# Patient Record
Sex: Male | Born: 1955 | Race: Black or African American | Hispanic: No | Marital: Single | Smoking: Former smoker
Health system: Southern US, Community
[De-identification: ages and names within clinical notes are randomized; demographics above are authoritative.]

## PROBLEM LIST (undated history)

## (undated) DIAGNOSIS — J45909 Unspecified asthma, uncomplicated: Secondary | ICD-10-CM

## (undated) DIAGNOSIS — D61818 Other pancytopenia: Secondary | ICD-10-CM

## (undated) DIAGNOSIS — H9190 Unspecified hearing loss, unspecified ear: Secondary | ICD-10-CM

## (undated) DIAGNOSIS — D869 Sarcoidosis, unspecified: Secondary | ICD-10-CM

## (undated) DIAGNOSIS — B029 Zoster without complications: Secondary | ICD-10-CM

## (undated) DIAGNOSIS — K219 Gastro-esophageal reflux disease without esophagitis: Secondary | ICD-10-CM

## (undated) DIAGNOSIS — F32A Depression, unspecified: Secondary | ICD-10-CM

## (undated) DIAGNOSIS — Z8669 Personal history of other diseases of the nervous system and sense organs: Secondary | ICD-10-CM

## (undated) DIAGNOSIS — D649 Anemia, unspecified: Secondary | ICD-10-CM

## (undated) DIAGNOSIS — E119 Type 2 diabetes mellitus without complications: Secondary | ICD-10-CM

## (undated) DIAGNOSIS — I739 Peripheral vascular disease, unspecified: Secondary | ICD-10-CM

## (undated) DIAGNOSIS — E782 Mixed hyperlipidemia: Secondary | ICD-10-CM

## (undated) DIAGNOSIS — Z8673 Personal history of transient ischemic attack (TIA), and cerebral infarction without residual deficits: Secondary | ICD-10-CM

## (undated) DIAGNOSIS — I10 Essential (primary) hypertension: Secondary | ICD-10-CM

## (undated) DIAGNOSIS — F329 Major depressive disorder, single episode, unspecified: Secondary | ICD-10-CM

## (undated) DIAGNOSIS — R569 Unspecified convulsions: Secondary | ICD-10-CM

## (undated) DIAGNOSIS — F419 Anxiety disorder, unspecified: Secondary | ICD-10-CM

## (undated) DIAGNOSIS — R0600 Dyspnea, unspecified: Secondary | ICD-10-CM

## (undated) DIAGNOSIS — I639 Cerebral infarction, unspecified: Secondary | ICD-10-CM

## (undated) HISTORY — PX: OTHER SURGICAL HISTORY: SHX169

## (undated) HISTORY — DX: Personal history of transient ischemic attack (TIA), and cerebral infarction without residual deficits: Z86.73

## (undated) HISTORY — DX: Essential (primary) hypertension: I10

## (undated) HISTORY — DX: Peripheral vascular disease, unspecified: I73.9

## (undated) HISTORY — DX: Gastro-esophageal reflux disease without esophagitis: K21.9

## (undated) HISTORY — DX: Sarcoidosis, unspecified: D86.9

## (undated) HISTORY — DX: Personal history of other diseases of the nervous system and sense organs: Z86.69

## (undated) HISTORY — DX: Other pancytopenia: D61.818

## (undated) HISTORY — DX: Mixed hyperlipidemia: E78.2

## (undated) HISTORY — DX: Type 2 diabetes mellitus without complications: E11.9

---
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
## 1983-02-22 HISTORY — PX: TIBIA FRACTURE SURGERY: SHX806

## 2000-02-19 ENCOUNTER — Encounter: Payer: Self-pay | Admitting: Emergency Medicine

## 2000-02-19 ENCOUNTER — Emergency Department (HOSPITAL_COMMUNITY): Admission: EM | Admit: 2000-02-19 | Discharge: 2000-02-19 | Payer: Self-pay | Admitting: Emergency Medicine

## 2009-02-21 HISTORY — PX: COLONOSCOPY: SHX174

## 2012-09-25 ENCOUNTER — Encounter: Payer: Self-pay | Admitting: Cardiovascular Disease

## 2012-09-25 DIAGNOSIS — R079 Chest pain, unspecified: Secondary | ICD-10-CM | POA: Diagnosis not present

## 2012-10-02 ENCOUNTER — Inpatient Hospital Stay (HOSPITAL_COMMUNITY): Payer: Medicare Other

## 2012-10-02 ENCOUNTER — Inpatient Hospital Stay (HOSPITAL_COMMUNITY)
Admission: AD | Admit: 2012-10-02 | Discharge: 2012-10-05 | DRG: 069 | Disposition: A | Discharge status: Home Health | Payer: Medicare Other | Source: Other Acute Inpatient Hospital | Attending: Internal Medicine | Admitting: Internal Medicine

## 2012-10-02 ENCOUNTER — Encounter (HOSPITAL_COMMUNITY): Payer: Self-pay | Admitting: Neurology

## 2012-10-02 DIAGNOSIS — Z7982 Long term (current) use of aspirin: Secondary | ICD-10-CM

## 2012-10-02 DIAGNOSIS — D869 Sarcoidosis, unspecified: Secondary | ICD-10-CM

## 2012-10-02 DIAGNOSIS — H913 Deaf nonspeaking, not elsewhere classified: Secondary | ICD-10-CM | POA: Diagnosis present

## 2012-10-02 DIAGNOSIS — E119 Type 2 diabetes mellitus without complications: Secondary | ICD-10-CM | POA: Diagnosis present

## 2012-10-02 DIAGNOSIS — G459 Transient cerebral ischemic attack, unspecified: Principal | ICD-10-CM | POA: Diagnosis present

## 2012-10-02 DIAGNOSIS — R209 Unspecified disturbances of skin sensation: Secondary | ICD-10-CM

## 2012-10-02 DIAGNOSIS — IMO0001 Reserved for inherently not codable concepts without codable children: Secondary | ICD-10-CM | POA: Diagnosis present

## 2012-10-02 DIAGNOSIS — I639 Cerebral infarction, unspecified: Secondary | ICD-10-CM | POA: Diagnosis present

## 2012-10-02 DIAGNOSIS — I6789 Other cerebrovascular disease: Secondary | ICD-10-CM

## 2012-10-02 DIAGNOSIS — E1165 Type 2 diabetes mellitus with hyperglycemia: Secondary | ICD-10-CM

## 2012-10-02 DIAGNOSIS — I6350 Cerebral infarction due to unspecified occlusion or stenosis of unspecified cerebral artery: Secondary | ICD-10-CM

## 2012-10-02 DIAGNOSIS — I10 Essential (primary) hypertension: Secondary | ICD-10-CM | POA: Diagnosis present

## 2012-10-02 DIAGNOSIS — Z79899 Other long term (current) drug therapy: Secondary | ICD-10-CM

## 2012-10-02 DIAGNOSIS — K219 Gastro-esophageal reflux disease without esophagitis: Secondary | ICD-10-CM | POA: Diagnosis present

## 2012-10-02 DIAGNOSIS — Z8673 Personal history of transient ischemic attack (TIA), and cerebral infarction without residual deficits: Secondary | ICD-10-CM

## 2012-10-02 DIAGNOSIS — IMO0002 Reserved for concepts with insufficient information to code with codable children: Secondary | ICD-10-CM

## 2012-10-02 DIAGNOSIS — I63239 Cerebral infarction due to unspecified occlusion or stenosis of unspecified carotid arteries: Secondary | ICD-10-CM

## 2012-10-02 HISTORY — DX: Cerebral infarction, unspecified: I63.9

## 2012-10-02 HISTORY — DX: Unspecified asthma, uncomplicated: J45.909

## 2012-10-02 LAB — CBC
Hemoglobin: 12.8 g/dL — ABNORMAL LOW (ref 13.0–17.0)
RBC: 4.26 MIL/uL (ref 4.22–5.81)
WBC: 7 10*3/uL (ref 4.0–10.5)

## 2012-10-02 LAB — CREATININE, SERUM
Creatinine, Ser: 1.09 mg/dL (ref 0.50–1.35)
GFR calc Af Amer: 85 mL/min — ABNORMAL LOW (ref >90)
GFR calc non Af Amer: 74 mL/min — ABNORMAL LOW (ref >90)

## 2012-10-02 LAB — GLUCOSE, CAPILLARY: Glucose-Capillary: 246 mg/dL — ABNORMAL HIGH (ref 70–99)

## 2012-10-02 LAB — TROPONIN I
Troponin I: <0.3 ng/mL (ref <0.30)
Troponin I: <0.3 ng/mL (ref <0.30)

## 2012-10-02 LAB — COMPREHENSIVE METABOLIC PANEL
ALT: 35 U/L (ref 0–53)
Albumin: 3.4 g/dL — ABNORMAL LOW (ref 3.5–5.2)
Alkaline Phosphatase: 75 U/L (ref 39–117)
Potassium: 3.6 mEq/L (ref 3.5–5.1)
Sodium: 144 mEq/L (ref 135–145)
Total Protein: 6.4 g/dL (ref 6.0–8.3)

## 2012-10-02 MED ORDER — INSULIN ASPART 100 UNIT/ML ~~LOC~~ SOLN
0.0000 [IU] | Freq: Three times a day (TID) | SUBCUTANEOUS | Status: DC
  Administered 2012-10-02: 3 [IU] via SUBCUTANEOUS
  Administered 2012-10-03: 1 [IU] via SUBCUTANEOUS
  Administered 2012-10-03: 2 [IU] via SUBCUTANEOUS
  Administered 2012-10-04 – 2012-10-05 (×2): 1 [IU] via SUBCUTANEOUS

## 2012-10-02 MED ORDER — ASPIRIN 325 MG PO TABS
325.0000 mg | ORAL_TABLET | Freq: Every day | ORAL | Status: DC
  Administered 2012-10-02 – 2012-10-05 (×4): 325 mg via ORAL
  Filled 2012-10-02 (×4): qty 1

## 2012-10-02 MED ORDER — ENOXAPARIN SODIUM 40 MG/0.4ML ~~LOC~~ SOLN
40.0000 mg | SUBCUTANEOUS | Status: DC
  Administered 2012-10-03 – 2012-10-05 (×3): 40 mg via SUBCUTANEOUS
  Filled 2012-10-02 (×5): qty 0.4

## 2012-10-02 MED ORDER — SENNOSIDES-DOCUSATE SODIUM 8.6-50 MG PO TABS: 1.0000 | ORAL_TABLET | Freq: Every evening | ORAL | Status: DC | PRN

## 2012-10-02 MED ORDER — ASPIRIN 300 MG RE SUPP
300.0000 mg | Freq: Every day | RECTAL | Status: DC
  Filled 2012-10-02 (×4): qty 1

## 2012-10-02 NOTE — Progress Notes (Signed)
Received the pt to room 4N24 from  [facility]. 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 Has weakness as well as numbness on left upper (flacid) and lower extremity from previous CVA. Vitals checked, informed about hospital protocol on fall prevention, oriented with call bell remote and room. Family at bed side. Will keep close monitoring. Danne Harbor

## 2012-10-02 NOTE — Consult Note (Signed)
Referring Physician: Susie Cassette    Chief Complaint: stroke  HPI:                                                                                                                                         Charles [name] is an 57 y.o. male treated in [place] in April for right hemispheric stroke. Family members do not have the medical records with them and are not versed in medical treatments thus cannot provide significant information. He was discharged on aspirin and Plavix and subsequently transferred to rehabilitation. He returned home and moved out to live with his mother in [place]. While at home one week ago he was complaining of left facial and arm numbness.  Mother states this is a "on and off thing". Patient was seen at [facility] on 8/5. At that the patient had a CT scan that showed chronic right MCA distribution changes with low attenuation in posterior aspect of the right frontal lobe and anterior right parietal lobe, suggestive of subacute ischemia. Patient also complained of some chest discomfort but denied any dizziness or lightheadedness.   Patient also has been having some left arm spasms which can be decreased by manual pressure or manipulation. In addition sensations of "shaking" in the back of his head".   As noted above, family is getting all the information from previous hospitalization as they are unsure what was done in April and feels there was a surgery presented to them in which a artery from "his brain could have been placed to reroute the blood flow".  Looking into records EC-IC bypass was mentioned but it was decided that this procedure would be reassessed in a out patient setting.  Per mother this was dicussed again with Dr. Jenkins Rouge and decided not to go this route. I have explained to them I am unsure of what they are talking about and look forward to seeing the noted from Dr. Jenkins Rouge of [facility] [place] tel: [phone], Fax: [phone]  Date last  known well: Unable to determine Time last known well: Unable to determine tPA Given: No: recent stroke  Past Medical History  Diagnosis Date  . Diabetes   . Hypertension   . Sarcoid   . Stroke     No past surgical history on file.  Family History  Problem Relation Age of Onset  . Diabetes Mother   . Diabetes Father   . Hypertension Mother   . Hypertension Father    Social History:  has no tobacco, alcohol, and drug history on file.  Allergies:  Allergies  Allergen Reactions  . Contrast Media (Iodinated Diagnostic Agents) Itching  . Shellfish Allergy Hives and Swelling    Medications:  Prior to Admission:  Prescriptions prior to admission  Medication Sig Dispense Refill  . acetaminophen (TYLENOL) 500 MG tablet Take 1,000 mg by mouth every 6 (six) hours as needed for pain.      [name] albuterol (PROVENTIL HFA;VENTOLIN HFA) 108 (90 BASE) MCG/ACT inhaler Inhale 2 puffs into the lungs every 6 (six) hours as needed for wheezing.      [name] aspirin EC 81 MG tablet Take 81 mg by mouth daily.      [name] atorvastatin (LIPITOR) 10 MG tablet Take 10 mg by mouth at bedtime.      . baclofen (LIORESAL) 10 MG tablet Take 10 mg by mouth daily.      . clopidogrel (PLAVIX) 75 MG tablet Take 75 mg by mouth daily.      . famotidine (PEPCID) 20 MG tablet Take 20 mg by mouth 2 (two) times daily.      . Fluticasone-Salmeterol (ADVAIR) 250-50 MCG/DOSE AEPB Inhale 1 puff into the lungs 2 (two) times daily.      [name] glipiZIDE (GLUCOTROL) 5 MG tablet Take 5-10 mg by mouth 2 (two) times daily before a meal. 10mg  in the morning before breakfast and 5mg  in the evening before dinner.      [name] lisinopril (PRINIVIL,ZESTRIL) 2.5 MG tablet Take 2.5 mg by mouth daily.      . polyethylene glycol powder (GLYCOLAX/MIRALAX) powder Take 17 g by mouth 2 (two) times daily.      . predniSONE (DELTASONE) 10 MG  tablet Take 10 mg by mouth daily.      . sertraline (ZOLOFT) 50 MG tablet Take 50 mg by mouth daily.       Scheduled: . aspirin  300 mg Rectal Daily   Or  . aspirin  325 mg Oral Daily  . enoxaparin (LOVENOX) injection  40 mg Subcutaneous Q24H  . insulin aspart  0-9 Units Subcutaneous TID WC    ROS:                                                                                                                                       History obtained from family  General ROS: negative for - chills, fatigue, fever, night sweats, weight gain or weight loss Psychological ROS: negative for - behavioral disorder, hallucinations, memory difficulties, mood swings or suicidal ideation Ophthalmic ROS: negative for - blurry vision, double vision, eye pain or loss of vision ENT ROS: negative for - epistaxis, nasal discharge, oral lesions, sore throat, tinnitus or vertigo Allergy and Immunology ROS: negative for - hives or itchy/watery eyes Hematological and Lymphatic ROS: negative for - bleeding problems, bruising or swollen lymph nodes Endocrine ROS: negative for - galactorrhea, hair pattern changes, polydipsia/polyuria or temperature intolerance Respiratory ROS: negative for - cough, hemoptysis, shortness of breath or wheezing Cardiovascular ROS: negative for - chest pain, dyspnea on exertion, edema or irregular heartbeat Gastrointestinal ROS: negative for - abdominal pain, diarrhea,  hematemesis, nausea/vomiting or stool incontinence Genito-Urinary ROS: negative for - dysuria, hematuria, incontinence or urinary frequency/urgency Musculoskeletal ROS: negative for - joint swelling or muscular weakness Neurological ROS: as noted in HPI Dermatological ROS: negative for rash and skin lesion changes  Neurologic Examination:                                                                                                      Blood pressure 144/72, pulse 62, temperature 98.1 F (36.7 C), temperature source  Oral, resp. rate 18.   Mental Status: Alert,  Speech dysarthric secondary to his underlying deafness.  Able to follow 3 step commands without difficulty. Cranial Nerves: II: Discs flat bilaterally; Visual fields grossly normal, pupils equal, round, reactive to light and accommodation III,IV, VI: ptosis not present, extra-ocular motions intact bilaterally V,VII: smile asymmetric on the left, facial light touch sensation decreased from V1-3 VIII: hearing --deaf IX,X: gag reflex present XI: right shoulder shrug intact XII: midline tongue extension Motor: Right arm and leg 5/5, left leg 4/5 proximal and 5/5 distal , left arm has 3/5 internal rotation and 1/5 distal.  Significant spasticity in arm and leg.  Tone and bulk:normal tone throughout; no atrophy noted Sensory: Pinprick and light touch decreased on the left arm and leg Deep Tendon Reflexes:  Right: Upper Extremity   Left: Upper extremity   biceps (C-5 to C-6) 2/4   biceps (C-5 to C-6) 2/4 tricep (C7) 2/4    triceps (C7) 3/4 Brachioradialis (C6) 2/4  Brachioradialis (C6) 3/4  Lower Extremity Lower Extremity  quadriceps (L-2 to L-4) 2/4   quadriceps (L-2 to L-4) 3/4 Achilles (S1) 2/4   Achilles (S1) 3/4  Plantars: Right: downgoing   Left: downgoing Cerebellar: normal finger-to-nose on the right,  normal heel-to-shin test on the right Gait: not tested CV: pulses palpable throughout    Results for orders placed during the hospital encounter of 10/02/12 (from the past 48 hour(s))  CBC     Status: Abnormal   Collection Time    10/02/12  8:10 AM      Result Value Range   WBC 7.0  4.0 - 10.5 K/uL   RBC 4.26  4.22 - 5.81 MIL/uL   Hemoglobin 12.8 (*) 13.0 - 17.0 g/dL   HCT 96.0 (*) 45.4 - 09.8 %   MCV 85.4  78.0 - 100.0 fL   MCH 30.0  26.0 - 34.0 pg   MCHC 35.2  30.0 - 36.0 g/dL   RDW 11.9  14.7 - 82.9 %   Platelets 107 (*) 150 - 400 K/uL   Comment: PLATELET COUNT CONFIRMED BY SMEAR  CREATININE, SERUM     Status: Abnormal    Collection Time    10/02/12  8:10 AM      Result Value Range   Creatinine, Ser 1.09  0.50 - 1.35 mg/dL   GFR calc non Af Amer 74 (*) >90 mL/min   GFR calc Af Amer 85 (*) >90 mL/min   Comment:            The eGFR has been calculated  using the CKD EPI equation.     This calculation has not been     validated in all clinical     situations.     eGFR's persistently     <90 mL/min signify     possible Chronic Kidney Disease.  COMPREHENSIVE METABOLIC PANEL     Status: Abnormal   Collection Time    10/02/12 10:30 AM      Result Value Range   Sodium 144  135 - 145 mEq/L   Potassium 3.6  3.5 - 5.1 mEq/L   Chloride 106  96 - 112 mEq/L   CO2 30  19 - 32 mEq/L   Glucose, Bld 111 (*) 70 - 99 mg/dL   BUN 14  6 - 23 mg/dL   Creatinine, Ser 4.13  0.50 - 1.35 mg/dL   Calcium 9.1  8.4 - 24.4 mg/dL   Total Protein 6.4  6.0 - 8.3 g/dL   Albumin 3.4 (*) 3.5 - 5.2 g/dL   AST 23  0 - 37 U/L   ALT 35  0 - 53 U/L   Alkaline Phosphatase 75  39 - 117 U/L   Total Bilirubin 0.4  0.3 - 1.2 mg/dL   GFR calc non Af Amer 69 (*) >90 mL/min   GFR calc Af Amer 80 (*) >90 mL/min   Comment:            The eGFR has been calculated     using the CKD EPI equation.     This calculation has not been     validated in all clinical     situations.     eGFR's persistently     <90 mL/min signify     possible Chronic Kidney Disease.  TROPONIN I     Status: None   Collection Time    10/02/12 10:30 AM      Result Value Range   Troponin I <0.30  <0.30 ng/mL   Comment:            Due to the release kinetics of cTnI,     a negative result within the first hours     of the onset of symptoms does not rule out     myocardial infarction with certainty.     If myocardial infarction is still suspected,     repeat the test at appropriate intervals.   Dg Chest Port 1 View  10/02/2012   *RADIOLOGY REPORT*  Clinical Data: Shortness of breath  PORTABLE CHEST - 1 VIEW  Comparison: None.  Findings: The cardiac shadow  is within normal limits.  Multiple calcified lymph nodes are noted within the hilum and mediastinum consistent with the given clinical history of sarcoidosis.  No focal infiltrate or sizable effusion is seen.  No bony abnormality is noted.  IMPRESSION: No acute abnormality is noted.   Original Report Authenticated By: Alcide Clever, M.D.   VASCULAR LAB  PRELIMINARY PRELIMINARY PRELIMINARY PRELIMINARY  Carotid duplex completed.  Preliminary report: Bilateral: 1-39% ICA stenosis. Vertebral artery flow is antegrade  2 D echo--pending  Assessment and plan discussed with with attending physician and they are in agreement.    Felicie Morn PA-C [facility] [phone]  10/02/2012, 2:51 PM   Assessment: 57 y.o. male with increased left-sided numbness in the setting of previous stroke. Possibilities include extension of his previous infarct, worsening of his old deficits and setting of physiological stressors. The episodes of tightening of his left arm are most likely muscle spasms given that  they seem to resolve with massage, however I would favor ruling out a seizure predisposition in this gentleman  Stroke Risk Factors - diabetes mellitus, hyperlipidemia, hypertension and stroke  1) MRI brain 2) EEG 3) urinalysis to look for a reason for worsening of his deficits 4) we'll continue to follow with you   Ritta Slot, MD [facility] [phone]  If 7pm- 7am, please page neurology on call at [phone].

## 2012-10-02 NOTE — Progress Notes (Signed)
VASCULAR LAB PRELIMINARY  PRELIMINARY  PRELIMINARY  PRELIMINARY  Carotid duplex completed.    Preliminary report:  Bilateral:  1-39% ICA stenosis.  Vertebral artery flow is antegrade.      Zahid Carneiro, RVT 10/02/2012, 2:12 PM

## 2012-10-02 NOTE — Progress Notes (Signed)
*  PRELIMINARY RESULTS* Echocardiogram 2D Echocardiogram has been performed.  Jeryl Columbia 10/02/2012, 2:37 PM

## 2012-10-02 NOTE — H&P (Signed)
[facility] History and Physical  Charles [name]:[id number] DOB: 03/10/1956 DOA: 10/02/2012  Referring physician:  PCP: No primary provider on file.   Chief Complaint: *  HPI:  57 year old male with a complicated medical history, sarcoidosis, diabetes mellitus non-insulin-dependent, including history of cerebrovascular accident on 05/2012. Treated in [place] he was living for several years. Discharged on aspirin and Plavix. He was subsequently seen in a hospital in [place] and from there he was transferred to rehabilitation. He returned home and moved out to live with his mother in [place].   Now transferred from [facility] for evaluation of left-sided weakness and numbness of his left arm. Patient presented to [facility] on 8/5. At that the patient had a CT scan that showed chronic right MCA distribution changes with low attenuation in posterior aspect of the right frontal lobe and anterior right parietal lobe, suggestive of subacute ischemia. Patient also complained of some chest discomfort but denied any dizziness or lightheadedness. According to the family the patient has had some dyspnea on exertion which his mother attributes to her sarcoidosis. Patient is chronically on prednisone. Apparently patient also complained of chest pain in [place] and a cardiology consultation was obtained, and cardiac enzymes were cycled and were found to be negative, d-dimer was negative. He also had an echocardiogram done [facility]. Of note is the patient is deaf and needs a sign language interpreter to communicate.      Review of Systems: negative for the following   Unable to obtain a full review of systems as the patient is deaf and dumb       No past medical history on file.   No past surgical history on file.    Social History:  has no tobacco, alcohol, and drug history on file.    Allergies not on file  No family history on file.   Prior to Admission  medications   Not on File     Physical Exam: Filed Vitals:   10/02/12 0822 10/02/12 1015  BP: 150/81 157/86  Pulse: 64 68  Temp: 98.3 F (36.8 C) 98 F (36.7 C)  TempSrc: Oral Oral  Resp: 18 18     Constitutional: Vital signs reviewed. Patient is a well-developed and well-nourished in no acute distress and cooperative with exam. Alert and oriented x3.  Head: Normocephalic and atraumatic  Ear: TM normal bilaterally  Mouth: no erythema or exudates, MMM  Eyes: PERRL, EOMI, conjunctivae normal, No scleral icterus.  Neck: Supple, Trachea midline normal ROM, No JVD, mass, thyromegaly, or carotid bruit present.  Cardiovascular: RRR, S1 normal, S2 normal, no MRG, pulses symmetric and intact bilaterally  Pulmonary/Chest: CTAB, no wheezes, rales, or rhonchi  Abdominal: Soft. Non-tender, non-distended, bowel sounds are normal, no masses, organomegaly, or guarding present.  GU: no CVA tenderness Musculoskeletal: No joint deformities, erythema, or stiffness, ROM full and no nontender Ext: no edema and no cyanosis, pulses palpable bilaterally (DP and PT)  Hematology: no cervical, inginal, or axillary adenopathy.  Neurological: A&O x1, , mute, decreased motor strength in the left side 15  cranial nerve II-XII are grossly intact, Skin: Warm, dry and intact. No rash, cyanosis, or clubbing.  Psychiatric: Normal mood and affect. speech and behavior is normal. Judgment and thought content normal. Cognition and memory are normal.       Labs on Admission:    Basic Metabolic Panel:  Recent Labs Lab 10/02/12 0810  CREATININE 1.09   Liver Function Tests: No results found for this basename: AST,  ALT, ALKPHOS, BILITOT, PROT, ALBUMIN,  in the last 168 hours No results found for this basename: LIPASE, AMYLASE,  in the last 168 hours No results found for this basename: AMMONIA,  in the last 168 hours CBC:  Recent Labs Lab 10/02/12 0810  WBC 7.0  HGB 12.8*  HCT 36.4*  MCV 85.4  PLT  107*   Cardiac Enzymes: No results found for this basename: CKTOTAL, CKMB, CKMBINDEX, TROPONINI,  in the last 168 hours  BNP (last 3 results) No results found for this basename: PROBNP,  in the last 8760 hours    CBG: No results found for this basename: GLUCAP,  in the last 168 hours  Radiological Exams on Admission: No results found.  EKG: Independently reviewed.   Assessment/Plan Active Problems:   * No active hospital problems. *   Recurrent CVA/TIA Continue aspirin and Plavix as well as statin MRI/MRA of the brain Neurology consulted   Sarcoidosis We'll obtain a chest x-ray Continue Advair Diskus and albuterol inhaler  Hypertension continue lisinopril  Diabetes we'll check a hemoglobin A1c and continue with sliding scale insulin   Code Status:   full Family Communication: bedside Disposition Plan: admit   Time spent: 70 mins   [facility] Pager [phone]  If 7PM-7AM, please contact night-coverage [email or web address] Password [facility] 10/02/2012, 10:25 AM

## 2012-10-02 NOTE — Progress Notes (Addendum)
Pt had a stable day, as pt is deaf we arranged sigh language interpreter to translate with doctors and for radiology techs for MRI. Have already set up interpreter for tomorow @ 10:30 am to be with doctors, neurologists are aware about the timing. Danne Harbor

## 2012-10-03 ENCOUNTER — Inpatient Hospital Stay (HOSPITAL_COMMUNITY): Payer: Medicare Other

## 2012-10-03 DIAGNOSIS — R209 Unspecified disturbances of skin sensation: Secondary | ICD-10-CM

## 2012-10-03 DIAGNOSIS — D869 Sarcoidosis, unspecified: Secondary | ICD-10-CM | POA: Diagnosis present

## 2012-10-03 DIAGNOSIS — E119 Type 2 diabetes mellitus without complications: Secondary | ICD-10-CM | POA: Diagnosis present

## 2012-10-03 DIAGNOSIS — I639 Cerebral infarction, unspecified: Secondary | ICD-10-CM | POA: Diagnosis present

## 2012-10-03 LAB — HEMOGLOBIN A1C
Hgb A1c MFr Bld: 7.3 % — ABNORMAL HIGH (ref <5.7)
Hgb A1c MFr Bld: 7.4 % — ABNORMAL HIGH (ref <5.7)

## 2012-10-03 LAB — CBC
HCT: 37.1 % — ABNORMAL LOW (ref 39.0–52.0)
Hemoglobin: 13.4 g/dL (ref 13.0–17.0)
MCH: 31.1 pg (ref 26.0–34.0)
MCHC: 36.1 g/dL — ABNORMAL HIGH (ref 30.0–36.0)
MCV: 86.1 fL (ref 78.0–100.0)
RDW: 13 % (ref 11.5–15.5)

## 2012-10-03 LAB — GLUCOSE, CAPILLARY
Glucose-Capillary: 94 mg/dL (ref 70–99)
Glucose-Capillary: 127 mg/dL — ABNORMAL HIGH (ref 70–99)
Glucose-Capillary: 183 mg/dL — ABNORMAL HIGH (ref 70–99)

## 2012-10-03 LAB — URINALYSIS, ROUTINE W REFLEX MICROSCOPIC
Glucose, UA: 100 mg/dL — AB
Hgb urine dipstick: NEGATIVE
Ketones, ur: NEGATIVE mg/dL
Protein, ur: NEGATIVE mg/dL
pH: 5.5 (ref 5.0–8.0)

## 2012-10-03 LAB — LIPID PANEL
Cholesterol: 174 mg/dL (ref 0–200)
HDL: 75 mg/dL (ref >39)
LDL Cholesterol: 70 mg/dL (ref 0–99)
Triglycerides: 143 mg/dL (ref <150)

## 2012-10-03 MED ORDER — TRAMADOL-ACETAMINOPHEN 37.5-325 MG PO TABS
2.0000 | ORAL_TABLET | Freq: Four times a day (QID) | ORAL | Status: DC | PRN
  Administered 2012-10-03 – 2012-10-04 (×2): 2 via ORAL
  Filled 2012-10-03 (×2): qty 2

## 2012-10-03 MED ORDER — SODIUM CHLORIDE 0.9 % IV SOLN
INTRAVENOUS | Status: DC
  Administered 2012-10-04: 01:00:00 via INTRAVENOUS

## 2012-10-03 NOTE — Evaluation (Signed)
Physical Therapy Evaluation Patient Details Name: Charles [name] MRN: [id number] DOB: [birth year] Today's Date: 10/03/2012 Time: [phone] (only charged for 45 min due to MDs needed to see pt ) PT Time Calculation (min): 78 min  PT Assessment / Plan / Recommendation History of Present Illness  57 y.o. male admitted to [facility] for increased left sided weakness, facial droop and dizziness.  Workup for possible stroke.  MRI (-) and symptoms resolving.  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    Clinical Impression  The pt seems pretty close to his pre admission baseline which is walking with one person assist and hemi walker at home.  He was active with all 3 therapies (Pt/OT/SLP) and I am recommending he resume those therapies at discharge.  His major concern is the back pain he has with mobility.  I believe it may be related to the unequal muscle activation in his low back and the altered gait pattern he has to be able to move his left leg forward, but since he has had a fall at home it may not be a bad idea to get further workup of the back to assess the origin of the pain.      PT Assessment  Patient needs continued PT services    Follow Up Recommendations  Home health PT;Supervision/Assistance - 24 hour    Does the patient have the potential to tolerate intense rehabilitation     Yes  Barriers to Discharge   None None    Equipment Recommendations  None recommended by PT    Recommendations for Other Services     Frequency Min 4X/week    Precautions / Restrictions Precautions Precautions: Fall Precaution Comments: L hemi peresis (old since stroke in April of this year) Required Braces or Orthoses: Other Brace/Splint Other Brace/Splint: L AFO   Pertinent Vitals/Pain Pt reports back pain only after gait.  DOE with gait 3/4 and O2 sats 98% on RA HR 112 bpm after  walking.  Pt got dizzy during treatment and had to take several seated rest breaks during activity BP was 160s/80s.       Mobility  Bed Mobility Bed Mobility: Not assessed (pt already OOB in chair.  ) Transfers Transfers: Sit to Stand;Stand to Sit Sit to Stand: 4: Min assist;With upper extremity assist;With armrests;From chair/3-in-1 Stand to Sit: 4: Min assist;With upper extremity assist;With armrests;To chair/3-in-1 Details for Transfer Assistance: min assist to support trunk for balance during transitional movements.   Ambulation/Gait Ambulation/Gait Assistance: 4: Min assist;3: Mod assist Ambulation Distance (Feet): 55 Feet Assistive device: Hemi-walker Ambulation/Gait Assistance Details: generally min assist for safety and balance during gait, but there was one instance of mod assist when pt caught his left toe on the floor an lurched forward requiring assist to prevent fall.   Gait Pattern: Step-to pattern;Decreased stride length;Decreased hip/knee flexion - left;Decreased dorsiflexion - left;Decreased weight shift to left;Left hip hike;Lateral trunk lean to right Gait velocity: less than 1.8 ft/sec indicating risk for recurrent falls General Gait Details: Pt at times kicking hemi walker with right foot.  Also, of note, during gait pt with increased DOE O2 sats 98%, HR 112 bpm.  Per pt report he has been more SOB during mobility since stroke.  H/o sarcoidosis which can also make him more prone to SOB.   Modified Rankin (Stroke Patients Only) Pre-Morbid  Rankin Score: Moderately severe disability Modified Rankin: Moderately severe disability        PT Diagnosis: Difficulty walking;Abnormality of gait;Generalized weakness;Acute pain;Hemiplegia non-dominant side  PT Problem List: Decreased strength;Decreased activity tolerance;Decreased balance;Decreased mobility;Decreased coordination;Cardiopulmonary status limiting activity;Pain PT Treatment Interventions: DME instruction;Gait  training;Functional mobility training;Therapeutic activities;Therapeutic exercise;Balance training;Neuromuscular re-education;Patient/family education;Modalities     PT Goals(Current goals can be found in the care plan section) Acute Rehab PT Goals Patient Stated Goal: to decrease his back pain PT Goal Formulation: With patient/family Time For Goal Achievement: 10/17/12 Potential to Achieve Goals: Good  Visit Information  Last PT Received On: 10/03/12 Assistance Needed: +1 PT/OT Co-Evaluation/Treatment: Yes History of Present Illness: 57 y.o. male admitted to [facility] for increased left sided weakness, facial droop and dizziness.  Workup for possible stroke.  MRI (-) and symptoms resolving.  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         Prior Functioning  Home Living Family/patient expects to be discharged to:: Private residence Living Arrangements: Other relatives;Other (Comment) (mom and brother) Available Help at Discharge: Family Type of Home: House Home Access: Ramped entrance Home Layout: One level Home Equipment: Other (comment);Wheelchair - manual (hemi walker, L foot AFO) Additional Comments: is active with HHPT/OT/SLP active currently Prior Function Level of Independence: Needs assistance Gait / Transfers Assistance Needed: assist for gait, can transfer mod I with hemi walker Communication Communication: Deaf;Other (comment) (sign language interpreter present)    Cognition  Cognition Arousal/Alertness: Awake/alert Behavior During Therapy: WFL for tasks assessed/performed Overall Cognitive Status: Impaired/Different from baseline Area of Impairment: Memory Memory: Decreased short-term memory General Comments: Brother reports some confusion when this first happend "he went back to the 88s"    Extremity/Trunk Assessment Upper Extremity  Assessment Upper Extremity Assessment: Defer to OT evaluation Lower Extremity Assessment Lower Extremity Assessment: LLE deficits/detail LLE Deficits / Details: strength 2+-3_/5 per gross functional assessment.   LLE Coordination: decreased fine motor;decreased gross motor Cervical / Trunk Assessment Cervical / Trunk Assessment: Other exceptions Cervical / Trunk Exceptions: imbalanced muscle activation during gait resulting in increased activation of his quadratus muscle on his left side to produce hip hiking and vaulting gait pattern.  Per pt since stroke in april he has had significant back pain with gait.  MD adressing.     Balance Balance Balance Assessed: Yes Static Sitting Balance Static Sitting - Balance Support: Right upper extremity supported;Feet supported Static Sitting - Level of Assistance: 6: Modified independent (Device/Increase time) Static Standing Balance Static Standing - Balance Support: Right upper extremity supported Static Standing - Level of Assistance: 5: Stand by assistance Dynamic Standing Balance Dynamic Standing - Balance Support: Right upper extremity supported Dynamic Standing - Level of Assistance: 4: Min assist;3: Mod assist Dynamic Standing - Balance Activities: Other (comment) (gait) Dynamic Standing - Comments: up to mod assist during dynamic tasks  End of Session PT - End of Session Equipment Utilized During Treatment: Gait belt Activity Tolerance: Patient limited by fatigue;Patient limited by pain;Other (comment) (pt also at times reporting dizziness with standing/leaning ) Patient left: in chair;with call bell/phone within reach;with family/visitor present (mom and brother)       Lurena Joiner B. Bartlomiej Jenkinson, PT, DPT [phone]   10/03/2012, 1:41 PM

## 2012-10-03 NOTE — Progress Notes (Signed)
EEG Completed; Results Pending  

## 2012-10-03 NOTE — Evaluation (Signed)
Speech Language Pathology Evaluation Patient Details Name: Charles [name] MRN: [id number] DOB: [birth year] Today's Date: 10/03/2012 Time: [id number] SLP Time Calculation (min): 34 min  Problem List: There are no active problems to display for this patient.  Past Medical History:  Past Medical History  Diagnosis Date  . Diabetes   . Hypertension   . Sarcoid   . Stroke    Past Surgical History: No past surgical history on file. HPI:  57 yo male adm to [facility] from [place] for cva symptoms.  PMH + for CVA in April 2014 resulting in LUE weakness, sarcoidosis, deafness since birth (mother states he has been in school since age 31).  Pt denies change in communication with last neuro event and family confirms (although family does not sign). Pt also states current communication is baseline.  Imaging showed right MCA ischemia and Remote moderate to large-size right hemispheric infarct with encephalomalacia involving right lobe, right temporal and right lenticular nucleus.  Pt currently resides with his mother in [place].  Speech/language evaluation as ordered.     Assessment / Plan / Recommendation Clinical Impression  Pt participated in limited evaluation as he had spent several hours this am working with MDs, PT,OT with interpreter and his meal arrived for lunch.  Via interpreter pt was able to communicate his needs, recall medical events and discuss his hearing aid usage etc.  He was able to follow conversation well approx 90% of the evaluation.  Approx 10% of the time, pt responses were not appropriate but he was able to be redirected by interpreter.  Pt also able to articulate/verbalize a few single words with good intelligiblity.   Skilled intervention included SLP providing communication board  (pt demonstrated ability to use it) to meet his needs - pt also stated he can tell the nurse when he needs to use the bathroom.  Discussion re: use of gestures and nodding for yes/no would allow easier  comunication with those who don't know sign.  Pt states communication level currently is the same as after his last cva- and his family concurs - (they do not sign).    He does not require speech therapy acutely, however resumption of services at home in his environment recommended.      SLP Assessment  All further Speech Lanaguage Pathology  needs can be addressed in the next venue of care    Follow Up Recommendations  Home health SLP    Frequency and Duration   n/a     Pertinent Vitals/Pain Afebrile, decreased   SLP Goals     SLP Evaluation Prior Functioning  Cognitive/Linguistic Baseline:  (family states pt is at baseline function) Type of Home: House  Lives With: Other (Comment) (mother) Available Help at Discharge: Family   Cognition  Overall Cognitive Status: Within Functional Limits for tasks assessed Attention: Selective Selective Attention: Appears intact Memory: Appears intact (pt recalled medical events) Awareness: Impaired Awareness Impairment: Intellectual impairment (pt aware of physical deficits) Problem Solving: Appears intact (pt asked SLP to open containers, cut food) Behaviors: Impulsive    Comprehension  Auditory Comprehension Overall Auditory Comprehension: Appears within functional limits for tasks assessed Commands: Within Functional Limits (for up to 2 step commands) Conversation: Complex Other Conversation Comments: pt able to have conversation re: his hearing aid not working for "a long time" and his lack of desire to use it Interfering Components: Media planner Discrimination: Within Function Limits (pt able to use comm board to point to items) Reading Comprehension Reading Status:  Not tested    Expression Expression Primary Mode of Expression: Verbal (nonverbal sign language) Verbal Expression Initiation: No impairment Naming: Impairment Responsive: Not tested Confrontation: Within functional limits (6/8  answered correctly) Pragmatics: No impairment Interfering Components:  (pt's deafness) Non-Verbal Means of Communication: Gestures;Communication board;Sign lanquage (ALS or other) Written Expression Dominant Hand: Right   Oral / Motor Oral Motor/Sensory Function Labial ROM: Within Functional Limits Labial Symmetry: Abnormal symmetry left Labial Strength: Within Functional Limits Lingual ROM: Within Functional Limits Lingual Symmetry: Within Functional Limits Lingual Strength: Within Functional Limits Facial ROM: Reduced right Facial Symmetry: Left droop Facial Sensation: Reduced (left) Velum: Within Functional Limits Mandible: Within Functional Limits Motor Speech Overall Motor Speech: Other (comment) (pt is deaf, but can articulate some words, WFL) Intelligibility: Intelligibility reduced (due to hearing loss only) Motor Planning: Witnin functional limits Motor Speech Errors: Not applicable Interfering Components: Premorbid status;Hearing loss   GO     Charles Burnet, MS [facility] SLP [phone]

## 2012-10-03 NOTE — Progress Notes (Signed)
Stroke Team Progress Note  HISTORY Charles [name] is an 57 y.o. male treated in [place] in April for right hemispheric stroke. Family members do not have the medical records with them and are not versed in medical treatments thus cannot provide significant information. He was discharged on aspirin and Plavix and subsequently transferred to rehabilitation. He returned home and moved out to live with his mother in [place]. While at home one week ago he was complaining of left facial and arm numbness. Mother states this is a "on and off thing". Patient was seen at [facility] on 8/5. At that the patient had a CT scan that showed chronic right MCA distribution changes with low attenuation in posterior aspect of the right frontal lobe and anterior right parietal lobe, suggestive of subacute ischemia. Patient also complained of some chest discomfort but denied any dizziness or lightheadedness.  Patient also has been having some left arm spasms which can be decreased by manual pressure or manipulation. In addition sensations of "shaking" in the back of his head".  As noted above, family is getting all the information from previous hospitalization as they are unsure what was done in April and feels there was a surgery presented to them in which a artery from "his brain could have been placed to reroute the blood flow". Looking into records EC-IC bypass was mentioned but it was decided that this procedure would be reassessed in a out patient setting. Per mother this was dicussed again with Dr. Jenkins Rouge and decided not to go this route. I have explained to them I am unsure of what they are talking about and look forward to seeing the noted from Dr. Jenkins Rouge of [facility] [place] tel: [phone], Fax: [phone]   Date last known well: Unable to determine  Time last known well: Unable to determine  tPA Given: No: recent stroke  He was admitted to the stroke unit for further  monitoring.   SUBJECTIVE  Patient in bedside chair with family. Signing interpreter. No new symptoms.   OBJECTIVE Most recent Vital Signs: Filed Vitals:   10/02/12 1905 10/02/12 2131 10/03/12 0200 10/03/12 0600  BP: 131/79 112/84 128/77 115/79  Pulse: 76 80 82 62  Temp: 98.3 F (36.8 C) 97.8 F (36.6 C) 97.6 F (36.4 C) 98.3 F (36.8 C)  TempSrc: Oral Oral Oral Oral  Resp: 18 18 18 18   SpO2:  99% 98% 97%   CBG (last 3)   Recent Labs  10/02/12 1650 10/02/12 2059 10/03/12 0626  GLUCAP 246* 118* 180*    IV Fluid Intake:     MEDICATIONS  . aspirin  300 mg Rectal Daily   Or  . aspirin  325 mg Oral Daily  . enoxaparin (LOVENOX) injection  40 mg Subcutaneous Q24H  . insulin aspart  0-9 Units Subcutaneous TID WC   PRN:  senna-docusate  Diet:  Carb Control Carb Control thin liquids Activity: --- DVT Prophylaxis: lovenox  CLINICALLY SIGNIFICANT STUDIES Basic Metabolic Panel:  Recent Labs Lab 10/02/12 0810 10/02/12 1030  NA  --  144  K  --  3.6  CL  --  106  CO2  --  30  GLUCOSE  --  111*  BUN  --  14  CREATININE 1.09 1.14  CALCIUM  --  9.1   Liver Function Tests:  Recent Labs Lab 10/02/12 1030  AST 23  ALT 35  ALKPHOS 75  BILITOT 0.4  PROT 6.4  ALBUMIN 3.4*   CBC:  Recent Labs Lab 10/02/12 0810 10/03/12 0610  WBC 7.0 5.8  HGB 12.8* 13.4  HCT 36.4* 37.1*  MCV 85.4 86.1  PLT 107* 107*   Coagulation: No results found for this basename: LABPROT, INR,  in the last 168 hours Cardiac Enzymes:  Recent Labs Lab 10/02/12 1030 10/02/12 1610 10/02/12 2227  TROPONINI <0.30 <0.30 <0.30   Urinalysis:  Recent Labs Lab 10/02/12 2337  COLORURINE YELLOW  LABSPEC 1.015  PHURINE 5.5  GLUCOSEU 100*  HGBUR NEGATIVE  BILIRUBINUR NEGATIVE  KETONESUR NEGATIVE  PROTEINUR NEGATIVE  UROBILINOGEN 0.2  NITRITE NEGATIVE  LEUKOCYTESUR NEGATIVE   Lipid Panel    Component Value Date/Time   CHOL 174 10/03/2012 0610   TRIG 143 10/03/2012 0610   HDL 75  10/03/2012 0610   CHOLHDL 2.3 10/03/2012 0610   VLDL 29 10/03/2012 0610   LDLCALC 70 10/03/2012 0610   HgbA1C  Lab Results  Component Value Date   HGBA1C 7.3* 10/02/2012    Urine Drug Screen:   No results found for this basename: labopia, cocainscrnur, labbenz, amphetmu, thcu, labbarb    Alcohol Level: No results found for this basename: ETH,  in the last 168 hours  Mr Brain Wo Contrast 10/02/2012   : No acute infarct.  Remote right hemispheric infarct and small vessel disease type changes as noted above.   Dg Chest Port 1 View 10/02/2012   : No acute abnormality is noted.    Mr Maxine Glenn Head/brain Wo Cm 10/02/2012   Abrupt cutoff of flow at the right carotid terminus with nonvisualization of right middle cerebral artery branch vessels consistent with the patient's remote right hemispheric infarct.  Fetal type origin of the posterior cerebral arteries with posterior cerebral artery supplied predominately from the anterior circulation.  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   2D Echocardiogram 60%, wall motion normal, LA normal size  Carotid Doppler  Preliminary report: Bilateral: 1-39% ICA stenosis. Vertebral artery flow is antegrade  CXR    EKG  .   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 Afebrile. Head is nontraumatic. Neck is supple without bruit.   Cardiac exam no murmur or gallop. Lungs are clear to auscultation. Distal pulses are well felt. Neurological Exam ; awake alert mental status exam limited due to patient understanding all the sign language. Extraocular movements are full range without nystagmus. Blinks to threat bilaterally. Fundi were not visualized. Vision acuity cannot be tested accurately. Mild left lower facial weakness. Tongue is midline. Motor system exam reveals spastic left hemiplegia with 0/5 left upper extremity and 3/5 left lower extremity  strength with weakness of the left ankle dorsiflexors. Left foot drop. Spasticity on the left. Tone is increased on the left. Deep tendon reflexes are exaggerated on the left. Left plantar is upgoing right is downgoing. Mild decreased sensation on the left hemibody. Gait was not tested. ASSESSMENT Mr. PEARL BERLINGER is a 57 y.o. male presenting with recurrent left hand/face numbness. Imaging confirms abrupt cutoff of flow at distal right MCA with nonvisualization of right middle cerebral artery branch vessels consistent with the patient's remote right hemispheric infarct.. Infarct felt to be embolic secondary to unknown source.  On aspirin 81 mg orally every day and clopidogrel 75 mg orally every day prior to admission. Now on aspirin 325 mg orally every day for secondary stroke prevention. Patient with resultant  left hemiparesis, numbness which is better per patient. Work up underway.   Diabetes melliltius , type 2, hgb a1c 7.3, goal < 6.5  Hypertension  Sarcoidosis  Previous RMCA stroke  Hospital day # 1  TREATMENT/PLAN  Continue aspirin 325 mg orally every day for secondary stroke prevention.  Risk factor modification TEE to look for embolic source. I have set up with [facility]. If positive for PFO (patent foramen ovale), check bilateral lower extremity venous dopplers to rule out DVT as possible source of stroke.   Dr. Pearlean Brownie spoke with patient (through sign language interpreter) and with family re: past hospitalizations. The family had a folder which included the past hospitalization's studies, notes which were reviewed with family. At this point, surgical intervention is not felt to be indicated due to completed stroke 05/2012; however with further symptoms, medical management   Gwendolyn Lima. Manson Passey, [facility], MBA, MHA [facility] Pager: [phone] 10/03/2012 3:46 PM  I have personally obtained a history, examined the patient, evaluated imaging results, and formulated the  assessment and plan of care. I agree with the above. Delia Heady, MD

## 2012-10-03 NOTE — Progress Notes (Signed)
[facility] PROGRESS NOTE  MARKUS CASTEN VWU:[id number] DOB: [birth year] DOA: 10/02/2012 PCP: No primary provider on file.   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 CT head an MRI brain showing remote rt hemispheric infarct with abrupt cutoff of flow at the right carotid terminus with nonvisualization of right middle cerebral artery branch vessels  .  Assessment/Plan: CVA No new acute infarct seen on imaging. Has residual left-sided weakness 2D echo unremarkable. Carotid doppler with <40 % bilateral ICA stenosis.   patient on baby ASA and plavix prior to admission. placed on full dose ASA here.  EEG pending  appreciate neurology recommendation. Recommend optimal medical management. Given likely embolic stroke recommend for TEE to rule out PFO and source of emboli. PT/OT eval Lipid panel within normal limit Exline hemoglobin A1c of 7.4.  Diabetes mellitus Continue on sliding scale insulin. Patient on metformin at home  Sarcoidosis Not on any medications. Chest x-ray shows multiple calcified lymph nodes within the hilum and mediastinum.  Code Status: Full code Family Communication: Mother and brother at bedside  Disposition Plan: Pending workup   Consultants:  Neurology  Cardiology for TEE  Procedures:   TEE tomorrow  Antibiotics:  None  HPI/Subjective: Patient seen and examined with the help of sign language interpreter  Objective: Filed Vitals:   10/03/12 1435  BP: 135/82  Pulse: 80  Temp: 98.6 F (37 C)  Resp: 18    Intake/Output Summary (Last 24 hours) at 10/03/12 1630 Last data filed at 10/03/12 1318  Gross per 24 hour  Intake    480 ml  Output    200 ml  Net    280 ml   There were no vitals filed  for this visit.  Exam:   General:  Middle aged male in no acute distress. Deaf and mute  HEENT: no pallor, rt facial droop. , Moist oral mucosa  Chest: Clear to auscultation bilaterally, no added sounds  CVS: Normal S1 and S2, no murmurs rub or gallop  Abdomen: Soft, nontender, nondistended, bowel sounds present  Extremities: Warm, no edema  CNS: AAO x3, right hemiparesis     Data Reviewed: Basic Metabolic Panel:  Recent Labs Lab 10/02/12 0810 10/02/12 1030  NA  --  144  K  --  3.6  CL  --  106  CO2  --  30  GLUCOSE  --  111*  BUN  --  14  CREATININE 1.09 1.14  CALCIUM  --  9.1   Liver Function Tests:  Recent Labs Lab 10/02/12 1030  AST 23  ALT 35  ALKPHOS 75  BILITOT 0.4  PROT 6.4  ALBUMIN 3.4*   No results found for this basename: LIPASE, AMYLASE,  in the last 168 hours No results found for this basename: AMMONIA,  in the last 168 hours CBC:  Recent Labs Lab 10/02/12 0810 10/03/12 0610  WBC 7.0 5.8  HGB 12.8* 13.4  HCT 36.4* 37.1*  MCV 85.4 86.1  PLT 107* 107*   Cardiac Enzymes:  Recent Labs Lab 10/02/12 1030 10/02/12 1610 10/02/12 2227  TROPONINI <0.30 <0.30 <0.30   BNP (last 3 results) No results found for this basename: PROBNP,  in the last 8760 hours CBG:  Recent Labs Lab 10/02/12 1650 10/02/12 2059 10/03/12  1191 10/03/12 1133  GLUCAP 246* 118* 180* 94    No results found for this or any previous visit (from the past 240 hour(s)).   Studies: Mr Sherrin Daisy Contrast  10/02/2012   *RADIOLOGY REPORT*  Clinical Data:  Prior stroke.  Presents with left-sided weakness and numbness.  Hypertensive diabetic patient with sarcoidosis.  MRI BRAIN WITHOUT CONTRAST MRA HEAD WITHOUT CONTRAST  Technique: Multiplanar, multiecho pulse sequences of the brain and surrounding structures were obtained according to standard protocol without intravenous contrast.  Angiographic images of the head were obtained using MRA technique without contrast.   Comparison: None.  MRI HEAD  Findings:  No acute infarct.  Remote moderate to large-size right hemispheric infarct with encephalomalacia involving portions of the right frontal lobe, right temporal lobe and right lenticular nucleus. Subsequent dilation right lateral ventricle. Prominent right hemispheric extra- axial space may be related to prior infarct and / or cystic hygroma.  Moderate white matter type changes may represent result of small vessel disease in this diabetic hypertensive patient.  White matter type changes secondary to sarcoidosis not excluded as contributing to this finding.  Some of the white matter type changes within the right cerebral peduncle and pons felt to be related to the remote right hemispheric infarct (Wallerian degeneration but without significant atrophy).  No intracranial hemorrhage.  Atrophy without hydrocephalus.  No intracranial mass lesion detected on this unenhanced exam.  C3-4 bulge/protrusion with mild spinal stenosis and slight cord flattening.  Abnormal appearance of the vertebral arteries and basilar artery. Please see below.  IMPRESSION: No acute infarct.  Remote right hemispheric infarct and small vessel disease type changes as noted above.  MRA HEAD  Findings: Abrupt cutoff of flow at the right carotid terminus with nonvisualization of right middle cerebral artery branch vessels consistent with the patient's remote right hemispheric infarct.  Mild irregularity and slight narrowing of the supraclinoid segment of the right internal carotid artery.  Ectatic left internal carotid artery most notable cavernous segment supraclinoid segment.  Left middle cerebral artery mild branch vessel irregularity.  Fetal type origin of the posterior cerebral arteries with posterior cerebral artery supplied predominately from the anterior circulation.  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  Significantly attenuated and narrowed  right vertebral artery and right  PICA.  IMPRESSION: Abrupt cutoff of flow at the right carotid terminus with nonvisualization of right middle cerebral artery branch vessels consistent with the patient's remote right hemispheric infarct.  Fetal type origin of the posterior cerebral arteries with posterior cerebral artery supplied predominately from the anterior circulation.  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  Significantly attenuated and narrowed right vertebral artery and right PICA.   Original Report Authenticated By: Lacy Duverney, M.D.   Dg Chest Port 1 View  10/02/2012   *RADIOLOGY REPORT*  Clinical Data: Shortness of breath  PORTABLE CHEST - 1 VIEW  Comparison: None.  Findings: The cardiac shadow is within normal limits.  Multiple calcified lymph nodes are noted within the hilum and mediastinum consistent with the given clinical history of sarcoidosis.  No focal infiltrate or sizable effusion is seen.  No bony abnormality is noted.  IMPRESSION: No acute abnormality is noted.   Original Report Authenticated By: Alcide Clever, M.D.   Mr Mra Head/brain Wo Cm  10/02/2012   *RADIOLOGY REPORT*  Clinical Data:  Prior stroke.  Presents with left-sided weakness and numbness.  Hypertensive diabetic patient with sarcoidosis.  MRI BRAIN WITHOUT CONTRAST MRA  HEAD WITHOUT CONTRAST  Technique: Multiplanar, multiecho pulse sequences of the brain and surrounding structures were obtained according to standard protocol without intravenous contrast.  Angiographic images of the head were obtained using MRA technique without contrast.  Comparison: None.  MRI HEAD  Findings:  No acute infarct.  Remote moderate to large-size right hemispheric infarct with encephalomalacia involving portions of the right frontal lobe, right temporal lobe and right lenticular nucleus. Subsequent dilation right lateral ventricle. Prominent right hemispheric extra- axial space may be related to prior infarct and / or cystic hygroma.  Moderate white matter  type changes may represent result of small vessel disease in this diabetic hypertensive patient.  White matter type changes secondary to sarcoidosis not excluded as contributing to this finding.  Some of the white matter type changes within the right cerebral peduncle and pons felt to be related to the remote right hemispheric infarct (Wallerian degeneration but without significant atrophy).  No intracranial hemorrhage.  Atrophy without hydrocephalus.  No intracranial mass lesion detected on this unenhanced exam.  C3-4 bulge/protrusion with mild spinal stenosis and slight cord flattening.  Abnormal appearance of the vertebral arteries and basilar artery. Please see below.  IMPRESSION: No acute infarct.  Remote right hemispheric infarct and small vessel disease type changes as noted above.  MRA HEAD  Findings: Abrupt cutoff of flow at the right carotid terminus with nonvisualization of right middle cerebral artery branch vessels consistent with the patient's remote right hemispheric infarct.  Mild irregularity and slight narrowing of the supraclinoid segment of the right internal carotid artery.  Ectatic left internal carotid artery most notable cavernous segment supraclinoid segment.  Left middle cerebral artery mild branch vessel irregularity.  Fetal type origin of the posterior cerebral arteries with posterior cerebral artery supplied predominately from the anterior circulation.  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  Significantly attenuated and narrowed  right vertebral artery and right PICA.  IMPRESSION: Abrupt cutoff of flow at the right carotid terminus with nonvisualization of right middle cerebral artery branch vessels consistent with the patient's remote right hemispheric infarct.  Fetal type origin of the posterior cerebral arteries with posterior cerebral artery supplied predominately from the anterior circulation.  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   Significantly attenuated and narrowed right vertebral artery and right PICA.   Original Report Authenticated By: Lacy Duverney, M.D.    Scheduled Meds: . aspirin  300 mg Rectal Daily   Or  . aspirin  325 mg Oral Daily  . enoxaparin (LOVENOX) injection  40 mg Subcutaneous Q24H  . insulin aspart  0-9 Units Subcutaneous TID WC   Continuous Infusions:     Time spent: 25 minutes    Lyan Moyano  [facility] Pager [phone] If 7PM-7AM, please contact night-coverage at [email or web address], password [email or web address] 10/03/2012, 4:30 PM  LOS: 1 day

## 2012-10-03 NOTE — Progress Notes (Signed)
Patient requested pain medication. Dr. Gonzella Lex notified. Order received. Will continue to monitor.  Sim Boast, RN

## 2012-10-03 NOTE — Progress Notes (Signed)
   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  CM FOLLOWING FOR DCP   Anticipated DC Date:  10/10/2012   Anticipated DC Plan: POSSIBLY HOME W HOME HEALTH, AWAITING FOR PT/OT EVALS      DC Planning Services  CM consult            Status of service:  In process, will continue to follow Medicare Important Message given?  NA - LOS <3 / Initial given by admissions (If response is "NO", the following Medicare IM given date fields will be blank)  Per UR Regulation:  Reviewed for med. necessity/level of care/duration of stay  Comments:  10/03/2012- B Skyrah Krupp RN,BSN,MHA

## 2012-10-03 NOTE — Progress Notes (Signed)
Bedside intepreter called for testing. Number called [phone] and left message. Family aware for the delay of testing until intepreter arrive. Will continue to monitor.  Sim Boast, RN

## 2012-10-03 NOTE — Evaluation (Signed)
Occupational Therapy Evaluation Patient Details Name: NIJEE HEATWOLE MRN: [id number] DOB: 23-Mar-1955 Today's Date: 10/03/2012 Time: 4098-1191 OT Time Calculation (min): 72 min  OT Assessment / Plan / Recommendation History of present illness 57 y.o. male admitted to [facility] for increased left sided weakness, facial droop and dizziness.  Workup for possible stroke.  MRI (-) and symptoms resolving.  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     Clinical Impression   PT admitted with new onset of facial numbness. Pt currently with functional limitiations due to the deficits listed below (see OT problem list). Pt demonstrates ability to hand write needs. Pt's mother insist that patient have interrupter present for all therapy just like at home. Mother very concerned with verbalizations however patient demonstrates baseline verbalizations during session. Pt will benefit from skilled OT to increase their independence and safety with adls and balance to allow discharge HHOT.     OT Assessment  Patient needs continued OT Services    Follow Up Recommendations  Home health OT;Supervision/Assistance - 24 hour    Barriers to Discharge      Equipment Recommendations  None recommended by OT    Recommendations for Other Services    Frequency  Min 2X/week    Precautions / Restrictions Precautions Precautions: Fall Precaution Comments: L hemi peresis (old since stroke in April of this year) Required Braces or Orthoses: Other Brace/Splint Other Brace/Splint: L AFO   Pertinent Vitals/Pain Back pain     ADL  Grooming: Wash/dry face;Supervision/safety Where Assessed - Grooming: Unsupported standing Lower Body Dressing: Minimal assistance Where Assessed - Lower Body Dressing: Unsupported sitting (don shoes with brace) Toilet Transfer: Minimal assistance Toilet Transfer  Method: Sit to stand Toilet Transfer Equipment: Regular height toilet (standing) Equipment Used: Gait belt;Cane Transfers/Ambulation Related to ADLs: Pt ambulating with min to ming uard (A). Family reports he is at baseline. Pt reports incr pain with ambulation ADL Comments: Pt is at baseline for adls. Ot to continue to visit due to back pain during session and dizziness. Pt with LOB x2 due to dizziness. BP stable during session    OT Diagnosis: Generalized weakness;Hemiplegia non-dominant side  OT Problem List: Decreased strength;Decreased activity tolerance;Impaired balance (sitting and/or standing);Decreased safety awareness;Decreased knowledge of use of DME or AE;Decreased knowledge of precautions;Impaired UE functional use OT Treatment Interventions: Self-care/ADL training;Therapeutic exercise;Neuromuscular education;DME and/or AE instruction;Therapeutic activities;Patient/family education;Balance training   OT Goals(Current goals can be found in the care plan section) Acute Rehab OT Goals Patient Stated Goal: to decrease his back pain OT Goal Formulation: With patient/family Time For Goal Achievement: 10/17/12 Potential to Achieve Goals: Good  Visit Information  Last OT Received On: 10/03/12 Assistance Needed: +1 History of Present Illness: 57 y.o. male admitted to [facility] for increased left sided weakness, facial droop and dizziness.  Workup for possible stroke.  MRI (-) and symptoms resolving.  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         Prior Functioning     Home Living Family/patient expects to be discharged to:: Private residence Living Arrangements: Other relatives;Other (Comment) (mom and brother) Available Help at Discharge: Family Type of Home: House Home Access: Ramped entrance Home Layout: One level Home Equipment: Other  (  comment);Wheelchair - manual (hemi walker, L foot AFO) Additional Comments: is active with HHPT/OT/SLP active currently  Lives With: Other (Comment) (mother) Prior Function Level of Independence: Needs assistance Gait / Transfers Assistance Needed: assist for gait, can transfer mod I with hemi walker Communication Communication: Deaf;Other (comment) (sign language interpreter present) Dominant Hand: Right         Vision/Perception Vision - History Baseline Vision: Wears glasses only for reading   Cognition  Cognition Arousal/Alertness: Awake/alert Behavior During Therapy: WFL for tasks assessed/performed Overall Cognitive Status: Impaired/Different from baseline Area of Impairment: Memory Memory: Decreased short-term memory General Comments: Brother reports some confusion when this first happend "he went back to the 83s"    Extremity/Trunk Assessment Upper Extremity Assessment Upper Extremity Assessment: LUE deficits/detail LUE Deficits / Details: Lt UE flaccid, no arom of digits, incr tone noted. MD sethi to address possible need for botox LUE Coordination: decreased fine motor;decreased gross motor Lower Extremity Assessment LLE Deficits / Details: strength 2+-3_/5 per gross functional assessment.   LLE Coordination: decreased fine motor;decreased gross motor Cervical / Trunk Assessment Cervical / Trunk Assessment: Other exceptions Cervical / Trunk Exceptions: imbalanced muscle activation during gait resulting in increased activation of his quadratus muscle on his left side to produce hip hiking and vaulting gait pattern.  Per pt since stroke in april he has had significant back pain with gait.  MD adressing.       Mobility Bed Mobility Bed Mobility: Not assessed (pt already OOB in chair.  ) Transfers Sit to Stand: 4: Min assist;With upper extremity assist;With armrests;From chair/3-in-1 Stand to Sit: 4: Min assist;With upper extremity assist;With armrests;To  chair/3-in-1 Details for Transfer Assistance: min assist to support trunk for balance during transitional movements.       Exercise     Balance Balance Balance Assessed: Yes Static Sitting Balance Static Sitting - Balance Support: Right upper extremity supported;Feet supported Static Sitting - Level of Assistance: 6: Modified independent (Device/Increase time) Static Standing Balance Static Standing - Balance Support: Right upper extremity supported Static Standing - Level of Assistance: 5: Stand by assistance Dynamic Standing Balance Dynamic Standing - Balance Support: Right upper extremity supported Dynamic Standing - Level of Assistance: 4: Min assist;3: Mod assist Dynamic Standing - Balance Activities: Other (comment) (gait)   End of Session OT - End of Session Activity Tolerance: Patient tolerated treatment well Patient left: in chair;with call bell/phone within reach;with family/visitor present Nurse Communication: Mobility status;Precautions  GO     Lucile Shutters 10/03/2012, 4:44 PM  Pager: [phone]

## 2012-10-04 ENCOUNTER — Ambulatory Visit: Payer: Medicare Other | Admitting: Neurology

## 2012-10-04 ENCOUNTER — Encounter (HOSPITAL_COMMUNITY): Payer: Self-pay | Admitting: *Deleted

## 2012-10-04 ENCOUNTER — Encounter (HOSPITAL_COMMUNITY)
Admission: AD | Disposition: A | Discharge status: Home Health | Payer: Self-pay | Source: Other Acute Inpatient Hospital | Attending: Internal Medicine

## 2012-10-04 DIAGNOSIS — D869 Sarcoidosis, unspecified: Secondary | ICD-10-CM

## 2012-10-04 DIAGNOSIS — I6789 Other cerebrovascular disease: Secondary | ICD-10-CM | POA: Diagnosis not present

## 2012-10-04 DIAGNOSIS — I6350 Cerebral infarction due to unspecified occlusion or stenosis of unspecified cerebral artery: Secondary | ICD-10-CM

## 2012-10-04 HISTORY — PX: TEE WITHOUT CARDIOVERSION: SHX5443

## 2012-10-04 LAB — RPR: RPR Ser Ql: NONREACTIVE

## 2012-10-04 LAB — ANTITHROMBIN III: AntiThromb III Func: 115 % (ref 75–120)

## 2012-10-04 LAB — CBC
Hemoglobin: 13.4 g/dL (ref 13.0–17.0)
Platelets: 100 10*3/uL — ABNORMAL LOW (ref 150–400)
RBC: 4.4 MIL/uL (ref 4.22–5.81)
WBC: 5.1 10*3/uL (ref 4.0–10.5)

## 2012-10-04 LAB — GLUCOSE, CAPILLARY
Glucose-Capillary: 144 mg/dL — ABNORMAL HIGH (ref 70–99)
Glucose-Capillary: 111 mg/dL — ABNORMAL HIGH (ref 70–99)
Glucose-Capillary: 111 mg/dL — ABNORMAL HIGH (ref 70–99)

## 2012-10-04 LAB — HOMOCYSTEINE: Homocysteine: 12.3 umol/L (ref 4.0–15.4)

## 2012-10-04 SURGERY — ECHOCARDIOGRAM, TRANSESOPHAGEAL
Anesthesia: Moderate Sedation

## 2012-10-04 MED ORDER — BUTAMBEN-TETRACAINE-BENZOCAINE 2-2-14 % EX AERO
INHALATION_SPRAY | CUTANEOUS | Status: DC | PRN
  Administered 2012-10-04: 2 via TOPICAL

## 2012-10-04 MED ORDER — ALBUTEROL SULFATE HFA 108 (90 BASE) MCG/ACT IN AERS
2.0000 | INHALATION_SPRAY | Freq: Four times a day (QID) | RESPIRATORY_TRACT | Status: DC | PRN
  Filled 2012-10-04: qty 6.7

## 2012-10-04 MED ORDER — PROMETHAZINE HCL 25 MG/ML IJ SOLN
12.5000 mg | Freq: Four times a day (QID) | INTRAMUSCULAR | Status: DC | PRN
  Administered 2012-10-04: 12.5 mg via INTRAVENOUS
  Filled 2012-10-04 (×2): qty 1

## 2012-10-04 MED ORDER — MIDAZOLAM HCL 10 MG/2ML IJ SOLN
INTRAMUSCULAR | Status: DC | PRN
  Administered 2012-10-04: 2 mg via INTRAVENOUS
  Administered 2012-10-04: 1 mg via INTRAVENOUS

## 2012-10-04 MED ORDER — FENTANYL CITRATE 0.05 MG/ML IJ SOLN
INTRAMUSCULAR | Status: DC | PRN
  Administered 2012-10-04: 50 ug via INTRAVENOUS

## 2012-10-04 NOTE — Progress Notes (Signed)
[facility] PROGRESS NOTE  Charles [name] ZOX:[id number] DOB: 1955/08/21 DOA: 10/02/2012 PCP: No primary provider on file.  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 CT head an MRI brain showing remote rt hemispheric infarct with abrupt cutoff of flow at the right carotid terminus with nonvisualization of right middle cerebral artery branch vessels.  .  Assessment/Plan:  Hx of CVA  No new acute infarct seen on imaging.  Has residual left-sided weakness ( rt UE hemiplegia and LLE hemiparesis) 2D echo unremarkable. Carotid doppler with <40 % bilateral ICA stenosis.  patient on baby ASA and plavix prior to admission. placed on full dose ASA here.  EEG shows no epileptic activity. appreciate neurology recommendation. Recommend optimal medical management. Given likely embolic stroke recommend for TEE to rule out PFO and source of emboli. TEE doen today unremarkable for PFO or thrombi. PT/OT eval  Lipid panel within normal limit. hemoglobin A1c of 7.4.   Diabetes mellitus  Continue on sliding scale insulin. Patient on metformin at home   Sarcoidosis  Not on any medications. Chest x-ray shows multiple calcified lymph nodes within the hilum and mediastinum. o2 sat stable on RA. Needs to establish care with pulmonary as outpt. Will order albuterol inhalers.  Code Status: Full code  Family Communication: none at bedside  Disposition Plan: possibly home tomorrow with services  Consultants:  Neurology  Cardiology for TEE  Procedures:  TEE on 8/14  Antibiotics:  None   HPI/Subjective: Patient seen after TEE. No family in the room. Discussed in writing. Denies further weakness or numbness  Objective: Filed Vitals:   10/04/12 1515  BP:  161/114  Pulse: 83  Temp:   Resp: 7    Intake/Output Summary (Last 24 hours) at 10/04/12 1657 Last data filed at 10/04/12 1020  Gross per 24 hour  Intake 335.83 ml  Output   1050 ml  Net -714.17 ml   Filed Weights   10/04/12 1350  Weight: 75.297 kg (166 lb)    Exam:  General: Middle aged male in no acute distress. Deaf and mute  HEENT: no pallor, rt facial droop. , Moist oral mucosa  Chest: Clear to auscultation bilaterally, no added sounds  CVS: Normal S1 and S2, no murmurs rub or gallop  Abdomen: Soft, nontender, nondistended, bowel sounds present  Extremities: Warm, no edema  CNS: AAO x3, left UE  Hemiplegia, 4/5 power over LLE.  Data Reviewed: Basic Metabolic Panel:  Recent Labs Lab 10/02/12 0810 10/02/12 1030  NA  --  144  K  --  3.6  CL  --  106  CO2  --  30  GLUCOSE  --  111*  BUN  --  14  CREATININE 1.09 1.14  CALCIUM  --  9.1   Liver Function Tests:  Recent Labs Lab 10/02/12 1030  AST 23  ALT 35  ALKPHOS 75  BILITOT 0.4  PROT 6.4  ALBUMIN 3.4*   No results found for this basename: LIPASE, AMYLASE,  in the last 168 hours No results found for this basename: AMMONIA,  in the last 168 hours CBC:  Recent Labs Lab 10/02/12 0810 10/03/12 0610 10/04/12 0535  WBC 7.0 5.8 5.1  HGB 12.8* 13.4 13.4  HCT 36.4* 37.1* 38.0*  MCV 85.4 86.1 86.4  PLT 107* 107* 100*   Cardiac Enzymes:  Recent Labs Lab 10/02/12 1030 10/02/12 1610 10/02/12 2227  TROPONINI <0.30 <0.30 <0.30   BNP (last 3 results) No results found for this basename: PROBNP,  in the last 8760 hours CBG:  Recent Labs Lab 10/03/12 2100 10/04/12 0642 10/04/12 1202 10/04/12 1455 10/04/12 1627  GLUCAP 183* 144* 111* 96 111*    No results found for this or any previous visit (from the past 240 hour(s)).   Studies: Mr Sherrin Daisy Contrast  10/02/2012   *RADIOLOGY REPORT*  Clinical Data:  Prior stroke.  Presents with left-sided weakness and numbness.  Hypertensive diabetic  patient with sarcoidosis.  MRI BRAIN WITHOUT CONTRAST MRA HEAD WITHOUT CONTRAST  Technique: Multiplanar, multiecho pulse sequences of the brain and surrounding structures were obtained according to standard protocol without intravenous contrast.  Angiographic images of the head were obtained using MRA technique without contrast.  Comparison: None.  MRI HEAD  Findings:  No acute infarct.  Remote moderate to large-size right hemispheric infarct with encephalomalacia involving portions of the right frontal lobe, right temporal lobe and right lenticular nucleus. Subsequent dilation right lateral ventricle. Prominent right hemispheric extra- axial space may be related to prior infarct and / or cystic hygroma.  Moderate white matter type changes may represent result of small vessel disease in this diabetic hypertensive patient.  White matter type changes secondary to sarcoidosis not excluded as contributing to this finding.  Some of the white matter type changes within the right cerebral peduncle and pons felt to be related to the remote right hemispheric infarct (Wallerian degeneration but without significant atrophy).  No intracranial hemorrhage.  Atrophy without hydrocephalus.  No intracranial mass lesion detected on this unenhanced exam.  C3-4 bulge/protrusion with mild spinal stenosis and slight cord flattening.  Abnormal appearance of the vertebral arteries and basilar artery. Please see below.  IMPRESSION: No acute infarct.  Remote right hemispheric infarct and small vessel disease type changes as noted above.  MRA HEAD  Findings: Abrupt cutoff of flow at the right carotid terminus with nonvisualization of right middle cerebral artery branch vessels consistent with the patient's remote right hemispheric infarct.  Mild irregularity and slight narrowing of the supraclinoid segment of the right internal carotid artery.  Ectatic left internal carotid artery most notable cavernous segment supraclinoid segment.  Left  middle cerebral artery mild branch vessel irregularity.  Fetal type origin of the posterior cerebral arteries with posterior cerebral artery supplied predominately from the anterior circulation.  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  Significantly attenuated and narrowed  right vertebral artery and right PICA.  IMPRESSION: Abrupt cutoff of flow at the right carotid terminus with nonvisualization of right middle cerebral artery branch vessels consistent with the patient's remote right hemispheric infarct.  Fetal type origin of the posterior cerebral arteries with posterior cerebral artery supplied predominately from the anterior circulation.  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  Significantly attenuated and narrowed right vertebral artery and right PICA.   Original Report Authenticated By: Lacy Duverney, M.D.   Mr Mra Head/brain Wo Cm  10/02/2012   *RADIOLOGY REPORT*  Clinical Data:  Prior stroke.  Presents with left-sided weakness and numbness.  Hypertensive diabetic patient with sarcoidosis.  MRI BRAIN WITHOUT CONTRAST MRA HEAD WITHOUT CONTRAST  Technique: Multiplanar, multiecho pulse sequences of the brain and surrounding structures were obtained according to standard protocol without intravenous contrast.  Angiographic images of the head were obtained using  MRA technique without contrast.  Comparison: None.  MRI HEAD  Findings:  No acute infarct.  Remote moderate to large-size right hemispheric infarct with encephalomalacia involving portions of the right frontal lobe, right temporal lobe and right lenticular nucleus. Subsequent dilation right lateral ventricle. Prominent right hemispheric extra- axial space may be related to prior infarct and / or cystic hygroma.  Moderate white matter type changes may represent result of small vessel disease in this diabetic hypertensive patient.  White matter type changes secondary to sarcoidosis not excluded as  contributing to this finding.  Some of the white matter type changes within the right cerebral peduncle and pons felt to be related to the remote right hemispheric infarct (Wallerian degeneration but without significant atrophy).  No intracranial hemorrhage.  Atrophy without hydrocephalus.  No intracranial mass lesion detected on this unenhanced exam.  C3-4 bulge/protrusion with mild spinal stenosis and slight cord flattening.  Abnormal appearance of the vertebral arteries and basilar artery. Please see below.  IMPRESSION: No acute infarct.  Remote right hemispheric infarct and small vessel disease type changes as noted above.  MRA HEAD  Findings: Abrupt cutoff of flow at the right carotid terminus with nonvisualization of right middle cerebral artery branch vessels consistent with the patient's remote right hemispheric infarct.  Mild irregularity and slight narrowing of the supraclinoid segment of the right internal carotid artery.  Ectatic left internal carotid artery most notable cavernous segment supraclinoid segment.  Left middle cerebral artery mild branch vessel irregularity.  Fetal type origin of the posterior cerebral arteries with posterior cerebral artery supplied predominately from the anterior circulation.  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  Significantly attenuated and narrowed  right vertebral artery and right PICA.  IMPRESSION: Abrupt cutoff of flow at the right carotid terminus with nonvisualization of right middle cerebral artery branch vessels consistent with the patient's remote right hemispheric infarct.  Fetal type origin of the posterior cerebral arteries with posterior cerebral artery supplied predominately from the anterior circulation.  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  Significantly attenuated and narrowed right vertebral artery and right PICA.   Original Report Authenticated By: Lacy Duverney, M.D.    Scheduled Meds: .  aspirin  300 mg Rectal Daily   Or  . aspirin  325 mg Oral Daily  . enoxaparin (LOVENOX) injection  40 mg Subcutaneous Q24H  . insulin aspart  0-9 Units Subcutaneous TID WC   Continuous Infusions: . sodium chloride 10 mL/hr at 10/04/12 0125       Time spent: 25 minutes    Otto Caraway  [facility] Pager [phone] If 7PM-7AM, please contact night-coverage at [email or web address], password [facility] 10/04/2012, 4:57 PM  LOS: 2 days

## 2012-10-04 NOTE — Procedures (Signed)
EEG report.  Brief clinical history: [name] is a 57 y.o. male presenting with recurrent left hand/face numbness. No prior history of frank epileptic seizures.  Technique: this is a 17 channel routine scalp EEG performed at the bedside with bipolar and monopolar montages arranged in accordance to the international 10/20 system of electrode placement. One channel was dedicated to EKG recording.  The study was performed during wakefulness, drowsiness, and stage 2 sleep. Intermittent photic stimulation was utilized as activating procedure.  Description:In the wakeful state, the best background consisted of a medium amplitude, posterior dominant, well sustained, symmetric and reactive 10 Hz rhythm. Drowsiness demonstrated dropout of the alpha rhythm. Stage 2 sleep showed symmetric and synchronous sleep spindles without intermixed epileptiform discharges. Hyperventilation was not carried out. Intermittent photic stimulation did induce a normal driving response.  No focal or generalized epileptiform discharges noted.  No slowing seen.  EKG showed sinus rhythm.  Impression: this is a normal awake and asleep EEG. Please, be aware that a normal EEG does not exclude the possibility of epilepsy.  Clinical correlation is advised.  Wyatt Portela, MD [facility]

## 2012-10-04 NOTE — Progress Notes (Signed)
Notified NP of patient not "feeling well" at 0023.  Pt complained of "feeling sick" in stomach and lungs.  Denied nausea, chest pain, heart burn.  Vitals stable 164/101 HR 70, 96% RA.  Phenergan ordered, went in at 0100 to give medicine and patient resting comfortably.  Asked patient if he wants medicine to help him feel well, and he stated he didn't need any medicine.  Will continue to monitor.  Patient resting comfortably now.  Barrie Lyme RN 1:40 AM 10/04/2012

## 2012-10-04 NOTE — Interval H&P Note (Signed)
History and Physical Interval Note:  10/04/2012 2:25 PM  Charles [name]  has presented today for surgery, with the diagnosis of stroke  The various methods of treatment have been discussed with the patient and family. After consideration of risks, benefits and other options for treatment, the patient has consented to  Procedure(s): TRANSESOPHAGEAL ECHOCARDIOGRAM (TEE) (N/A) as a surgical intervention .  The patient's history has been reviewed, patient examined, no change in status, stable for surgery.  I have reviewed the patient's chart and labs.  Questions were answered to the patient's satisfaction.     [name].

## 2012-10-04 NOTE — H&P (View-Only) (Signed)
Stroke Team Progress Note  HISTORY Charles [name] is an 57 y.o. male treated in [place] in April for right hemispheric stroke. Family members do not have the medical records with them and are not versed in medical treatments thus cannot provide significant information. He was discharged on aspirin and Plavix and subsequently transferred to rehabilitation. He returned home and moved out to live with his mother in [place]. While at home one week ago he was complaining of left facial and arm numbness. Mother states this is a "on and off thing". Patient was seen at [facility] on 8/5. At that the patient had a CT scan that showed chronic right MCA distribution changes with low attenuation in posterior aspect of the right frontal lobe and anterior right parietal lobe, suggestive of subacute ischemia. Patient also complained of some chest discomfort but denied any dizziness or lightheadedness.  Patient also has been having some left arm spasms which can be decreased by manual pressure or manipulation. In addition sensations of "shaking" in the back of his head".  As noted above, family is getting all the information from previous hospitalization as they are unsure what was done in April and feels there was a surgery presented to them in which a artery from "his brain could have been placed to reroute the blood flow". Looking into records [facility]-IC bypass was mentioned but it was decided that this procedure would be reassessed in a out patient setting. Per mother this was dicussed again with Dr. Hakma and decided not to go this route. I have explained to them I am unsure of what they are talking about and look forward to seeing the noted from Dr. Hakma of the [facility] [place] tel: [phone], Fax: [phone]   Date last known well: Unable to determine  Time last known well: Unable to determine  tPA Given: No: recent stroke  He was admitted to the stroke unit for further  monitoring.   SUBJECTIVE  No new exacerbation of symptoms. Patient is preparing for TEE. Family in room. No new concerns.   OBJECTIVE Most recent Vital Signs: Filed Vitals:   10/03/12 2200 10/04/12 0000 10/04/12 0200 10/04/12 0600  BP: 138/89 164/101 164/114 134/91  Pulse: 80 70 71 79  Temp: 97.3 F (36.3 C)  97.3 F (36.3 C) 97.5 F (36.4 C)  TempSrc: Oral  Oral Oral  Resp: 20 16 18 18  SpO2: 98%  96% 97%   CBG (last 3)   Recent Labs  10/03/12 1651 10/03/12 2100 10/04/12 0642  GLUCAP 127* 183* 144*    IV Fluid Intake:   . sodium chloride 10 mL/hr at 10/04/12 0125    MEDICATIONS  . aspirin  300 mg Rectal Daily   Or  . aspirin  325 mg Oral Daily  . enoxaparin (LOVENOX) injection  40 mg Subcutaneous Q24H  . insulin aspart  0-9 Units Subcutaneous TID WC   PRN:  promethazine, senna-docusate, traMADol-acetaminophen  Diet:  NPO thin liquids Activity: --- DVT Prophylaxis: lovenox  CLINICALLY SIGNIFICANT STUDIES Basic Metabolic Panel:   Recent Labs Lab 10/02/12 0810 10/02/12 1030  NA  --  144  K  --  3.6  CL  --  106  CO2  --  30  GLUCOSE  --  111*  BUN  --  14  CREATININE 1.09 1.14  CALCIUM  --  9.1   Liver Function Tests:   Recent Labs Lab 10/02/12 1030  AST 23    ALT 35  ALKPHOS 75  BILITOT 0.4  PROT 6.4  ALBUMIN 3.4*   CBC:   Recent Labs Lab 10/03/12 0610 10/04/12 0535  WBC 5.8 5.1  HGB 13.4 13.4  HCT 37.1* 38.0*  MCV 86.1 86.4  PLT 107* 100*   Coagulation: No results found for this basename: LABPROT, INR,  in the last 168 hours Cardiac Enzymes:   Recent Labs Lab 10/02/12 1030 10/02/12 1610 10/02/12 2227  TROPONINI <0.30 <0.30 <0.30   Urinalysis:   Recent Labs Lab 10/02/12 2337  COLORURINE YELLOW  LABSPEC 1.015  PHURINE 5.5  GLUCOSEU 100*  HGBUR NEGATIVE  BILIRUBINUR NEGATIVE  KETONESUR NEGATIVE  PROTEINUR NEGATIVE  UROBILINOGEN 0.2  NITRITE NEGATIVE  LEUKOCYTESUR NEGATIVE   Lipid Panel    Component Value  Date/Time   CHOL 174 10/03/2012 0610   TRIG 143 10/03/2012 0610   HDL 75 10/03/2012 0610   CHOLHDL 2.3 10/03/2012 0610   VLDL 29 10/03/2012 0610   LDLCALC 70 10/03/2012 0610   HgbA1C  Lab Results  Component Value Date   HGBA1C 7.4* 10/03/2012    Urine Drug Screen:   No results found for this basename: labopia,  cocainscrnur,  labbenz,  amphetmu,  thcu,  labbarb    Alcohol Level: No results found for this basename: ETH,  in the last 168 hours  Mr Brain Wo Contrast 10/02/2012   : No acute infarct.  Remote right hemispheric infarct and small vessel disease type changes as noted above.   Dg Chest Port 1 View 10/02/2012   : No acute abnormality is noted.    Mr Mra Head/brain Wo Cm 10/02/2012   Abrupt cutoff of flow at the right carotid terminus with nonvisualization of right middle cerebral artery branch vessels consistent with the patient's remote right hemispheric infarct.  Fetal type origin of the posterior cerebral arteries with posterior cerebral artery supplied predominately from the anterior circulation.  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   2D Echocardiogram 60%, wall motion normal, LA normal size  Carotid Doppler  Preliminary report: Bilateral: 1-39% ICA stenosis. Vertebral artery flow is antegrade  CXR    EKG  .   EEG  Therapy Recommendations   Physical Exam   Middle aged african american male who is mute and deaf and understands sign language.Awake alert. Afebrile. Head is nontraumatic. Neck is supple without bruit.   Cardiac exam no murmur or gallop. Lungs are clear to auscultation. Distal pulses are well felt. Neurological Exam ; awake alert mental status exam limited due to patient understanding all the sign language. Extraocular movements are full range without nystagmus. Blinks to threat bilaterally. Fundi were not visualized. Vision acuity cannot be tested accurately. Mild left lower facial weakness. Tongue is midline. Motor system exam reveals  spastic left hemiplegia with 0/5 left upper extremity and 3/5 left lower extremity strength with weakness of the left ankle dorsiflexors. Left foot drop. Spasticity on the left. Tone is increased on the left. Deep tendon reflexes are exaggerated on the left. Left plantar is upgoing right is downgoing. Mild decreased sensation on the left hemibody. Gait was not tested.   ASSESSMENT [name] is a 57 y.o. male presenting with recurrent left hand/face numbness. Imaging confirms abrupt cutoff of flow at distal right MCA with nonvisualization of right middle cerebral artery branch vessels consistent with the patient's remote right hemispheric infarct.. Infarct felt to be embolic secondary to unknown source.  On aspirin 81 mg orally every day and   clopidogrel 75 mg orally every day prior to admission. Now on aspirin 325 mg orally every day for secondary stroke prevention. Patient with resultant left hemiparesis, numbness which is better per patient. Work up underway.   Diabetes melliltius , type 2, hgb a1c 7.3, goal < 6.5  Hypertension  Sarcoidosis  Previous RMCA stroke  Hospital day # 2  TREATMENT/PLAN  Continue aspirin 325 mg orally every day for secondary stroke prevention.  Risk factor modification  hypercoag labs pending TEE to look for embolic source. I have set up with [facility] for today. If positive for PFO (patent foramen ovale), check bilateral lower extremity venous dopplers to rule out DVT as possible source of stroke.  EEG just resulted: this is a normal awake and asleep EEG. No focal or generalized epileptiform discharges noted.    Dr. Marthella Osorno spoke with patient 10/03/2012 (through sign language interpreter) and with family re: past hospitalizations. The family had a folder which included the past hospitalization's studies, notes which were reviewed with family. At this point, surgical intervention is not felt to be indicated due to completed stroke 05/2012; however with  further symptoms, medical management. Patient and family were told about the TEE and all questions were answered of why the procedure was needed.   Lynn D. Brown, PAC, MBA, MHA [facility] Pager: [phone] 10/04/2012 9:50 AM  I have personally obtained a history, examined the patient, evaluated imaging results, and formulated the assessment and plan of care. I agree with the above.  Suann Klier, MD 

## 2012-10-04 NOTE — Progress Notes (Signed)
Stroke Team Progress Note  HISTORY Charles [name] is an 57 y.o. male treated in [place] in April for right hemispheric stroke. Family members do not have the medical records with them and are not versed in medical treatments thus cannot provide significant information. He was discharged on aspirin and Plavix and subsequently transferred to rehabilitation. He returned home and moved out to live with his mother in [place]. While at home one week ago he was complaining of left facial and arm numbness. Mother states this is a "on and off thing". Patient was seen at [facility] on 8/5. At that the patient had a CT scan that showed chronic right MCA distribution changes with low attenuation in posterior aspect of the right frontal lobe and anterior right parietal lobe, suggestive of subacute ischemia. Patient also complained of some chest discomfort but denied any dizziness or lightheadedness.  Patient also has been having some left arm spasms which can be decreased by manual pressure or manipulation. In addition sensations of "shaking" in the back of his head".  As noted above, family is getting all the information from previous hospitalization as they are unsure what was done in April and feels there was a surgery presented to them in which a artery from "his brain could have been placed to reroute the blood flow". Looking into records EC-IC bypass was mentioned but it was decided that this procedure would be reassessed in a out patient setting. Per mother this was dicussed again with Dr. Jenkins Rouge and decided not to go this route. I have explained to them I am unsure of what they are talking about and look forward to seeing the noted from Dr. Jenkins Rouge of the [facility] [place] tel: [phone], Fax: [phone]   Date last known well: Unable to determine  Time last known well: Unable to determine  tPA Given: No: recent stroke  He was admitted to the stroke unit for further  monitoring.   SUBJECTIVE  No new exacerbation of symptoms. Patient is preparing for TEE. Family in room. No new concerns.   OBJECTIVE Most recent Vital Signs: Filed Vitals:   10/03/12 2200 10/04/12 0000 10/04/12 0200 10/04/12 0600  BP: 138/89 164/101 164/114 134/91  Pulse: 80 70 71 79  Temp: 97.3 F (36.3 C)  97.3 F (36.3 C) 97.5 F (36.4 C)  TempSrc: Oral  Oral Oral  Resp: 20 16 18 18   SpO2: 98%  96% 97%   CBG (last 3)   Recent Labs  10/03/12 1651 10/03/12 2100 10/04/12 0642  GLUCAP 127* 183* 144*    IV Fluid Intake:   . sodium chloride 10 mL/hr at 10/04/12 0125    MEDICATIONS  . aspirin  300 mg Rectal Daily   Or  . aspirin  325 mg Oral Daily  . enoxaparin (LOVENOX) injection  40 mg Subcutaneous Q24H  . insulin aspart  0-9 Units Subcutaneous TID WC   PRN:  promethazine, senna-docusate, traMADol-acetaminophen  Diet:  NPO thin liquids Activity: --- DVT Prophylaxis: lovenox  CLINICALLY SIGNIFICANT STUDIES Basic Metabolic Panel:   Recent Labs Lab 10/02/12 0810 10/02/12 1030  NA  --  144  K  --  3.6  CL  --  106  CO2  --  30  GLUCOSE  --  111*  BUN  --  14  CREATININE 1.09 1.14  CALCIUM  --  9.1   Liver Function Tests:   Recent Labs Lab 10/02/12 1030  AST 23  ALT 35  ALKPHOS 75  BILITOT 0.4  PROT 6.4  ALBUMIN 3.4*   CBC:   Recent Labs Lab 10/03/12 0610 10/04/12 0535  WBC 5.8 5.1  HGB 13.4 13.4  HCT 37.1* 38.0*  MCV 86.1 86.4  PLT 107* 100*   Coagulation: No results found for this basename: LABPROT, INR,  in the last 168 hours Cardiac Enzymes:   Recent Labs Lab 10/02/12 1030 10/02/12 1610 10/02/12 2227  TROPONINI <0.30 <0.30 <0.30   Urinalysis:   Recent Labs Lab 10/02/12 2337  COLORURINE YELLOW  LABSPEC 1.015  PHURINE 5.5  GLUCOSEU 100*  HGBUR NEGATIVE  BILIRUBINUR NEGATIVE  KETONESUR NEGATIVE  PROTEINUR NEGATIVE  UROBILINOGEN 0.2  NITRITE NEGATIVE  LEUKOCYTESUR NEGATIVE   Lipid Panel    Component Value  Date/Time   CHOL 174 10/03/2012 0610   TRIG 143 10/03/2012 0610   HDL 75 10/03/2012 0610   CHOLHDL 2.3 10/03/2012 0610   VLDL 29 10/03/2012 0610   LDLCALC 70 10/03/2012 0610   HgbA1C  Lab Results  Component Value Date   HGBA1C 7.4* 10/03/2012    Urine Drug Screen:   No results found for this basename: labopia,  cocainscrnur,  labbenz,  amphetmu,  thcu,  labbarb    Alcohol Level: No results found for this basename: ETH,  in the last 168 hours  Mr Brain Wo Contrast 10/02/2012   : No acute infarct.  Remote right hemispheric infarct and small vessel disease type changes as noted above.   Dg Chest Port 1 View 10/02/2012   : No acute abnormality is noted.    Mr Maxine Glenn Head/brain Wo Cm 10/02/2012   Abrupt cutoff of flow at the right carotid terminus with nonvisualization of right middle cerebral artery branch vessels consistent with the patient's remote right hemispheric infarct.  Fetal type origin of the posterior cerebral arteries with posterior cerebral artery supplied predominately from the anterior circulation.  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   2D Echocardiogram 60%, wall motion normal, LA normal size  Carotid Doppler  Preliminary report: Bilateral: 1-39% ICA stenosis. Vertebral artery flow is antegrade  CXR    EKG  .   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 Afebrile. Head is nontraumatic. Neck is supple without bruit.   Cardiac exam no murmur or gallop. Lungs are clear to auscultation. Distal pulses are well felt. Neurological Exam ; awake alert mental status exam limited due to patient understanding all the sign language. Extraocular movements are full range without nystagmus. Blinks to threat bilaterally. Fundi were not visualized. Vision acuity cannot be tested accurately. Mild left lower facial weakness. Tongue is midline. Motor system exam reveals  spastic left hemiplegia with 0/5 left upper extremity and 3/5 left lower extremity strength with weakness of the left ankle dorsiflexors. Left foot drop. Spasticity on the left. Tone is increased on the left. Deep tendon reflexes are exaggerated on the left. Left plantar is upgoing right is downgoing. Mild decreased sensation on the left hemibody. Gait was not tested.   ASSESSMENT Mr. FABRIZIO FILIP is a 57 y.o. male presenting with recurrent left hand/face numbness. Imaging confirms abrupt cutoff of flow at distal right MCA with nonvisualization of right middle cerebral artery branch vessels consistent with the patient's remote right hemispheric infarct.. Infarct felt to be embolic secondary to unknown source.  On aspirin 81 mg orally every day and  clopidogrel 75 mg orally every day prior to admission. Now on aspirin 325 mg orally every day for secondary stroke prevention. Patient with resultant left hemiparesis, numbness which is better per patient. Work up underway.   Diabetes melliltius , type 2, hgb a1c 7.3, goal < 6.5  Hypertension  Sarcoidosis  Previous RMCA stroke  Hospital day # 2  TREATMENT/PLAN  Continue aspirin 325 mg orally every day for secondary stroke prevention.  Risk factor modification  hypercoag labs pending TEE to look for embolic source. I have set up with [facility] for today. If positive for PFO (patent foramen ovale), check bilateral lower extremity venous dopplers to rule out DVT as possible source of stroke.  EEG just resulted: this is a normal awake and asleep EEG. No focal or generalized epileptiform discharges noted.    Dr. Pearlean Brownie spoke with patient 10/03/2012 (through sign language interpreter) and with family re: past hospitalizations. The family had a folder which included the past hospitalization's studies, notes which were reviewed with family. At this point, surgical intervention is not felt to be indicated due to completed stroke 05/2012; however with  further symptoms, medical management. Patient and family were told about the TEE and all questions were answered of why the procedure was needed.   Gwendolyn Lima. Manson Passey, [facility], MBA, MHA [facility] Pager: [phone] 10/04/2012 9:50 AM  I have personally obtained a history, examined the patient, evaluated imaging results, and formulated the assessment and plan of care. I agree with the above.  Delia Heady, MD

## 2012-10-04 NOTE — Progress Notes (Signed)
  Echocardiogram Echocardiogram Transesophageal has been performed.  Juliette Standre FRANCES 10/04/2012, 3:09 PM

## 2012-10-04 NOTE — Care Management Note (Signed)
    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  CM FOLLOWING FOR DCP   Anticipated DC Date:  10/10/2012   Anticipated DC Plan:  HOME W HOME HEALTH SERVICES      DC Planning Services  CM consult      [facility] Choice  Resumption Of Svcs/PTA [place]   Choice offered to / List presented to:  C-4 Adult Children        HH arranged  HH-2 PT      [facility] agency  [facility]   Status of service:  Completed, signed off Medicare Important Message given?  NA - LOS <3 / Initial given by admissions (If response is "NO", the following Medicare IM given date fields will be blank) Date Medicare IM given:   Date Additional Medicare IM given:    Discharge Disposition:  HOME W HOME HEALTH SERVICES  Per UR Regulation:  Reviewed for med. necessity/level of care/duration of stay  If discussed at Long Length of Stay Meetings, dates discussed:    Comments:  10/05/12 Elmer Bales RN, MSN, CM- Marie with [facility] was notified of patient's discharge.   10/04/12 1125 Elmer Bales RN, MSN, CM- Met with patient's mother to verify that home health services are in place in the home.  Patient's mother states that patient is currently active with [facility].  CM will continue to follow and notifiy [facility] when patient is discharged.   10/03/2012- B [name] RN,BSN,MHA

## 2012-10-04 NOTE — Progress Notes (Signed)
PT Cancellation Note  Patient Details Name: Charles [name] MRN: [id number] DOB: 06-Feb-1956   Cancelled Treatment:    Reason Eval/Treat Not Completed: Other (comment) (pt did not feel well (nauseated) and did not want to get up )  PT to check back later as time allows.     Rollene Rotunda Shamyah Stantz, PT, DPT [phone]   10/04/2012, 1:52 PM

## 2012-10-04 NOTE — Progress Notes (Signed)
Patient's TEE is scheduled for 1pm today (10/05/11). Consent has not sign d/t deficits. Interpreter called and confirmed to be on site at 1250 PM. Will continue to monitor.  Sim Boast, RN [phone]

## 2012-10-04 NOTE — CV Procedure (Signed)
    Transesophageal Echocardiogram Note  Charles [name] [id number] [birth year]  Procedure: Transesophageal Echocardiogram Indications: CVA  Procedure Details Consent: Obtained Time Out: Verified patient identification, verified procedure, site/side was marked, verified correct patient position, special equipment/implants available, Radiology Safety Procedures followed,  medications/allergies/relevent history reviewed, required imaging and test results available.  Performed, Pt is deaf.  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   Vesta Mixer, Montez Hageman., MD, [facility] 10/04/2012, 2:41 PM

## 2012-10-05 ENCOUNTER — Encounter (HOSPITAL_COMMUNITY): Payer: Self-pay | Admitting: Cardiovascular Disease

## 2012-10-05 DIAGNOSIS — H913 Deaf nonspeaking, not elsewhere classified: Secondary | ICD-10-CM | POA: Diagnosis present

## 2012-10-05 LAB — LUPUS ANTICOAGULANT PANEL
DRVVT: 35.5 secs (ref <42.9)
Lupus Anticoagulant: NOT DETECTED
PTT Lupus Anticoagulant: 37.5 secs (ref 28.0–43.0)

## 2012-10-05 LAB — CBC
HCT: 38.6 % — ABNORMAL LOW (ref 39.0–52.0)
Hemoglobin: 13.2 g/dL (ref 13.0–17.0)
MCHC: 34.2 g/dL (ref 30.0–36.0)

## 2012-10-05 LAB — GLUCOSE, CAPILLARY
Glucose-Capillary: 132 mg/dL — ABNORMAL HIGH (ref 70–99)
Glucose-Capillary: 124 mg/dL — ABNORMAL HIGH (ref 70–99)

## 2012-10-05 LAB — PROTEIN C ACTIVITY: Protein C Activity: 200 % — ABNORMAL HIGH (ref 75–133)

## 2012-10-05 LAB — ANA: Anti Nuclear Antibody(ANA): NEGATIVE

## 2012-10-05 LAB — CARDIOLIPIN ANTIBODIES, IGG, IGM, IGA: Anticardiolipin IgA: 0 APL U/mL — ABNORMAL LOW (ref <22)

## 2012-10-05 MED ORDER — ASPIRIN EC 325 MG PO TBEC: 325.0000 mg | DELAYED_RELEASE_TABLET | Freq: Every day | ORAL | Status: DC

## 2012-10-05 MED ORDER — PANTOPRAZOLE SODIUM 40 MG PO TBEC: 40.0000 mg | DELAYED_RELEASE_TABLET | Freq: Every day | ORAL | Status: DC

## 2012-10-05 MED ORDER — ALBUTEROL SULFATE HFA 108 (90 BASE) MCG/ACT IN AERS: 2.0000 | INHALATION_SPRAY | Freq: Four times a day (QID) | RESPIRATORY_TRACT | Status: DC | PRN

## 2012-10-05 MED ORDER — METFORMIN HCL 500 MG PO TABS: 500.0000 mg | ORAL_TABLET | Freq: Two times a day (BID) | ORAL | Status: DC

## 2012-10-05 NOTE — Progress Notes (Signed)
Stroke Team Progress Note  HISTORY Charles [name] is an 57 y.o. male treated in [place] in April for right hemispheric stroke. Family members do not have the medical records with them and are not versed in medical treatments thus cannot provide significant information. He was discharged on aspirin and Plavix and subsequently transferred to rehabilitation. He returned home and moved out to live with his mother in [place]. While at home one week ago he was complaining of left facial and arm numbness. Mother states this is a "on and off thing". Patient was seen at [facility] on 8/5. At that the patient had a CT scan that showed chronic right MCA distribution changes with low attenuation in posterior aspect of the right frontal lobe and anterior right parietal lobe, suggestive of subacute ischemia. Patient also complained of some chest discomfort but denied any dizziness or lightheadedness.  Patient also has been having some left arm spasms which can be decreased by manual pressure or manipulation. In addition sensations of "shaking" in the back of his head".  As noted above, family is getting all the information from previous hospitalization as they are unsure what was done in April and feels there was a surgery presented to them in which a artery from "his brain could have been placed to reroute the blood flow". Looking into records EC-IC bypass was mentioned but it was decided that this procedure would be reassessed in a out patient setting. Per mother this was dicussed again with Dr. Jenkins Rouge and decided not to go this route. I have explained to them I am unsure of what they are talking about and look forward to seeing the noted from Dr. Jenkins Rouge of the [facility] [place] tel: [phone], Fax: [phone]   Date last known well: Unable to determine  Time last known well: Unable to determine  tPA Given: No: recent stroke  He was admitted to the stroke unit for further  monitoring.   SUBJECTIVE  No new exacerbation of symptoms. Patient is preparing for TEE. Family in room. No new concerns.   OBJECTIVE Most recent Vital Signs: Filed Vitals:   10/04/12 2252 10/05/12 0146 10/05/12 0532 10/05/12 1102  BP: 151/100 151/99 130/92 135/82  Pulse: 94 82 78 89  Temp: 97.9 F (36.6 C) 97.5 F (36.4 C) 97.8 F (36.6 C) 98.4 F (36.9 C)  TempSrc: Oral Oral Oral Oral  Resp: 16 18 18 18   Height:      Weight:      SpO2: 97% 98%  99%   CBG (last 3)   Recent Labs  10/04/12 1627 10/04/12 2158 10/05/12 0706  GLUCAP 111* 192* 117*    IV Fluid Intake:   . sodium chloride 10 mL/hr at 10/04/12 0125    MEDICATIONS  . aspirin  300 mg Rectal Daily   Or  . aspirin  325 mg Oral Daily  . enoxaparin (LOVENOX) injection  40 mg Subcutaneous Q24H  . insulin aspart  0-9 Units Subcutaneous TID WC   PRN:  albuterol, promethazine, senna-docusate, traMADol-acetaminophen  Diet:  Carb Control thin liquids Activity: --- DVT Prophylaxis: lovenox  CLINICALLY SIGNIFICANT STUDIES Basic Metabolic Panel:   Recent Labs Lab 10/02/12 0810 10/02/12 1030  NA  --  144  K  --  3.6  CL  --  106  CO2  --  30  GLUCOSE  --  111*  BUN  --  14  CREATININE 1.09 1.14  CALCIUM  --  9.1   Liver Function Tests:   Recent Labs Lab 10/02/12 1030  AST 23  ALT 35  ALKPHOS 75  BILITOT 0.4  PROT 6.4  ALBUMIN 3.4*   CBC:   Recent Labs Lab 10/04/12 0535 10/05/12 0550  WBC 5.1 5.1  HGB 13.4 13.2  HCT 38.0* 38.6*  MCV 86.4 87.3  PLT 100* 107*   Coagulation: No results found for this basename: LABPROT, INR,  in the last 168 hours Cardiac Enzymes:   Recent Labs Lab 10/02/12 1030 10/02/12 1610 10/02/12 2227  TROPONINI <0.30 <0.30 <0.30   Urinalysis:   Recent Labs Lab 10/02/12 2337  COLORURINE YELLOW  LABSPEC 1.015  PHURINE 5.5  GLUCOSEU 100*  HGBUR NEGATIVE  BILIRUBINUR NEGATIVE  KETONESUR NEGATIVE  PROTEINUR NEGATIVE  UROBILINOGEN 0.2  NITRITE  NEGATIVE  LEUKOCYTESUR NEGATIVE   Lipid Panel    Component Value Date/Time   CHOL 174 10/03/2012 0610   TRIG 143 10/03/2012 0610   HDL 75 10/03/2012 0610   CHOLHDL 2.3 10/03/2012 0610   VLDL 29 10/03/2012 0610   LDLCALC 70 10/03/2012 0610   HgbA1C  Lab Results  Component Value Date   HGBA1C 7.4* 10/03/2012    Urine Drug Screen:   No results found for this basename: labopia,  cocainscrnur,  labbenz,  amphetmu,  thcu,  labbarb    Alcohol Level: No results found for this basename: ETH,  in the last 168 hours  Mr Brain Wo Contrast 10/02/2012   : No acute infarct.  Remote right hemispheric infarct and small vessel disease type changes as noted above.   Dg Chest Port 1 View 10/02/2012   : No acute abnormality is noted.    Mr Maxine Glenn Head/brain Wo Cm 10/02/2012   Abrupt cutoff of flow at the right carotid terminus with nonvisualization of right middle cerebral artery branch vessels consistent with the patient's remote right hemispheric infarct.  Fetal type origin of the posterior cerebral arteries with posterior cerebral artery supplied predominately from the anterior circulation.  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   2D Echocardiogram 60%, wall motion normal, LA normal size  Carotid Doppler  Preliminary report: Bilateral: 1-39% ICA stenosis. Vertebral artery flow is antegrade  CXR    EKG  .   TEE no PFO, Pulm AVM  EEG this is a normal awake and asleep EEG. 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 Afebrile. Head is nontraumatic. Neck is supple without bruit.   Cardiac exam no murmur or gallop. Lungs are clear to auscultation. Distal pulses are well felt. Neurological Exam ; awake alert mental status exam limited due to patient understanding all the sign language. Extraocular movements are full range without  nystagmus. Blinks to threat bilaterally. Fundi were not visualized. Vision acuity cannot be tested accurately. Mild left lower facial weakness. Tongue is midline. Motor system exam reveals spastic left hemiplegia with 0/5 left upper extremity and 3/5 left lower extremity strength with weakness of the left ankle dorsiflexors. Left foot drop. Spasticity on the left. Tone is increased on the left. Deep tendon reflexes are exaggerated on the left. Left plantar is upgoing right is downgoing. Mild decreased sensation on the left hemibody. Gait was not tested.   ASSESSMENT Charles [name] is a 57 y.o. male presenting with recurrent left hand/face numbness. Imaging confirms abrupt cutoff of flow at  distal right MCA with nonvisualization of right middle cerebral artery branch vessels consistent with the patient's remote right hemispheric infarct.. Infarct felt to be embolic secondary to unknown source.  On aspirin 81 mg orally every day and clopidogrel 75 mg orally every day prior to admission. Now on aspirin 325 mg orally every day for secondary stroke prevention. Patient with resultant left hemiparesis, numbness which is better per patient. Work up underway.   Diabetes melliltius , type 2, hgb a1c 7.3, goal < 6.5  Hypertension  Sarcoidosis  Previous RMCA stroke  Hospital day # 3  TREATMENT/PLAN  Continue aspirin 325 mg orally every day for secondary stroke prevention.  Risk factor modification: strict diabetic diet  hypercoag labs pending, so far negative. Have patient follow up with Dr. Pearlean Brownie in 2 months (or other neurologist if patient does not remain in this area). I would advise him to obtain medical records if he does not remain in this area.  Gwendolyn Lima. Manson Passey, [facility], MBA, MHA [facility] Pager: [phone] 10/05/2012 11:52 AM  I have personally obtained a history, examined the patient, evaluated imaging results, and formulated the assessment and plan of care. I agree with  the above.  Delia Heady, MD

## 2012-10-05 NOTE — Progress Notes (Signed)
PT Cancellation Note  Patient Details Name: Charles [name] MRN: [id number] DOB: 28-May-1955   Cancelled Treatment:    Reason Eval/Treat Not Completed: Other (comment) (pt ready for d/c.  No questions for PT.  )   Lurena Joiner B. Reyes [place], PT, DPT [phone]   10/05/2012, 3:38 PM

## 2012-10-05 NOTE — Discharge Summary (Addendum)
Physician Discharge Summary  Charles [name] ZOX:[id number] DOB: 03-17-1955 DOA: 10/02/2012  PCP: Kirstie Peri, MD  Admit date: 10/02/2012 Discharge date: 10/05/2012  Time spent: 40 minutes  Recommendations for Outpatient Follow-up:  1. Home with HHPT, RN 2. Followup with PCP and urology as outpatient. 3. Appointment scheduled with outpatient pulmonary on 8/21 at 3:30 PM with Dr. Sherene Sires. 4. Please follow results of complete hypercoagulable w/up done this admission.   Discharge Diagnoses:  TIA like symptoms  Active Problems:   CVA (cerebral vascular accident)   Type II or unspecified type diabetes mellitus without mention of complication, uncontrolled   Sarcoidosis   Deaf mutism, congenital   Discharge Condition: fair  Diet recommendation: daibetic  Filed Weights   10/04/12 1350  Weight: 75.297 kg (166 lb)    History of present illness:  Please refer to admission history and physical for details but in brief, 57 year old deaf and mute male male with history of sarcoidosis, diabetes mellitus, history of CVA in April 2014 with residual left-sided weakness cause crossword from [facility] ED for left-sided weakness and numbness of his face and his left arm for past 1 week. CT head an MRI brain showing remote rt hemispheric infarct with abrupt cutoff of flow at the right carotid terminus with nonvisualization of right middle cerebral artery branch vessels.    Hospital Course:  TIA like symptoms with Hx of CVA  No new acute infarct seen on imaging.  Has residual left-sided weakness ( rt UE hemiplegia and LLE hemiparesis)  2D echo unremarkable. Carotid doppler with <40 % bilateral ICA stenosis.  patient on baby ASA and plavix prior to admission. placed on full dose ASA here.  EEG shows no epileptic activity.  appreciate neurology recommendation. Recommend optimal medical management. Given likely embolic stroke recommend for TEE to rule out PFO and source of emboli. TEE doen on  8/14 unremarkable for PFO or thrombi.  PT/OT eval  Lipid panel within normal limit. hemoglobin A1c of 7.4. He will be discharged on full dose aspirin , continue BP core. Patient on glipizide at home. I will add a low dose metformin.  Diabetes mellitus  Hemoglobin A1c of 7.4. Patient on glipizide at home. Added  Metformin 500 mg bid  Sarcoidosis  On low dose prednisone which eh was [place] iin [place]. He reports following with a pulmonologist there. Chest x-ray shows multiple calcified lymph nodes within the hilum and mediastinum. o2 sat stable on RA. Needs to establish care with pulmonary as outpt. i have set up an appt for 8/21 with Dr Sherene Sires at 3:30 pm.  GERD  patient reports some heartburn symptoms. He was on Pepcid at home. platelets are 100 and i will d/c them and switch to protonix.  Patient clinically stable to be discharged home. He would resume his home PT and OT. He'll followup with Dr. Anne Hahn at [facility]  neurology in 2 months.  Code Status: Full code  Family Communication: Mother and brother at bedside who were explained about the discharge plan and outpatient followup. Patient explained about the hospital course and discharge plan with the help of sign language interpreter.  Disposition Plan: Home with outpatient followup  Consultants:  Neurology  Cardiology for TEE   Procedures:  TEE on 8/14  EEG  Antibiotics:  None       Discharge Exam: Filed Vitals:   10/05/12 1102  BP: 135/82  Pulse: 89  Temp: 98.4 F (36.9 C)  Resp: 18   General: Middle aged male in no acute  distress. Deaf and mute  HEENT: no pallor, rt facial droop. , Moist oral mucosa  Chest: Clear to auscultation bilaterally, no added sounds  CVS: Normal S1 and S2, no murmurs rub or gallop  Abdomen: Soft, nontender, nondistended, bowel sounds present  Extremities: Warm, no edema  CNS: AAO x3, left UE Hemiplegia, 4/5 power over LLE.    Discharge Instructions     Medication List     STOP taking these medications       clopidogrel 75 MG tablet  Commonly known as:  PLAVIX     famotidine 20 MG tablet  Commonly known as:  PEPCID      TAKE these medications       acetaminophen 500 MG tablet  Commonly known as:  TYLENOL  Take 1,000 mg by mouth every 6 (six) hours as needed for pain.     albuterol 108 (90 BASE) MCG/ACT inhaler  Commonly known as:  PROVENTIL HFA;VENTOLIN HFA  Inhale 2 puffs into the lungs every 6 (six) hours as needed for wheezing.     aspirin EC 325 MG tablet  Take 1 tablet (325 mg total) by mouth daily.     atorvastatin 10 MG tablet  Commonly known as:  LIPITOR  Take 10 mg by mouth at bedtime.     baclofen 10 MG tablet  Commonly known as:  LIORESAL  Take 10 mg by mouth daily.     Fluticasone-Salmeterol 250-50 MCG/DOSE Aepb  Commonly known as:  ADVAIR  Inhale 1 puff into the lungs 2 (two) times daily.     glipiZIDE 5 MG tablet  Commonly known as:  GLUCOTROL  Take 5-10 mg by mouth 2 (two) times daily before a meal. 10mg  in the morning before breakfast and 5mg  in the evening before dinner.     lisinopril 2.5 MG tablet  Commonly known as:  PRINIVIL,ZESTRIL  Take 2.5 mg by mouth daily.     metFORMIN 500 MG tablet  Commonly known as:  GLUCOPHAGE  Take 1 tablet (500 mg total) by mouth 2 (two) times daily with a meal.     pantoprazole 40 MG tablet  Commonly known as:  PROTONIX  Take 1 tablet (40 mg total) by mouth daily.     polyethylene glycol powder powder  Commonly known as:  GLYCOLAX/MIRALAX  Take 17 g by mouth 2 (two) times daily.     predniSONE 10 MG tablet  Commonly known as:  DELTASONE  Take 10 mg by mouth daily.     sertraline 50 MG tablet  Commonly known as:  ZOLOFT  Take 50 mg by mouth daily.       Allergies  Allergen Reactions  . Contrast Media [Iodinated Diagnostic Agents] Itching  . Shellfish Allergy Hives and Swelling       Follow-up Information   Follow up with Lesly Dukes, MD. Schedule an  appointment as soon as possible for a visit in 4 weeks.   Specialty:  Neurology   Contact information:   [address] [place] [address] [phone]       Follow up with [facility], MD In 1 week.   Specialty:  Internal Medicine   Contact information:   [address]  [place] [address] [phone]        The results of significant diagnostics from this hospitalization (including imaging, microbiology, ancillary and laboratory) are listed below for reference.    Significant Diagnostic Studies: Mr Sherrin Daisy Contrast  10/02/2012   *RADIOLOGY REPORT*  Clinical  Data:  Prior stroke.  Presents with left-sided weakness and numbness.  Hypertensive diabetic patient with sarcoidosis.  MRI BRAIN WITHOUT CONTRAST MRA HEAD WITHOUT CONTRAST  Technique: Multiplanar, multiecho pulse sequences of the brain and surrounding structures were obtained according to standard protocol without intravenous contrast.  Angiographic images of the head were obtained using MRA technique without contrast.  Comparison: None.  MRI HEAD  Findings:  No acute infarct.  Remote moderate to large-size right hemispheric infarct with encephalomalacia involving portions of the right frontal lobe, right temporal lobe and right lenticular nucleus. Subsequent dilation right lateral ventricle. Prominent right hemispheric extra- axial space may be related to prior infarct and / or cystic hygroma.  Moderate white matter type changes may represent result of small vessel disease in this diabetic hypertensive patient.  White matter type changes secondary to sarcoidosis not excluded as contributing to this finding.  Some of the white matter type changes within the right cerebral peduncle and pons felt to be related to the remote right hemispheric infarct (Wallerian degeneration but without significant atrophy).  No intracranial hemorrhage.  Atrophy without hydrocephalus.  No intracranial mass lesion detected on this unenhanced exam.   C3-4 bulge/protrusion with mild spinal stenosis and slight cord flattening.  Abnormal appearance of the vertebral arteries and basilar artery. Please see below.  IMPRESSION: No acute infarct.  Remote right hemispheric infarct and small vessel disease type changes as noted above.  MRA HEAD  Findings: Abrupt cutoff of flow at the right carotid terminus with nonvisualization of right middle cerebral artery branch vessels consistent with the patient's remote right hemispheric infarct.  Mild irregularity and slight narrowing of the supraclinoid segment of the right internal carotid artery.  Ectatic left internal carotid artery most notable cavernous segment supraclinoid segment.  Left middle cerebral artery mild branch vessel irregularity.  Fetal type origin of the posterior cerebral arteries with posterior cerebral artery supplied predominately from the anterior circulation.  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  Significantly attenuated and narrowed  right vertebral artery and right PICA.  IMPRESSION: Abrupt cutoff of flow at the right carotid terminus with nonvisualization of right middle cerebral artery branch vessels consistent with the patient's remote right hemispheric infarct.  Fetal type origin of the posterior cerebral arteries with posterior cerebral artery supplied predominately from the anterior circulation.  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  Significantly attenuated and narrowed right vertebral artery and right PICA.   Original Report Authenticated By: Lacy Duverney, M.D.   Dg Chest Port 1 View  10/02/2012   *RADIOLOGY REPORT*  Clinical Data: Shortness of breath  PORTABLE CHEST - 1 VIEW  Comparison: None.  Findings: The cardiac shadow is within normal limits.  Multiple calcified lymph nodes are noted within the hilum and mediastinum consistent with the given clinical history of sarcoidosis.  No focal infiltrate or sizable effusion is seen.  No  bony abnormality is noted.  IMPRESSION: No acute abnormality is noted.   Original Report Authenticated By: Alcide Clever, M.D.   Mr Mra Head/brain Wo Cm  10/02/2012   *RADIOLOGY REPORT*  Clinical Data:  Prior stroke.  Presents with left-sided weakness and numbness.  Hypertensive diabetic patient with sarcoidosis.  MRI BRAIN WITHOUT CONTRAST MRA HEAD WITHOUT CONTRAST  Technique: Multiplanar, multiecho pulse sequences of the brain and surrounding structures were obtained according to standard protocol without intravenous contrast.  Angiographic images of the head were obtained using MRA technique without contrast.  Comparison: None.  MRI HEAD  Findings:  No acute infarct.  Remote moderate to large-size right hemispheric infarct with encephalomalacia involving portions of the right frontal lobe, right temporal lobe and right lenticular nucleus. Subsequent dilation right lateral ventricle. Prominent right hemispheric extra- axial space may be related to prior infarct and / or cystic hygroma.  Moderate white matter type changes may represent result of small vessel disease in this diabetic hypertensive patient.  White matter type changes secondary to sarcoidosis not excluded as contributing to this finding.  Some of the white matter type changes within the right cerebral peduncle and pons felt to be related to the remote right hemispheric infarct (Wallerian degeneration but without significant atrophy).  No intracranial hemorrhage.  Atrophy without hydrocephalus.  No intracranial mass lesion detected on this unenhanced exam.  C3-4 bulge/protrusion with mild spinal stenosis and slight cord flattening.  Abnormal appearance of the vertebral arteries and basilar artery. Please see below.  IMPRESSION: No acute infarct.  Remote right hemispheric infarct and small vessel disease type changes as noted above.  MRA HEAD  Findings: Abrupt cutoff of flow at the right carotid terminus with nonvisualization of right middle cerebral  artery branch vessels consistent with the patient's remote right hemispheric infarct.  Mild irregularity and slight narrowing of the supraclinoid segment of the right internal carotid artery.  Ectatic left internal carotid artery most notable cavernous segment supraclinoid segment.  Left middle cerebral artery mild branch vessel irregularity.  Fetal type origin of the posterior cerebral arteries with posterior cerebral artery supplied predominately from the anterior circulation.  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  Significantly attenuated and narrowed  right vertebral artery and right PICA.  IMPRESSION: Abrupt cutoff of flow at the right carotid terminus with nonvisualization of right middle cerebral artery branch vessels consistent with the patient's remote right hemispheric infarct.  Fetal type origin of the posterior cerebral arteries with posterior cerebral artery supplied predominately from the anterior circulation.  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  Significantly attenuated and narrowed right vertebral artery and right PICA.   Original Report Authenticated By: Lacy Duverney, M.D.    Microbiology: No results found for this or any previous visit (from the past 240 hour(s)).   Labs: Basic Metabolic Panel:  Recent Labs Lab 10/02/12 0810 10/02/12 1030  NA  --  144  K  --  3.6  CL  --  106  CO2  --  30  GLUCOSE  --  111*  BUN  --  14  CREATININE 1.09 1.14  CALCIUM  --  9.1   Liver Function Tests:  Recent Labs Lab 10/02/12 1030  AST 23  ALT 35  ALKPHOS 75  BILITOT 0.4  PROT 6.4  ALBUMIN 3.4*   No results found for this basename: LIPASE, AMYLASE,  in the last 168 hours No results found for this basename: AMMONIA,  in the last 168 hours CBC:  Recent Labs Lab 10/02/12 0810 10/03/12 0610 10/04/12 0535 10/05/12 0550  WBC 7.0 5.8 5.1 5.1  HGB 12.8* 13.4 13.4 13.2  HCT 36.4* 37.1* 38.0* 38.6*  MCV 85.4 86.1 86.4 87.3   PLT 107* 107* 100* 107*   Cardiac Enzymes:  Recent Labs Lab 10/02/12 1030 10/02/12 1610 10/02/12 2227  TROPONINI <0.30 <0.30 <0.30   BNP: BNP (last 3 results) No results found for this basename: PROBNP,  in the last 8760 hours CBG:  Recent Labs Lab 10/04/12 1455 10/04/12 1627 10/04/12 2158 10/05/12 7846  10/05/12 1143  GLUCAP 96 111* 192* 117* 132*       Signed:  Kadejah Sandiford  [facility] 10/05/2012, 2:36 PM

## 2012-10-11 ENCOUNTER — Ambulatory Visit (INDEPENDENT_AMBULATORY_CARE_PROVIDER_SITE_OTHER): Payer: Medicare Other | Admitting: Internal Medicine

## 2012-10-11 ENCOUNTER — Encounter: Payer: Self-pay | Admitting: Internal Medicine

## 2012-10-11 VITALS — BP 122/74 | HR 76 | Temp 98.1°F | Ht 64.0 in | Wt 166.0 lb

## 2012-10-11 DIAGNOSIS — D869 Sarcoidosis, unspecified: Secondary | ICD-10-CM

## 2012-10-11 DIAGNOSIS — R0989 Other specified symptoms and signs involving the circulatory and respiratory systems: Secondary | ICD-10-CM

## 2012-10-11 DIAGNOSIS — I10 Essential (primary) hypertension: Secondary | ICD-10-CM

## 2012-10-11 DIAGNOSIS — R0609 Other forms of dyspnea: Secondary | ICD-10-CM

## 2012-10-11 DIAGNOSIS — R0600 Dyspnea, unspecified: Secondary | ICD-10-CM

## 2012-10-11 NOTE — Patient Instructions (Addendum)
Try off lisinopril and advair  If short of breath try albuterol  Up to 2 puffs every 4 hours if can't catch your breath  No change in prednisone dose for now  GERD (REFLUX)  is an extremely common cause of respiratory symptoms, many times with no significant heartburn at all.    It can be treated with medication, but also with lifestyle changes including avoidance of late meals, excessive alcohol, smoking cessation, and avoid fatty foods, chocolate, peppermint, colas, red wine, and acidic juices such as orange juice.  NO MINT OR MENTHOL PRODUCTS SO NO COUGH DROPS  USE SUGARLESS CANDY INSTEAD (jolley ranchers or Stover's)  NO OIL BASED VITAMINS - use powdered substitutes.  Please schedule a follow up office visit in 4  weeks, sooner if needed with pft and cxr and old records from [place]

## 2012-10-11 NOTE — Progress Notes (Signed)
  Subjective:    Patient ID: Charles [name], male    DOB: 1955-10-03  MRN: [id number]  HPI  57 yowm dx quit smoking in 1994   first aware of sob age 57 referred to pulmonary clinic f8/21/14 with dx of sarcoid   10/11/2012  1st [facility] pulmonary eval cc short of breath ? When did you first notice it ?  Mother "well the doctor's discovered" it dx with sarcoid in [place] and placed on prednisone since decades and maintained on prednisone and prinizil.  He was able to play sports in high school but mother says ?always needed advair (note advair wasn't made until he was 75) "well he was always on some pump" (interesting comment as he showed no capacity at all to use hfa).  Thru the interpreter after mutliple back and forths and asking the mother to let him answer the question, he has been variably sob since age 57, sometimes can't get across the room s giving out but never at rest, never sleeping, assoc with dry cough ? Since when  > on ACEi  Dx and rx as sarcoid by [name] pulmonary doc on pred 10 mg per day but not clear whether ever changed his breathing for the better and also on pred 10 mg per day  No obvious [pattern to daytime variabilty or  cp or chest tightness, subjective wheeze overt sinus or hb symptoms. No unusual exp hx or h/o childhood pna/ asthma or knowledge of premature birth.   Sleeping ok without nocturnal  or early am exacerbation  of respiratory  c/o's or need for noct saba. Also denies any obvious fluctuation of symptoms with weather or environmental changes or other aggravating or alleviating factors except as outlined above   Review of Systems  Constitutional: Negative for fever and unexpected weight change.  HENT: Positive for dental problem. Negative for ear pain, nosebleeds, congestion, sore throat, rhinorrhea, sneezing, trouble swallowing, postnasal drip and sinus pressure.   Eyes: Negative for redness and itching.  Respiratory: Positive for cough and shortness of breath.  Negative for chest tightness and wheezing.   Cardiovascular: Negative for palpitations and leg swelling.  Gastrointestinal: Negative for nausea and vomiting.  Genitourinary: Negative for dysuria.  Musculoskeletal: Negative for joint swelling.  Skin: Negative for rash.  Neurological: Negative for headaches.  Hematological: Does not bruise/bleed easily.  Psychiatric/Behavioral: Negative for dysphoric mood. The patient is not nervous/anxious.        Objective:   Physical Exam  W/c bound bm unalbe to express any consistent symptoms s mother interupting about what doctors say about his breathing.   HEENT: nl dentition, turbinates, and orophanx. Nl external ear canals without cough reflex   NECK :  without JVD/Nodes/TM/ nl carotid upstrokes bilaterally   LUNGS: no acc muscle use, clear to A and P bilaterally without cough on insp or exp maneuvers   CV:  RRR  no s3 or murmur or increase in P2, no edema   ABD:  soft and nontender with nl excursion in the supine position. No bruits or organomegaly, bowel sounds nl  MS:  warm without deformities, calf tenderness, cyanosis or clubbing  SKIN: warm and dry without lesions       cxr 10/02/12  No acute abnormality is noted.       Assessment & Plan:

## 2012-10-12 ENCOUNTER — Encounter: Payer: Self-pay | Admitting: Internal Medicine

## 2012-10-12 ENCOUNTER — Telehealth: Payer: Self-pay | Admitting: Neurology

## 2012-10-12 ENCOUNTER — Telehealth: Payer: Self-pay | Admitting: *Deleted

## 2012-10-12 DIAGNOSIS — R5381 Other malaise: Secondary | ICD-10-CM

## 2012-10-12 DIAGNOSIS — I10 Essential (primary) hypertension: Secondary | ICD-10-CM | POA: Insufficient documentation

## 2012-10-12 DIAGNOSIS — R5383 Other fatigue: Secondary | ICD-10-CM

## 2012-10-12 DIAGNOSIS — J449 Chronic obstructive pulmonary disease, unspecified: Secondary | ICD-10-CM | POA: Insufficient documentation

## 2012-10-12 MED ORDER — LOSARTAN POTASSIUM 50 MG PO TABS: 50.0000 mg | ORAL_TABLET | Freq: Every day | ORAL | Status: DC

## 2012-10-12 NOTE — Assessment & Plan Note (Signed)
The goal with a chronic steroid dependent illness is always arriving at the lowest effective dose that controls the disease/symptoms and not accepting a set "formula" which is based on statistics or guidelines that don't always take into account patient  variability or the natural hx of the dz in every individual patient, which may well vary over time.  For now therefore I recommend the patient maintain  A floor of 10 mg while trying to sort out why he's sob then taper off p I have a chance to review records at next ov p also making sure we have full and accurate med reconciliation.

## 2012-10-12 NOTE — Telephone Encounter (Signed)
Message copied by Maisie Fus on Fri Oct 12, 2012 10:03 AM ------      Message from: Sandrea Hughs B      Created: Fri Oct 12, 2012  8:14 AM       After reviewing records he does need a drug to replace the lisinopril I stopped and ov with Tammy in 2 weeks for med calendar and receck his bp            I sent him in cozaar 50 mg to take daily  ------

## 2012-10-12 NOTE — Assessment & Plan Note (Addendum)
Symptoms are markedly disproportionate to objective findings and not clear this is a lung problem but pt does appear to have difficult airway management issues. DDX of  difficult airways managment all start with A and  include Adherence, Ace Inhibitors, Acid Reflux, Active Sinus Disease, Alpha 1 Antitripsin deficiency, Anxiety masquerading as Airways dz,  ABPA,  allergy(esp in young), Aspiration (esp in elderly), Adverse effects of DPI,  Active smokers, plus two Bs  = Bronchiectasis and Beta blocker use..and one C= CHF  Adherence is always the initial "prime suspect" and is a multilayered concern that requires a "trust but verify" approach in every patient - starting with knowing how to use medications, especially inhalers, correctly, keeping up with refills and understanding the fundamental difference between maintenance and prns vs those medications only taken for a very short course and then stopped and not refilled. He is reliant on his brother for meds and not clear this is going to be effective way to maintain med rec which will be critical to his outpt management.   ACEi always at the top of the list of atypical symptoms > try off  ? Allergies/ asthma very unlikely if truly on pred 10 mg per day for sarcoid and adverse effect of dpi/ advair also on A list  The proper method of use, as well as anticipated side effects, of a metered-dose inhaler are discussed and demonstrated to the patient. Improved effectiveness after extensive coaching during this visit to a level of approximately  75% so just use saba prn and stop advair  ? Acid reflux / asp always a concern in cva pt > try diet only for now

## 2012-10-12 NOTE — Telephone Encounter (Signed)
Spoke with Mother Lambert Keto) --aware that medication has been called into pharmacy in [place].  Pt also scheduled for appt with TP in 2 weeks to follow up on BP and complete a med calendar. Aware to bring all medications with them to this visit.  10/25/12 at 10:15 w/TP

## 2012-10-12 NOTE — Assessment & Plan Note (Signed)

## 2012-10-15 ENCOUNTER — Telehealth: Payer: Self-pay | Admitting: Neurology

## 2012-10-15 NOTE — Telephone Encounter (Signed)
Appointment made with lynn lam

## 2012-10-24 ENCOUNTER — Encounter: Payer: Self-pay | Admitting: *Deleted

## 2012-10-24 ENCOUNTER — Telehealth: Payer: Self-pay | Admitting: Internal Medicine

## 2012-10-24 NOTE — Telephone Encounter (Signed)
I spoke with [employer] to clarify what was needed and they need on our letterhead the pt info and what records we are needing with the doc signature faxed to the number above and they will fax records. This has been done. Carron Curie, CMA

## 2012-10-25 ENCOUNTER — Ambulatory Visit (INDEPENDENT_AMBULATORY_CARE_PROVIDER_SITE_OTHER): Payer: Medicare Other | Admitting: Adult Health

## 2012-10-25 ENCOUNTER — Encounter: Payer: Self-pay | Admitting: Adult Health

## 2012-10-25 VITALS — BP 138/90 | HR 66 | Temp 97.5°F | Ht 64.0 in | Wt 166.0 lb

## 2012-10-25 DIAGNOSIS — D869 Sarcoidosis, unspecified: Secondary | ICD-10-CM

## 2012-10-25 DIAGNOSIS — I10 Essential (primary) hypertension: Secondary | ICD-10-CM

## 2012-10-25 NOTE — Addendum Note (Signed)
Addended by: Boone Master E on: 10/25/2012 11:53 AM   Modules accepted: Orders

## 2012-10-25 NOTE — Telephone Encounter (Signed)
duplicate

## 2012-10-25 NOTE — Assessment & Plan Note (Signed)
No flare in cough or dyspnea off Advair.  Patient's medications were reviewed today and patient education was given. Computerized medication calendar was adjusted/completed  Plan  follow up with Dr. Sherene Sires  For PFT in 3 weeks  Follow med calendar

## 2012-10-25 NOTE — Telephone Encounter (Signed)
Records request faxed to [facility] on letterhead with pt info Charles [name] to verify that she received this [employer] stated that the fax machine is not near her and if she has not received it, she will call the office Request placed in MW's scan folder Nothing further needed at this point, will sign off.

## 2012-10-25 NOTE — Telephone Encounter (Signed)
Had called pt's spouse yesterday evening at 5:30pm (epic had frozen and my computer needed to be shut down) Per spouse, she had just spoken to [facility] prior and was told that she had yet to speak with our office at all Informed spouse that Victorino Dike had spoken with [facility] directly earlier in the afternoon and the requested info was faxed Spouse provided [facility] phone and fax again for verification >> P: [phone]   F: [phone]  Called spoke with [employer] [facility] this morning Shayna verified that she indeed spoke with [place] yesterday afternoon and was expecting the records request to be faxed, but never received this Verified the fax number again with [facility]

## 2012-10-25 NOTE — Patient Instructions (Addendum)
Follow med calendar closely and bring to each visit.  Use ProAir Inhaler only as needed for wheezing /shortness of breath.  follow up Dr. Sherene Sires  In 3 weeks as planned and As needed

## 2012-10-25 NOTE — Assessment & Plan Note (Signed)
B/p doing well off ACE .  Cont on cozaar.

## 2012-10-25 NOTE — Progress Notes (Signed)
  Subjective:    Patient ID: Charles [name], male    DOB: 11-09-1955  MRN: [id number]  HPI 26 yowm dx quit smoking in 1994   first aware of sob age 57 referred to pulmonary clinic f8/21/14 with dx of sarcoid [name] -Interpretor  Hx of CVA w/ hemiplegia   10/11/2012  1st [facility] pulmonary eval cc short of breath ? When did you first notice it ?  Mother "well the doctor's discovered" it dx with sarcoid in [place] and placed on prednisone since decades and maintained on prednisone and prinizil.  He was able to play sports in high school but mother says ?always needed advair (note advair wasn't made until he was 25) "well he was always on some pump" (interesting comment as he showed no capacity at all to use hfa).  Thru the interpreter after mutliple back and forths and asking the mother to let him answer the question, he has been variably sob since age 44, sometimes can't get across the room s giving out but never at rest, never sleeping, assoc with dry cough ? Since when  > on ACEi  Dx and rx as sarcoid by Charles [name] pulmonary doc on pred 10 mg per day but not clear whether ever changed his breathing for the better and also on pred 10 mg per day  >>D/C ace and advair    10/25/2012 Follow up and Med review (Interpretor and family present for visit)  Patient returns for a two-week followup and medication review. We reviewed all his medications and organized them into medication calendar with patient education. ACE and Advair stopped last ov . No flare in cough or dyspnea. Uses ProAir most days in morning.  Remains on prednisone 10mg  daily  Appears to be taking meds correctly . Brother gives him his meds.  He denies chest pain, orthopnea, or edema.     Review of Systems  Constitutional: Negative for fever and unexpected weight change.  HENT: Positive for dental problem. Negative for ear pain, nosebleeds, congestion, sore throat, rhinorrhea, sneezing, trouble swallowing, postnasal drip and sinus  pressure.   Eyes: Negative for redness and itching.  Respiratory: Positive for cough  Negative for chest tightness and wheezing.   Cardiovascular: Negative for palpitations and leg swelling.  Gastrointestinal: Negative for nausea and vomiting.  Genitourinary: Negative for dysuria.  Musculoskeletal: Negative for joint swelling.  Skin: Negative for rash.  Neurological: Negative for headaches.  Hematological: Does not bruise/bleed easily.  Psychiatric/Behavioral: Negative for dysphoric mood. The patient is not nervous/anxious.        Objective:   Physical Exam  W/c bound bm  Deaf  Interpretor in room   HEENT: nl dentition, turbinates, and orophanx. Nl external ear canals without cough reflex   NECK :  without JVD/Nodes/TM/ nl carotid upstrokes bilaterally   LUNGS: no acc muscle use, clear to A and P bilaterally without cough on insp or exp maneuvers   CV:  RRR  no s3 or murmur or increase in P2, no edema   ABD:  soft and nontender with nl excursion in the supine position. No bruits or organomegaly, bowel sounds nl  MS:  warm without deformities, calf tenderness, cyanosis or clubbing  SKIN: warm and dry without lesions       cxr 10/02/12  No acute abnormality is noted.       Assessment & Plan:

## 2012-11-08 ENCOUNTER — Encounter: Payer: Self-pay | Admitting: Neurology

## 2012-11-08 ENCOUNTER — Ambulatory Visit (INDEPENDENT_AMBULATORY_CARE_PROVIDER_SITE_OTHER): Payer: Medicare Other | Admitting: Neurology

## 2012-11-08 VITALS — BP 161/87 | HR 71 | Ht 65.5 in | Wt 169.0 lb

## 2012-11-08 DIAGNOSIS — G8114 Spastic hemiplegia affecting left nondominant side: Secondary | ICD-10-CM | POA: Insufficient documentation

## 2012-11-08 DIAGNOSIS — G8110 Spastic hemiplegia affecting unspecified side: Secondary | ICD-10-CM
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
## 2012-11-08 NOTE — Patient Instructions (Addendum)
Overall you are doing fairly well but I do want to suggest a few things today:   As far as your medications are concerned, I would like to suggest using botulinum toxin therapy to help treat your symptoms. We will order this and contact you once insurance grants [place] approval.  Continue to use the baclofen for muscle spasticity. Take an extra half tablet once a day as needed for severe spasms.  My clinical assistant and will answer any of your questions and relay your messages to me and also relay most of my messages to you.   Our phone number is [phone]. We also have an after hours call service for urgent matters and there is a physician on-call for urgent questions. For any emergencies you know to call 911 or go to the nearest emergency room

## 2012-11-19 ENCOUNTER — Ambulatory Visit (INDEPENDENT_AMBULATORY_CARE_PROVIDER_SITE_OTHER)
Admission: RE | Admit: 2012-11-19 | Discharge: 2012-11-19 | Disposition: A | Discharge status: Home / Self Care | Payer: Medicare Other | Source: Ambulatory Visit | Attending: Internal Medicine | Admitting: Internal Medicine

## 2012-11-19 ENCOUNTER — Ambulatory Visit (INDEPENDENT_AMBULATORY_CARE_PROVIDER_SITE_OTHER): Payer: Medicare Other | Admitting: Internal Medicine

## 2012-11-19 ENCOUNTER — Other Ambulatory Visit: Payer: Self-pay | Admitting: Internal Medicine

## 2012-11-19 ENCOUNTER — Encounter: Payer: Self-pay | Admitting: Internal Medicine

## 2012-11-19 VITALS — BP 132/80 | HR 77 | Temp 97.2°F | Ht 64.0 in | Wt 173.0 lb

## 2012-11-19 DIAGNOSIS — R911 Solitary pulmonary nodule: Secondary | ICD-10-CM

## 2012-11-19 DIAGNOSIS — D869 Sarcoidosis, unspecified: Secondary | ICD-10-CM

## 2012-11-19 DIAGNOSIS — R0600 Dyspnea, unspecified: Secondary | ICD-10-CM

## 2012-11-19 DIAGNOSIS — R0609 Other forms of dyspnea: Secondary | ICD-10-CM

## 2012-11-19 MED ORDER — FLUTICASONE FUROATE-VILANTEROL 100-25 MCG/INH IN AEPB: 1.0000 | INHALATION_SPRAY | Freq: Every morning | RESPIRATORY_TRACT | Status: DC

## 2012-11-19 MED ORDER — FAMOTIDINE 20 MG PO TABS: ORAL_TABLET | ORAL | Status: DC

## 2012-11-19 NOTE — Progress Notes (Signed)
Subjective:    Patient ID: Charles [name], male    DOB: 12-06-1955    MRN: [id number]  Brief patient profile:  39 yowm dx quit smoking in 1994 first aware of sob age 57 referred to pulmonary clinic 10/11/12 with dx of sarcoid   History of Present Illness   Charles [name] -Interpretor  Hx of CVA w/ hemiplegia   10/11/2012  1st Charles [name] pulmonary eval cc short of breath ? When did you first notice it ?  Mother "well the doctor's discovered" it dx with sarcoid in [place] and placed on prednisone since decades and maintained on prednisone and prinizil.  He was able to play sports in high school but mother says ?always needed advair (note advair wasn't made until he was 74) "well he was always on some pump" (interesting comment as he showed no capacity at all to use hfa).  Thru the interpreter after mutliple back and forths and asking the mother to let him answer the question, he has been variably sob since age 46, sometimes can't get across the room s giving out but never at rest, never sleeping, assoc with dry cough ? Since when  > on ACEi  Dx and rx as sarcoid by Charles [name] pulmonary doc on pred 10 mg per day but not clear whether ever changed his breathing for the better and so maint on pred 10 mg per day  >>D/C ace and advair    10/25/2012 Follow up and Med review (Interpretor and family present for visit)  Patient returns for a two-week followup and medication review. We reviewed all his medications and organized them into medication calendar with patient education. ACE and Advair stopped last ov . No flare in cough or dyspnea. Uses ProAir most days in morning.  Remains on prednisone 10mg  daily  Appears to be taking meds correctly . Brother gives him his meds.  He denies chest pain, orthopnea, or edema.  rec Follow med calendar closely and bring to each visit.  Use ProAir Inhaler only as needed for wheezing /shortness of breath    11/19/2012 f/u ov/Charles [name] re: sarcoid/asthma/copd  On pred 10 mg  daily did not bring calendar not apparently using it Chief Complaint  Patient presents with  . Follow-up    pt reports breathing is about the same since last OV, breathing is "different and harder " per mother patients has increased wheezing --  will be having surgery soon for tooth extraction  sob x 75 ft per brother Brother confused with med instructions re saba  despite calendar, says doe x across the room every time he tries. Noct spells of choking ever since stroke but swallowing ok  No obvious day to day or daytime variabilty or assoc chronic cough or cp or chest tightness, subjective wheeze overt sinus or hb symptoms. No unusual exp hx or h/o childhood pna/ asthma or knowledge of premature birth.    Also denies any obvious fluctuation of symptoms with weather or environmental changes or other aggravating or alleviating factors except as outlined above   Current Medications, Allergies, Complete Past Medical History, Past Surgical History, Family History, and Social History were reviewed in [employer] record.  ROS  The following are not active complaints unless bolded sore throat, dysphagia, dental problems, itching, sneezing,  nasal congestion or excess/ purulent secretions, ear ache,   fever, chills, sweats, unintended wt loss, pleuritic or exertional cp, hemoptysis,  orthopnea pnd or leg swelling, presyncope, palpitations, heartburn, abdominal pain, anorexia, nausea, vomiting,  diarrhea  or change in bowel or urinary habits, change in stools or urine, dysuria,hematuria,  rash, arthralgias, visual complaints, headache, numbness weakness or ataxia or problems with walking or coordination,  change in mood/affect or memory.              Objective:   Physical Exam  W/c bound bm  Charles   Interpretor in room   HEENT: nl dentition, turbinates, and orophanx. Nl external ear canals without cough reflex   NECK :  without JVD/Nodes/TM/ nl carotid upstrokes  bilaterally   LUNGS: no acc muscle use, clear to A and P bilaterally without cough on insp or exp maneuvers   CV:  RRR  no s3 or murmur or increase in P2, no edema   ABD:  soft and nontender with nl excursion in the supine position. No bruits or organomegaly, bowel sounds nl  MS:  warm without deformities, calf tenderness, cyanosis or clubbing  SKIN: warm and dry without lesions      CXR  11/19/2012 :    Parenchymal scarring and calcified thoracic adenopathy consistent with history of sarcoidosis.  Question new 14 x 11 mm right upper lobe nodular density, not visualized on the previous exam.      Assessment & Plan:

## 2012-11-19 NOTE — Assessment & Plan Note (Addendum)
-   ex intol since age 57 -11/19/2012   Walked RA x one lap @ 185 stopped due to  Legs gave out no desat - trial off acei 10/12/2012  - Spirometry 11/19/2012  FEV1  1.29 (49) ratio 55   So he has mod airiflow obst but not clear whether this is chronic asthma related or copd or sarcoid causing the pattern but since ? Worse off advair will try breo one puff each am and use  proair prn.  The proper method of use, as well as anticipated side effects, of a metered-dose inhaler are discussed and demonstrated to the patient. Improved effectiveness after extensive coaching during this visit to a level of approximately  90% with breo, < 10% with proair    Each maintenance medication was reviewed in detail including most importantly the difference between maintenance and as needed and under what circumstances the prns are to be used. This was done in the context of a medication calendar review (which was already provided on last ov but which they denied receiving, though that was the entire focus of the visit)  which provided the patient with a user-friendly unambiguous mechanism for medication administration and reconciliation and provides an action plan for all active problems. It is critical that this be shown to every doctor  for modification during the office visit if necessary so the patient can use it as a working document.

## 2012-11-19 NOTE — Patient Instructions (Addendum)
Try reducing prednisone to one half daily   Add pepcid 20 mg one at bedtime  Add Breo one puff each am  See calendar for specific medication instructions and bring it back for each and every office visit for every healthcare provider you see.  Without it,  you may not receive the best quality medical care that we feel you deserve.  You will note that the calendar groups together  your maintenance  medications that are timed at particular times of the day.  Think of this as your checklist for what your doctor has instructed you to do until your next evaluation to see what benefit  there is  to staying on a consistent group of medications intended to keep you well.  The other group at the bottom is entirely up to you to use as you see fit  for specific symptoms that may arise between visits that require you to treat them on an as needed basis.  Think of this as your action plan or "what if" list.   Separating the top medications from the bottom group is fundamental to providing you adequate care going forward.    Please schedule a follow up office visit in 6 weeks, call sooner if needed with pfts

## 2012-11-20 DIAGNOSIS — R911 Solitary pulmonary nodule: Secondary | ICD-10-CM | POA: Insufficient documentation

## 2012-11-20 NOTE — Assessment & Plan Note (Signed)
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

## 2012-11-20 NOTE — Assessment & Plan Note (Signed)
no records in emr 11/20/2012  - reduced prednisone to 5 mg daily 11/19/2012 >>>  The goal with a chronic steroid dependent illness is always arriving at the lowest effective dose that controls the disease/symptoms and not accepting a set "formula" which is based on statistics or guidelines that don't always take into account patient  variability or the natural hx of the dz in every individual patient, which may well vary over time.  For now therefore I recommend the patient maintain  A floor of 5 mg daily

## 2012-11-29 ENCOUNTER — Encounter (HOSPITAL_COMMUNITY): Payer: Self-pay | Admitting: Emergency Medicine

## 2012-11-29 ENCOUNTER — Observation Stay (HOSPITAL_COMMUNITY)
Admission: EM | Admit: 2012-11-29 | Discharge: 2012-12-01 | Disposition: A | Discharge status: Home / Self Care | Payer: Medicare Other | Attending: Internal Medicine | Admitting: Internal Medicine

## 2012-11-29 ENCOUNTER — Emergency Department (HOSPITAL_COMMUNITY): Payer: Medicare Other

## 2012-11-29 DIAGNOSIS — Z7902 Long term (current) use of antithrombotics/antiplatelets: Secondary | ICD-10-CM | POA: Insufficient documentation

## 2012-11-29 DIAGNOSIS — H538 Other visual disturbances: Secondary | ICD-10-CM | POA: Insufficient documentation

## 2012-11-29 DIAGNOSIS — I69959 Hemiplegia and hemiparesis following unspecified cerebrovascular disease affecting unspecified side: Secondary | ICD-10-CM | POA: Insufficient documentation

## 2012-11-29 DIAGNOSIS — Z23 Encounter for immunization: Secondary | ICD-10-CM | POA: Insufficient documentation

## 2012-11-29 DIAGNOSIS — IMO0002 Reserved for concepts with insufficient information to code with codable children: Secondary | ICD-10-CM | POA: Insufficient documentation

## 2012-11-29 DIAGNOSIS — IMO0001 Reserved for inherently not codable concepts without codable children: Secondary | ICD-10-CM | POA: Insufficient documentation

## 2012-11-29 DIAGNOSIS — E119 Type 2 diabetes mellitus without complications: Secondary | ICD-10-CM | POA: Diagnosis present

## 2012-11-29 DIAGNOSIS — Z79899 Other long term (current) drug therapy: Secondary | ICD-10-CM | POA: Insufficient documentation

## 2012-11-29 DIAGNOSIS — H913 Deaf nonspeaking, not elsewhere classified: Secondary | ICD-10-CM | POA: Insufficient documentation

## 2012-11-29 DIAGNOSIS — I10 Essential (primary) hypertension: Secondary | ICD-10-CM | POA: Diagnosis present

## 2012-11-29 DIAGNOSIS — M25519 Pain in unspecified shoulder: Secondary | ICD-10-CM | POA: Insufficient documentation

## 2012-11-29 DIAGNOSIS — G8114 Spastic hemiplegia affecting left nondominant side: Secondary | ICD-10-CM | POA: Diagnosis present

## 2012-11-29 DIAGNOSIS — D869 Sarcoidosis, unspecified: Secondary | ICD-10-CM | POA: Insufficient documentation

## 2012-11-29 DIAGNOSIS — G8110 Spastic hemiplegia affecting unspecified side: Secondary | ICD-10-CM

## 2012-11-29 DIAGNOSIS — I639 Cerebral infarction, unspecified: Secondary | ICD-10-CM

## 2012-11-29 DIAGNOSIS — G459 Transient cerebral ischemic attack, unspecified: Secondary | ICD-10-CM

## 2012-11-29 DIAGNOSIS — R51 Headache: Principal | ICD-10-CM | POA: Insufficient documentation

## 2012-11-29 HISTORY — DX: Unspecified hearing loss, unspecified ear: H91.90

## 2012-11-29 MED ORDER — DEXAMETHASONE SODIUM PHOSPHATE 10 MG/ML IJ SOLN
10.0000 mg | Freq: Once | INTRAMUSCULAR | Status: AC
  Administered 2012-11-29: 10 mg via INTRAVENOUS
  Filled 2012-11-29: qty 1

## 2012-11-29 MED ORDER — DIPHENHYDRAMINE HCL 50 MG/ML IJ SOLN
25.0000 mg | Freq: Once | INTRAMUSCULAR | Status: AC
  Administered 2012-11-29: 25 mg via INTRAVENOUS
  Filled 2012-11-29: qty 1

## 2012-11-29 MED ORDER — KETOROLAC TROMETHAMINE 30 MG/ML IJ SOLN: 30.0000 mg | Freq: Once | INTRAMUSCULAR | Status: DC

## 2012-11-29 MED ORDER — SODIUM CHLORIDE 0.9 % IV BOLUS (SEPSIS)
1000.0000 mL | Freq: Once | INTRAVENOUS | Status: AC
  Administered 2012-11-29: 1000 mL via INTRAVENOUS

## 2012-11-29 MED ORDER — METOCLOPRAMIDE HCL 5 MG/ML IJ SOLN
10.0000 mg | Freq: Once | INTRAMUSCULAR | Status: AC
  Administered 2012-11-29: 10 mg via INTRAVENOUS
  Filled 2012-11-29: qty 2

## 2012-11-29 NOTE — ED Notes (Signed)
Called for interpreter 

## 2012-11-29 NOTE — ED Notes (Signed)
Pt to ED via [facility] for evaluation of a right sided headache onset 2 hours ago.  Pt has hx of a stroke from 3 years ago- left sided deficits noted.  Alert and oriented X 4 upon arrival to ED, neuro exam negative.  Pt is deaf.

## 2012-11-29 NOTE — ED Provider Notes (Signed)
Charles [name] S 8:00 PM patient discussed in sign out. Patient is hearing impaired presenting with complaints of severe headache onset 2 hours ago. CT of head negative for acute process. Plans for lumbar puncture performed by radiology to rule out subarachnoid hemorrhage.   Patient was unable to tolerate lying flat for fluoroscopy and LP. I reevaluated patient with sign interpreter. Currently does not have a headache. Family still very concerned about the cause for his symptoms. They're also concerned that he had similar symptoms recently with his stroke. At this time will plan to admit for observation and possible TIA workup  Spoke with Dr. Betti Cruz with [facility]. He will see patient and admit.  Angus Seller, PA-C 11/30/12 [phone]
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
## 2012-11-29 NOTE — Progress Notes (Signed)
Unsuccessful attempted lumbar puncture under fluoro.  The patient can not assume the prone position due to left shoulder injury and recreased ROM.  Attempted to position patient for 30 minutes without success.    Will return to ER for attempted bedisde LP if still clinically desired.   Explained to patient and family.  Signed,  Sterling Big, MD [facility]

## 2012-11-30 ENCOUNTER — Encounter (HOSPITAL_COMMUNITY): Payer: Self-pay | Admitting: Internal Medicine

## 2012-11-30 ENCOUNTER — Observation Stay (HOSPITAL_COMMUNITY): Payer: Medicare Other

## 2012-11-30 DIAGNOSIS — H913 Deaf nonspeaking, not elsewhere classified: Secondary | ICD-10-CM

## 2012-11-30 DIAGNOSIS — R51 Headache: Secondary | ICD-10-CM

## 2012-11-30 DIAGNOSIS — I10 Essential (primary) hypertension: Secondary | ICD-10-CM

## 2012-11-30 LAB — CBC
HCT: 36.2 % — ABNORMAL LOW (ref 39.0–52.0)
Hemoglobin: 13.1 g/dL (ref 13.0–17.0)
MCV: 85.4 fL (ref 78.0–100.0)
RBC: 4.24 MIL/uL (ref 4.22–5.81)
RDW: 12.5 % (ref 11.5–15.5)
WBC: 8.3 10*3/uL (ref 4.0–10.5)

## 2012-11-30 LAB — BASIC METABOLIC PANEL
BUN: 9 mg/dL (ref 6–23)
CO2: 27 mEq/L (ref 19–32)
Chloride: 102 mEq/L (ref 96–112)
GFR calc Af Amer: >90 mL/min (ref >90)
Potassium: 3.9 mEq/L (ref 3.5–5.1)

## 2012-11-30 LAB — GLUCOSE, CAPILLARY
Glucose-Capillary: 169 mg/dL — ABNORMAL HIGH (ref 70–99)
Glucose-Capillary: 182 mg/dL — ABNORMAL HIGH (ref 70–99)
Glucose-Capillary: 220 mg/dL — ABNORMAL HIGH (ref 70–99)

## 2012-11-30 LAB — HEMOGLOBIN A1C
Hgb A1c MFr Bld: 6.2 % — ABNORMAL HIGH (ref <5.7)
Mean Plasma Glucose: 131 mg/dL — ABNORMAL HIGH (ref <117)

## 2012-11-30 LAB — POCT I-STAT, CHEM 8
Calcium, Ion: 1.19 mmol/L (ref 1.12–1.23)
Chloride: 103 mEq/L (ref 96–112)
HCT: 40 % (ref 39.0–52.0)
Hemoglobin: 13.6 g/dL (ref 13.0–17.0)
Potassium: 3.8 mEq/L (ref 3.5–5.1)
Sodium: 141 mEq/L (ref 135–145)

## 2012-11-30 LAB — CBC WITH DIFFERENTIAL/PLATELET
Basophils Relative: 0 % (ref 0–1)
Eosinophils Absolute: 0 10*3/uL (ref 0.0–0.7)
HCT: 36.8 % — ABNORMAL LOW (ref 39.0–52.0)
Hemoglobin: 13.2 g/dL (ref 13.0–17.0)
Lymphocytes Relative: 11 % — ABNORMAL LOW (ref 12–46)
MCH: 30.8 pg (ref 26.0–34.0)
MCHC: 35.9 g/dL (ref 30.0–36.0)
MCV: 86 fL (ref 78.0–100.0)
Monocytes Relative: 1 % — ABNORMAL LOW (ref 3–12)
Neutro Abs: 6.3 10*3/uL (ref 1.7–7.7)
Neutrophils Relative %: 88 % — ABNORMAL HIGH (ref 43–77)
Platelets: 120 10*3/uL — ABNORMAL LOW (ref 150–400)
RBC: 4.28 MIL/uL (ref 4.22–5.81)

## 2012-11-30 LAB — TROPONIN I: Troponin I: <0.3 ng/mL (ref <0.30)

## 2012-11-30 LAB — LIPID PANEL
HDL: 79 mg/dL (ref >39)
LDL Cholesterol: 77 mg/dL (ref 0–99)
VLDL: 8 mg/dL (ref 0–40)

## 2012-11-30 LAB — SEDIMENTATION RATE: Sed Rate: 2 mm/hr (ref 0–16)

## 2012-11-30 LAB — PROTIME-INR
INR: 0.98 (ref 0.00–1.49)
Prothrombin Time: 12.8 seconds (ref 11.6–15.2)

## 2012-11-30 LAB — C-REACTIVE PROTEIN: CRP: <0.5 mg/dL — ABNORMAL LOW (ref <0.60)

## 2012-11-30 MED ORDER — ASPIRIN 300 MG RE SUPP
300.0000 mg | Freq: Every day | RECTAL | Status: DC
  Filled 2012-11-30 (×2): qty 1

## 2012-11-30 MED ORDER — INFLUENZA VAC SPLIT QUAD 0.5 ML IM SUSP
0.5000 mL | INTRAMUSCULAR | Status: AC
  Administered 2012-12-01: 0.5 mL via INTRAMUSCULAR
  Filled 2012-11-30 (×2): qty 0.5

## 2012-11-30 MED ORDER — MOMETASONE FURO-FORMOTEROL FUM 100-5 MCG/ACT IN AERO
2.0000 | INHALATION_SPRAY | Freq: Every morning | RESPIRATORY_TRACT | Status: DC
  Administered 2012-11-30 – 2012-12-01 (×2): 2 via RESPIRATORY_TRACT
  Filled 2012-11-30 (×2): qty 8.8

## 2012-11-30 MED ORDER — CLOPIDOGREL BISULFATE 75 MG PO TABS
75.0000 mg | ORAL_TABLET | Freq: Every day | ORAL | Status: DC
  Administered 2012-11-30 – 2012-12-01 (×2): 75 mg via ORAL
  Filled 2012-11-30 (×3): qty 1

## 2012-11-30 MED ORDER — LOSARTAN POTASSIUM 50 MG PO TABS
50.0000 mg | ORAL_TABLET | Freq: Every day | ORAL | Status: DC
  Administered 2012-11-30 – 2012-12-01 (×2): 50 mg via ORAL
  Filled 2012-11-30 (×2): qty 1

## 2012-11-30 MED ORDER — PREDNISONE 10 MG PO TABS
10.0000 mg | ORAL_TABLET | Freq: Every morning | ORAL | Status: DC
  Administered 2012-11-30 – 2012-12-01 (×2): 10 mg via ORAL
  Filled 2012-11-30 (×2): qty 1

## 2012-11-30 MED ORDER — ACETAMINOPHEN 325 MG PO TABS: 650.0000 mg | ORAL_TABLET | ORAL | Status: DC | PRN

## 2012-11-30 MED ORDER — POLYETHYLENE GLYCOL 3350 17 G PO PACK
17.0000 g | PACK | Freq: Every day | ORAL | Status: DC
  Administered 2012-11-30: 17 g via ORAL
  Filled 2012-11-30 (×2): qty 1

## 2012-11-30 MED ORDER — ASPIRIN 325 MG PO TABS
325.0000 mg | ORAL_TABLET | Freq: Every day | ORAL | Status: DC
  Administered 2012-11-30 – 2012-12-01 (×2): 325 mg via ORAL
  Filled 2012-11-30 (×2): qty 1

## 2012-11-30 MED ORDER — BACLOFEN 10 MG PO TABS
10.0000 mg | ORAL_TABLET | Freq: Two times a day (BID) | ORAL | Status: DC
  Administered 2012-11-30 – 2012-12-01 (×3): 10 mg via ORAL
  Filled 2012-11-30 (×5): qty 1

## 2012-11-30 MED ORDER — SENNOSIDES-DOCUSATE SODIUM 8.6-50 MG PO TABS: 1.0000 | ORAL_TABLET | Freq: Every evening | ORAL | Status: DC | PRN

## 2012-11-30 MED ORDER — FAMOTIDINE 20 MG PO TABS
20.0000 mg | ORAL_TABLET | Freq: Every day | ORAL | Status: DC
  Administered 2012-11-30: 20 mg via ORAL
  Filled 2012-11-30 (×2): qty 1

## 2012-11-30 MED ORDER — PANTOPRAZOLE SODIUM 40 MG PO TBEC
40.0000 mg | DELAYED_RELEASE_TABLET | Freq: Two times a day (BID) | ORAL | Status: DC
  Administered 2012-11-30 – 2012-12-01 (×3): 40 mg via ORAL
  Filled 2012-11-30 (×3): qty 1

## 2012-11-30 MED ORDER — INSULIN ASPART 100 UNIT/ML ~~LOC~~ SOLN
0.0000 [IU] | Freq: Every day | SUBCUTANEOUS | Status: DC
  Administered 2012-11-30: 2 [IU] via SUBCUTANEOUS

## 2012-11-30 MED ORDER — ATORVASTATIN CALCIUM 10 MG PO TABS
10.0000 mg | ORAL_TABLET | Freq: Every day | ORAL | Status: DC
  Administered 2012-11-30 – 2012-12-01 (×2): 10 mg via ORAL
  Filled 2012-11-30 (×2): qty 1

## 2012-11-30 MED ORDER — SODIUM CHLORIDE 0.9 % IV SOLN
INTRAVENOUS | Status: DC
  Administered 2012-11-30: 05:00:00 via INTRAVENOUS

## 2012-11-30 MED ORDER — INSULIN ASPART 100 UNIT/ML ~~LOC~~ SOLN
0.0000 [IU] | Freq: Three times a day (TID) | SUBCUTANEOUS | Status: DC
  Administered 2012-11-30 (×2): 2 [IU] via SUBCUTANEOUS
  Administered 2012-12-01: 1 [IU] via SUBCUTANEOUS

## 2012-11-30 MED ORDER — ACETAMINOPHEN 650 MG RE SUPP: 650.0000 mg | RECTAL | Status: DC | PRN

## 2012-11-30 NOTE — ED Notes (Signed)
Spoke to [employer] from [facility] at [phone]. Will send interpreter to 4N.

## 2012-11-30 NOTE — Progress Notes (Signed)
   CARE MANAGEMENT NOTE 11/30/2012  Patient:  Charles [name], Charles [name]   Account Number:  [id number]  Date Initiated:  11/30/2012  Documentation initiated by:  Jiles Crocker  Subjective/Objective Assessment:   ADMITTED WITH BLURRED VISION, H/A; HISTORY OF OLD CVA     Action/Plan:   PCP IS DR ASHISH SHAH  LIVES WITH FAMILY MEMBERS; IS ACTIVE WITH [facility] AS PRIOR TO ADMISSION   Anticipated DC Date:  12/01/2012   Anticipated DC Plan:  HOME W HOME HEALTH SERVICES         Choice offered to / List presented to:             Status of service:  In process, will continue to follow Medicare Important Message given?  NA - LOS <3 / Initial given by admissions (If response is "NO", the following Medicare IM given date fields will be blank) Date Medicare IM given:   Date Additional Medicare IM given:    Discharge Disposition:    Per UR Regulation:  Reviewed for med. necessity/level of care/duration of stay  If discussed at Long Length of Stay Meetings, dates discussed:    Comments:  11/30/2012- B Chalsey Leeth RN,BSN,MHA
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
## 2012-11-30 NOTE — ED Provider Notes (Signed)
Medical screening examination/treatment/procedure(s) were performed by non-physician practitioner and as supervising physician I was immediately available for consultation/collaboration.  Patient discussed with me original plan was for LP to rule out SAH due to acute onset of severe headache at 1500.   Shelda Jakes, MD 11/30/12 1120

## 2012-11-30 NOTE — Progress Notes (Signed)
Unable to perform NIH stroke scale at present time.Patient is deaf and a interpreter is needed.Interpreter to come this morning around 0800 .Will report off to day shift nurse.

## 2012-11-30 NOTE — Progress Notes (Signed)
[facility] PROGRESS NOTE  Charles [name]:[id number] DOB: May 04, 1955 DOA: 11/29/2012 PCP: Kirstie Peri, MD  Assessment/Plan: 1. Headache. Patient with history of CVA, presented overnight with complaints of right-sided headache. The patient reports improvement this morning. He was seen and evaluated by neurology overnight, suspect may reflect migraine headaches. ESR was obtained and within normal limits at 2, as temporal arteritis is an unlikely cause of headaches. Pending MRI of brain without contrast as well as EEG. Will follow.  2. Left shoulder pain patient having a fall approximately 2 weeks ago, hasn't seen orthopedic surgery. A left shoulder x-ray performed on admission did not reveal acute fracture, deformity or dislocation.  3. Hypertension. Blood pressure this morning 138/91, will restart his losartan 50 mg by mouth daily 4. Type 2 diabetes mellitus. Continue Accu-Cheks q. a.c. each bedtime with status he'll coverage. 5. History of sarcoidosis. Continue steroids 6. Congenital deafness. Stable, chronic  Code Status: Full code Family Communication: Plan was discussed with family members were present at bedside Disposition Plan: Followup on MRI of brain and EEG.   Consultants:  Neurology   HPI/Subjective: Patient is a pleasant 57 year old admitted overnight, presented with complaints of a right-sided headache. He has had recurrent headaches since his CVA. Patient was seen and evaluated by neurology overnight. Sedimentation rate, back within normal limits, MRI and EEG are pending at the time of this dictation.  Objective: Filed Vitals:   11/30/12 0600  BP: 138/91  Pulse: 70  Temp: 97.7 F (36.5 C)  Resp: 18    Intake/Output Summary (Last 24 hours) at 11/30/12 1010 Last data filed at 11/30/12 0600  Gross per 24 hour  Intake  91.25 ml  Output    250 ml  Net -158.75 ml   Filed Weights   11/30/12 0358  Weight: 74.9 kg (165 lb 2 oz)    Exam:   General:  No  acute distress, reports feeling better.   Cardiovascular: Regular rate and rhythm normal S1-S2  Respiratory: Clear to auscultation bilaterally  Abdomen: Soft nontender nondistended  Musculoskeletal: Preserved range of motion to all extremities  Neurologic: Patient having nonfocal neurologic examination, cranial nerves 2 through 12 are grossly intact 5 of 5 muscle strength to all extremities no alteration to sensation   Data Reviewed: Basic Metabolic Panel:  Recent Labs Lab 11/30/12 0029 11/30/12 0545  NA 141 139  K 3.8 3.9  CL 103 102  CO2  --  27  GLUCOSE 166* 163*  BUN 8 9  CREATININE 1.30 0.91  CALCIUM  --  8.9   Liver Function Tests: No results found for this basename: AST, ALT, ALKPHOS, BILITOT, PROT, ALBUMIN,  in the last 168 hours No results found for this basename: LIPASE, AMYLASE,  in the last 168 hours No results found for this basename: AMMONIA,  in the last 168 hours CBC:  Recent Labs Lab 11/30/12 0020 11/30/12 0029 11/30/12 0545  WBC 7.2  --  8.3  NEUTROABS 6.3  --   --   HGB 13.2 13.6 13.1  HCT 36.8* 40.0 36.2*  MCV 86.0  --  85.4  PLT 120*  --  126*   Cardiac Enzymes:  Recent Labs Lab 11/30/12 0044  TROPONINI <0.30   BNP (last 3 results) No results found for this basename: PROBNP,  in the last 8760 hours CBG:  Recent Labs Lab 11/30/12 0357  GLUCAP 169*    No results found for this or any previous visit (from the past 240 hour(s)).   Studies: Ct  Head Wo Contrast  11/29/2012   CLINICAL DATA:  Right-sided headache onset 2 hr ago. Prior stroke. Left side deficits.  EXAM: CT HEAD WITHOUT CONTRAST  TECHNIQUE: Contiguous axial images were obtained from the base of the skull through the vertex without intravenous contrast.  COMPARISON:  MRI 10/02/2012.  FINDINGS: Left MCA distribution encephalomalacia is present. Atrophy and chronic ischemic white matter disease is also present. Compared to the recent MRI, there is no interval change. No acute  intracranial abnormality. No mass lesion, midline shift or hydrocephalus. The calvarium appears intact. Mastoid air cells appear clear. There is an old right medial orbital wall blowout fracture.  IMPRESSION: No acute intracranial abnormality. Atrophy, chronic ischemic white matter disease and old right MCA distribution infarct appears similar to the prior study.   Electronically Signed   By: Andreas Newport M.D.   On: 11/29/2012 19:43   Dg Shoulder Left  11/30/2012   *RADIOLOGY REPORT*  Clinical Data: Pain after fall.  LEFT SHOULDER - 2+ VIEW  Comparison: None available at time of study interpretation.  Findings: The humeral head is well-formed and located.  The subacromial, glenohumeral and acromioclavicular joint spaces are intact.  No destructive bony lesions.  Soft tissue planes are non- suspicious.  Included view of the chest demonstrates calcified left hilar calcified lymph nodes.  IMPRESSION: No acute fracture deformity nor dislocation.   Original Report Authenticated By: Awilda Metro    Scheduled Meds: . aspirin  300 mg Rectal Daily   Or  . aspirin  325 mg Oral Daily  . atorvastatin  10 mg Oral Daily  . baclofen  10 mg Oral BID  . clopidogrel  75 mg Oral Q breakfast  . famotidine  20 mg Oral QHS  . [START ON 12/01/2012] influenza vac split quadrivalent PF  0.5 mL Intramuscular Tomorrow-1000  . insulin aspart  0-5 Units Subcutaneous QHS  . insulin aspart  0-9 Units Subcutaneous TID WC  . losartan  50 mg Oral Daily  . mometasone-formoterol  2 puff Inhalation q morning - 10a  . pantoprazole  40 mg Oral BID AC  . polyethylene glycol  17 g Oral QHS  . predniSONE  10 mg Oral q morning - 10a   Continuous Infusions: . sodium chloride 75 mL/hr at 11/30/12 0447    Principal Problem:   Headache Active Problems:   CVA (cerebral vascular accident)   Type II or unspecified type diabetes mellitus without mention of complication, uncontrolled   Sarcoidosis   Deaf mutism, congenital    Essential hypertension, benign   Spastic hemiplegia affecting nondominant side    Time spent: 35 minutes    Jeralyn Bennett  [facility] Pager [phone]. If 7PM-7AM, please contact night-coverage at [email or web address], password [facility] 11/30/2012, 10:10 AM  LOS: 1 day
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
## 2012-11-30 NOTE — Procedures (Signed)
EEG report.  Brief clinical history: 58 year old male presenting with headache and blurred vision. All symptoms have resolved with treatment of headache. No prior history of frank epileptic seizures.  Technique: this is a 17 channel routine scalp EEG performed at the bedside with bipolar and monopolar montages arranged in accordance to the international 10/20 system of electrode placement. One channel was dedicated to EKG recording.  The study was performed during wakefulness, drowsiness, and stage 2 sleep. No activating procedures performed.  Description:In the wakeful state, the best background consisted of a medium amplitude, posterior dominant, well sustained, symmetric and reactive 10 Hz rhythm. Drowsiness demonstrated dropout of the alpha rhythm. Stage 2 sleep showed symmetric and synchronous sleep spindles without intermixed epileptiform discharges.  No focal or generalized epileptiform discharges noted.  No slowing seen.  EKG showed sinus rhythm.  Impression: this is a normal awake and asleep EEG. Please, be aware that a normal EEG does not exclude the possibility of epilepsy.  Clinical correlation is advised.  Wyatt Portela, MD
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
## 2012-11-30 NOTE — Progress Notes (Signed)
EEG Completed; Results Pending  
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
## 2012-11-30 NOTE — Progress Notes (Signed)
CSW received call from [employer] that patient will require sign language interpreters again in the a.m.  CSW spoke to [employer]- Surveyor, minerals- she will arrange for interpreter to be in patient's room by 7:45 am tomorrow and each morning until d/c.  Interpreter will be available/present for MD rounds etc.  Lupita Leash T. West Pugh  [phone]

## 2012-11-30 NOTE — Evaluation (Signed)
Physical Therapy Evaluation Patient Details Name: Charles [name] MRN: [id number] DOB: 1955/12/04 Today's Date: 11/30/2012 Time: 1215-1240 PT Time Calculation (min): 25 min  PT Assessment / Plan / Recommendation History of Present Illness  57 y/o male admitted with severe headache.  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  He is deaf and mute.  Clinical Impression  Patient presents close to recent baseline.  Feels he has decreased left ankle mobility/control and looks very spastic in his gait.  Wonder if fluids given have increased left ankle edema and decreased coordinated movement even further.  Noted looking into Botox and agree may improve function.  Will follow acutely and recommend resume outpatient PT upon d/c.      PT Assessment  Patient needs continued PT services    Follow Up Recommendations  Outpatient PT    Does the patient have the potential to tolerate intense rehabilitation    N/A  Barriers to Discharge   None    Equipment Recommendations  None recommended by PT    Recommendations for Other Services   None  Frequency Min 3X/week    Precautions / Restrictions Precautions Precautions: Fall Precaution Comments: L hemi peresis (old since stroke in April of this year) Required Braces or Orthoses: Other Brace/Splint;Sling Other Brace/Splint: L AFO, left UE sling due to fall on shoulder at home   Pertinent Vitals/Pain C/o tightness in back on left side     Mobility  Bed Mobility Bed Mobility: Not assessed Details for Bed Mobility Assistance: pt sitting edge of bed Transfers Transfers: Sit to Stand;Stand to Sit Sit to Stand: 5: Supervision;From bed Stand to Sit: 5: Supervision;To bed Details for Transfer Assistance: assist to don shoes and brace  Ambulation/Gait Ambulation/Gait Assistance: 4: Min guard;5: Supervision Ambulation Distance (Feet): 130 Feet Assistive device: Hemi-walker Ambulation/Gait Assistance Details: min steadying  assist at times due to spasticity in left and demonstrates decreased velocity with fall risk Gait Pattern: Left hip hike;Left circumduction;Lateral trunk lean to right Gait velocity: approximately 0.3 ft/sec Modified Rankin (Stroke Patients Only) Pre-Morbid Rankin Score: Moderately severe disability Modified Rankin: Moderately severe disability        PT Diagnosis: Abnormality of gait;Hemiplegia non-dominant side  PT Problem List: Decreased coordination;Decreased mobility;Decreased balance;Impaired tone;Decreased safety awareness;Decreased knowledge of use of DME PT Treatment Interventions: DME instruction;Balance training;Gait training;Neuromuscular re-education;Stair training;Functional mobility training;Patient/family education;Therapeutic activities;Therapeutic exercise     PT Goals(Current goals can be found in the care plan section) Acute Rehab PT Goals Patient Stated Goal: To improve independence PT Goal Formulation: With patient/family Time For Goal Achievement: 12/14/12 Potential to Achieve Goals: Good  Visit Information  Last PT Received On: 11/30/12 Assistance Needed: +1 History of Present Illness: 57 y/o male admitted with severe headache.  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  He is deaf and mute.       Prior Functioning  Home Living Family/patient expects to be discharged to:: Private residence Living Arrangements: Parent;Other relatives Available Help at Discharge: Family;Available 24 hours/day Type of Home: House Home Access: Ramped entrance;Stairs to enter Entrance Stairs-Number of Steps: 2 Entrance Stairs-Rails: Right Home Layout: One level Home Equipment: Wheelchair - manual;Other (comment) Psychologist, educational) Additional Comments: pt has been receiving OPPT/OT/SLP Prior Function Level of Independence: Needs assistance Gait / Transfers Assistance Needed: Has been perform SPT to w/c over past two weeks since fall ADL's / Homemaking  Assistance Needed: Min-mod assist with ADLs since fall resulting in painful LUE Communication Communication: Deaf;Interpreter utilized  Dominant Hand: Right    Cognition  Cognition Arousal/Alertness: Awake/alert Behavior During Therapy: WFL for tasks assessed/performed Overall Cognitive Status: Difficult to assess Difficult to assess due to: Hard of hearing/deaf    Extremity/Trunk Assessment Upper Extremity Assessment Upper Extremity Assessment: LUE deficits/detail LUE Deficits / Details: Did not test LUE.  Pt states that since his fall, the doctor gave him the sling to wear at all times with instructions to not move Left shoulder.  Pt says he is to go back to doctor in 1 week for f/u. LUE: Unable to fully assess due to pain;Unable to fully assess due to immobilization Cervical / Trunk Assessment Cervical / Trunk Assessment: Other exceptions Cervical / Trunk Exceptions: right lateral pelvic tilt with hypertonicity on left and steppage pattern with pelvic elevation on that side   Balance Balance Balance Assessed: Yes Static Sitting Balance Static Sitting - Balance Support: No upper extremity supported;Feet supported Static Sitting - Level of Assistance: 5: Stand by assistance Static Standing Balance Static Standing - Balance Support: Right upper extremity supported Static Standing - Level of Assistance: 4: Min assist Static Standing - Comment/# of Minutes: Assist for steadying. Pt's right knee buckling x1 while standing ~1 minute. Pt's left AFO not present in room during session.   End of Session PT - End of Session Equipment Utilized During Treatment: Gait belt Activity Tolerance: Patient tolerated treatment well Patient left: with call bell/phone within reach;in bed  GP Functional Assessment Tool Used: Clinical Judgement Functional Limitation: Mobility: Walking and moving around Mobility: Walking and Moving Around Current Status ([id number]): At least 20 percent but less than 40 percent  impaired, limited or restricted Mobility: Walking and Moving Around Goal Status [phone]): At least 20 percent but less than 40 percent impaired, limited or restricted   [facility] 11/30/2012, 1:57 PM [place], [place] [phone] 11/30/2012

## 2012-11-30 NOTE — ED Provider Notes (Signed)
Medical screening examination/treatment/procedure(s) were performed by non-physician practitioner and as supervising physician I was immediately available for consultation/collaboration.  Final plan noted.  Shelda Jakes, MD 11/30/12 [phone]

## 2012-11-30 NOTE — Progress Notes (Signed)
Speech Language Pathology Treatment: Cognitive-Linquistic  Patient Details Name: Charles [name] MRN: [id number] DOB: 01-15-1956 Today's Date: 11/30/2012 Time: 1191-4782 SLP Time Calculation (min): 14 min  Assessment / Plan / Recommendation Clinical Impression  Pt seen for further diagnostic testing with brothers present to comment of change room baseline. Pt showing improved working memory with cues but still misunderstands some complex language even with written cues. There is concern for decreased inhibition as well. Will recommend f/u with outpatient SLP if necessary to address cognition as there is some concern of new cognitive impairment and MRI is not yet available to r/o new infarct. No further acute SLP needs.    HPI     Pertinent Vitals NA  SLP Plan  Discharge SLP treatment due to (comment) (all acute needs met)    Recommendations                Follow up Recommendations: Outpatient SLP Plan: Discharge SLP treatment due to (comment) (all acute needs met)    GO    Charles Ditty, MA CCC-SLP [phone]  Charles [name] 11/30/2012, 2:02 PM

## 2012-11-30 NOTE — Evaluation (Signed)
Occupational Therapy Evaluation Patient Details Name: Charles [name] MRN: [id number] DOB: 1955-07-20 Today's Date: 11/30/2012 Time: 6295-2841 OT Time Calculation (min): 20 min  OT Assessment / Plan / Recommendation History of present illness   Pt admitted with: Headache. Patient with history of CVA, presented overnight with complaints of right-sided headache. The patient reports improvement this morning. He was seen and evaluated by neurology overnight, suspect may reflect migraine headaches. ESR was obtained and within normal limits at 2, as temporal arteritis is an unlikely cause of headaches. Pending MRI of brain without contrast as well as EEG. Will follow. Left shoulder pain patient having a fall approximately 2 weeks ago, hasn't seen orthopedic surgery. A left shoulder x-ray performed on admission did not reveal acute fracture, deformity or dislocation.  History of sarcoidosis.  Congenital deafness.     Clinical Impression   Pt admitted with above.  PLOF and home living info gathered through use of interpreter and pt's mother also present to provide information.  Pt has been requiring min-mod assist with ADLs.  Pt experienced fall ~2 weeks ago resulting in painful left shoulder. Pt states his MD instructed him to not move shoulder until next f/u appt in a week.  OT eval limited by arrival of transport to take pt to procedure.  Will continue to follow acutely in order to address below problem list. Will recommend pt continue with OPOT at d/c.    OT Assessment  Patient needs continued OT Services    Follow Up Recommendations  Supervision/Assistance - 24 hour; Outpatient OT   Barriers to Discharge      Equipment Recommendations       Recommendations for Other Services    Frequency  Min 2X/week    Precautions / Restrictions     Pertinent Vitals/Pain See vitals    ADL  Eating/Feeding: Performed;Supervision/safety;Set up Where Assessed - Eating/Feeding: Edge of bed Lower Body  Dressing: Performed;Min guard (pulled socks up while sitting EOB using RUE) Where Assessed - Lower Body Dressing: Unsupported sitting Toilet Transfer: Simulated;Minimal assistance Toilet Transfer Method: Sit to stand Toilet Transfer Equipment:  (simulated at bedside) Equipment Used: Gait belt (LUE sling) Transfers/Ambulation Related to ADLs: Min assist for sit<>stand at bedside. ADL Comments: Session limited due to arrival of transport to take pt to procedure.  Pt reports headache has resolved but he feels weak.    OT Diagnosis: Generalized weakness;Acute pain;Paresis  OT Problem List: Decreased strength;Decreased activity tolerance;Impaired balance (sitting and/or standing);Decreased knowledge of use of DME or AE;Pain;Impaired UE functional use OT Treatment Interventions: Self-care/ADL training;DME and/or AE instruction;Therapeutic activities;Balance training;Patient/family education   OT Goals(Current goals can be found in the care plan section) Acute Rehab OT Goals OT Goal Formulation: With patient/family Time For Goal Achievement: 12/14/12 Potential to Achieve Goals: Good ADL Goals Pt Will Perform Upper Body Dressing: with min assist;with caregiver independent in assisting;sitting Pt Will Transfer to Toilet: with supervision;stand pivot transfer;bedside commode Pt Will Perform Toileting - Clothing Manipulation and hygiene: with supervision;sit to/from stand  Visit Information  Last OT Received On: 11/30/12 Assistance Needed: +1       Prior Functioning     Home Living Family/patient expects to be discharged to:: Private residence Living Arrangements: Parent;Other relatives (brother) Available Help at Discharge: Family;Available 24 hours/day Type of Home: House Home Access: Ramped entrance Home Layout: One level Home Equipment: Wheelchair - manual (hemi walker, L foot AFO) Additional Comments: pt has been receiving OPPT/OT/SLP Prior Function Level of Independence: Needs  assistance Gait / Transfers  Assistance Needed: Has been perform SPT to w/c over past two weeks since fall ADL's / Homemaking Assistance Needed: Min-mod assist with ADLs since fall resulting in painful LUE Communication Communication: Deaf;Interpreter utilized Dominant Hand: Right         Vision/Perception Vision - History Baseline Vision: Wears glasses only for reading Patient Visual Report: No change from baseline   Cognition  Cognition Arousal/Alertness: Awake/alert Behavior During Therapy: WFL for tasks assessed/performed Overall Cognitive Status: Difficult to assess Difficult to assess due to: Hard of hearing/deaf    Extremity/Trunk Assessment Upper Extremity Assessment Upper Extremity Assessment: LUE deficits/detail LUE Deficits / Details: Did not test LUE.  Pt states that since his fall, the doctor gave him the sling to wear at all times with instructions to not move Left shoulder.  Pt says he is to go back to doctor in 1 week for f/u. LUE: Unable to fully assess due to pain;Unable to fully assess due to immobilization     Mobility Bed Mobility Bed Mobility: Not assessed (pt sitting EOB) Transfers Transfers: Sit to Stand;Stand to Sit Sit to Stand: 4: Min assist;From bed Stand to Sit: 4: Min assist;To bed Details for Transfer Assistance: Assist to RUE for HHA and steadying.      Exercise     Balance Balance Balance Assessed: Yes Static Sitting Balance Static Sitting - Balance Support: No upper extremity supported;Feet supported Static Sitting - Level of Assistance: 6: Modified independent (Device/Increase time) Static Standing Balance Static Standing - Balance Support: Right upper extremity supported Static Standing - Level of Assistance: 4: Min assist Static Standing - Comment/# of Minutes: Assist for steadying. Pt's right knee buckling x1 while standing ~1 minute. Pt's left AFO not present in room during session.    End of Session OT - End of  Session Equipment Utilized During Treatment: Gait belt Activity Tolerance: Patient tolerated treatment well Patient left: in bed;with call bell/phone within reach;with family/visitor present Nurse Communication: Mobility status  GO Functional Assessment Tool Used: clinical judgement Functional Limitation: Self care Self Care Current Status (W0981): At least 20 percent but less than 40 percent impaired, limited or restricted Self Care Goal Status (X9147): At least 1 percent but less than 20 percent impaired, limited or restricted  11/30/2012 Cipriano Mile OTR/L Pager [phone] Office [phone]  Cipriano Mile 11/30/2012, 1:04 PM

## 2012-12-01 ENCOUNTER — Observation Stay (HOSPITAL_COMMUNITY): Payer: Medicare Other

## 2012-12-01 DIAGNOSIS — D869 Sarcoidosis, unspecified: Secondary | ICD-10-CM

## 2012-12-01 DIAGNOSIS — G8110 Spastic hemiplegia affecting unspecified side: Secondary | ICD-10-CM

## 2012-12-01 DIAGNOSIS — I6350 Cerebral infarction due to unspecified occlusion or stenosis of unspecified cerebral artery: Secondary | ICD-10-CM

## 2012-12-01 DIAGNOSIS — G459 Transient cerebral ischemic attack, unspecified: Secondary | ICD-10-CM

## 2012-12-01 LAB — GLUCOSE, CAPILLARY: Glucose-Capillary: 139 mg/dL — ABNORMAL HIGH (ref 70–99)

## 2012-12-01 NOTE — Discharge Summary (Signed)
Physician Discharge Summary  [name]:[id number] DOB: [birth year] DOA: 11/29/2012  PCP: Kirstie Peri, MD  Admit date: 11/29/2012 Discharge date: 12/01/2012  Time spent: 35 minutes  Recommendations for Outpatient Follow-up:  1. Please followup on headache diary  Discharge Diagnoses:  Principal Problem:   Headache Active Problems:   CVA (cerebral vascular accident)   Type II or unspecified type diabetes mellitus without mention of complication, uncontrolled   Sarcoidosis   Deaf mutism, congenital   Essential hypertension, benign   Spastic hemiplegia affecting nondominant side   Discharge Condition: Stable/improved  Diet recommendation: Heart healthy  Filed Weights   11/30/12 0358  Weight: 74.9 kg (165 lb 2 oz)    History of present illness:   Charles [name] is a 57 y.o. 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 Patient was hospitalized in August of 2014 after he was transferred from [facility] for evaluation of left-sided weakness and numbness in left arm. Patient has had extensive workup during the course of the hospital stay including an MRI of the brain, EEG, 2-D echocardiogram and TEE, and carotid Dopplers. After extensive workup neurology recommended optimal medical management. Patient has been seen by Dr. Hosie Poisson with [facility] neurology for consideration Botox therapy for spastic hemiplegia secondary to CVA on 11/08/2012.  Patient developed right-sided severe headache at 3 p.m. on 11/29/2012. He was clutching the right side of his head. With this he had blurred vision in his right eye. He presented to the [facility] department for further evaluation. Head CT obtained in the emergency department showed no acute intracranial abnormality. A lumbar puncture was attempted under fluoroscopy, due to his left  shoulder pain he cannot be positioned properly as a result a lumbar puncture was not done. He also received a cocktail for his headache, since then his headache and right eye blurry vision has resolved. Most of the history was obtained by patient's mother and brother a bedside. Unfortunately interpreter was not available. Hospitalist service was asked to admit the patient for further care and management. He has not had any recent fevers, chills, nausea, vomiting, chest pain, shortness of breath, abdominal pain, or diarrhea.  Hospital Course:  Patient with a past medical history of right MCA infarct in April of 2014, with left residual hemiparesis, presented to the emergent apartment on 11/30/2012 with complaints of severe right-sided headache. Patient had been having sporadic headaches since his CVA in April. It appears that they have presented in a similar fashion, located in the right, lasting no more than several hours. Patient had a workup back in August which is unremarkable. He has been on Plavix and aspirin therapy. Initial lab work was unremarkable, neurology consultation was requested. Patient was seen and evaluated by Dr. Thad Ranger and by Dr. Leroy Kennedy. ESR was within normal limits, as it was felt this was unlikely to represent temporal arteritis. An EEG performed on 11/30/2012 showed normal weight and asleep EEG, as it would appear that seizures are unlikely. This was followed up with an MRI of brain which was performed on 12/01/2012. Study showed no acute infarct, remote large right hemispheric infarct with encephalomalacia and Wallerian degeneration, findings noted previously. I went over these results with Dr. Leroy Kennedy of neurology. Headaches have been sporadic in nature as prophylactic headache treatment was not recommended, in consideration of migraine headaches. Dr. Leroy Kennedy advised patient to keep a headache calendar for the  next 4 weeks and discuss this further with his outpatient neurologist. Patient  otherwise has remained stable during this hospitalization without further headaches or worsening focal neurological deficits. He has been nontoxic, hemodynamically stable, and tolerating by mouth intake. He was discharged in stable condition on 12/01/2012.  Procedures:  EEG performed on 11/30/2012, impression: Normal EEG  Consultations:  Neurology  Discharge Exam: Filed Vitals:   12/01/12 0950  BP: 141/88  Pulse: 73  Temp: 98.7 F (37.1 C)  Resp: 18    General: Patient is in no acute distress, he is sitting up, having his breakfast, denies headaches Cardiovascular: Regular rate and rhythm normal S1-S2 Respiratory: Lungs are clear to auscultation bilaterally no wheezing rhonchi Abdomen: Soft nontender nondistended  Discharge Instructions  Discharge Orders   Future Appointments Provider Department Dept Phone   12/10/2012 11:00 AM Omelia Blackwater, [place] [facility] [phone]   12/31/2012 3:00 PM [facility] [phone]   12/31/2012 4:00 PM Nyoka Cowden, MD [facility] [phone]   Future Orders Complete By Expires   Call MD for:  difficulty breathing, headache or visual disturbances  As directed    Call MD for:  extreme fatigue  As directed    Call MD for:  persistant dizziness or light-headedness  As directed    Call MD for:  persistant nausea and vomiting  As directed    Call MD for:  severe uncontrolled pain  As directed    Call MD for:  temperature >100.4  As directed    Diet - low sodium heart healthy  As directed    Discharge instructions  As directed    Comments:     Please keep a headache diary for the next 4 weeks, and present this to your neurologist on your next follow up appointment.  Keep follow up appointment with Neurology in the next 1-2 weeks   Increase activity slowly  As directed        Medication List         acetaminophen 500 MG tablet  Commonly known as:  TYLENOL  Take 1,000  mg by mouth every 8 (eight) hours as needed for pain. Per bottle as needed for pain     aspirin 325 MG EC tablet  Take 325 mg by mouth daily.     atorvastatin 10 MG tablet  Commonly known as:  LIPITOR  Take 10 mg by mouth daily. (cholesterol)     baclofen 10 MG tablet  Commonly known as:  LIORESAL  Take 10 mg by mouth 2 (two) times daily. (muscle spasm)     clopidogrel 75 MG tablet  Commonly known as:  PLAVIX  Take 75 mg by mouth every morning. (stroke prevention)     famotidine 20 MG tablet  Commonly known as:  PEPCID  Take 20 mg by mouth at bedtime.     Fluticasone Furoate-Vilanterol 100-25 MCG/INH Aepb  Commonly known as:  BREO ELLIPTA  Inhale 1 puff into the lungs every morning.     losartan 50 MG tablet  Commonly known as:  COZAAR  Take 1 tablet (50 mg total) by mouth daily.     metFORMIN 500 MG tablet  Commonly known as:  GLUCOPHAGE  Take 1 tablet (500 mg total) by mouth 2 (two) times daily with a meal.     pantoprazole 40 MG tablet  Commonly known as:  PROTONIX  Take 40 mg by mouth 2 (two) times daily before a meal. (reflux)  polyethylene glycol powder powder  Commonly known as:  GLYCOLAX/MIRALAX  Take 17 g by mouth at bedtime.     predniSONE 10 MG tablet  Commonly known as:  DELTASONE  Take 10 mg by mouth every morning. (sarcoid)       Allergies  Allergen Reactions  . Contrast Media [Iodinated Diagnostic Agents] Itching  . Shellfish Allergy Hives and Swelling       Follow-up Information   Follow up with [facility], MD In 1 week.   Specialty:  Internal Medicine   Contact information:   [address]  [place] [address] [phone]        The results of significant diagnostics from this hospitalization (including imaging, microbiology, ancillary and laboratory) are listed below for reference.    Significant Diagnostic Studies: Dg Chest 1 View  11/30/2012   *RADIOLOGY REPORT*  Clinical Data: Stroke, pain involving the right shoulder,  history of sarcoidosis  CHEST - 1 VIEW  Comparison: 11/19/2012; 10/02/2012  Findings:  Grossly unchanged cardiac silhouette and mediastinal contours. Grossly unchanged bilateral partially calcified hilar and mediastinal lymph nodes with associated apical predominant nodular interstitial thickening and volume loss, findings compatible with provided history of sarcoidosis.  Given extensive background parenchymal abnormalities, there are no discrete new focal airspace opacities.  The approximately 1.0 x 0.8 cm nodule overlying the peripheral aspect the right upper lung appears grossly unchanged. No pleural effusion or pneumothorax.  No evidence of edema. Grossly unchanged bones with special attention paid to the right shoulder.  IMPRESSION: 1.  Grossly stable sequela of provided history of pulmonary sarcoidosis without definite superimposed acute cardiopulmonary disease. 2.  Grossly unchanged approximately 1 cm nodule overlying the peripheral aspect of the right upper lung.  Again, further evaluation may be performed with chest CT as clinically indicated.   Original Report Authenticated By: Tacey Ruiz, MD   Dg Chest 1 View  11/19/2012   CLINICAL DATA:  Sarcoidosis, history asthma, hypertension, diabetes  EXAM: CHEST - 1 VIEW  COMPARISON:  A 01/2013  FINDINGS: Upper normal heart size.  Again identified hilar and mediastinal calcified adenopathy.  Parenchymal lung scarring at the upper lobes and bases.  Findings remain consistent with sarcoidosis.  No acute infiltrate, pleural effusion or pneumothorax.  New area of questionable nodularity identified at the lateral right upper lobe, 14 x 11 mm.  No acute osseous findings.  IMPRESSION: Parenchymal scarring and calcified thoracic adenopathy consistent with history of sarcoidosis.  Question new 14 x 11 mm right upper lobe nodular density, not visualized on the previous exam.  CT chest with contrast recommended for further evaluation.   Electronically Signed   By: Ulyses Southward M.D.   On: 11/19/2012 14:30   Ct Head Wo Contrast  11/29/2012   CLINICAL DATA:  Right-sided headache onset 2 hr ago. Prior stroke. Left side deficits.  EXAM: CT HEAD WITHOUT CONTRAST  TECHNIQUE: Contiguous axial images were obtained from the base of the skull through the vertex without intravenous contrast.  COMPARISON:  MRI 10/02/2012.  FINDINGS: Left MCA distribution encephalomalacia is present. Atrophy and chronic ischemic white matter disease is also present. Compared to the recent MRI, there is no interval change. No acute intracranial abnormality. No mass lesion, midline shift or hydrocephalus. The calvarium appears intact. Mastoid air cells appear clear. There is an old right medial orbital wall blowout fracture.  IMPRESSION: No acute intracranial abnormality. Atrophy, chronic ischemic white matter disease and old right MCA distribution infarct appears similar to the prior study.  Electronically Signed   By: Andreas Newport M.D.   On: 11/29/2012 19:43   Mr Brain Wo Contrast  12/01/2012   CLINICAL DATA:  History of prior infarct presenting with head pain and blurred vision right eye. Diabetic hypertensive patient with history of sarcoid.  EXAM: MRI HEAD WITHOUT CONTRAST  TECHNIQUE: Multiplanar, multisequence MR imaging was performed. No intravenous contrast was administered.  COMPARISON:  11/29/2012 CT. 10/02/2012 MR.  FINDINGS: Exam is motion degraded.  No acute infarct.  Remote large right hemispheric infarct with encephalomalacia and Wallerian degeneration. Findings noted previously.  Atrophy. Ventricular prominence felt to be related to atrophy and prior infarct.  Prominent white matter type changes once again noted which may reflect result of small vessel disease in this hypertensive diabetic patient. White matter type changes secondary to sarcoidosis cannot be excluded as contributing to these findings.  No intracranial hemorrhage.  No intracranial mass lesion noted on this unenhanced exam.   Abnormal appearance of the vertebral arteries, basilar artery and right middle cerebral artery. Please see recent MR angiogram report (10/02/2012).  Moderate mucosal thickening right maxillary sinus.  Spinal stenosis most prominent C3-4 and C4-5.  IMPRESSION: Exam is motion degraded.  No acute infarct.  Remote large right hemispheric infarct with encephalomalacia and Wallerian degeneration. Findings noted previously.  Atrophy. Ventricular prominence felt to be related to atrophy and prior infarct.  Prominent white matter type changes once again noted which may reflect result of small vessel disease in this hypertensive diabetic patient. White matter type changes secondary to sarcoidosis cannot be excluded as contributing to these findings.  Abnormal appearance of the vertebral arteries, basilar artery and right middle cerebral artery. Please see recent MR angiogram report (10/02/2012).  Moderate mucosal thickening right maxillary sinus.  Spinal stenosis most prominent C3-4 and C4-5.   Electronically Signed   By: Bridgett Larsson M.D.   On: 12/01/2012 09:23   Dg Shoulder Left  11/30/2012   *RADIOLOGY REPORT*  Clinical Data: Pain after fall.  LEFT SHOULDER - 2+ VIEW  Comparison: None available at time of study interpretation.  Findings: The humeral head is well-formed and located.  The subacromial, glenohumeral and acromioclavicular joint spaces are intact.  No destructive bony lesions.  Soft tissue planes are non- suspicious.  Included view of the chest demonstrates calcified left hilar calcified lymph nodes.  IMPRESSION: No acute fracture deformity nor dislocation.   Original Report Authenticated By: Awilda Metro    Microbiology: No results found for this or any previous visit (from the past 240 hour(s)).   Labs: Basic Metabolic Panel:  Recent Labs Lab 11/30/12 0029 11/30/12 0545  NA 141 139  K 3.8 3.9  CL 103 102  CO2  --  27  GLUCOSE 166* 163*  BUN 8 9  CREATININE 1.30 0.91  CALCIUM  --   8.9   Liver Function Tests: No results found for this basename: AST, ALT, ALKPHOS, BILITOT, PROT, ALBUMIN,  in the last 168 hours No results found for this basename: LIPASE, AMYLASE,  in the last 168 hours No results found for this basename: AMMONIA,  in the last 168 hours CBC:  Recent Labs Lab 11/30/12 0020 11/30/12 0029 11/30/12 0545  WBC 7.2  --  8.3  NEUTROABS 6.3  --   --   HGB 13.2 13.6 13.1  HCT 36.8* 40.0 36.2*  MCV 86.0  --  85.4  PLT 120*  --  126*   Cardiac Enzymes:  Recent Labs Lab 11/30/12 0044  TROPONINI <0.30  BNP: BNP (last 3 results) No results found for this basename: PROBNP,  in the last 8760 hours CBG:  Recent Labs Lab 11/30/12 1216 11/30/12 1617 11/30/12 2216 12/01/12 0635 12/01/12 1113  GLUCAP 181* 182* 220* 139* 119*       Signed:  Eveleigh Crumpler  [facility] 12/01/2012, 12:08 PM

## 2012-12-01 NOTE — Progress Notes (Signed)
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 Mother and bother are at the bedside. Stated that the HA is gone. No new neurological complains. No neck pain, head trauma, or recent fever. They said that this this the second or third time he had a HA since his stroke.  EEG normal and brain MRI showed no acute or chronic lesions to explain his HA. On further questioning, he said that his HA had not been associated with nausea, vomiting, visual disturbances, confusion, right sided weakness or worsening left side weakness. It won't last more than 4 hours, no worsened by movements.  OBJECTIVE:                                                                                                                           Vital signs in last 24 hours: Temp:  [97.9 F (36.6 C)-98.7 F (37.1 C)] 98.7 F (37.1 C) (10/11 0950) Pulse Rate:  [68-84] 73 (10/11 0950) Resp:  [16-18] 18 (10/11 0950) BP: (125-168)/(70-112) 141/88 mmHg (10/11 0950) SpO2:  [97 %-100 %] 97 % (10/11 0950)  Intake/Output from previous day: 10/10 0701 - 10/11 0700 In: -  Out: 550 [Urine:550] Intake/Output this shift:   Nutritional status: Cardiac  Past Medical History  Diagnosis Date  . Diabetes   . Hypertension   . Sarcoid   . Stroke   . Arthritis   . Shortness of breath   . Asthma   . Deaf     Neurologic Exam:  Mental Status:  Alert. Speaks minimally but is able to read sign language. Able to follow simple commands even without the interpreter.  Cranial Nerves:  II: Discs flat bilaterally; Visual fields grossly normal, pupils equal, round, reactive to light and accommodation  III,IV, VI: ptosis not present, extra-ocular motions intact bilaterally  V,VII: left facial droop,  facial light touch sensation decreased on the left  VIII: deaf  IX,X: gag reflex present  XI: shoulder shrug decreased on the left  XII: midline tongue extension  Motor:  Right : Upper extremity 5/5 Left: Upper extremity 5/5  Lower extremity 5/5 Lower extremity /5  Tone and bulk:normal tone throughout; no atrophy noted  Sensory: Pinprick and light touch decreased on the left  Deep Tendon Reflexes: 2+ in the upper extremities, 1+ at the knees  Plantars:  Right: upgoing Left: upgoing  Cerebellar:  normal finger-to-nose, normal rapid alternating movements and normal heel-to-shin test  Gait: no tested. CV: pulses palpable throughout    Lab Results: Lab Results  Component Value Date/Time   CHOL 164 11/30/2012  5:45 AM   Lipid Panel  Recent Labs  11/30/12 0545  CHOL 164  TRIG 42  HDL 79  CHOLHDL 2.1  VLDL 8  LDLCALC 77    Studies/Results: Dg Chest 1 View  11/30/2012   *RADIOLOGY REPORT*  Clinical Data: Stroke, pain involving the right shoulder, history of sarcoidosis  CHEST - 1 VIEW  Comparison: 11/19/2012; 10/02/2012  Findings:  Grossly unchanged cardiac silhouette and mediastinal contours. Grossly unchanged bilateral partially calcified hilar and mediastinal lymph nodes with associated apical predominant nodular interstitial thickening and volume loss, findings compatible with provided history of sarcoidosis.  Given extensive background parenchymal abnormalities, there are no discrete new focal airspace opacities.  The approximately 1.0 x 0.8 cm nodule overlying the peripheral aspect the right upper lung appears grossly unchanged. No pleural effusion or pneumothorax.  No evidence of edema. Grossly unchanged bones with special attention paid to the right shoulder.  IMPRESSION: 1.  Grossly stable sequela of provided history of pulmonary sarcoidosis without definite superimposed acute cardiopulmonary disease. 2.  Grossly unchanged approximately 1 cm nodule overlying the peripheral  aspect of the right upper lung.  Again, further evaluation may be performed with chest CT as clinically indicated.   Original Report Authenticated By: Tacey Ruiz, MD   Ct Head Wo Contrast  11/29/2012   CLINICAL DATA:  Right-sided headache onset 2 hr ago. Prior stroke. Left side deficits.  EXAM: CT HEAD WITHOUT CONTRAST  TECHNIQUE: Contiguous axial images were obtained from the base of the skull through the vertex without intravenous contrast.  COMPARISON:  MRI 10/02/2012.  FINDINGS: Left MCA distribution encephalomalacia is present. Atrophy and chronic ischemic white matter disease is also present. Compared to the recent MRI, there is no interval change. No acute intracranial abnormality. No mass lesion, midline shift or hydrocephalus. The calvarium appears intact. Mastoid air cells appear clear. There is an old right medial orbital wall blowout fracture.  IMPRESSION: No acute intracranial abnormality. Atrophy, chronic ischemic white matter disease and old right MCA distribution infarct appears similar to the prior study.   Electronically Signed   By: Andreas Newport M.D.   On: 11/29/2012 19:43   Mr Brain Wo Contrast  12/01/2012   CLINICAL DATA:  History of prior infarct presenting with head pain and blurred vision right eye. Diabetic hypertensive patient with history of sarcoid.  EXAM: MRI HEAD WITHOUT CONTRAST  TECHNIQUE: Multiplanar, multisequence MR imaging was performed. No intravenous contrast was administered.  COMPARISON:  11/29/2012 CT. 10/02/2012 MR.  FINDINGS: Exam is motion degraded.  No acute infarct.  Remote large right hemispheric infarct with encephalomalacia and Wallerian degeneration. Findings noted previously.  Atrophy. Ventricular prominence felt to be related to atrophy and prior infarct.  Prominent white matter type changes once again noted which may reflect result of small vessel disease in this hypertensive diabetic patient. White matter type changes secondary to sarcoidosis cannot  be excluded as contributing to  these findings.  No intracranial hemorrhage.  No intracranial mass lesion noted on this unenhanced exam.  Abnormal appearance of the vertebral arteries, basilar artery and right middle cerebral artery. Please see recent MR angiogram report (10/02/2012).  Moderate mucosal thickening right maxillary sinus.  Spinal stenosis most prominent C3-4 and C4-5.  IMPRESSION: Exam is motion degraded.  No acute infarct.  Remote large right hemispheric infarct with encephalomalacia and Wallerian degeneration. Findings noted previously.  Atrophy. Ventricular prominence felt to be related to atrophy and prior infarct.  Prominent white matter type changes once again noted which may reflect result of small vessel disease in this hypertensive diabetic patient. White matter type changes secondary to sarcoidosis cannot be excluded as contributing to these findings.  Abnormal appearance of the vertebral arteries, basilar artery and right middle cerebral artery. Please see recent MR angiogram report (10/02/2012).  Moderate mucosal thickening right maxillary sinus.  Spinal stenosis most prominent C3-4 and C4-5.   Electronically Signed   By: Bridgett Larsson M.D.   On: 12/01/2012 09:23   Dg Shoulder Left  11/30/2012   *RADIOLOGY REPORT*  Clinical Data: Pain after fall.  LEFT SHOULDER - 2+ VIEW  Comparison: None available at time of study interpretation.  Findings: The humeral head is well-formed and located.  The subacromial, glenohumeral and acromioclavicular joint spaces are intact.  No destructive bony lesions.  Soft tissue planes are non- suspicious.  Included view of the chest demonstrates calcified left hilar calcified lymph nodes.  IMPRESSION: No acute fracture deformity nor dislocation.   Original Report Authenticated By: Awilda Metro    MEDICATIONS                                                                                                                       I have reviewed the patient's  current medications.  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 He is currently asymptomatic from a neurological standpoint and has no new lateralizing findings on neuro-exam. In addition, MRI-DWI showed no lesions to explain his HA and EEG is normal. I had a long conversation with patient and family regarding potential etiologies for the HA and conveyed to them my impression that this is not a HA caused by structural brain disease and  further work up should be done as outpatient. Advised to keep a HA calendar for the next 4 weeks and discussed it with his outpatient neurologist ( they already have an appointment arranged in 1.5 weeks) to determine whether ort not preventive HA treatment is warranted, but at this time his HA are very sporadic and I am not in favor of prophylactic HA treatment. They agreed to pursue that pathway. Of course, they were asked to come to the ED in case of a significant change in the HA pattern.    Wyatt Portela, MD [facility] [phone]  12/01/2012, 11:02 AM

## 2012-12-01 NOTE — Progress Notes (Signed)
Occupational Therapy Treatment Patient Details Name: Charles [name] MRN: [id number] DOB: [birth year] Today's Date: 12/01/2012 Time: 4098-1191 OT Time Calculation (min): 28 min  OT Assessment / Plan / Recommendation  History of present illness 57 y/o male admitted with severe headache.  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  He is deaf and mute.   OT comments  Completed all education with family. Pt to continue with outpt OT  Follow Up Recommendations  Supervision/Assistance - 24 hour;Outpatient OT    Barriers to Discharge       Equipment Recommendations  Other (comment) (L resting hand splint)    Recommendations for Other Services    Frequency Min 2X/week   Progress towards OT Goals Progress towards OT goals: Progressing toward goals  Plan Discharge plan remains appropriate    Precautions / Restrictions Precautions Precautions: Fall Precaution Comments: L hemi peresis (old since stroke in April of this year) Required Braces or Orthoses: Other Brace/Splint;Sling Other Brace/Splint: L AFO, left UE sling due to fall on shoulder at home   Pertinent Vitals/Pain no apparent distress     ADL  ADL Comments: Reviewed use of sling with family and how to donn/doff and proper positioning of sling. Discussed recommendations for resting hand splint for R hand and to follow up with botox for RUE. Information given for family to relay to outpt OT.    OT Diagnosis:    OT Problem List:   OT Treatment Interventions:     OT Goals(current goals can now be found in the care plan section) Acute Rehab OT Goals Patient Stated Goal: To improve independence OT Goal Formulation: With patient/family Time For Goal Achievement: 12/14/12 Potential to Achieve Goals: Good ADL Goals Pt Will Perform Upper Body Dressing: with min assist;with caregiver independent in assisting;sitting Pt Will Transfer to Toilet: with supervision;stand pivot transfer;bedside commode Pt Will  Perform Toileting - Clothing Manipulation and hygiene: with supervision;sit to/from stand Additional ADL Goal #1: Pt will demonstrate a core strengthening exercise HEP MOD I Additional ADL Goal #2: Pt will demonstrate sink level adl min guard without dizziness  Visit Information  Last OT Received On: 12/01/12 Assistance Needed: +1 History of Present Illness: 57 y/o male admitted with severe headache.  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  He is deaf and mute.    Subjective Data      Prior Functioning       Cognition  Cognition Arousal/Alertness: Awake/alert Behavior During Therapy: WFL for tasks assessed/performed Overall Cognitive Status: Within Functional Limits for tasks assessed Difficult to assess due to: Hard of hearing/deaf    Mobility  Bed Mobility Supine to Sit: 6: Modified independent (Device/Increase time) Transfers Sit to Stand: 5: Supervision;From bed Stand to Sit: 5: Supervision;To bed    Exercises  Other Exercises Other Exercises: Explained use of sling for support of LUE. Encouraged pt to perform gentle assisted pendulums with LUE in seated position with elbow bent within pain tolerance.    Balance     End of Session OT - End of Session Activity Tolerance: Patient tolerated treatment well Patient left: in bed;with call bell/phone within reach;with family/visitor present Nurse Communication: Mobility status;Other (comment) (ready for D/C)  GO Functional Assessment Tool Used: clinical judgement Functional Limitation: Self care Self Care Goal Status (Y7829): At least 1 percent but less than 20 percent impaired, limited or restricted Self Care Discharge Status [phone]): At least 1 percent but less than 20 percent impaired, limited or  restricted   Charles [name],HILLARY 12/01/2012, 3:13 PM [facility], OTR/L  [phone] 12/01/2012

## 2012-12-01 NOTE — Progress Notes (Signed)
Physical Therapy Treatment Patient Details Name: LA DIBELLA MRN: [id number] DOB: 07/06/1955 Today's Date: 12/01/2012 Time: 3086-5784 PT Time Calculation (min): 25 min  PT Assessment / Plan / Recommendation  History of Present Illness 57 y/o male admitted with severe headache.  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  He is deaf and mute.   PT Comments   Patient progressing well this session. ABle to increase ambulation with no LOB and tolerate stair training as well. Family present in room during session. Patient very motivated. Interpreter used throughout and extremely helpful  Follow Up Recommendations  Outpatient PT     Does the patient have the potential to tolerate intense rehabilitation     Barriers to Discharge        Equipment Recommendations  None recommended by PT    Recommendations for Other Services    Frequency Min 3X/week   Progress towards PT Goals Progress towards PT goals: Progressing toward goals  Plan Current plan remains appropriate    Precautions / Restrictions Precautions Precautions: Fall Precaution Comments: L hemi peresis (old since stroke in April of this year) Required Braces or Orthoses: Other Brace/Splint;Sling Other Brace/Splint: L AFO, left UE sling due to fall on shoulder at home   Pertinent Vitals/Pain Denied pain    Mobility  Bed Mobility Supine to Sit: 6: Modified independent (Device/Increase time) Transfers Sit to Stand: 5: Supervision;From bed Stand to Sit: 5: Supervision;To bed Ambulation/Gait Ambulation/Gait Assistance: 5: Supervision Ambulation Distance (Feet): 200 Feet Assistive device: Hemi-walker Ambulation/Gait Assistance Details: Patient appears more stable today. No LOB noted this session Gait Pattern: Left hip hike;Left circumduction;Lateral trunk lean to right Stairs: Yes Stairs Assistance: 4: Min guard Stair Management Technique: One rail Right;Step to pattern;Forwards Number of Stairs: 3     Exercises     PT Diagnosis:    PT Problem List:   PT Treatment Interventions:     PT Goals (current goals can now be found in the care plan section)    Visit Information  Last PT Received On: 12/01/12 Assistance Needed: +1 History of Present Illness: 57 y/o male admitted with severe headache.  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  He is deaf and mute.    Subjective Data      Cognition  Cognition Arousal/Alertness: Awake/alert Behavior During Therapy: WFL for tasks assessed/performed Overall Cognitive Status: Within Functional Limits for tasks assessed Difficult to assess due to: Hard of hearing/deaf    Balance     End of Session PT - End of Session Equipment Utilized During Treatment: Gait belt Activity Tolerance: Patient tolerated treatment well Patient left: with call bell/phone within reach;in bed   GP     Fredrich Birks 12/01/2012, 11:42 AM  12/01/2012 Fredrich Birks PTA [phone] pager [phone] office

## 2012-12-01 NOTE — Progress Notes (Signed)
Pt given discharge instructions.  PIV removed.  Pt denied any complaints or questions. Pt taken down to drop off location via wheelchair.

## 2012-12-04 ENCOUNTER — Ambulatory Visit: Payer: Self-pay | Admitting: Nurse Practitioner

## 2012-12-10 ENCOUNTER — Ambulatory Visit (INDEPENDENT_AMBULATORY_CARE_PROVIDER_SITE_OTHER): Payer: Medicare Other | Admitting: Neurology

## 2012-12-10 ENCOUNTER — Encounter: Payer: Self-pay | Admitting: Neurology

## 2012-12-10 VITALS — BP 144/84 | HR 88 | Ht 65.0 in | Wt 167.0 lb

## 2012-12-10 DIAGNOSIS — R51 Headache: Secondary | ICD-10-CM

## 2012-12-10 DIAGNOSIS — G8110 Spastic hemiplegia affecting unspecified side: Secondary | ICD-10-CM

## 2012-12-10 DIAGNOSIS — M5416 Radiculopathy, lumbar region: Secondary | ICD-10-CM

## 2012-12-10 MED ORDER — ONABOTULINUMTOXINA 100 UNITS IJ SOLR: 300.0000 [IU] | Freq: Once | INTRAMUSCULAR | Status: DC
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
## 2012-12-24 ENCOUNTER — Ambulatory Visit
Admission: RE | Admit: 2012-12-24 | Discharge: 2012-12-24 | Disposition: A | Discharge status: Home / Self Care | Payer: Medicare Other | Source: Ambulatory Visit | Attending: Neurology | Admitting: Neurology

## 2012-12-24 DIAGNOSIS — M5416 Radiculopathy, lumbar region: Secondary | ICD-10-CM

## 2012-12-24 DIAGNOSIS — G8110 Spastic hemiplegia affecting unspecified side: Secondary | ICD-10-CM

## 2012-12-27 ENCOUNTER — Telehealth: Payer: Self-pay | Admitting: Neurology

## 2012-12-27 DIAGNOSIS — M545 Low back pain: Secondary | ICD-10-CM

## 2012-12-27 NOTE — Telephone Encounter (Signed)
Called patients mother to discuss MRI L spine findings. He continues to suffer from severe lower back pain. Will place referral for evaluation with ortho surgery.

## 2012-12-31 ENCOUNTER — Ambulatory Visit: Payer: Medicare Other | Admitting: Internal Medicine

## 2013-02-18 ENCOUNTER — Telehealth: Payer: Self-pay | Admitting: *Deleted

## 2013-02-18 NOTE — Telephone Encounter (Signed)
LMTCB

## 2013-02-18 NOTE — Telephone Encounter (Signed)
Message copied by Christen Butter on Mon Feb 18, 2013  3:40 PM ------      Message from: Sandrea Hughs B      Created: Mon Nov 19, 2012  5:18 PM       Needs fu cxr by now ------

## 2013-02-19 ENCOUNTER — Telehealth: Payer: Self-pay | Admitting: Neurology

## 2013-02-19 ENCOUNTER — Other Ambulatory Visit: Payer: Self-pay | Admitting: Neurology

## 2013-02-19 DIAGNOSIS — G8110 Spastic hemiplegia affecting unspecified side: Secondary | ICD-10-CM

## 2013-02-19 NOTE — Telephone Encounter (Signed)
Patient called. OT referral placed.

## 2013-02-19 NOTE — Telephone Encounter (Signed)
Patient's mother calling to discuss physical therapy options for patient. Please call the patient's mother back.

## 2013-02-19 NOTE — Telephone Encounter (Signed)
Appt was scheduled for 02/28/13 Nothing further needed

## 2013-02-22 ENCOUNTER — Emergency Department (HOSPITAL_COMMUNITY): Payer: Medicare Other

## 2013-02-22 ENCOUNTER — Emergency Department (HOSPITAL_COMMUNITY)
Admission: EM | Admit: 2013-02-22 | Discharge: 2013-02-22 | Disposition: A | Discharge status: Home / Self Care | Payer: Medicare Other | Attending: Emergency Medicine | Admitting: Emergency Medicine

## 2013-02-22 ENCOUNTER — Encounter (HOSPITAL_COMMUNITY): Payer: Self-pay | Admitting: Emergency Medicine

## 2013-02-22 DIAGNOSIS — E119 Type 2 diabetes mellitus without complications: Secondary | ICD-10-CM | POA: Insufficient documentation

## 2013-02-22 DIAGNOSIS — Z7982 Long term (current) use of aspirin: Secondary | ICD-10-CM | POA: Insufficient documentation

## 2013-02-22 DIAGNOSIS — Z87891 Personal history of nicotine dependence: Secondary | ICD-10-CM | POA: Insufficient documentation

## 2013-02-22 DIAGNOSIS — Z8673 Personal history of transient ischemic attack (TIA), and cerebral infarction without residual deficits: Secondary | ICD-10-CM | POA: Insufficient documentation

## 2013-02-22 DIAGNOSIS — IMO0002 Reserved for concepts with insufficient information to code with codable children: Secondary | ICD-10-CM | POA: Insufficient documentation

## 2013-02-22 DIAGNOSIS — Z79899 Other long term (current) drug therapy: Secondary | ICD-10-CM | POA: Insufficient documentation

## 2013-02-22 DIAGNOSIS — Z7902 Long term (current) use of antithrombotics/antiplatelets: Secondary | ICD-10-CM | POA: Insufficient documentation

## 2013-02-22 DIAGNOSIS — R0602 Shortness of breath: Secondary | ICD-10-CM

## 2013-02-22 DIAGNOSIS — I10 Essential (primary) hypertension: Secondary | ICD-10-CM | POA: Insufficient documentation

## 2013-02-22 DIAGNOSIS — Z8739 Personal history of other diseases of the musculoskeletal system and connective tissue: Secondary | ICD-10-CM | POA: Insufficient documentation

## 2013-02-22 DIAGNOSIS — J3489 Other specified disorders of nose and nasal sinuses: Secondary | ICD-10-CM | POA: Insufficient documentation

## 2013-02-22 DIAGNOSIS — J45901 Unspecified asthma with (acute) exacerbation: Secondary | ICD-10-CM

## 2013-02-22 MED ORDER — BENZONATATE 100 MG PO CAPS: 100.0000 mg | ORAL_CAPSULE | Freq: Three times a day (TID) | ORAL | Status: DC

## 2013-02-22 MED ORDER — ALBUTEROL SULFATE HFA 108 (90 BASE) MCG/ACT IN AERS: 2.0000 | INHALATION_SPRAY | RESPIRATORY_TRACT | Status: DC | PRN

## 2013-02-22 MED ORDER — ALBUTEROL SULFATE (2.5 MG/3ML) 0.083% IN NEBU
5.0000 mg | INHALATION_SOLUTION | Freq: Once | RESPIRATORY_TRACT | Status: AC
  Administered 2013-02-22: 5 mg via RESPIRATORY_TRACT
  Filled 2013-02-22: qty 6

## 2013-02-22 MED ORDER — NAPROXEN 250 MG PO TABS
500.0000 mg | ORAL_TABLET | Freq: Once | ORAL | Status: AC
  Administered 2013-02-22: 500 mg via ORAL
  Filled 2013-02-22: qty 2

## 2013-02-22 MED ORDER — PREDNISONE 20 MG PO TABS
40.0000 mg | ORAL_TABLET | Freq: Once | ORAL | Status: AC
  Administered 2013-02-22: 40 mg via ORAL
  Filled 2013-02-22: qty 2

## 2013-02-22 MED ORDER — PREDNISONE 20 MG PO TABS: 40.0000 mg | ORAL_TABLET | Freq: Every day | ORAL | Status: DC

## 2013-02-22 NOTE — ED Notes (Signed)
Pt reports that he feels a little better.  Pt tolerating p.o fluids.
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
## 2013-02-22 NOTE — ED Notes (Signed)
Per family, pt has had some SOB for about 1 week, worsening tonight.   Family reports pt did have a nebulizer treatment and used his inhaler with minimal relief.

## 2013-02-22 NOTE — Discharge Instructions (Signed)
Your xray shows no changes - your ECG shows no changes and no signs of heart attack.  Prednisone once daily for 5 days at 40mg  a day, then back to 10mg  a day  Albuterol inhaler in addition to your other medicines - use every 4 hours (2 puffs) for 24 hours and then every 4 hours as needed.  Please call your doctor for a followup appointment within 24-48 hours. When you talk to your doctor please let them know that you were seen in the emergency department and have them acquire all of your records so that they can discuss the findings with you and formulate a treatment plan to fully care for your new and ongoing problems.

## 2013-02-28 ENCOUNTER — Ambulatory Visit: Payer: Medicare Other | Admitting: Internal Medicine

## 2013-03-04 ENCOUNTER — Encounter: Payer: Self-pay | Admitting: Internal Medicine

## 2013-03-04 ENCOUNTER — Ambulatory Visit (INDEPENDENT_AMBULATORY_CARE_PROVIDER_SITE_OTHER): Payer: Medicare Other | Admitting: Internal Medicine

## 2013-03-04 VITALS — BP 120/80 | HR 70 | Temp 98.1°F | Wt 176.8 lb

## 2013-03-04 DIAGNOSIS — D869 Sarcoidosis, unspecified: Secondary | ICD-10-CM

## 2013-03-04 DIAGNOSIS — R0609 Other forms of dyspnea: Secondary | ICD-10-CM

## 2013-03-04 DIAGNOSIS — I10 Essential (primary) hypertension: Secondary | ICD-10-CM

## 2013-03-04 DIAGNOSIS — R0600 Dyspnea, unspecified: Secondary | ICD-10-CM

## 2013-03-04 DIAGNOSIS — R0989 Other specified symptoms and signs involving the circulatory and respiratory systems: Secondary | ICD-10-CM

## 2013-03-04 NOTE — Assessment & Plan Note (Signed)
The goal with a chronic steroid dependent illness is always arriving at the lowest effective dose that controls the disease/symptoms and not accepting a set "formula" which is based on statistics or guidelines that don't always take into account patient  variability or the natural hx of the dz in every individual patient, which may well vary over time.  For now therefore I recommend the patient maintain  10 mg daily for now though not clear there is any active sarcoid at  present

## 2013-03-04 NOTE — Assessment & Plan Note (Signed)
-   ex intol since age 58 - trial off acei 10/12/2012  -11/19/2012   Walked RA x one lap @ 185 stopped due to  Legs gave out no desat  - Spirometry 11/19/2012  FEV1  1.29 (49) ratio 55  - trial of breo samples 11/19/2012 >>>  Symptoms are markedly disproportionate to objective findings and not clear this is a lung problem but pt does appear to have difficult airway management issues. DDX of  difficult airways managment all start with A and  include Adherence, Ace Inhibitors, Acid Reflux, Active Sinus Disease, Alpha 1 Antitripsin deficiency, Anxiety masquerading as Airways dz,  ABPA,  allergy(esp in young), Aspiration (esp in elderly), Adverse effects of DPI,  Active smokers, plus two Bs  = Bronchiectasis and Beta blocker use..and one C= CHF  Adherence is always the initial "prime suspect" and is a multilayered concern that requires a "trust but verify" approach in every patient - starting with knowing how to use medications, especially inhalers, correctly, keeping up with refills and understanding the fundamental difference between maintenance and prns vs those medications only taken for a very short course and then stopped and not refilled.  - not able to achieve accurate med reconciliation despite multiple attemps and informed pt and fm they would need to meet [place] half way by using the med calendar we provided (and scanned into the record) and on next ov bring all meds too - The proper method of use, as well as anticipated side effects, of a metered-dose inhaler are discussed and demonstrated to the patient. Improved effectiveness after extensive coaching during this visit to a level of approximately  25% with hfa and 75% with dpi so rehchallenge with breo samples x one month then return    Each maintenance medication was reviewed in detail including most importantly the difference between maintenance and as needed and under what circumstances the prns are to be used. This was done in the context of a  medication calendar review which provided the patient with a user-friendly unambiguous mechanism for medication administration and reconciliation and provides an action plan for all active problems. It is critical that this be shown to every doctor  for modification during the office visit if necessary so the patient can use it as a working document.

## 2013-03-04 NOTE — Progress Notes (Signed)
Subjective:    Patient ID: Charles [name], male    DOB: 1955/10/26    MRN: [id number]  Brief patient profile:  18 yowm dx quit smoking in 1994 first aware of sob age 58 referred to pulmonary clinic 10/11/12 with dx of sarcoid   History of Present Illness   Deaf [name] -Interpretor  Hx of CVA w/ hemiplegia   10/11/2012  1st [place] pulmonary eval cc short of breath ? When did you first notice it ?  Mother "well the doctor's discovered" it dx with sarcoid in [place] and placed on prednisone since decades and maintained on prednisone and prinizil.  He was able to play sports in high school but mother says ?always needed advair (note advair wasn't made until he was 15) "well he was always on some pump" (interesting comment as he showed no capacity at all to use hfa).  Thru the interpreter after mutliple back and forths and asking the mother to let him answer the question, he has been variably sob since age 67, sometimes can't get across the room s giving out but never at rest, never sleeping, assoc with dry cough ? Since when  > on ACEi  Dx and rx as sarcoid by [name] doc on pred 10 mg per day but not clear whether ever changed his breathing for the better and so maint on pred 10 mg per day  >>D/C ace and advair    10/25/2012 Follow up and Med review (Interpretor and family present for visit)  Patient returns for a two-week followup and medication review. We reviewed all his medications and organized them into medication calendar with patient education. ACE and Advair stopped last ov . No flare in cough or dyspnea. Uses ProAir most days in morning.  Remains on prednisone 10mg  daily  Appears to be taking meds correctly . Brother gives him his meds.   rec Follow med calendar closely and bring to each visit.  Use ProAir Inhaler only as needed for wheezing /shortness of breath    11/19/2012 f/u ov/Lavaughn Bisig re: sarcoid/asthma/copd  On pred 10 mg daily did not bring calendar not apparently  using it Chief Complaint  Patient presents with  . Follow-up    pt reports breathing is about the same since last OV, breathing is "different and harder " per mother patients has increased wheezing --  will be having surgery soon for tooth extraction  sob x 75 ft per brother Brother confused with med instructions re saba  despite calendar, says doe x across the room every time he tries. Noct spells of choking ever since stroke but swallowing ok. rec Try reducing prednisone to one half daily  Add pepcid 20 mg one at bedtime Add Breo one puff each am See calendar for specific medication   03/04/2013 f/u ov/Juan Kissoon re: sob / did not bring med calenar or all meds  Chief Complaint  Patient presents with  . Follow-up    went to ED 02/22/13 for SOB. C/o nasal congestion, breathing feels limited, clears throat frequently, PND left side.  Fm very confused with meds brother "you gave him spiriva" (no indication we did)  Pt thru interpreter says doing much better p proair but shows very little capacity to use the saba delivery methold and does not have neb.    No obvious day to day or daytime variabilty or assoc chronic cough or cp or chest tightness, subjective wheeze overt sinus or hb symptoms. No unusual exp hx or h/o childhood pna/  asthma or knowledge of premature birth.    Also denies any obvious fluctuation of symptoms with weather or environmental changes or other aggravating or alleviating factors except as outlined above   Current Medications, Allergies, Complete Past Medical History, Past Surgical History, Family History, and Social History were reviewed in [employer] record.  ROS  The following are not active complaints unless bolded sore throat, dysphagia, dental problems, itching, sneezing,  nasal congestion or excess/ purulent secretions, ear ache,   fever, chills, sweats, unintended wt loss, pleuritic or exertional cp, hemoptysis,  orthopnea pnd or leg swelling,  presyncope, palpitations, heartburn, abdominal pain, anorexia, nausea, vomiting, diarrhea  or change in bowel or urinary habits, change in stools or urine, dysuria,hematuria,  rash, arthralgias, visual complaints, headache, numbness weakness or ataxia or problems with walking or coordination,  change in mood/affect or memory.              Objective:   Physical Exam  W/c bound bm  Deaf   Interpretor in room   HEENT: nl dentition, turbinates, and orophanx. Nl external ear canals without cough reflex   NECK :  without JVD/Nodes/TM/ nl carotid upstrokes bilaterally   LUNGS: no acc muscle use, clear to A and P bilaterally without cough on insp or exp maneuvers   CV:  RRR  no s3 or murmur or increase in P2, no edema   ABD:  soft and nontender with nl excursion in the supine position. No bruits or organomegaly, bowel sounds nl  MS:  warm without deformities, calf tenderness, cyanosis or clubbing  SKIN: warm and dry without lesions      CXR  02/22/13 Chronic changes in the chest consistent with history of sarcoidosis.  No acute process demonstrated.      Assessment & Plan:

## 2013-03-04 NOTE — Patient Instructions (Addendum)
Add Breo one puff each am.(you have 28 days of samples at this dose)  And continue spiriva one capsule each am for now  Continue prednsine 10 mg one daily for now  See calendar for specific medication instructions and bring it back for each and every office visit for every healthcare provider you see.  Without it,  you may not receive the best quality medical care that we feel you deserve.  You will note that the calendar groups together  your maintenance  medications that are timed at particular times of the day.  Think of this as your checklist for what your doctor has instructed you to do until your next evaluation to see what benefit  there is  to staying on a consistent group of medications intended to keep you well.  The other group at the bottom is entirely up to you to use as you see fit  for specific symptoms that may arise between visits that require you to treat them on an as needed basis.  Think of this as your action plan or "what if" list.   Separating the top medications from the bottom group is fundamental to providing you adequate care going forward.    Please schedule a follow up office visit in 4 weeks, sooner if needed with all active  medications, and med calendar  Late add consider trial off losartan and on valsatran next ov

## 2013-03-04 NOTE — Assessment & Plan Note (Signed)
For reasons not clear to me, losartan in the generic form has been reported now from mulitple sources, the most recent of which = Journal of Immunology, to cause the same pattern of upper airway symptoms as seen with acei.   This has not been reported with exposure to the other ARB's to date, so it seems reasonable for now to try either generic diovan or avapro if ARB needed.

## 2013-03-12 ENCOUNTER — Ambulatory Visit: Payer: Medicare Other | Attending: Neurology | Admitting: Occupational Therapy

## 2013-03-12 DIAGNOSIS — M6281 Muscle weakness (generalized): Secondary | ICD-10-CM | POA: Insufficient documentation

## 2013-03-12 DIAGNOSIS — M2550 Pain in unspecified joint: Secondary | ICD-10-CM | POA: Diagnosis not present

## 2013-03-12 DIAGNOSIS — IMO0001 Reserved for inherently not codable concepts without codable children: Secondary | ICD-10-CM | POA: Diagnosis present

## 2013-03-12 DIAGNOSIS — M629 Disorder of muscle, unspecified: Secondary | ICD-10-CM | POA: Insufficient documentation

## 2013-03-12 DIAGNOSIS — M2420 Disorder of ligament, unspecified site: Secondary | ICD-10-CM | POA: Insufficient documentation

## 2013-03-12 DIAGNOSIS — I69959 Hemiplegia and hemiparesis following unspecified cerebrovascular disease affecting unspecified side: Secondary | ICD-10-CM | POA: Diagnosis not present

## 2013-03-18 ENCOUNTER — Ambulatory Visit (INDEPENDENT_AMBULATORY_CARE_PROVIDER_SITE_OTHER): Payer: Medicare Other | Admitting: Neurology

## 2013-03-18 ENCOUNTER — Encounter: Payer: Self-pay | Admitting: Neurology

## 2013-03-18 ENCOUNTER — Ambulatory Visit: Payer: Medicare Other | Admitting: Occupational Therapy

## 2013-03-18 VITALS — BP 143/80 | HR 86

## 2013-03-18 DIAGNOSIS — IMO0001 Reserved for inherently not codable concepts without codable children: Secondary | ICD-10-CM | POA: Diagnosis not present

## 2013-03-18 DIAGNOSIS — G8110 Spastic hemiplegia affecting unspecified side: Secondary | ICD-10-CM

## 2013-03-18 MED ORDER — ONABOTULINUMTOXINA 100 UNITS IJ SOLR: 300.0000 [IU] | Freq: Once | INTRAMUSCULAR | Status: DC
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
## 2013-03-21 ENCOUNTER — Ambulatory Visit: Payer: Medicare Other | Admitting: Occupational Therapy

## 2013-03-21 DIAGNOSIS — IMO0001 Reserved for inherently not codable concepts without codable children: Secondary | ICD-10-CM | POA: Diagnosis not present

## 2013-03-26 ENCOUNTER — Ambulatory Visit (INDEPENDENT_AMBULATORY_CARE_PROVIDER_SITE_OTHER): Payer: Medicare Other | Admitting: Neurology

## 2013-03-26 ENCOUNTER — Encounter (INDEPENDENT_AMBULATORY_CARE_PROVIDER_SITE_OTHER): Payer: Self-pay

## 2013-03-26 ENCOUNTER — Ambulatory Visit: Payer: Medicare Other | Attending: Neurology | Admitting: Occupational Therapy

## 2013-03-26 DIAGNOSIS — G544 Lumbosacral root disorders, not elsewhere classified: Secondary | ICD-10-CM

## 2013-03-26 DIAGNOSIS — R269 Unspecified abnormalities of gait and mobility: Secondary | ICD-10-CM | POA: Insufficient documentation

## 2013-03-26 DIAGNOSIS — IMO0001 Reserved for inherently not codable concepts without codable children: Secondary | ICD-10-CM | POA: Insufficient documentation

## 2013-03-26 DIAGNOSIS — M6281 Muscle weakness (generalized): Secondary | ICD-10-CM | POA: Insufficient documentation

## 2013-03-26 DIAGNOSIS — Z0289 Encounter for other administrative examinations: Secondary | ICD-10-CM

## 2013-03-26 NOTE — Procedures (Signed)
     HISTORY:  Charles [name] is a 58 year old gentleman with a history of a stroke with a left hemiparesis. The patient has had issues with left leg pain and low back pain. The patient is being evaluated for a possible lumbosacral radiculopathy. The patient does have a history of diabetes, hypertension, and sarcoidosis.  NERVE CONDUCTION STUDIES:  Nerve conduction studies were performed on bilateral lower extremities. The distal motor latencies and motor amplitudes for the peroneal and posterior tibial nerves were within normal limits. The nerve conduction velocities for these nerves were also normal. The H reflex latencies were normal. The sensory latencies for the peroneal nerves were within normal limits.    EMG STUDIES:  EMG study was performed on the left lower extremity:  The tibialis anterior muscle reveals no voluntary motor units. No fibrillations or positive waves were seen. The peroneus tertius muscle reveals no voluntary motor units. One plus fibrillations and positive waves were seen. The medial gastrocnemius muscle reveals no voluntary motor units. 2+ fibrillations and positive waves were seen. The vastus lateralis muscle reveals 2 to 3K motor units with decreased recruitment. No fibrillations or positive waves were seen. The iliopsoas muscle reveals 2 to 3K motor units with decreased recruitment. No fibrillations or positive waves were seen. The biceps femoris muscle (long head) reveals no voluntary motor units. No fibrillations or positive waves were seen. The lumbosacral paraspinal muscles were tested at 3 levels, and revealed no abnormalities of insertional activity at the upper level tested. One plus fibrillations and positive waves were seen at the middle and lower levels. There was good relaxation.   IMPRESSION:  Nerve conduction studies done on both lower extremities were unremarkable. No evidence of a peripheral neuropathy is seen. EMG evaluation of the left lower  extremity is somewhat technically difficult as the patient has a left hemiparesis, and he is unable to voluntarily activate several muscles of the left lower extremity. The EMG however, is most consistent with a mild acute left S1 radiculopathy.  Jill Alexanders MD 03/26/2013 1:38 PM  [facility] [address] [place], [place] [address]  Phone [phone] Fax [phone]

## 2013-03-28 ENCOUNTER — Ambulatory Visit: Payer: Medicare Other | Admitting: Occupational Therapy

## 2013-04-01 ENCOUNTER — Ambulatory Visit (INDEPENDENT_AMBULATORY_CARE_PROVIDER_SITE_OTHER): Payer: Medicare Other | Admitting: Internal Medicine

## 2013-04-01 ENCOUNTER — Encounter: Payer: Self-pay | Admitting: Internal Medicine

## 2013-04-01 VITALS — BP 146/88 | HR 74 | Temp 97.5°F | Ht 64.0 in | Wt 176.0 lb

## 2013-04-01 DIAGNOSIS — R0989 Other specified symptoms and signs involving the circulatory and respiratory systems: Secondary | ICD-10-CM

## 2013-04-01 DIAGNOSIS — R0609 Other forms of dyspnea: Secondary | ICD-10-CM

## 2013-04-01 DIAGNOSIS — R0600 Dyspnea, unspecified: Secondary | ICD-10-CM

## 2013-04-01 DIAGNOSIS — D869 Sarcoidosis, unspecified: Secondary | ICD-10-CM

## 2013-04-01 NOTE — Assessment & Plan Note (Signed)
-   reduced prednisone to 5 mg daily 11/19/2012 >increased pred to 10 mg per day 02/22/2013 > rechallenged with 5 mg daily  The goal with a chronic steroid dependent illness is always arriving at the lowest effective dose that controls the disease/symptoms and not accepting a set "formula" which is based on statistics or guidelines that don't always take into account patient  variability or the natural hx of the dz in every individual patient, which may well vary over time.  For now therefore I recommend the patient maintain  A floor of 5 mg daily if tolerates as no evidence of active sarcoid and getting ICS from breo.

## 2013-04-01 NOTE — Patient Instructions (Signed)
Keep working on Gaffer - think about it like taking a drag off a cigarette.  Try lowering prednisone dose to 10 mg one half daily   Please schedule a follow up office visit in 6 weeks, call sooner if needed

## 2013-04-01 NOTE — Progress Notes (Signed)
Subjective:    Patient ID: Charles [name], male    DOB: 04-08-1955    MRN: [id number]  Brief patient profile:  85 yowm dx quit smoking in 1994 first aware of sob age 58 referred to pulmonary clinic 10/11/12 with dx of sarcoid   History of Present Illness   [name] -Interpretor  Hx of CVA w/ hemiplegia   10/11/2012  1st [place] pulmonary eval cc short of breath ? When did you first notice it ?  Mother "well the doctor's discovered" it dx with sarcoid in [place] and placed on prednisone since decades and maintained on prednisone and prinizil.  He was able to play sports in high school but mother says ?always needed advair (note advair wasn't made until he was 12) "well he was always on some pump" (interesting comment as he showed no capacity at all to use hfa).  Thru the interpreter after mutliple back and forths and asking the mother to let him answer the question, he has been variably sob since age 58, sometimes can't get across the room s giving out but never at rest, never sleeping, assoc with dry cough ? Since when  > on ACEi  Dx and rx as sarcoid by [name] doc on pred 10 mg per day but not clear whether ever changed his breathing for the better and so maint on pred 10 mg per day  >>D/C ace and advair    10/25/2012 Follow up and Med review (Interpretor and family present for visit)  Patient returns for a two-week followup and medication review. We reviewed all his medications and organized them into medication calendar with patient education. ACE and Advair stopped last ov . No flare in cough or dyspnea. Uses ProAir most days in morning.  Remains on prednisone 10mg  daily  Appears to be taking meds correctly . Brother gives him his meds.   rec Follow med calendar closely and bring to each visit.  Use ProAir Inhaler only as needed for wheezing /shortness of breath    11/19/2012 f/u ov/Charles [name] re: sarcoid/asthma/copd  On pred 10 mg daily did not bring calendar not apparently  using it Chief Complaint  Patient presents with  . Follow-up    pt reports breathing is about the same since last OV, breathing is "different and harder " per mother patients has increased wheezing --  will be having surgery soon for tooth extraction  sob x 75 ft per brother Brother confused with med instructions re saba  despite calendar, says doe x across the room every time he tries. Noct spells of choking ever since stroke but swallowing ok. rec Try reducing prednisone to one half daily  Add pepcid 20 mg one at bedtime Add Breo one puff each am See calendar for specific medication   03/04/2013 f/u ov/Charles [name] re: sob / did not bring med calenar or all meds  Chief Complaint  Patient presents with  . Follow-up    went to ED 02/22/13 for SOB. C/o nasal congestion, breathing feels limited, clears throat frequently, PND left side.  Fm very confused with meds brother "you gave him spiriva" (no indication we did)  Pt thru interpreter says doing much better p proair but shows very little capacity to use the saba delivery methold and does not have neb.  rec Add Breo one puff each am.(you have 28 days of samples at this dose)  And continue spiriva one capsule each am for now Continue prednsine 10 mg one daily for now   .  04/01/2013 f/u ov/Charles [name] re: asthma/ sarcoid  Chief Complaint  Patient presents with  . Follow-up    Pt c/o food getting stuck in throat.  Denies cough, SOB.    Once a week feels food gets stuck in neck, better if swallows water. Not needing saba any more, still very poor mdi/dpi (see a/p_   No obvious day to day or daytime variabilty or assoc chronic cough or cp or chest tightness, subjective wheeze overt sinus or hb symptoms. No unusual exp hx or h/o childhood pna/ asthma or knowledge of premature birth.    Also denies any obvious fluctuation of symptoms with weather or environmental changes or other aggravating or alleviating factors except as outlined above   Current  Medications, Allergies, Complete Past Medical History, Past Surgical History, Family History, and Social History were reviewed in [employer] record.  ROS  The following are not active complaints unless bolded sore throat, dysphagia, dental problems, itching, sneezing,  nasal congestion or excess/ purulent secretions, ear ache,   fever, chills, sweats, unintended wt loss, pleuritic or exertional cp, hemoptysis,  orthopnea pnd or leg swelling, presyncope, palpitations, heartburn, abdominal pain, anorexia, nausea, vomiting, diarrhea  or change in bowel or urinary habits, change in stools or urine, dysuria,hematuria,  rash, arthralgias, visual complaints, headache, numbness weakness or ataxia or problems with walking or coordination,  change in mood/affect or memory.              Objective:   Physical Exam  W/c bound bm  Deaf   Interpretor in room   HEENT: nl dentition, turbinates, and orophanx. Nl external ear canals without cough reflex   NECK :  without JVD/Nodes/TM/ nl carotid upstrokes bilaterally   LUNGS: no acc muscle use, clear to A and P bilaterally without cough on insp or exp maneuvers   CV:  RRR  no s3 or murmur or increase in P2, no edema   ABD:  soft and nontender with nl excursion in the supine position. No bruits or organomegaly, bowel sounds nl  MS:  warm without deformities, calf tenderness, cyanosis or clubbing  SKIN: warm and dry without lesions      CXR  02/22/13 Chronic changes in the chest consistent with history of sarcoidosis.  No acute process demonstrated.      Assessment & Plan:

## 2013-04-01 NOTE — Assessment & Plan Note (Addendum)
-   ex intol since age 58 - trial off acei 10/12/2012  -11/19/2012   Walked RA x one lap @ 185 stopped due to  Legs gave out no desat  - Spirometry 11/19/2012  FEV1  1.29 (49) ratio 55  - trial of breo samples 11/19/2012 >> improved on breo/spiriva 04/01/2013   Not clear how much is asthma/copd vs uacs but doing better except for swallowing on present rx - still possible this is related to effects of dpi but only has it once a week so ok to continue dpi only for now, use saba a prn   The proper method of use, as well as anticipated side effects, of a metered-dose inhaler are discussed and demonstrated to the patient. Improved effectiveness after extensive coaching during this visit to a level of approximately  75%    Each maintenance medication was reviewed in detail including most importantly the difference between maintenance and as needed and under what circumstances the prns are to be used. This was done in the context of a medication calendar review which provided the patient with a user-friendly unambiguous mechanism for medication administration and reconciliation and provides an action plan for all active problems. It is critical that this be shown to every doctor  for modification during the office visit if necessary so the patient can use it as a working document.

## 2013-04-02 ENCOUNTER — Ambulatory Visit: Payer: Medicare Other | Admitting: Occupational Therapy

## 2013-04-04 ENCOUNTER — Ambulatory Visit: Payer: Medicare Other | Admitting: Physical Therapy

## 2013-04-04 ENCOUNTER — Ambulatory Visit: Payer: Medicare Other | Admitting: Occupational Therapy

## 2013-04-05 ENCOUNTER — Encounter: Payer: Medicare Other | Admitting: Occupational Therapy

## 2013-04-09 ENCOUNTER — Ambulatory Visit: Payer: Medicare Other | Admitting: Rehabilitative and Restorative Service Providers"

## 2013-04-09 ENCOUNTER — Encounter: Payer: Medicare Other | Admitting: Occupational Therapy

## 2013-04-09 ENCOUNTER — Ambulatory Visit: Payer: Medicare Other | Admitting: Physical Therapy

## 2013-04-12 ENCOUNTER — Ambulatory Visit: Payer: Medicare Other | Admitting: Occupational Therapy

## 2013-04-12 ENCOUNTER — Ambulatory Visit: Payer: Medicare Other | Attending: Orthopedic Surgery | Admitting: Physical Therapy

## 2013-04-12 ENCOUNTER — Ambulatory Visit: Payer: Medicare Other | Admitting: Physical Therapy

## 2013-04-12 DIAGNOSIS — R269 Unspecified abnormalities of gait and mobility: Secondary | ICD-10-CM | POA: Insufficient documentation

## 2013-04-12 DIAGNOSIS — R5381 Other malaise: Secondary | ICD-10-CM | POA: Insufficient documentation

## 2013-04-16 ENCOUNTER — Ambulatory Visit: Payer: Medicare Other | Admitting: Physical Therapy

## 2013-04-18 ENCOUNTER — Ambulatory Visit: Payer: Medicare Other | Admitting: Physical Therapy

## 2013-04-24 ENCOUNTER — Ambulatory Visit: Payer: Medicare Other | Attending: Neurology | Admitting: Physical Therapy

## 2013-04-24 DIAGNOSIS — M2420 Disorder of ligament, unspecified site: Secondary | ICD-10-CM | POA: Insufficient documentation

## 2013-04-24 DIAGNOSIS — M6281 Muscle weakness (generalized): Secondary | ICD-10-CM | POA: Insufficient documentation

## 2013-04-24 DIAGNOSIS — M629 Disorder of muscle, unspecified: Secondary | ICD-10-CM | POA: Insufficient documentation

## 2013-04-24 DIAGNOSIS — I69959 Hemiplegia and hemiparesis following unspecified cerebrovascular disease affecting unspecified side: Secondary | ICD-10-CM | POA: Insufficient documentation

## 2013-04-24 DIAGNOSIS — M2550 Pain in unspecified joint: Secondary | ICD-10-CM | POA: Insufficient documentation

## 2013-04-24 DIAGNOSIS — IMO0001 Reserved for inherently not codable concepts without codable children: Secondary | ICD-10-CM | POA: Insufficient documentation

## 2013-04-26 ENCOUNTER — Ambulatory Visit: Payer: Medicare Other | Admitting: Physical Therapy

## 2013-04-30 ENCOUNTER — Ambulatory Visit: Payer: Medicare Other | Admitting: Physical Therapy

## 2013-05-03 ENCOUNTER — Ambulatory Visit: Payer: Medicare Other | Admitting: Physical Therapy

## 2013-05-06 ENCOUNTER — Ambulatory Visit: Payer: Medicare Other | Admitting: Physical Therapy

## 2013-05-08 ENCOUNTER — Ambulatory Visit: Payer: Medicare Other | Admitting: Physical Therapy

## 2013-05-13 ENCOUNTER — Ambulatory Visit (INDEPENDENT_AMBULATORY_CARE_PROVIDER_SITE_OTHER): Payer: Medicare Other | Admitting: Internal Medicine

## 2013-05-13 ENCOUNTER — Encounter: Payer: Self-pay | Admitting: Internal Medicine

## 2013-05-13 VITALS — BP 160/92 | HR 99 | Temp 98.1°F | Ht 64.0 in | Wt 175.0 lb

## 2013-05-13 DIAGNOSIS — R0989 Other specified symptoms and signs involving the circulatory and respiratory systems: Secondary | ICD-10-CM

## 2013-05-13 DIAGNOSIS — D869 Sarcoidosis, unspecified: Secondary | ICD-10-CM

## 2013-05-13 DIAGNOSIS — R0600 Dyspnea, unspecified: Secondary | ICD-10-CM

## 2013-05-13 DIAGNOSIS — R0609 Other forms of dyspnea: Secondary | ICD-10-CM

## 2013-05-13 NOTE — Progress Notes (Signed)
Subjective:    Patient ID: Charles [name], male    DOB: [birth year]    MRN: [id number]  Brief patient profile:  58 yowm dx quit smoking in 1994 first aware of sob age 58 referred to pulmonary clinic 10/11/12 with dx of sarcoid and atypical sob ? acei effect    History of Present Illness   [name] -Interpretor  Hx of CVA w/ hemiplegia   10/11/2012  1st [place] pulmonary eval cc short of breath ? When did you first notice it ?  Mother "well the doctor's discovered" it dx with sarcoid in [place] and placed on prednisone since decades and maintained on prednisone and prinizil.  He was able to play sports in high school but mother says ?always needed advair (note advair wasn't made until he was 9) "well he was always on some pump" (interesting comment as he showed no capacity at all to use hfa).  Thru the interpreter after mutliple back and forths and asking the mother to let him answer the question, he has been variably sob since age 58, sometimes can't get across the room s giving out but never at rest, never sleeping, assoc with dry cough ? Since when  > on ACEi  Dx and rx as sarcoid by [name] pulmonary doc on pred 10 mg per day but not clear whether ever changed his breathing for the better and so maint on pred 10 mg per day  >>D/C acei and advair    10/25/2012 Follow up and Med review (Interpretor and family present for visit)  Patient returns for a two-week followup and medication review. We reviewed all his medications and organized them into medication calendar with patient education. ACE and Advair stopped last ov . No flare in cough or dyspnea. Uses ProAir most days in morning.  Remains on prednisone 10mg  daily  Appears to be taking meds correctly . Brother gives him his meds.   rec Follow med calendar closely and bring to each visit.  Use ProAir Inhaler only as needed for wheezing /shortness of breath    11/19/2012 f/u ov/Jadelynn Boylan re: sarcoid/asthma/copd  On pred 10 mg daily did not  bring calendar not apparently using it Chief Complaint  Patient presents with  . Follow-up    pt reports breathing is about the same since last OV, breathing is "different and harder " per mother patients has increased wheezing --  will be having surgery soon for tooth extraction  sob x 75 ft per brother Brother confused with med instructions re saba  despite calendar, says doe x across the room every time he tries. Noct spells of choking ever since stroke but swallowing ok. rec Try reducing prednisone to one half daily  Add pepcid 20 mg one at bedtime Add Breo one puff each am See calendar for specific medication   03/04/2013 f/u ov/Stark Aguinaga re: sob / did not bring med calenar or all meds  Chief Complaint  Patient presents with  . Follow-up    went to ED 02/22/13 for SOB. C/o nasal congestion, breathing feels limited, clears throat frequently, PND left side.  Fm very confused with meds brother "you gave him spiriva" (no indication we did)  Pt thru interpreter says doing much better p proair but shows very little capacity to use the saba delivery methold and does not have neb.  rec Add Breo one puff each am.(you have 28 days of samples at this dose)  And continue spiriva one capsule each am for now Continue prednsine  10 mg one daily for now   . 04/01/2013 f/u ov/Konstantina Nachreiner re: asthma/ sarcoid  Chief Complaint  Patient presents with  . Follow-up    Pt c/o food getting stuck in throat.  Denies cough, SOB.    Once a week feels food gets stuck in neck, better if swallows water. Not needing saba any more, still very poor mdi/dpi (see a/p) rec Keep working on perfecting inhaler technique - think about it like taking a drag off a cigarette. Try lowering prednisone dose to 10 mg one half daily  .05/13/2013 f/u ov/Janifer Gieselman re: sarcoid/ pred down to 5 mg per day / ? ab Chief Complaint  Patient presents with  . Follow-up    Pt still c/o diff with swallowing.   hip stops before the breathing  - [name]  fu  Best breathing he's had in years Some dry mouth and dysphagia  No obvious day to day or daytime variabilty or assoc chronic cough or cp or chest tightness, subjective wheeze overt sinus or hb symptoms. No unusual exp hx or h/o childhood pna/ asthma or knowledge of premature birth.    Also denies any obvious fluctuation of symptoms with weather or environmental changes or other aggravating or alleviating factors except as outlined above   Current Medications, Allergies, Complete Past Medical History, Past Surgical History, Family History, and Social History were reviewed in [employer] record.  ROS  The following are not active complaints unless bolded sore throat, dysphagia, dental problems, itching, sneezing,  nasal congestion or excess/ purulent secretions, ear ache,   fever, chills, sweats, unintended wt loss, pleuritic or exertional cp, hemoptysis,  orthopnea pnd or leg swelling, presyncope, palpitations, heartburn, abdominal pain, anorexia, nausea, vomiting, diarrhea  or change in bowel or urinary habits, change in stools or urine, dysuria,hematuria,  rash, arthralgias, visual complaints, headache, numbness weakness or ataxia or problems with walking or coordination,  change in mood/affect or memory.              Objective:   Physical Exam  W/c bound bm  Deaf   Wt Readings from Last 3 Encounters:  05/13/13 175 lb (79.379 kg)  04/01/13 176 lb (79.833 kg)  03/04/13 176 lb 12.8 oz (80.196 kg)      Interpretor in room   HEENT: nl dentition, turbinates, and orophanx. Nl external ear canals without cough reflex   NECK :  without JVD/Nodes/TM/ nl carotid upstrokes bilaterally   LUNGS: no acc muscle use, clear to A and P bilaterally without cough on insp or exp maneuvers   CV:  RRR  no s3 or murmur or increase in P2, no edema   ABD:  soft and nontender with nl excursion in the supine position. No bruits or organomegaly, bowel sounds nl  MS:  warm  without deformities, calf tenderness, cyanosis or clubbing  SKIN: warm and dry without lesions      CXR  02/22/13 Chronic changes in the chest consistent with history of sarcoidosis.  No acute process demonstrated.      Assessment & Plan:

## 2013-05-13 NOTE — Patient Instructions (Addendum)
Try off spiriva to see if your swallowing and stomach feel better and your breathing stays the same without the spiriva, and if so you don't need the spiriva   Please schedule a follow up visit in 3 months but call sooner if needed with cxr on return

## 2013-05-14 ENCOUNTER — Encounter: Payer: Self-pay | Admitting: Cardiology

## 2013-05-14 ENCOUNTER — Ambulatory Visit: Payer: Medicare Other | Admitting: Physical Therapy

## 2013-05-14 NOTE — Assessment & Plan Note (Signed)
-   ex intol since age 58 - trial off acei 10/12/2012  -11/19/2012   Walked RA x one lap @ 185 stopped due to  Legs gave out no desat  - Spirometry 11/19/2012  FEV1  1.29 (49) ratio 55  - trial of breo samples 11/19/2012 >> improved on breo/spiriva 04/01/2013 >   No longer limited by breathing and the breo seems to have made the most difference > try off spiriva esp since it may be responsible for some of his upper airway/ GI issues    Each maintenance medication was reviewed in detail including most importantly the difference between maintenance and as needed and under what circumstances the prns are to be used. This was done in the context of a medication calendar review which provided the patient with a user-friendly unambiguous mechanism for medication administration and reconciliation and provides an action plan for all active problems. It is critical that this be shown to every doctor  for modification during the office visit if necessary so the patient can use it as a working document.

## 2013-05-14 NOTE — Assessment & Plan Note (Signed)
-   no records in emr 11/20/2012  - reduced prednisone to 5 mg daily 11/19/2012 >increased pred to 10 mg per day 02/22/2013 > rechallenged with 5 mg daily 2/9/201  The goal with a chronic steroid dependent illness is always arriving at the lowest effective dose that controls the disease/symptoms and not accepting a set "formula" which is based on statistics or guidelines that don't always take into account patient  variability or the natural hx of the dz in every individual patient, which may well vary over time.  For now therefore I recommend the patient maintain  5 mg per day but if doing well on next ov try reduce to more physiologic level = 2.5 mg daily

## 2013-05-16 ENCOUNTER — Ambulatory Visit: Payer: Medicare Other | Admitting: Physical Therapy

## 2013-06-10 ENCOUNTER — Telehealth: Payer: Self-pay | Admitting: Neurology

## 2013-06-10 NOTE — Telephone Encounter (Signed)
Called patient back left message to return the call.

## 2013-06-10 NOTE — Telephone Encounter (Signed)
Spoke with patient's mother Ms. Titus, informed her that Dr. Janann Colonel is out of the office this week and doesn't have any available appointments and that it was best to keep the appointment that's already scheduled. She also asked why some people did not know that a translator is needed at his appointment, I explained to her that we do not schedule for the translator, that the system generates it every time an appointment is made for him, she expressed understanding, stated that she was sorry and didn't mean to be rude.

## 2013-06-10 NOTE — Telephone Encounter (Signed)
Pt's mom called to see if she could make an apt, mom states she feels like pt is losing his memory and needs to get in to see the Dr. Abbott Pao has an apt on the 29th of April for Botox and wanted to know if he can speak to Dr. Janann Colonel concerning this matter or will she have to schedule another visit. Please call pt's mom concerning this matter. Pt's mom states he has to have an interpreter, she was very upset with me because I didn't know this and I apologized but I was trying to get everything in that she was requesting and I ask her not to get upset with me because I didn't know everything she thought I was suppose to know about the pt. Thanks

## 2013-06-11 ENCOUNTER — Ambulatory Visit: Payer: Medicare Other | Attending: Neurology | Admitting: Physical Therapy

## 2013-06-11 DIAGNOSIS — M2420 Disorder of ligament, unspecified site: Secondary | ICD-10-CM | POA: Insufficient documentation

## 2013-06-11 DIAGNOSIS — M6281 Muscle weakness (generalized): Secondary | ICD-10-CM | POA: Insufficient documentation

## 2013-06-11 DIAGNOSIS — M629 Disorder of muscle, unspecified: Secondary | ICD-10-CM | POA: Insufficient documentation

## 2013-06-11 DIAGNOSIS — IMO0001 Reserved for inherently not codable concepts without codable children: Secondary | ICD-10-CM | POA: Insufficient documentation

## 2013-06-11 DIAGNOSIS — I69959 Hemiplegia and hemiparesis following unspecified cerebrovascular disease affecting unspecified side: Secondary | ICD-10-CM | POA: Insufficient documentation

## 2013-06-11 DIAGNOSIS — M2550 Pain in unspecified joint: Secondary | ICD-10-CM | POA: Insufficient documentation

## 2013-06-12 ENCOUNTER — Ambulatory Visit: Payer: Medicare Other | Admitting: Physical Therapy

## 2013-06-12 ENCOUNTER — Telehealth: Payer: Self-pay

## 2013-06-12 NOTE — Telephone Encounter (Signed)
Pt's mother called to make that there will be a sign language interrupter here Thursday for her sons appointment. I told her that is  Was marked in the computer for one to come.

## 2013-06-13 ENCOUNTER — Encounter: Payer: Self-pay | Admitting: Gastroenterology

## 2013-06-13 ENCOUNTER — Encounter (HOSPITAL_COMMUNITY): Payer: Self-pay | Admitting: Pharmacy Technician

## 2013-06-13 ENCOUNTER — Encounter (INDEPENDENT_AMBULATORY_CARE_PROVIDER_SITE_OTHER): Payer: Self-pay

## 2013-06-13 ENCOUNTER — Other Ambulatory Visit: Payer: Self-pay | Admitting: Gastroenterology

## 2013-06-13 ENCOUNTER — Ambulatory Visit (INDEPENDENT_AMBULATORY_CARE_PROVIDER_SITE_OTHER): Payer: Medicare Other | Admitting: Gastroenterology

## 2013-06-13 VITALS — BP 158/91 | HR 78 | Temp 98.2°F | Ht 64.0 in | Wt 173.8 lb

## 2013-06-13 DIAGNOSIS — K3189 Other diseases of stomach and duodenum: Secondary | ICD-10-CM

## 2013-06-13 DIAGNOSIS — R1013 Epigastric pain: Secondary | ICD-10-CM | POA: Insufficient documentation

## 2013-06-13 DIAGNOSIS — R1310 Dysphagia, unspecified: Secondary | ICD-10-CM

## 2013-06-13 NOTE — Progress Notes (Signed)
cc'd to pcp 

## 2013-06-13 NOTE — Patient Instructions (Signed)
UPPER ENDOSCOPY TO POSSIBLY STRETCH YOUR ESOPHAGUS TO ADDRESS YOUR P[ROBLEMS WITH SWALLOWING  AND I MAY PLACE A CAPSULE IN YOUR ESOPHAGUS TO EVALUATE ACID REFLUX  FOLLOW A LOW FAT DIET. SEE INFO BELOW.  CONTINUE NEXIUM. TAKE 30 MINUTES BEFORE MEALS TWICE A DAY.  FOLLOW UP IN 3 MOS.    Low-Fat Diet BREADS, CEREALS, PASTA, RICE, DRIED PEAS, AND BEANS These products are high in carbohydrates and most are low in fat. Therefore, they can be increased in the diet as substitutes for fatty foods. They too, however, contain calories and should not be eaten in excess. Cereals can be eaten for snacks as well as for breakfast.   FRUITS AND VEGETABLES It is good to eat fruits and vegetables. Besides being sources of fiber, both are rich in vitamins and some minerals. They help you get the daily allowances of these nutrients. Fruits and vegetables can be used for snacks and desserts.  MEATS Limit lean meat, chicken, [place], and fish to no more than 6 ounces per day. Beef, Pork, and Lamb Use lean cuts of beef, pork, and lamb. Lean cuts include:  Extra-lean ground beef.  Arm roast.  Sirloin tip.  Center-cut ham.  Round steak.  Loin chops.  Rump roast.  Tenderloin.  Trim all fat off the outside of meats before cooking. It is not necessary to severely decrease the intake of red meat, but lean choices should be made. Lean meat is rich in protein and contains a highly absorbable form of iron. Premenopausal women, in particular, should avoid reducing lean red meat because this could increase the risk for low red blood cells (iron-deficiency anemia).  Chicken and [place] These are good sources of protein. The fat of poultry can be reduced by removing the skin and underlying fat layers before cooking. Chicken and [place] can be substituted for lean red meat in the diet. Poultry should not be fried or covered with high-fat sauces. Fish and Shellfish Fish is a good source of protein. Shellfish contain  cholesterol, but they usually are low in saturated fatty acids. The preparation of fish is important. Like chicken and [place], they should not be fried or covered with high-fat sauces. EGGS Egg whites contain no fat or cholesterol. They can be eaten often. Try 1 to 2 egg whites instead of whole eggs in recipes or use egg substitutes that do not contain yolk. MILK AND DAIRY PRODUCTS Use skim or 1% milk instead of 2% or whole milk. Decrease whole milk, natural, and processed cheeses. Use nonfat or low-fat (2%) cottage cheese or low-fat cheeses made from vegetable oils. Choose nonfat or low-fat (1 to 2%) yogurt. Experiment with evaporated skim milk in recipes that call for heavy cream. Substitute low-fat yogurt or low-fat cottage cheese for sour cream in dips and salad dressings. Have at least 2 servings of low-fat dairy products, such as 2 glasses of skim (or 1%) milk each day to help get your daily calcium intake. FATS AND OILS Reduce the total intake of fats, especially saturated fat. Butterfat, lard, and beef fats are high in saturated fat and cholesterol. These should be avoided as much as possible. Vegetable fats do not contain cholesterol, but certain vegetable fats, such as coconut oil, palm oil, and palm kernel oil are very high in saturated fats. These should be limited. These fats are often used in bakery goods, processed foods, popcorn, oils, and nondairy creamers. Vegetable shortenings and some peanut butters contain hydrogenated oils, which are also saturated fats. Read the  labels on these foods and check for saturated vegetable oils. Unsaturated vegetable oils and fats do not raise blood cholesterol. However, they should be limited because they are fats and are high in calories. Total fat should still be limited to 30% of your daily caloric intake. Desirable liquid vegetable oils are corn oil, cottonseed oil, olive oil, canola oil, safflower oil, soybean oil, and sunflower oil. Peanut oil is not  as good, but small amounts are acceptable. Buy a heart-healthy tub margarine that has no partially hydrogenated oils in the ingredients. Mayonnaise and salad dressings often are made from unsaturated fats, but they should also be limited because of their high calorie and fat content. Seeds, nuts, peanut butter, olives, and avocados are high in fat, but the fat is mainly the unsaturated type. These foods should be limited mainly to avoid excess calories and fat. OTHER EATING TIPS Snacks  Most sweets should be limited as snacks. They tend to be rich in calories and fats, and their caloric content outweighs their nutritional value. Some good choices in snacks are graham crackers, melba toast, soda crackers, bagels (no egg), English muffins, fruits, and vegetables. These snacks are preferable to snack crackers, [place] fries, TORTILLA CHIPS, and POTATO chips. Popcorn should be air-popped or cooked in small amounts of liquid vegetable oil. Desserts Eat fruit, low-fat yogurt, and fruit ices instead of pastries, cake, and cookies. Sherbet, angel food cake, gelatin dessert, frozen low-fat yogurt, or other frozen products that do not contain saturated fat (pure fruit juice bars, frozen ice pops) are also acceptable.  COOKING METHODS Choose those methods that use little or no fat. They include: Poaching.  Braising.  Steaming.  Grilling.  Baking.  Stir-frying.  Broiling.  Microwaving.  Foods can be cooked in a nonstick pan without added fat, or use a nonfat cooking spray in regular cookware. Limit fried foods and avoid frying in saturated fat. Add moisture to lean meats by using water, broth, cooking wines, and other nonfat or low-fat sauces along with the cooking methods mentioned above. Soups and stews should be chilled after cooking. The fat that forms on top after a few hours in the refrigerator should be skimmed off. When preparing meals, avoid using excess salt. Salt can contribute to raising blood  pressure in some people.  EATING AWAY FROM HOME Order entres, potatoes, and vegetables without sauces or butter. When meat exceeds the size of a deck of cards (3 to 4 ounces), the rest can be taken home for another meal. Choose vegetable or fruit salads and ask for low-calorie salad dressings to be served on the side. Use dressings sparingly. Limit high-fat toppings, such as bacon, crumbled eggs, cheese, sunflower seeds, and olives. Ask for heart-healthy tub margarine instead of butter.
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
## 2013-06-13 NOTE — Assessment & Plan Note (Signed)
TO SOLIDS. DIFFERENTIAL DIAGNOSIS INCLUDES STRICTURE OR 2O ESO MOTILITY DISORDER DUE TO UNCONTROLLED REFLUX  EGD/? DILATION Tuesday OPV IN 3 MOS

## 2013-06-13 NOTE — Telephone Encounter (Signed)
REVIEWED.  

## 2013-06-13 NOTE — Assessment & Plan Note (Addendum)
Sx not controlled on Protonix bid or Nexium qd. DIFFERENTIAL DIAGNOSIS INCLUDES H PYLORI GASTRITIS, REFRACTORY GERD, PUD, LESS LIKELY MALT LYMPHOMA, OR GASTRIC CA.  EGD/POSSIBLE BRAVO CAPSULE PLACEMENT ON BID PPI ON TUES. INFO AND IMAGE ON BRAVO CAPSULE GIVEN. LOW FAT DIET NEXIUM BID OPV IN 3 MOS

## 2013-06-14 NOTE — Progress Notes (Signed)
Reminder in epic °

## 2013-06-18 ENCOUNTER — Encounter (HOSPITAL_COMMUNITY): Payer: Self-pay | Admitting: *Deleted

## 2013-06-18 ENCOUNTER — Encounter (HOSPITAL_COMMUNITY)
Admission: RE | Disposition: A | Discharge status: Home / Self Care | Payer: Self-pay | Source: Ambulatory Visit | Attending: Gastroenterology

## 2013-06-18 ENCOUNTER — Ambulatory Visit (HOSPITAL_COMMUNITY)
Admission: RE | Admit: 2013-06-18 | Discharge: 2013-06-18 | Disposition: A | Discharge status: Home / Self Care | Payer: Medicare Other | Source: Ambulatory Visit | Attending: Gastroenterology | Admitting: Gastroenterology

## 2013-06-18 DIAGNOSIS — K219 Gastro-esophageal reflux disease without esophagitis: Secondary | ICD-10-CM | POA: Insufficient documentation

## 2013-06-18 DIAGNOSIS — I10 Essential (primary) hypertension: Secondary | ICD-10-CM | POA: Insufficient documentation

## 2013-06-18 DIAGNOSIS — Z87891 Personal history of nicotine dependence: Secondary | ICD-10-CM | POA: Insufficient documentation

## 2013-06-18 DIAGNOSIS — R1310 Dysphagia, unspecified: Secondary | ICD-10-CM

## 2013-06-18 DIAGNOSIS — K2960 Other gastritis without bleeding: Secondary | ICD-10-CM | POA: Insufficient documentation

## 2013-06-18 DIAGNOSIS — Z7982 Long term (current) use of aspirin: Secondary | ICD-10-CM | POA: Insufficient documentation

## 2013-06-18 DIAGNOSIS — Z79899 Other long term (current) drug therapy: Secondary | ICD-10-CM | POA: Insufficient documentation

## 2013-06-18 DIAGNOSIS — E119 Type 2 diabetes mellitus without complications: Secondary | ICD-10-CM | POA: Insufficient documentation

## 2013-06-18 DIAGNOSIS — R1013 Epigastric pain: Secondary | ICD-10-CM

## 2013-06-18 DIAGNOSIS — K3189 Other diseases of stomach and duodenum: Secondary | ICD-10-CM

## 2013-06-18 HISTORY — PX: ESOPHAGOGASTRODUODENOSCOPY: SHX5428

## 2013-06-18 HISTORY — PX: SAVORY DILATION: SHX5439

## 2013-06-18 HISTORY — PX: MALONEY DILATION: SHX5535

## 2013-06-18 HISTORY — PX: BRAVO PH STUDY: SHX5421

## 2013-06-18 LAB — GLUCOSE, CAPILLARY: Glucose-Capillary: 109 mg/dL — ABNORMAL HIGH (ref 70–99)

## 2013-06-18 SURGERY — EGD (ESOPHAGOGASTRODUODENOSCOPY)
Anesthesia: Moderate Sedation

## 2013-06-18 MED ORDER — MEPERIDINE HCL 100 MG/ML IJ SOLN
INTRAMUSCULAR | Status: AC
  Filled 2013-06-18: qty 2

## 2013-06-18 MED ORDER — STERILE WATER FOR IRRIGATION IR SOLN
Status: DC | PRN
  Administered 2013-06-18: 12:00:00

## 2013-06-18 MED ORDER — MIDAZOLAM HCL 5 MG/5ML IJ SOLN
INTRAMUSCULAR | Status: AC
  Filled 2013-06-18: qty 10

## 2013-06-18 MED ORDER — ESOMEPRAZOLE MAGNESIUM 40 MG PO CPDR: DELAYED_RELEASE_CAPSULE | ORAL | Status: DC

## 2013-06-18 MED ORDER — HYDRALAZINE HCL 20 MG/ML IJ SOLN
10.0000 mg | Freq: Once | INTRAMUSCULAR | Status: AC
  Administered 2013-06-18: 10 mg via INTRAVENOUS

## 2013-06-18 MED ORDER — HYDRALAZINE HCL 20 MG/ML IJ SOLN
INTRAMUSCULAR | Status: AC
  Filled 2013-06-18: qty 1

## 2013-06-18 MED ORDER — MIDAZOLAM HCL 5 MG/5ML IJ SOLN
INTRAMUSCULAR | Status: DC | PRN
  Administered 2013-06-18: 1 mg via INTRAVENOUS
  Administered 2013-06-18: 2 mg via INTRAVENOUS

## 2013-06-18 MED ORDER — MEPERIDINE HCL 100 MG/ML IJ SOLN
INTRAMUSCULAR | Status: DC | PRN
  Administered 2013-06-18: 25 mg

## 2013-06-18 MED ORDER — SODIUM CHLORIDE 0.9 % IV SOLN
INTRAVENOUS | Status: DC
  Administered 2013-06-18: 11:00:00 via INTRAVENOUS

## 2013-06-18 MED ORDER — LIDOCAINE VISCOUS 2 % MT SOLN
OROMUCOSAL | Status: DC | PRN
  Administered 2013-06-18: 1 via OROMUCOSAL

## 2013-06-18 MED ORDER — LIDOCAINE VISCOUS 2 % MT SOLN
OROMUCOSAL | Status: AC
  Filled 2013-06-18: qty 15

## 2013-06-18 NOTE — Op Note (Addendum)
[facility] [address] [place], [address]   ENDOSCOPY PROCEDURE REPORT  PATIENT: Charles, [name]  MR#: [id number] BIRTHDATE: 1955/11/12 , 63  yrs. old GENDER: Male  ENDOSCOPIST: Barney Drain, MD REFFERED [id number] Manuella Ghazi, M.D. PROCEDURE DATE:  06/18/2013 PROCEDURE:   EGD with biopsy and EGD with dilatation over guidewire   INDICATIONS:1.  dysphagia.   2.  dyspepsia.   3.  reflux symptoms despite therapy(NEXIUM BID). PT TAKES ASA AND PLAVIX. MEDICATIONS: Demerol 25 mg IV and Versed 2 mg IV TOPICAL ANESTHETIC: Viscous Xylocaine  DESCRIPTION OF PROCEDURE:   After the risks benefits and alternatives of the procedure were thoroughly explained, informed consent was obtained.  The [id number] ([id number])  endoscope was introduced through the mouth and advanced to the second portion of the duodenum. The instrument was slowly withdrawn as the mucosa was carefully examined.  Prior to withdrawal of the scope, the guidwire was placed.  The esophagus was dilated successfully.  The patient was recovered in endoscopy and discharged home in satisfactory condition.   ESOPHAGUS: PROBABLE PROXIMAL ESOPHAGEAL WEB.   BRAVO CAPSULE PLACED 33 CM FROM THE TEETH.  GE JXN 38 CM FROM THE TEETH.   STOMACH: Mild non-erosive gastritis (inflammation) was found in the gastric antrum and on the greater curvature of the gastric body.  Multiple biopsies were performed using cold forceps.   DUODENUM: The duodenal mucosa showed no abnormalities in the bulb and second portion of the duodenum.   Dilation was then performed at the proximal esophagus Dilator: Savary over guidewire Size(s): 14-16 MM Resistance: moderate Heme: yes  COMPLICATIONS: The PATIENT  HAD DIASTOLIC BP 151. HYDRALAZINE 10 MG IV GIVEN. D/C HOME WITH DBP < 90.  ENDOSCOPIC IMPRESSION: 1.   DYSPHAGIA LIKELY DUE TO PROBABLE PROXIMAL ESOPHAGEAL WEB. 2.   BRAVO CAPSULE PLACED TO EVALUATE REFRACTORY GERD. 3.   MILD Non-erosive  gastritis  RECOMMENDATIONS: CONTINUE NEXIUM.  TAKE 30 MINUTES BEFORE MEALS TWICE DAILY. RECORD EVERYTHING THAT GOES INTO YOUR MOUTH FOR THE NEXT 48 HOURS. RETURN THE RECORDER ON THUR AFTERNOON OR FRI MORNING. FOLLOW A LOW FAT DIET. BIOPSY RESULTS SHOULD BE BACK IN 7 DAYS.  FOLLOW UP IN JUL 2015.    _______________________________ eSignedBarney Drain, MD 06/18/2013 3:48 PMRevised: 06/18/2013 3:48 PM

## 2013-06-18 NOTE — Discharge Instructions (Signed)
You have mild gastritis most likely due to aspirin use. I biopsied your stomach. I STRETCHED YOUR ESOPHAGUS DUE TO YOUR PROBLEMS SWALLOWING. I PLACED A CAPSULE IN YOUR ESOPHAGUS.  IT WILL FALL OFF & PASS IN YOUR STOOL.     CONTINUE NEXIUM. TAKE 30 MINUTES BEFORE MEALS TWICE DAILY.  RECORD EVERYTHING THAT GOES INTO YOUR MOUTH FOR THE NEXT 48 HOURS.  RETURN THE RECORDER ON THUR AFTERNOON OR FRI MORNING..  FOLLOW A LOW FAT DIET. SEE INFO BELOW.  YOUR BIOPSY RESULTS SHOULD BE BACK IN 7 DAYS.  FOLLOW UP IN JUL 2015.    ENDOSCOPY Care After Read the instructions outlined below and refer to this sheet in the next week. These discharge instructions provide you with general information on caring for yourself after you leave the hospital. While your treatment has been planned according to the most current medical practices available, unavoidable complications occasionally occur. If you have any problems or questions after discharge, call DR. Marquee Fuchs, [phone].  ACTIVITY  You may resume your regular activity, but move at a slower pace for the next 24 hours.   Take frequent rest periods for the next 24 hours.   Walking will help get rid of the air and reduce the bloated feeling in your belly (abdomen).   No driving for 24 hours (because of the medicine (anesthesia) used during the test).   You may shower.   Do not sign any important legal documents or operate any machinery for 24 hours (because of the anesthesia used during the test).    NUTRITION  Drink plenty of fluids.   You may resume your normal diet as instructed by your doctor.   Begin with a light meal and progress to your normal diet. Heavy or fried foods are harder to digest and may make you feel sick to your stomach (nauseated).   Avoid alcoholic beverages for 24 hours or as instructed.    MEDICATIONS  You may resume your normal medications.   WHAT YOU CAN EXPECT TODAY  Some feelings of bloating in the abdomen.    Passage of more gas than usual.   Spotting of blood in your stool or on the toilet paper  .  IF YOU HAD POLYPS REMOVED DURING THE ENDOSCOPY:  Eat a soft diet IF YOU HAVE NAUSEA, BLOATING, ABDOMINAL PAIN, OR VOMITING.    FINDING OUT THE RESULTS OF YOUR TEST Not all test results are available during your visit. DR. Oneida Alar WILL CALL YOU WITHIN 7 DAYS OF YOUR PROCEDUE WITH YOUR RESULTS. Do not assume everything is normal if you have not heard from DR. Ivey Nembhard IN ONE WEEK, CALL HER OFFICE AT [phone].  SEEK IMMEDIATE MEDICAL ATTENTION AND CALL THE OFFICE: [phone] IF:  You have more than a spotting of blood in your stool.   Your belly is swollen (abdominal distention).   You are nauseated or vomiting.   You have a temperature over 101F.   You have abdominal pain or discomfort that is severe or gets worse throughout the day.   Lifestyle and home remedies  You may eliminate or reduce the frequency of heartburn by making the following lifestyle changes:   Control your weight. Being overweight is a major risk factor for heartburn and GERD. Excess pounds put pressure on your abdomen, pushing up your stomach and causing acid to back up into your esophagus.    Eat smaller meals. 4 TO 6 MEALS A DAY. This reduces pressure on the lower esophageal sphincter, helping to prevent  the valve from opening and acid from washing back into your esophagus.    Loosen your belt. Clothes that fit tightly around your waist put pressure on your abdomen and the lower esophageal sphincter.    Eliminate heartburn triggers. Everyone has specific triggers. Common triggers such as fatty or fried foods, spicy food, tomato sauce, carbonated beverages, alcohol, chocolate, mint, garlic, onion, caffeine and nicotine may make heartburn worse.    Avoid stooping or bending. Tying your shoes is OK. Bending over for longer periods to weed your garden isn't, especially soon after eating.    Don't lie down after a meal.  Wait at least three to four hours after eating before going to bed, and don't lie down right after eating.   Alternative medicine   Several home remedies exist for treating GERD, but they provide only temporary relief. They include drinking baking soda (sodium bicarbonate) added to water or drinking other fluids such as baking soda mixed with cream of tartar and water.   Although these liquids create temporary relief by neutralizing, washing away or buffering acids, eventually they aggravate the situation by adding gas and fluid to your stomach, increasing pressure and causing more acid reflux. Further, adding more sodium to your diet may increase your blood pressure and add stress to your heart, and excessive bicarbonate ingestion can alter the acid-base balance in your body.  Low-Fat Diet BREADS, CEREALS, PASTA, RICE, DRIED PEAS, AND BEANS These products are high in carbohydrates and most are low in fat. Therefore, they can be increased in the diet as substitutes for fatty foods. They too, however, contain calories and should not be eaten in excess. Cereals can be eaten for snacks as well as for breakfast.  Include foods that contain fiber (fruits, vegetables, whole grains, and legumes). Research shows that fiber may lower blood cholesterol levels, especially the water-soluble fiber found in fruits, vegetables, oat products, and legumes.  FRUITS AND VEGETABLES It is good to eat fruits and vegetables. Besides being sources of fiber, both are rich in vitamins and some minerals. They help you get the daily allowances of these nutrients. Fruits and vegetables can be used for snacks and desserts.  MEATS Limit lean meat, chicken, [place], and fish to no more than 6 ounces per day.  Beef, Pork, and Lamb Use lean cuts of beef, pork, and lamb. Lean cuts include:  Extra-lean ground beef.  Arm roast.  Sirloin tip.  Center-cut ham.  Round steak.  Loin chops.  Rump roast.  Tenderloin.  Trim all fat  off the outside of meats before cooking. It is not necessary to severely decrease the intake of red meat, but lean choices should be made. Lean meat is rich in protein and contains a highly absorbable form of iron. Premenopausal women, in particular, should avoid reducing lean red meat because this could increase the risk for low red blood cells (iron-deficiency anemia).  Chicken and [place] These are good sources of protein. The fat of poultry can be reduced by removing the skin and underlying fat layers before cooking. Chicken and [place] can be substituted for lean red meat in the diet. Poultry should not be fried or covered with high-fat sauces.  Fish and Shellfish Fish is a good source of protein. Shellfish contain cholesterol, but they usually are low in saturated fatty acids. The preparation of fish is important. Like chicken and [place], they should not be fried or covered with high-fat sauces.  EGGS Egg whites contain no fat or cholesterol. They  can be eaten often. Try 1 to 2 egg whites instead of whole eggs in recipes or use egg substitutes that do not contain yolk.  MILK AND DAIRY PRODUCTS Use skim or 1% milk instead of 2% or whole milk. Decrease whole milk, natural, and processed cheeses. Use nonfat or low-fat (2%) cottage cheese or low-fat cheeses made from vegetable oils. Choose nonfat or low-fat (1 to 2%) yogurt. Experiment with evaporated skim milk in recipes that call for heavy cream. Substitute low-fat yogurt or low-fat cottage cheese for sour cream in dips and salad dressings. Have at least 2 servings of low-fat dairy products, such as 2 glasses of skim (or 1%) milk each day to help get your daily calcium intake.  FATS AND OILS Reduce the total intake of fats, especially saturated fat. Butterfat, lard, and beef fats are high in saturated fat and cholesterol. These should be avoided as much as possible. Vegetable fats do not contain cholesterol, but certain vegetable fats, such as  coconut oil, palm oil, and palm kernel oil are very high in saturated fats. These should be limited. These fats are often used in bakery goods, processed foods, popcorn, oils, and nondairy creamers. Vegetable shortenings and some peanut butters contain hydrogenated oils, which are also saturated fats. Read the labels on these foods and check for saturated vegetable oils.  Unsaturated vegetable oils and fats do not raise blood cholesterol. However, they should be limited because they are fats and are high in calories. Total fat should still be limited to 30% of your daily caloric intake. Desirable liquid vegetable oils are corn oil, cottonseed oil, olive oil, canola oil, safflower oil, soybean oil, and sunflower oil. Peanut oil is not as good, but small amounts are acceptable. Buy a heart-healthy tub margarine that has no partially hydrogenated oils in the ingredients. Mayonnaise and salad dressings often are made from unsaturated fats, but they should also be limited because of their high calorie and fat content.  OTHER EATING TIPS Snacks  Most sweets should be limited as snacks. They tend to be rich in calories and fats, and their caloric content outweighs their nutritional value. Some good choices in snacks are graham crackers, melba toast, soda crackers, bagels (no egg), English muffins, fruits, and vegetables. These snacks are preferable to snack crackers, [place] fries, and chips. Popcorn should be air-popped or cooked in small amounts of liquid vegetable oil.  Desserts Eat fruit, low-fat yogurt, and fruit ices instead of pastries, cake, and cookies. Sherbet, angel food cake, gelatin dessert, frozen low-fat yogurt, or other frozen products that do not contain saturated fat (pure fruit juice bars, frozen ice pops) are also acceptable.   COOKING METHODS Choose those methods that use little or no fat. They include: Poaching.  Braising.  Steaming.  Grilling.  Baking.  Stir-frying.  Broiling.    Microwaving.  Foods can be cooked in a nonstick pan without added fat, or use a nonfat cooking spray in regular cookware. Limit fried foods and avoid frying in saturated fat. Add moisture to lean meats by using water, broth, cooking wines, and other nonfat or low-fat sauces along with the cooking methods mentioned above. Soups and stews should be chilled after cooking. The fat that forms on top after a few hours in the refrigerator should be skimmed off. When preparing meals, avoid using excess salt. Salt can contribute to raising blood pressure in some people . EATING AWAY FROM HOME Order entres, potatoes, and vegetables without sauces or butter. When meat exceeds the  size of a deck of cards (3 to 4 ounces), the rest can be taken home for another meal.  Choose vegetable or fruit salads and ask for low-calorie salad dressings to be served on the side. Use dressings sparingly. Limit high-fat toppings, such as bacon, crumbled eggs, cheese, sunflower seeds, and olives. Ask for heart-healthy tub margarine instead of butter.
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
## 2013-06-18 NOTE — Interval H&P Note (Signed)
History and Physical Interval Note:  06/18/2013 11:45 AM  Charles [name]  has presented today for surgery, with the diagnosis of DYSPEPSIA  AND DYSPHAGIA  The various methods of treatment have been discussed with the patient and family. After consideration of risks, benefits and other options for treatment, the patient has consented to  Procedure(s) with comments: ESOPHAGOGASTRODUODENOSCOPY (EGD) (N/A) - 12:30-moved to [address] to notify pt SAVORY DILATION (N/A) MALONEY DILATION (N/A) BRAVO [place] (N/A) as a surgical intervention .  The patient's history has been reviewed, patient examined, no change in status, stable for surgery.  I have reviewed the patient's chart and labs.  Questions were answered to the patient's satisfaction.     Frannie Shedrick L Jerri Glauser

## 2013-06-18 NOTE — Op Note (Signed)
Pt blood pressure elevated during procedure and after the procedure it was still elevated. Dr. Oneida Alar ordered 10 of hydralazine given 12:52.

## 2013-06-19 ENCOUNTER — Encounter: Payer: Self-pay | Admitting: Neurology

## 2013-06-19 ENCOUNTER — Ambulatory Visit (INDEPENDENT_AMBULATORY_CARE_PROVIDER_SITE_OTHER): Payer: Medicare Other | Admitting: Neurology

## 2013-06-19 ENCOUNTER — Telehealth: Payer: Self-pay | Admitting: Internal Medicine

## 2013-06-19 ENCOUNTER — Encounter (INDEPENDENT_AMBULATORY_CARE_PROVIDER_SITE_OTHER): Payer: Self-pay

## 2013-06-19 VITALS — BP 151/90 | HR 94

## 2013-06-19 DIAGNOSIS — G8110 Spastic hemiplegia affecting unspecified side: Secondary | ICD-10-CM

## 2013-06-19 DIAGNOSIS — R4189 Other symptoms and signs involving cognitive functions and awareness: Secondary | ICD-10-CM

## 2013-06-19 MED ORDER — ONABOTULINUMTOXINA 100 UNITS IJ SOLR: 300.0000 [IU] | Freq: Once | INTRAMUSCULAR | Status: DC

## 2013-06-19 MED ORDER — FLUTICASONE FUROATE-VILANTEROL 100-25 MCG/INH IN AEPB
1.0000 | INHALATION_SPRAY | Freq: Every morning | RESPIRATORY_TRACT | Status: DC
Start: 2013-06-19 — End: 2014-05-13

## 2013-06-19 NOTE — Telephone Encounter (Signed)
Rx has been sent in. Pt's brother is aware.
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
## 2013-06-20 ENCOUNTER — Encounter (HOSPITAL_COMMUNITY): Payer: Self-pay | Admitting: Gastroenterology

## 2013-06-21 NOTE — Progress Notes (Signed)
Quick Note:  I called and LMVM for pt on cell that lab results so far normal. He can call back if questions. ______

## 2013-06-24 LAB — METHYLMALONIC ACID, SERUM: METHYLMALONIC ACID: 107 nmol/L (ref 0–378)

## 2013-06-24 LAB — VITAMIN B1, WHOLE BLOOD: Thiamine: 100.5 nmol/L (ref 66.5–200.0)

## 2013-06-24 LAB — TSH: TSH: 3.56 u[IU]/mL (ref 0.450–4.500)

## 2013-06-24 LAB — VITAMIN B12: Vitamin B-12: 454 pg/mL (ref 211–946)

## 2013-06-27 NOTE — Telephone Encounter (Signed)
Closing encounter

## 2013-06-29 ENCOUNTER — Encounter: Payer: Self-pay | Admitting: Internal Medicine

## 2013-06-29 ENCOUNTER — Telehealth: Payer: Self-pay | Admitting: Gastroenterology

## 2013-06-29 DIAGNOSIS — K2960 Other gastritis without bleeding: Secondary | ICD-10-CM

## 2013-06-29 NOTE — Telephone Encounter (Signed)
Please call pt VIA INTERPRETER. His stomach Bx shows GRANULOMATOUS gastritis. 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 HE NEEDS TO SEE A RHEUMATOLOGY DOCTOR TO DISCUSS ANY CHANGES IN HIS THERAPY FOR HIS SARCOID. THE pH STUDY SHOWS NEXIUM TWICE DAILY CONTROLS THE ACID IN HIS STOMACH. HE HAD VERY FEW EPISODE OF REGURGITATION RECORDED IN THE 2 DAYS THE STUDY WAS PERFORMED.  CALL IN ONE MONTH IF HE STILL HAS PROBLEMS SWALLOWING. OPV IN JUL 2015 E30 DYSPHAGIA, GRANULOMATOUS GASTRITIS

## 2013-06-29 NOTE — Procedures (Signed)
  POST-OPERATIVE DIAGNOSIS:  GERD CONTROLLED  PROCEDURE:  Procedure(s): BRAVO PH STUDY ON NEXIUM BID  SURGEON:  Surgeon(s): Dorothyann Peng, MD  FINDINGS:  PT HAD RECORDER FOR 1 DAY AND 23 HOURS 23 MINS   FRACTION Ph TOTAL:  DAY 1 0.5  DAY 2 0   # OF REFLUXES:    DAY 1 13  DAY 2 1   LONG REFLUX > 5:   DAY 1 0 DAY 2 0    LONGEST REFLUX:   DAY 1 1 DAY 2 0   REFLUX TABLE: UPRIGHT 45 EPISODES, SUPINE 21, POSTPRANDIAL 12  DEMEESTER SCORE DAY 1:  2.1 (NL < 14.72)  DEMEESTER SCORE DAY 2:  0.4 SAP TABLE: ACID REFLUX ANALYSIS: 97.7 % REGURGITATION, 99.9% DRINK  DIAGNOSIS: GERD CONTROLLED  PLAN: 1. LOW FAT DIET 2. CONTINUE NEXIUM BID. 3. OPV IN AUG 2015 E:30 VISIT.

## 2013-07-01 NOTE — Telephone Encounter (Signed)
Per [facility] pt has OV with [facility] on 7/8 at 130 for follow up

## 2013-07-01 NOTE — Telephone Encounter (Signed)
I called and informed pt's mom, Elior Robinette. She said pt has appt with Dr. Soyla Murphy in [place], and she is not sure what kind of doctor that is. She said that Dr. Manuella Ghazi made a referral to rheumatology and a diabetic doctor.  She would like a print out of this information and I am putting it in the mail to her.

## 2013-07-23 ENCOUNTER — Encounter: Payer: Self-pay | Admitting: Cardiology

## 2013-07-23 ENCOUNTER — Ambulatory Visit (INDEPENDENT_AMBULATORY_CARE_PROVIDER_SITE_OTHER): Payer: Medicare Other | Admitting: Cardiology

## 2013-07-23 VITALS — BP 151/91 | HR 74 | Ht 64.0 in | Wt 143.1 lb

## 2013-07-23 DIAGNOSIS — R079 Chest pain, unspecified: Secondary | ICD-10-CM

## 2013-07-23 DIAGNOSIS — R0609 Other forms of dyspnea: Secondary | ICD-10-CM

## 2013-07-23 DIAGNOSIS — D869 Sarcoidosis, unspecified: Secondary | ICD-10-CM

## 2013-07-23 DIAGNOSIS — R0789 Other chest pain: Secondary | ICD-10-CM

## 2013-07-23 DIAGNOSIS — I639 Cerebral infarction, unspecified: Secondary | ICD-10-CM

## 2013-07-23 DIAGNOSIS — I10 Essential (primary) hypertension: Secondary | ICD-10-CM

## 2013-07-23 DIAGNOSIS — R0989 Other specified symptoms and signs involving the circulatory and respiratory systems: Secondary | ICD-10-CM

## 2013-07-23 DIAGNOSIS — I6350 Cerebral infarction due to unspecified occlusion or stenosis of unspecified cerebral artery: Secondary | ICD-10-CM

## 2013-07-23 DIAGNOSIS — R0600 Dyspnea, unspecified: Secondary | ICD-10-CM

## 2013-07-23 NOTE — Assessment & Plan Note (Signed)
Description of symptoms most consistent with GI etiology. He reports improvement in feeling rawness in his chest when he drinks water, also recent dysphasia, some shortness of breath which could be reflux with aspiration. 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 He had a reassuring cardiac ischemic workup in August 2014, normal LVEF overall as well. ECG shows LVH with no other acute findings. At this point no further cardiac testing recommended, although would agree that he needs to have formal evaluation by a rheumatologist and determine best treatment course for his sarcoidosis. I discussed this with the patient and his family.

## 2013-07-23 NOTE — Assessment & Plan Note (Signed)
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 His had Botox injections for spastic hemiplegia.

## 2013-07-23 NOTE — Patient Instructions (Signed)
Your physician recommends that you schedule a follow-up appointment in: as needed Your physician recommends that you continue on your current medications as directed. Please refer to the Current Medication list given to you today.   

## 2013-07-23 NOTE — Assessment & Plan Note (Signed)
Blood pressure elevated today. LVH by ECG. Medications include ARB. Keep followup with Dr. Manuella Ghazi.
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
## 2013-07-23 NOTE — Assessment & Plan Note (Signed)
Agree that he needs referral to rheumatologist.

## 2013-07-23 NOTE — Assessment & Plan Note (Signed)
Followed by Dr [name] 

## 2013-08-07 ENCOUNTER — Ambulatory Visit: Payer: Medicare Other | Admitting: Internal Medicine

## 2013-08-08 ENCOUNTER — Ambulatory Visit (INDEPENDENT_AMBULATORY_CARE_PROVIDER_SITE_OTHER): Payer: Medicare Other | Admitting: Internal Medicine

## 2013-08-08 ENCOUNTER — Encounter: Payer: Self-pay | Admitting: Internal Medicine

## 2013-08-08 VITALS — BP 170/90 | HR 68

## 2013-08-08 DIAGNOSIS — R0989 Other specified symptoms and signs involving the circulatory and respiratory systems: Secondary | ICD-10-CM | POA: Diagnosis not present

## 2013-08-08 DIAGNOSIS — I10 Essential (primary) hypertension: Secondary | ICD-10-CM

## 2013-08-08 DIAGNOSIS — I6350 Cerebral infarction due to unspecified occlusion or stenosis of unspecified cerebral artery: Secondary | ICD-10-CM

## 2013-08-08 DIAGNOSIS — R0600 Dyspnea, unspecified: Secondary | ICD-10-CM

## 2013-08-08 DIAGNOSIS — D869 Sarcoidosis, unspecified: Secondary | ICD-10-CM | POA: Diagnosis not present

## 2013-08-08 DIAGNOSIS — R0609 Other forms of dyspnea: Secondary | ICD-10-CM

## 2013-08-08 MED ORDER — OLMESARTAN MEDOXOMIL 20 MG PO TABS: 20.0000 mg | ORAL_TABLET | Freq: Every day | ORAL | Status: DC

## 2013-08-08 NOTE — Progress Notes (Signed)
Subjective:    Patient ID: Charles [name], male    DOB: 1956/01/18    MRN: [id number]  Brief patient profile:  58 yowm dx quit smoking in 1994 first aware of sob age 58 referred to pulmonary clinic 10/11/12 with dx of sarcoid and atypical sob ? acei effect    History of Present Illness   Deaf [name] -Interpretor  Hx of CVA w/ hemiplegia   10/11/2012  1st [facility] pulmonary eval cc short of breath ? When did you first notice it ?  Mother "well the doctor's discovered" it dx with sarcoid in [place] and placed on prednisone since decades and maintained on prednisone and prinizil.  He was able to play sports in high school but mother says ?always needed advair (note advair wasn't made until he was 65) "well he was always on some pump" (interesting comment as he showed no capacity at all to use hfa).  Thru the interpreter after mutliple back and forths and asking the mother to let him answer the question, he has been variably sob since age 26, sometimes can't get across the room s giving out but never at rest, never sleeping, assoc with dry cough ? Since when  > on ACEi  Dx and rx as sarcoid by [name] doc on pred 10 mg per day but not clear whether ever changed his breathing for the better and so maint on pred 10 mg per day  >>D/C acei and advair    10/25/2012 Follow up and Med review (Interpretor and family present for visit)  Patient returns for a two-week followup and medication review. We reviewed all his medications and organized them into medication calendar with patient education. ACE and Advair stopped last ov . No flare in cough or dyspnea. Uses ProAir most days in morning.  Remains on prednisone 10mg  daily  Appears to be taking meds correctly . Brother gives him his meds.   rec Follow med calendar closely and bring to each visit.  Use ProAir Inhaler only as needed for wheezing /shortness of breath    11/19/2012 f/u ov/[name] re: sarcoid/asthma/copd  On pred 10 mg daily did not  bring calendar not apparently using it Chief Complaint  Patient presents with  . Follow-up    pt reports breathing is about the same since last OV, breathing is "different and harder " per mother patients has increased wheezing --  will be having surgery soon for tooth extraction  sob x 75 ft per brother Brother confused with med instructions re saba  despite calendar, says doe x across the room every time he tries. Noct spells of choking ever since stroke but swallowing ok. rec Try reducing prednisone to one half daily  Add pepcid 20 mg one at bedtime Add Breo one puff each am See calendar for specific medication   03/04/2013 f/u ov/[name] re: sob / did not bring med calenar or all meds  Chief Complaint  Patient presents with  . Follow-up    went to ED 02/22/13 for SOB. C/o nasal congestion, breathing feels limited, clears throat frequently, PND left side.  Fm very confused with meds brother "you gave him spiriva" (no indication we did)  Pt thru interpreter says doing much better p proair but shows very little capacity to use the saba delivery methold and does not have neb.  rec Add Breo one puff each am.(you have 28 days of samples at this dose)  And continue spiriva one capsule each am for now Continue prednsine  10 mg one daily for now   . 04/01/2013 f/u ov/[name] re: asthma/ sarcoid  Chief Complaint  Patient presents with  . Follow-up    Pt c/o food getting stuck in throat.  Denies cough, SOB.    Once a week feels food gets stuck in neck, better if swallows water. Not needing saba any more, still very poor mdi/dpi (see a/p) rec Keep working on perfecting inhaler technique - think about it like taking a drag off a cigarette. Try lowering prednisone dose to 10 mg one half daily  .05/13/2013 f/u ov/[name] re: sarcoid/ pred down to 5 mg per day / ? ab Chief Complaint  Patient presents with  . Follow-up    Pt still c/o diff with swallowing.   hip stops before the breathing  - [name]  fu  Best breathing he's had in years Some dry mouth and dysphagia rec Try off spiriva to see if your swallowing and stomach feel better and your breathing stays the same without the spiriva, and if so you don't need the spiriva  Please schedule a follow up visit in 3 months but call sooner if needed with cxr on return   08/08/2013 f/u ov/[name] re: dysphagia no better p stretching, better if uses water  Chief Complaint  Patient presents with  . Follow-up    Pt states that breahting has improved since last OV. Pt c/o increased SOB after eating--difficulty catching breath "gasping" per family members.  sob uses saba every day or two otherwise good control with Breo one daily   No obvious day to day or daytime variabilty or assoc chronic cough or cp or chest tightness, subjective wheeze overt sinus or hb symptoms. No unusual exp hx or h/o childhood pna/ asthma or knowledge of premature birth.    Also denies any obvious fluctuation of symptoms with weather or environmental changes or other aggravating or alleviating factors except as outlined above   Current Medications, Allergies, Complete Past Medical History, Past Surgical History, Family History, and Social History were reviewed in [employer] record.  ROS  The following are not active complaints unless bolded sore throat, dysphagia, dental problems, itching, sneezing,  nasal congestion or excess/ purulent secretions, ear ache,   fever, chills, sweats, unintended wt loss, pleuritic or exertional cp, hemoptysis,  orthopnea pnd or leg swelling, presyncope, palpitations, heartburn, abdominal pain, anorexia, nausea, vomiting, diarrhea  or change in bowel or urinary habits, change in stools or urine, dysuria,hematuria,  rash, arthralgias, visual complaints, headache, numbness weakness or ataxia or problems with walking or coordination,  change in mood/affect or memory.              Objective:   Physical Exam  W/c bound  bm  Deaf   08/08/2013        170 Wt Readings from Last 3 Encounters:  05/13/13 175 lb (79.379 kg)  04/01/13 176 lb (79.833 kg)  03/04/13 176 lb 12.8 oz (80.196 kg)      Interpretor in room   HEENT: nl dentition, turbinates, and orophanx. Nl external ear canals without cough reflex   NECK :  without JVD/Nodes/TM/ nl carotid upstrokes bilaterally   LUNGS: no acc muscle use, clear to A and P bilaterally without cough on insp or exp maneuvers   CV:  RRR  no s3 or murmur or increase in P2, no edema   ABD:  soft and nontender with nl excursion in the supine position. No bruits or organomegaly, bowel sounds nl  MS:  warm without deformities, calf tenderness, cyanosis or clubbing  SKIN: warm and dry without lesions      CXR  02/22/13 Chronic changes in the chest consistent with history of sarcoidosis.  No acute process demonstrated.      Assessment & Plan:

## 2013-08-08 NOTE — Patient Instructions (Addendum)
Benicar 40  mg one-half  daily in place of losartan to see if the throat problem improves  Please schedule a follow up office visit in 4 weeks, sooner if needed to see Tammy NP to recheck your swallowing and you blood pressure on benicar    .

## 2013-08-09 NOTE — Assessment & Plan Note (Addendum)
Try off acei 10/12/2012 > cozaar  For reasons that may related to vascular permability and nitric oxide pathways but not elevated  bradykinin levels (as seen with  ACEi use) losartan in the generic form has been reported now from mulitple sources  to cause a similar pattern of non-specific  upper airway symptoms as seen with acei.   This has not been reported with exposure to the other ARB's to date, so it seems reasonable for now to try either generic diovan or avapro if ARB needed or use an alternative class altogether.  See:  Lelon Frohlich Allergy Asthma Immunol  2008: 101: p [phone]    Try change to benicar to see effect on upper airway. Benicar 40  mg one-half  daily

## 2013-08-09 NOTE — Assessment & Plan Note (Signed)
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  For now therefore I recommend the patient maintain  5 mg daily unless needed for some other organ involvement

## 2013-08-09 NOTE — Assessment & Plan Note (Signed)
-   ex intol since age 58 - trial off acei 10/12/2012  -11/19/2012   Walked RA x one lap @ 185 stopped due to  Legs gave out no desat  - Spirometry 11/19/2012  FEV1  1.29 (49) ratio 55  - trial of breo samples 11/19/2012 >> improved on breo/spiriva 04/01/2013 > try off spiriva 05/14/2013 due to dry mouth> no worse  But still having non-specific upper airway symptoms > try off cozar trial basis    Each maintenance medication was reviewed in detail including most importantly the difference between maintenance and as needed and under what circumstances the prns are to be used. This was done in the context of a medication calendar review which provided the patient with a user-friendly unambiguous mechanism for medication administration and reconciliation and provides an action plan for all active problems. It is critical that this be shown to every doctor  for modification during the office visit if necessary so the patient can use it as a working document.

## 2013-08-28 ENCOUNTER — Ambulatory Visit (INDEPENDENT_AMBULATORY_CARE_PROVIDER_SITE_OTHER): Payer: Medicare Other | Admitting: Gastroenterology

## 2013-08-28 ENCOUNTER — Encounter (INDEPENDENT_AMBULATORY_CARE_PROVIDER_SITE_OTHER): Payer: Self-pay

## 2013-08-28 ENCOUNTER — Encounter: Payer: Self-pay | Admitting: Gastroenterology

## 2013-08-28 VITALS — BP 142/90 | HR 89 | Temp 97.2°F | Resp 20 | Ht 64.0 in | Wt 170.0 lb

## 2013-08-28 DIAGNOSIS — R1013 Epigastric pain: Secondary | ICD-10-CM

## 2013-08-28 DIAGNOSIS — K2940 Chronic atrophic gastritis without bleeding: Secondary | ICD-10-CM

## 2013-08-28 DIAGNOSIS — K3189 Other diseases of stomach and duodenum: Secondary | ICD-10-CM

## 2013-08-28 DIAGNOSIS — R1310 Dysphagia, unspecified: Secondary | ICD-10-CM | POA: Diagnosis not present

## 2013-08-28 DIAGNOSIS — K2960 Other gastritis without bleeding: Secondary | ICD-10-CM

## 2013-08-28 DIAGNOSIS — I6350 Cerebral infarction due to unspecified occlusion or stenosis of unspecified cerebral artery: Secondary | ICD-10-CM

## 2013-08-28 NOTE — Progress Notes (Signed)
Primary Care Physician: [name], MD  Primary Gastroenterologist:  Barney Drain, MD   Chief Complaint  Patient presents with  . Follow-up    HPI: Charles [name] is a 58 y.o. male here for f/u. EGD in 05/2013 for dyspepsia, dysphagia. Bravo placed at that time as well. He presents with his sign language interpreter today. Dr. Oneida Alar felt that his abdominal pain was related to sarcoidosis and that acid reflux is adequately controlled. She recommended followup with rheumatologist regarding sarcoidosis management. Patient has an upcoming appointment in September with Dr. Estanislado Pandy. He reports his swallowing is actually a lot better. Denies any problem swallowing pills or solid foods. Denies any heartburn. Abdominal pain much better as well. Aggravated if he eats greasy foods. Bowel movements regular. No blood in the stool or melena. Weight down 3 pounds since last visit. However up 4 pounds from one year ago.   . 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   Azzie Almas dilation N/A 06/18/2013    Procedure: SAVORY DILATION;  Surgeon: Danie Binder, MD;  [facility];  Service: Endoscopy;  Laterality: N/A;  Venia Minks dilation N/A 06/18/2013    Procedure: Venia Minks DILATION;  Surgeon: Danie Binder, MD;  [facility];  Service: Endoscopy;  Laterality: N/A;  . Bravo ph study N/A 06/18/2013    pH STUDY SHOWS NEXIUM TWICE DAILY CONTROLS THE ACID IN HIS STOMACH. HE HAD VERY FEWEPISODE OF REGURGITATION RECORDED IN THE 2 DAYS THE STUDY WAS PERFORMED.      Current Outpatient Prescriptions  Medication Sig Dispense Refill  . acetaminophen (TYLENOL) 500 MG tablet Take 1,000 mg by mouth  every 8 (eight) hours as needed for pain. Per bottle as needed for pain      . albuterol (PROVENTIL HFA;VENTOLIN HFA) 108 (90 BASE) MCG/ACT inhaler Inhale 2 puffs into the lungs every 4 (four) hours as needed for wheezing or shortness of breath.  1 Inhaler  3  . aspirin 325 MG EC tablet Take 325 mg by mouth daily.      [name] atorvastatin (LIPITOR) 10 MG tablet Take 10 mg by mouth daily. (cholesterol)      . baclofen (LIORESAL) 10 MG tablet Take 10 mg by mouth 2 (two) times daily. (muscle spasm)      . budesonide-formoterol (SYMBICORT) 80-4.5 MCG/ACT inhaler Inhale 1 puff into the lungs. 1-2 X day      . clopidogrel (PLAVIX) 75 MG tablet Take 75 mg by mouth every morning. (stroke prevention)      . esomeprazole (NEXIUM) 40 MG capsule 1 po 30 mins prior to meals bid  60 capsule  11  . Fluticasone Furoate-Vilanterol (BREO ELLIPTA) 100-25 MCG/INH AEPB Inhale 1 puff into the lungs every morning.  60 each  5  . losartan (COZAAR) 50 MG tablet Take 1 tablet (50 mg total) by mouth daily.  30 tablet  11  . olmesartan (BENICAR) 20 MG tablet Take 1 tablet (20 mg total) by mouth daily.  30 tablet  11  . polyethylene glycol powder (GLYCOLAX/MIRALAX) powder Take 17 g by mouth as needed.       . predniSONE (DELTASONE) 10 MG tablet Take 5 mg by mouth every morning.       [name]  sitaGLIPtin-metformin (JANUMET) 50-500 MG per tablet Take 1 tablet by mouth 2 (two) times daily with a meal.       Current Facility-Administered Medications  Medication Dose Route Frequency Provider Last Rate Last Dose  . botulinum toxin Type A (BOTOX) injection 300 Units  300 Units Intramuscular Once Hulen Luster, DO      . botulinum toxin Type A (BOTOX) injection 300 Units  300 Units Intramuscular Once Hulen Luster, DO      . botulinum toxin Type A (BOTOX) injection 300 Units  300 Units Intramuscular Once Hulen Luster, DO        Allergies as of 08/28/2013 - Review Complete 08/28/2013  Allergen Reaction Noted  . Contrast  media [iodinated diagnostic agents] Itching 10/02/2012  . Shellfish allergy Hives and Swelling 10/02/2012    ROS:  General: Negative for anorexia, weight loss, fever, chills, fatigue, weakness. ENT: Negative for hoarseness, difficulty swallowing , nasal congestion. CV: Negative for chest pain, angina, palpitations, dyspnea on exertion, peripheral edema.  Respiratory: Negative for dyspnea at rest, dyspnea on exertion, cough, sputum, wheezing.  GI: See history of present illness. GU:  Negative for dysuria, hematuria, urinary incontinence, urinary frequency, nocturnal urination.  Endo: Negative for unusual weight change.    Physical Examination:   BP 142/90  Pulse 89  Temp(Src) 97.2 F (36.2 C) (Oral)  Resp 20  Ht 5\' 4"  (1.626 m)  Wt 170 lb (77.111 kg)  BMI 29.17 kg/m2  General: Well-nourished, well-developed in no acute distress.  Eyes: No icterus. Mouth: Oropharyngeal mucosa moist and pink , no lesions erythema or exudate. Lungs: Clear to auscultation bilaterally.  Heart: Regular rate and rhythm, no murmurs rubs or gallops.  Abdomen: Bowel sounds are normal, mild epigastric tenderness, nondistended, no hepatosplenomegaly or masses, no abdominal bruits or hernia , no rebound or guarding.   Extremities: No lower extremity edema. No clubbing or deformities. Neuro: Alert and oriented x 4   Skin: Warm and dry, no jaundice.   Psych: Alert and cooperative, normal mood and affect.

## 2013-08-28 NOTE — Patient Instructions (Signed)
1. I will discuss with Dr. Oneida Alar whether we should pursue CAT scan of your abdomen to evaluate your sarcoidosis/abdominal pain further. 2. My office will call Dr. Arlean Hopping office to see if they can move your appointment up. 3. Continue Nexium as before.

## 2013-08-29 ENCOUNTER — Telehealth: Payer: Self-pay | Admitting: *Deleted

## 2013-08-29 NOTE — Telephone Encounter (Signed)
I requested labs from Dr. Manuella Ghazi.

## 2013-09-02 NOTE — Progress Notes (Signed)
Please let patient know that we have called Charles [name]) Dr. Arlean Hopping office and they currently have him on a cancellation list to try to get him in sooner. If they don't hear anything, they should keep already scheduled appt in 10/2013.

## 2013-09-02 NOTE — Assessment & Plan Note (Signed)
Abdominal pain improved as long as he avoids greasy foods. 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 Chronic prednisone therapy. Agree with Dr. Oneida Alar impression the patient should see a rheumatologist regarding his sarcoidosis to consider further management. His upcoming appointment is in September, will have office staff see effect be moved up. His mother is now very anxious about having this done even though today she had an occluded he should be seen a rheumatologist. Reassured her no significant urgency here.  We'll discuss further Dr. Oneida Alar, patient may benefit from abdominal imaging to determine extent of sarcoidosis.

## 2013-09-02 NOTE — Progress Notes (Signed)
cc'd to pcp 

## 2013-09-02 NOTE — Assessment & Plan Note (Signed)
Resolved status post dilation.

## 2013-09-03 NOTE — Progress Notes (Signed)
REVIEWED. CT ABD/PELVIS W/ ORAL & IV CONTRAST TO COMPLETE EVALUATION OF ABDOMINAL PAIN.

## 2013-09-05 ENCOUNTER — Ambulatory Visit (INDEPENDENT_AMBULATORY_CARE_PROVIDER_SITE_OTHER): Payer: Medicare Other | Admitting: Adult Health

## 2013-09-05 ENCOUNTER — Encounter: Payer: Self-pay | Admitting: Adult Health

## 2013-09-05 VITALS — BP 140/76 | HR 72 | Temp 97.7°F | Ht 64.0 in | Wt 172.4 lb

## 2013-09-05 DIAGNOSIS — I10 Essential (primary) hypertension: Secondary | ICD-10-CM | POA: Diagnosis not present

## 2013-09-05 DIAGNOSIS — I6350 Cerebral infarction due to unspecified occlusion or stenosis of unspecified cerebral artery: Secondary | ICD-10-CM

## 2013-09-05 DIAGNOSIS — D869 Sarcoidosis, unspecified: Secondary | ICD-10-CM

## 2013-09-05 MED ORDER — OLMESARTAN MEDOXOMIL 20 MG PO TABS: 20.0000 mg | ORAL_TABLET | Freq: Every day | ORAL | Status: DC

## 2013-09-05 NOTE — Patient Instructions (Signed)
Continue on current  Regimen  follow up Dr. Melvyn Novas  In 6 weeks and As needed

## 2013-09-08 NOTE — Progress Notes (Signed)
Subjective:    Patient ID: Charles [name], male    DOB: 1955-09-22    MRN: [id number]  Brief patient profile:  58 yowm dx quit smoking in 1994 first aware of sob age 58 referred to pulmonary clinic 10/11/12 with dx of sarcoid and atypical sob ? acei effect    History of Present Illness   [name] -Interpretor  Hx of CVA w/ hemiplegia   10/11/2012  1st [facility] pulmonary eval cc 58 short of breath ? When did you first notice it ?  Mother "well the doctor's discovered" it dx with sarcoid in [place] and placed on prednisone since decades and maintained on prednisone and prinizil.  He was able to play sports in high school but mother says ?always needed advair (note advair wasn't made until he was 58) "well he was always on some pump" (interesting comment as he showed no capacity at all to use hfa).  Thru the interpreter after mutliple back and forths and asking the mother to let him answer the question, he has been variably sob since age 58, sometimes can't get across the room s giving out but never at rest, never sleeping, assoc with dry cough ? Since when  > on ACEi  Dx and rx as sarcoid by Charles [name] pulmonary doc on pred 10 mg per day but not clear whether ever changed his breathing for the better and so maint on pred 10 mg per day  >>D/C acei and advair    10/25/2012 Follow up and Med review (Interpretor and family present for visit)  Patient returns for a two-week followup and medication review. We reviewed all his medications and organized them into medication calendar with patient education. ACE and Advair stopped last ov . No flare in cough or dyspnea. Uses ProAir most days in morning.  Remains on prednisone 10mg  daily  Appears to be taking meds correctly . Brother gives him his meds.   rec Follow med calendar closely and bring to each visit.  Use ProAir Inhaler only as needed for wheezing /shortness of breath    11/19/2012 f/u ov/Charles [name] re: sarcoid/asthma/copd  On pred 10 mg daily did not  bring calendar not apparently using it Chief Complaint  Patient presents with  . Follow-up    pt reports breathing is about the same since last OV, breathing is "different and harder " per mother patients has increased wheezing --  will be having surgery soon for tooth extraction  sob x 75 ft per brother Brother confused with med instructions re saba  despite calendar, says doe x across the room every time he tries. Noct spells of choking ever since stroke but swallowing ok. rec Try reducing prednisone to one half daily  Add pepcid 20 mg one at bedtime Add Breo one puff each am See calendar for specific medication   03/04/2013 f/u ov/Charles [name] re: sob / did not bring med calenar or all meds  Chief Complaint  Patient presents with  . Follow-up    went to ED 02/22/13 for SOB. C/o nasal congestion, breathing feels limited, clears throat frequently, PND left side.  Fm very confused with meds brother "you gave him spiriva" (no indication we did)  Pt thru interpreter says doing much better p proair but shows very little capacity to use the saba delivery methold and does not have neb.  rec Add Breo one puff each am.(you have 28 days of samples at this dose)  And continue spiriva one capsule each am for now Continue prednsine  10 mg one daily for now   . 04/01/2013 f/u ov/Charles [name] re: asthma/ sarcoid  Chief Complaint  Patient presents with  . Follow-up    Pt c/o food getting stuck in throat.  Denies cough, SOB.    Once a week feels food gets stuck in neck, better if swallows water. Not needing saba any more, still very poor mdi/dpi (see a/p) rec Keep working on perfecting inhaler technique - think about it like taking a drag off a cigarette. Try lowering prednisone dose to 10 mg one half daily  .05/13/2013 f/u ov/Charles [name] re: sarcoid/ pred down to 5 mg per day / ? ab Chief Complaint  Patient presents with  . Follow-up    Pt still c/o diff with swallowing.   hip stops before the breathing  - [name]  fu  Best breathing he's had in years Some dry mouth and dysphagia rec Try off spiriva to see if your swallowing and stomach feel better and your breathing stays the same without the spiriva, and if so you don't need the spiriva  Please schedule a follow up visit in 3 months but call sooner if needed with cxr on return   08/08/2013 f/u ov/Charles [name] re: dysphagia no better p stretching, better if uses water  Chief Complaint  Patient presents with  . Follow-up    Pt states that breahting has improved since last OV. Pt c/o increased SOB after eating--difficulty catching breath "gasping" per family members.  sob uses saba every day or two otherwise good control with Breo one daily  >>change losartan to benicar   09/05/13  Follow up (sarcoid/asthma/copd ) MW pt here for 4 week HTN follow up  Reports breathing is improved since last ov, Benicar working well for pt.   No new complaints. Last month was changed from losartan to benicar d/t potential for upper airway irritation .  Says his throat and breathing has improved.  Says tolerating benicar well w/ ok b/p .  Visit w/ intrepreter.     Current Medications, Allergies, Complete Past Medical History, Past Surgical History, Family History, and Social History were reviewed in [employer] record.  ROS  The following are not active complaints unless bolded sore throat, dental problems, itching, sneezing,  nasal congestion or excess/ purulent secretions, ear ache,   fever, chills, sweats, unintended wt loss, pleuritic or exertional cp, hemoptysis,  orthopnea pnd or leg swelling, presyncope, palpitations, heartburn, abdominal pain, anorexia, nausea, vomiting, diarrhea  or change in bowel or urinary habits, change in stools or urine, dysuria,hematuria,  rash, arthralgias, visual complaints, headache, numbness  or coordination,  change in mood/affect or memory.              Objective:   Physical Exam  W/c bound bm  Deaf    08/08/2013        170> 172 7/16   Interpretor in room   HEENT: nl dentition, turbinates, and orophanx. Nl external ear canals without cough reflex   NECK :  without JVD/Nodes/TM/ nl carotid upstrokes bilaterally   LUNGS: no acc muscle use, clear to A and P bilaterally without cough on insp or exp maneuvers   CV:  RRR  no s3 or murmur or increase in P2, no edema   ABD:  soft and nontender with nl excursion in the supine position. No bruits or organomegaly, bowel sounds nl  MS:  warm without deformities, calf tenderness, cyanosis or clubbing  SKIN: warm and dry without lesions  CXR  02/22/13 Chronic changes in the chest consistent with history of sarcoidosis.  No acute process demonstrated.      Assessment & Plan:

## 2013-09-08 NOTE — Assessment & Plan Note (Addendum)
Controlled on benicar  Upper airway Symptoms improved off Losartan  rx sent  Cont w/ follow up with PCP

## 2013-09-08 NOTE — Assessment & Plan Note (Signed)
Compensated without active flare  Cont on current regimen

## 2013-09-09 ENCOUNTER — Other Ambulatory Visit: Payer: Self-pay

## 2013-09-09 ENCOUNTER — Telehealth: Payer: Self-pay | Admitting: *Deleted

## 2013-09-09 DIAGNOSIS — Z139 Encounter for screening, unspecified: Secondary | ICD-10-CM

## 2013-09-09 NOTE — Telephone Encounter (Signed)
Left patient a message to call the office to r/s appt due to a meeting the physician has.

## 2013-09-09 NOTE — Progress Notes (Signed)
I called pt and his mother said that he is allergic to the Contrast dye. The order for the creatinine has been faxed to [facility].  Please advise!

## 2013-09-09 NOTE — Progress Notes (Signed)
Please see Dr. Oneida Alar recommendations.  Need CT A/P with contrast for abdominal pain, h/o sarcoidosis. He will need a Creatinine.

## 2013-09-10 ENCOUNTER — Telehealth: Payer: Self-pay | Admitting: Gastroenterology

## 2013-09-10 ENCOUNTER — Other Ambulatory Visit: Payer: Self-pay | Admitting: Gastroenterology

## 2013-09-10 DIAGNOSIS — R109 Unspecified abdominal pain: Secondary | ICD-10-CM

## 2013-09-10 MED ORDER — DIPHENHYDRAMINE HCL 25 MG PO CAPS: ORAL_CAPSULE | ORAL | Status: DC

## 2013-09-10 MED ORDER — PREDNISONE 10 MG PO TABS: ORAL_TABLET | ORAL | Status: DC

## 2013-09-10 NOTE — Telephone Encounter (Signed)
Noted  

## 2013-09-10 NOTE — Telephone Encounter (Signed)
Called and left another message about patient appt being canceled and needing to be r/s. Waiting on a call back from the family.

## 2013-09-10 NOTE — Progress Notes (Signed)
I spoke to mother and she will call me back with a good day

## 2013-09-10 NOTE — Progress Notes (Signed)
CT is scheduled for Monday 07/27 at 1:45 and his mother is aware

## 2013-09-10 NOTE — Addendum Note (Signed)
Addended by: Danie Binder on: 09/10/2013 08:16 AM   Modules accepted: Orders

## 2013-09-10 NOTE — Progress Notes (Addendum)
PLEASE CALL PT'S MOTHER. PT HAS IV DYE ALLERGY-NEEDS PREDNISONE AND BENADRYL PREMED PRIOR TO CT. RX SENT.  PREDNISONE 50 MG 13 HRS, 7 HRS, AND 1 HR PRIOR TO SCAN BENADRYL 50 MG PO 1 HOUR PRIOR TO SCAN.

## 2013-09-10 NOTE — Progress Notes (Signed)
I called and informed pt's mom that the Rx's have been sent to the pharmacy.  Forwarding to Darius Bump to schedule.

## 2013-09-10 NOTE — Telephone Encounter (Signed)
Either it's the patient's wife or mother that called. She said someone had tried calling earlier today but she was traveling and couldn't take the call. Please call [phone]

## 2013-09-11 NOTE — Telephone Encounter (Signed)
Unable to reach patient. 230 patient canceled, moved patient 2pm appt to 230.

## 2013-09-12 ENCOUNTER — Encounter: Payer: Self-pay | Admitting: Neurology

## 2013-09-12 ENCOUNTER — Ambulatory Visit (INDEPENDENT_AMBULATORY_CARE_PROVIDER_SITE_OTHER): Payer: Medicare Other | Admitting: Neurology

## 2013-09-12 ENCOUNTER — Ambulatory Visit (HOSPITAL_COMMUNITY): Payer: Medicare Other

## 2013-09-12 VITALS — BP 145/90 | HR 77

## 2013-09-12 DIAGNOSIS — G8110 Spastic hemiplegia affecting unspecified side: Secondary | ICD-10-CM

## 2013-09-12 MED ORDER — ONABOTULINUMTOXINA 100 UNITS IJ SOLR: 300.0000 [IU] | Freq: Once | INTRAMUSCULAR | Status: DC

## 2013-09-12 NOTE — Telephone Encounter (Signed)
I spoke with the patient's mother and confirmed pts appt 7/27 at 130 pm and she stated she will pick up his contrast today at [facility].

## 2013-09-12 NOTE — Addendum Note (Signed)
Addended by: Drema Dallas on: 09/12/2013 03:42 PM   Modules accepted: Orders
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
## 2013-09-16 ENCOUNTER — Ambulatory Visit (HOSPITAL_COMMUNITY)
Admission: RE | Admit: 2013-09-16 | Discharge: 2013-09-16 | Disposition: A | Discharge status: Home / Self Care | Payer: Medicare Other | Source: Ambulatory Visit | Attending: Gastroenterology | Admitting: Gastroenterology

## 2013-09-16 DIAGNOSIS — K4090 Unilateral inguinal hernia, without obstruction or gangrene, not specified as recurrent: Secondary | ICD-10-CM | POA: Diagnosis not present

## 2013-09-16 DIAGNOSIS — R109 Unspecified abdominal pain: Secondary | ICD-10-CM

## 2013-09-16 DIAGNOSIS — D869 Sarcoidosis, unspecified: Secondary | ICD-10-CM | POA: Diagnosis not present

## 2013-09-16 LAB — POCT I-STAT CREATININE: CREATININE: 1.1 mg/dL (ref 0.50–1.35)

## 2013-09-16 MED ORDER — IOHEXOL 300 MG/ML  SOLN
100.0000 mL | Freq: Once | INTRAMUSCULAR | Status: AC | PRN
  Administered 2013-09-16: 100 mL via INTRAVENOUS

## 2013-09-18 ENCOUNTER — Ambulatory Visit: Payer: Medicare Other | Attending: Neurology | Admitting: Physical Therapy

## 2013-09-18 ENCOUNTER — Other Ambulatory Visit: Payer: Self-pay | Admitting: Neurology

## 2013-09-18 DIAGNOSIS — M2420 Disorder of ligament, unspecified site: Secondary | ICD-10-CM | POA: Diagnosis not present

## 2013-09-18 DIAGNOSIS — M2550 Pain in unspecified joint: Secondary | ICD-10-CM | POA: Insufficient documentation

## 2013-09-18 DIAGNOSIS — M629 Disorder of muscle, unspecified: Secondary | ICD-10-CM | POA: Diagnosis not present

## 2013-09-18 DIAGNOSIS — G8110 Spastic hemiplegia affecting unspecified side: Secondary | ICD-10-CM

## 2013-09-18 DIAGNOSIS — M6281 Muscle weakness (generalized): Secondary | ICD-10-CM | POA: Insufficient documentation

## 2013-09-18 DIAGNOSIS — I69959 Hemiplegia and hemiparesis following unspecified cerebrovascular disease affecting unspecified side: Secondary | ICD-10-CM | POA: Diagnosis not present

## 2013-09-18 DIAGNOSIS — IMO0001 Reserved for inherently not codable concepts without codable children: Secondary | ICD-10-CM | POA: Insufficient documentation

## 2013-09-23 ENCOUNTER — Telehealth: Payer: Self-pay

## 2013-09-23 NOTE — Telephone Encounter (Signed)
Pt's mother is calling to get the results from his CT scan. Please advise

## 2013-09-26 NOTE — Telephone Encounter (Signed)
Noted  

## 2013-09-26 NOTE — Telephone Encounter (Signed)
Will call if he develops pain for [place] to send to surgeon

## 2013-09-26 NOTE — Telephone Encounter (Signed)
Pt's mother is aware of results.  

## 2013-09-26 NOTE — Telephone Encounter (Signed)
PLEASE CALL PT'S MOTHER. HIS CT SHOWS SARCOID IN HIS LUNGS AND A RIGHT INGUINAL HERNIA THAT CONTAINS BLADDER. HE NEEDS TO SEE A SURGEON IF HE DEVELOPS RIGHT GROIN PAIN. FOLLOW UP WITH RHEUMATOLOGY FOR GRANULOMATOUS GASTRITIS. OPV W/ [facility] IN DEC 2015 E30 DYSPHAGIA/GRANULOMATOUS GASTRITIS.

## 2013-10-02 ENCOUNTER — Ambulatory Visit: Payer: Medicare Other | Admitting: Occupational Therapy

## 2013-10-02 ENCOUNTER — Ambulatory Visit: Payer: Medicare Other | Attending: Neurology | Admitting: Physical Therapy

## 2013-10-02 DIAGNOSIS — IMO0001 Reserved for inherently not codable concepts without codable children: Secondary | ICD-10-CM | POA: Insufficient documentation

## 2013-10-02 DIAGNOSIS — M2550 Pain in unspecified joint: Secondary | ICD-10-CM | POA: Insufficient documentation

## 2013-10-02 DIAGNOSIS — M2420 Disorder of ligament, unspecified site: Secondary | ICD-10-CM | POA: Insufficient documentation

## 2013-10-02 DIAGNOSIS — I69959 Hemiplegia and hemiparesis following unspecified cerebrovascular disease affecting unspecified side: Secondary | ICD-10-CM | POA: Insufficient documentation

## 2013-10-02 DIAGNOSIS — M629 Disorder of muscle, unspecified: Secondary | ICD-10-CM | POA: Insufficient documentation

## 2013-10-02 DIAGNOSIS — M6281 Muscle weakness (generalized): Secondary | ICD-10-CM | POA: Diagnosis not present

## 2013-10-04 ENCOUNTER — Ambulatory Visit: Payer: Medicare Other | Admitting: Physical Therapy

## 2013-10-04 DIAGNOSIS — IMO0001 Reserved for inherently not codable concepts without codable children: Secondary | ICD-10-CM | POA: Diagnosis not present

## 2013-10-08 ENCOUNTER — Ambulatory Visit: Payer: Medicare Other | Admitting: Physical Therapy

## 2013-10-08 DIAGNOSIS — IMO0001 Reserved for inherently not codable concepts without codable children: Secondary | ICD-10-CM | POA: Diagnosis not present

## 2013-10-10 ENCOUNTER — Ambulatory Visit: Payer: Medicare Other | Admitting: Physical Therapy

## 2013-10-11 ENCOUNTER — Ambulatory Visit: Payer: Medicare Other | Admitting: Occupational Therapy

## 2013-10-11 DIAGNOSIS — IMO0001 Reserved for inherently not codable concepts without codable children: Secondary | ICD-10-CM | POA: Diagnosis not present

## 2013-10-15 ENCOUNTER — Ambulatory Visit: Payer: Medicare Other | Admitting: Physical Therapy

## 2013-10-15 ENCOUNTER — Ambulatory Visit: Payer: Medicare Other | Admitting: Occupational Therapy

## 2013-10-15 DIAGNOSIS — IMO0001 Reserved for inherently not codable concepts without codable children: Secondary | ICD-10-CM | POA: Diagnosis not present

## 2013-10-17 ENCOUNTER — Ambulatory Visit: Payer: Medicare Other | Admitting: Occupational Therapy

## 2013-10-17 ENCOUNTER — Ambulatory Visit (INDEPENDENT_AMBULATORY_CARE_PROVIDER_SITE_OTHER)
Admission: RE | Admit: 2013-10-17 | Discharge: 2013-10-17 | Disposition: A | Discharge status: Home / Self Care | Payer: Medicare Other | Source: Ambulatory Visit | Attending: Internal Medicine | Admitting: Internal Medicine

## 2013-10-17 ENCOUNTER — Ambulatory Visit: Payer: Medicare Other | Admitting: Physical Therapy

## 2013-10-17 ENCOUNTER — Ambulatory Visit (INDEPENDENT_AMBULATORY_CARE_PROVIDER_SITE_OTHER): Payer: Medicare Other | Admitting: Internal Medicine

## 2013-10-17 ENCOUNTER — Encounter: Payer: Self-pay | Admitting: Internal Medicine

## 2013-10-17 VITALS — BP 160/80 | HR 99 | Temp 98.4°F | Wt 168.2 lb

## 2013-10-17 DIAGNOSIS — R0609 Other forms of dyspnea: Secondary | ICD-10-CM

## 2013-10-17 DIAGNOSIS — D869 Sarcoidosis, unspecified: Secondary | ICD-10-CM

## 2013-10-17 DIAGNOSIS — R0989 Other specified symptoms and signs involving the circulatory and respiratory systems: Secondary | ICD-10-CM

## 2013-10-17 DIAGNOSIS — I6350 Cerebral infarction due to unspecified occlusion or stenosis of unspecified cerebral artery: Secondary | ICD-10-CM

## 2013-10-17 DIAGNOSIS — IMO0001 Reserved for inherently not codable concepts without codable children: Secondary | ICD-10-CM | POA: Diagnosis not present

## 2013-10-17 DIAGNOSIS — I10 Essential (primary) hypertension: Secondary | ICD-10-CM

## 2013-10-17 DIAGNOSIS — R0600 Dyspnea, unspecified: Secondary | ICD-10-CM

## 2013-10-17 NOTE — Assessment & Plan Note (Signed)
Not Adequate control on present rx, reviewed > no change in rx needed  Just needs to take meds each am as already directed   Fm insists he knows his meds and not interested in following checklist or med calendar

## 2013-10-17 NOTE — Assessment & Plan Note (Signed)
-   ex intol since age 58 - trial off acei 10/12/2012  - 11/19/2012   Walked RA x one lap @ 185 stopped due to  Legs gave out no desat  - trial of breo samples 11/19/2012 >> improved on breo/spiriva 04/01/2013 > try off spiriva 05/14/2013 due to dry mouth> no worse 08/09/2013  - Spirometry 11/19/2012  FEV1  1.29 (49) ratio 55  - Spirometry 10/17/2013   FEV1  1.17 (44%) ratio 55 off breo x one week and prednisone at 5 mg daily  - 10/17/2013   Walked RA x one lap @ 185 stopped due to fatigue not sob   No evidence of reproducible doe at this point so no need to restart inhalers

## 2013-10-17 NOTE — Progress Notes (Signed)
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 When did you first notice it ?  Mother "well the doctor's discovered" it dx with sarcoid in [place] and placed on prednisone since decades and maintained on prednisone and prinizil.  He was able to play sports in high school but mother says ?always needed advair (note advair wasn't made until he was 61) "well he was always on some pump" (interesting comment as he showed no capacity at all to use hfa).  Thru the interpreter after mutliple back and forths and asking the mother to let him answer the question, he has been variably sob since age 46, sometimes can't get across the room s giving out but never at rest, never sleeping, assoc with dry cough ? Since when  > on ACEi  Dx and rx as sarcoid by [name] pulmonary doc on pred 10 mg per day but not clear whether ever changed his breathing for the better and so maint on pred 10 mg per day  >>D/C acei and advair    10/25/2012 Follow up and Med review (Interpretor and family present for visit)  Patient returns for a two-week followup and medication review. We reviewed all his medications and organized them into medication calendar with patient education. ACE and Advair stopped last ov . No flare in cough or dyspnea. Uses ProAir most days in morning.  Remains on prednisone 10mg  daily  Appears to be taking meds correctly . Brother gives him his meds.   rec Follow med calendar closely and bring to each visit.  Use ProAir Inhaler only as needed for wheezing /shortness of breath    11/19/2012 f/u ov/Emmaclaire Switala re: sarcoid/asthma/copd  On pred 10 mg daily did not  bring calendar not apparently using it Chief Complaint  Patient presents with  . Follow-up    pt reports breathing is about the same since last OV, breathing is "different and harder " per mother patients has increased wheezing --  will be having surgery soon for tooth extraction  sob x 75 ft per brother Brother confused with med instructions re saba  despite calendar, says doe x across the room every time he tries. Noct spells of choking ever since stroke but swallowing ok. rec Try reducing prednisone to one half daily  Add pepcid 20 mg one at bedtime Add Breo one puff each am See calendar for specific medication   03/04/2013 f/u ov/Charles [name] re: sob / did not bring med calenar or all meds  Chief Complaint  Patient presents with  . Follow-up    went to ED 02/22/13 for SOB. C/o nasal congestion, breathing feels limited, clears throat frequently, PND left side.  Fm very confused with meds brother "you gave him spiriva" (no indication we did)  Pt thru interpreter says doing much better p proair but shows very little capacity to use the saba delivery methold and does not have neb.  rec Add Breo one puff each am.(you have 28 days of samples at this dose)  And continue spiriva one capsule each am for now Continue prednsine  10 mg one daily for now   . 04/01/2013 f/u ov/Charles [name] re: asthma/ sarcoid  Chief Complaint  Patient presents with  . Follow-up    Pt c/o food getting stuck in throat.  Denies cough, SOB.    Once a week feels food gets stuck in neck, better if swallows water. Not needing saba any more, still very poor mdi/dpi (see a/p) rec Keep working on perfecting inhaler technique - think about it like taking a drag off a cigarette. Try lowering prednisone dose to 10 mg one half daily  .05/13/2013 f/u ov/Charles [name] re: sarcoid/ pred down to 5 mg per day / ? ab Chief Complaint  Patient presents with  . Follow-up    Pt still c/o diff with swallowing.   hip stops before the breathing  - [name]  fu  Best breathing he's had in years Some dry mouth and dysphagia rec Try off spiriva to see if your swallowing and stomach feel better and your breathing stays the same without the spiriva, and if so you don't need the spiriva  Please schedule a follow up visit in 3 months but call sooner if needed with cxr on return   08/08/2013 f/u ov/Charles [name] re: dysphagia no better p stretching, better if uses water  Chief Complaint  Patient presents with  . Follow-up    Pt states that breahting has improved since last OV. Pt c/o increased SOB after eating--difficulty catching breath "gasping" per family members.  sob uses saba every day or two otherwise good control with Breo one daily  >>change losartan to benicar   09/05/13  Follow up (sarcoid/asthma/copd ) MW pt here for 4 week HTN follow up  Reports breathing is improved since last ov, Benicar working well for pt.   No new complaints. Last month was changed from losartan to benicar d/t potential for upper airway irritation .  Says his throat and breathing have improved.  Says tolerating benicar well w/ ok b/p .  rec No change rec    10/17/2013 f/u ov/Charles [name] re: ? Sob worse but definitely doing more  Chief Complaint  Patient presents with  . Follow-up    Pt states breathing is unchanged since the last visit.   missed am meds as in a hurry. Now no longer in w/c Off breo x over a week with no change in symptoms    No obvious day to day or daytime variabilty or assoc chronic cough or cp or chest tightness, subjective wheeze overt sinus or hb symptoms. No unusual exp hx or h/o childhood pna/ asthma or knowledge of premature birth.  Sleeping ok without nocturnal  or early am exacerbation  of respiratory  c/o's or need for noct saba. Also denies any obvious fluctuation of symptoms with weather or environmental changes or other aggravating or alleviating factors except as outlined above   Current Medications, Allergies, Complete Past Medical  History, Past Surgical History, Family History, and Social History were reviewed in [employer] [facility] record.  ROS  The following are not active complaints unless bolded sore throat, dysphagia, dental problems, itching, sneezing,  nasal congestion or excess/ purulent secretions, ear ache,   fever, chills, sweats, unintended wt loss, pleuritic or exertional cp, hemoptysis,  orthopnea pnd or leg swelling, presyncope, palpitations, heartburn, abdominal pain, anorexia, nausea, vomiting, diarrhea  or change in bowel or urinary habits, change in stools or urine, dysuria,hematuria,  rash, arthralgias, visual complaints, headache, numbness weakness or ataxia or problems with walking or coordination,  change in  mood/affect or memory.                       Objective:   Physical Exam    bm  Deaf for the first time no longer w/c bound on arrival   08/08/2013        170 > 172 7/16 >  10/17/2013 168   Interpretor in room   HEENT: nl dentition, turbinates, and orophanx. Nl external ear canals without cough reflex   NECK :  without JVD/Nodes/TM/ nl carotid upstrokes bilaterally   LUNGS: no acc muscle use, clear to A and P bilaterally without cough on insp or exp maneuvers   CV:  RRR  no s3 or murmur or increase in P2, no edema   ABD:  soft and nontender with nl excursion in the supine position. No bruits or organomegaly, bowel sounds nl  MS:  warm without deformities, calf tenderness, cyanosis or clubbing  SKIN: warm and dry without lesions        CXR  10/17/2013 : No active disease. Stable chronic changes due to sarcoid.       Assessment & Plan:

## 2013-10-17 NOTE — Patient Instructions (Addendum)
Please remember to go to the  x-ray department downstairs for your tests - we will call you with the results when they are available.  Ok to double the prednisone to 10 mg daily if breathing gets worse or cough worse.  Please schedule a follow up visit in 3 months but call sooner if needed

## 2013-10-17 NOTE — Assessment & Plan Note (Signed)
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  For now therefore I recommend the patient maintain  5 mg per day baseline with option to double dose for any flare of cough or sob.    Certainly if there are GI symptoms warranting more aggressive prednisone dose these will need to be titrated to a higher ceiling by GI as there are no "pulmonary markers" to help drive titration higher but would not drop below a floor of 5 mg for now

## 2013-10-17 NOTE — Progress Notes (Signed)
Quick Note:  Spoke with pt and notified of results per Dr. [name]. Pt verbalized understanding and denied any questions.  ______ 

## 2013-10-22 ENCOUNTER — Ambulatory Visit: Payer: Medicare Other | Admitting: Physical Therapy

## 2013-10-22 ENCOUNTER — Encounter: Payer: Medicare Other | Admitting: Occupational Therapy

## 2013-10-24 ENCOUNTER — Ambulatory Visit: Payer: Medicare Other | Admitting: Physical Therapy

## 2013-10-24 ENCOUNTER — Encounter: Payer: Medicare Other | Admitting: Occupational Therapy

## 2013-10-29 ENCOUNTER — Encounter: Payer: Medicare Other | Admitting: Occupational Therapy

## 2013-11-01 ENCOUNTER — Encounter: Payer: Medicare Other | Admitting: Occupational Therapy

## 2013-11-04 ENCOUNTER — Telehealth: Payer: Self-pay | Admitting: Neurology

## 2013-11-04 NOTE — Telephone Encounter (Signed)
Faxed initial summary for occupational therapy services to [facility] on 11/04/13.

## 2014-01-13 ENCOUNTER — Ambulatory Visit (INDEPENDENT_AMBULATORY_CARE_PROVIDER_SITE_OTHER): Payer: Medicare Other | Admitting: Adult Health

## 2014-01-13 ENCOUNTER — Encounter: Payer: Self-pay | Admitting: Adult Health

## 2014-01-13 ENCOUNTER — Ambulatory Visit: Payer: Medicare Other | Admitting: Internal Medicine

## 2014-01-13 VITALS — BP 130/84 | HR 84 | Temp 97.3°F | Wt 171.0 lb

## 2014-01-13 DIAGNOSIS — D869 Sarcoidosis, unspecified: Secondary | ICD-10-CM

## 2014-01-13 DIAGNOSIS — I639 Cerebral infarction, unspecified: Secondary | ICD-10-CM

## 2014-01-13 DIAGNOSIS — Z23 Encounter for immunization: Secondary | ICD-10-CM

## 2014-01-13 NOTE — Addendum Note (Signed)
Addended by: Parke Poisson E on: 01/13/2014 05:15 PM   Modules accepted: Orders

## 2014-01-13 NOTE — Progress Notes (Signed)
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 When did you first notice it ?  Mother "well the doctor's discovered" it dx with sarcoid in [place] and placed on prednisone since decades and maintained on prednisone and prinizil.  He was able to play sports in high school but mother says ?always needed advair (note advair wasn't made until he was 61) "well he was always on some pump" (interesting comment as he showed no capacity at all to use hfa).  Thru the interpreter after mutliple back and forths and asking the mother to let him answer the question, he has been variably sob since age 30, sometimes can't get across the room s giving out but never at rest, never sleeping, assoc with dry cough ? Since when  > on ACEi  Dx and rx as sarcoid by [name] pulmonary doc on pred 10 mg per day but not clear whether ever changed his breathing for the better and so maint on pred 10 mg per day  >>D/C acei and advair    10/25/2012 Follow up and Med review (Interpretor and family present for visit)  Patient returns for a two-week followup and medication review. We reviewed all his medications and organized them into medication calendar with patient education. ACE and Advair stopped last ov . No flare in cough or dyspnea. Uses ProAir most days in morning.  Remains on prednisone 10mg  daily  Appears to be taking meds correctly . Brother gives him his meds.   rec Follow med calendar closely and bring to each visit.  Use ProAir Inhaler only as needed for wheezing /shortness of breath    11/19/2012 f/u ov/Charles [name] re: sarcoid/asthma/copd  On pred 10 mg daily did not  bring calendar not apparently using it Chief Complaint  Patient presents with  . Follow-up    pt reports breathing is about the same since last OV, breathing is "different and harder " per mother patients has increased wheezing --  will be having surgery soon for tooth extraction  sob x 75 ft per brother Brother confused with med instructions re saba  despite calendar, says doe x across the room every time he tries. Noct spells of choking ever since stroke but swallowing ok. rec Try reducing prednisone to one half daily  Add pepcid 20 mg one at bedtime Add Breo one puff each am See calendar for specific medication   03/04/2013 f/u ov/Charles [name] re: sob / did not bring med calenar or all meds  Chief Complaint  Patient presents with  . Follow-up    went to ED 02/22/13 for SOB. C/o nasal congestion, breathing feels limited, clears throat frequently, PND left side.  Fm very confused with meds brother "you gave him spiriva" (no indication we did)  Pt thru interpreter says doing much better p proair but shows very little capacity to use the saba delivery methold and does not have neb.  rec Add Breo one puff each am.(you have 28 days of samples at this dose)  And continue spiriva one capsule each am for now Continue prednsine  10 mg one daily for now   . 04/01/2013 f/u ov/Charles [name] re: asthma/ sarcoid  Chief Complaint  Patient presents with  . Follow-up    Pt c/o food getting stuck in throat.  Denies cough, SOB.    Once a week feels food gets stuck in neck, better if swallows water. Not needing saba any more, still very poor mdi/dpi (see a/p) rec Keep working on perfecting inhaler technique - think about it like taking a drag off a cigarette. Try lowering prednisone dose to 10 mg one half daily  .05/13/2013 f/u ov/Charles [name] re: sarcoid/ pred down to 5 mg per day / ? ab Chief Complaint  Patient presents with  . Follow-up    Pt still c/o diff with swallowing.   hip stops before the breathing  - Charles [name]  fu  Best breathing he's had in years Some dry mouth and dysphagia rec Try off spiriva to see if your swallowing and stomach feel better and your breathing stays the same without the spiriva, and if so you don't need the spiriva  Please schedule a follow up visit in 3 months but call sooner if needed with cxr on return   08/08/2013 f/u ov/Charles [name] re: dysphagia no better p stretching, better if uses water  Chief Complaint  Patient presents with  . Follow-up    Pt states that breahting has improved since last OV. Pt c/o increased SOB after eating--difficulty catching breath "gasping" per family members.  sob uses saba every day or two otherwise good control with Breo one daily  >>change losartan to benicar   09/05/13  Follow up (sarcoid/asthma/copd ) MW pt here for 4 week HTN follow up  Reports breathing is improved since last ov, Benicar working well for pt.   No new complaints. Last month was changed from losartan to benicar d/t potential for upper airway irritation .  Says his throat and breathing have improved.  Says tolerating benicar well w/ ok b/p .  rec No change rec    10/17/2013 f/u ov/Charles [name] re: ? Sob worse but definitely doing more  Chief Complaint  Patient presents with  . Follow-up    Pt states breathing is unchanged since the last visit.   missed am meds as in a hurry. Now no longer in w/c Off breo x over a week with no change in symptoms  >no changes   01/13/2014 Follow up Sarcoid  Returns for  3 month follow up Sarcoid - Reports is doing well since last ov, no new complaints Needs flu shot today Patient denies any chest pain, orthopnea, PND, leg swelling, hemoptysis, cough or rash Remains on prednisone 5mg  daily    Current Medications, Allergies, Complete Past Medical History, Past Surgical History, Family History, and Social History were reviewed in [employer] record.  ROS  The following are not active complaints unless bolded sore  throat, dysphagia, dental problems, itching, sneezing,  nasal congestion or excess/ purulent secretions, ear ache,   fever, chills, sweats, unintended wt loss, pleuritic or exertional cp, hemoptysis,  orthopnea pnd or leg swelling, presyncope, palpitations, heartburn, abdominal pain, anorexia, nausea, vomiting, diarrhea  or change in bowel or urinary habits, change in stools or urine, dysuria,hematuria,  rash, arthralgias, visual complaints, headache, numbness weakness or ataxia or problems with walking or coordination,  change in mood/affect or memory.                       Objective:   Physical Exam  bm  Deaf    08/08/2013        170 > 172 7/16 >  10/17/2013 168 >171 01/13/2014   Interpretor in room /Deaf   HEENT: nl dentition, turbinates, and orophanx. Nl external ear canals without cough reflex   NECK :  without JVD/Nodes/TM/ nl carotid upstrokes bilaterally   LUNGS: no acc muscle use, clear to A and P bilaterally without cough on insp or exp maneuvers   CV:  RRR  no s3 or murmur or increase in P2, no edema   ABD:  soft and nontender with nl excursion in the supine position. No bruits or organomegaly, bowel sounds nl  MS:  warm without deformities, calf tenderness, cyanosis or clubbing  SKIN: warm and dry without lesions        CXR  10/17/2013 : No active disease. Stable chronic changes due to sarcoid.       Assessment & Plan:

## 2014-01-13 NOTE — Patient Instructions (Signed)
Continue on current  regimen  Flu shot today  follow up Dr. Melvyn Novas  In 4 months and As needed

## 2014-01-13 NOTE — Assessment & Plan Note (Signed)
Compensated on present regimen   Plan  Continue on current  regimen  Flu shot today  follow up Dr. Melvyn Novas  In 4 months and As needed

## 2014-01-15 ENCOUNTER — Ambulatory Visit (INDEPENDENT_AMBULATORY_CARE_PROVIDER_SITE_OTHER): Payer: Medicare Other | Admitting: Neurology

## 2014-01-15 ENCOUNTER — Encounter: Payer: Self-pay | Admitting: Neurology

## 2014-01-15 DIAGNOSIS — G8114 Spastic hemiplegia affecting left nondominant side: Secondary | ICD-10-CM

## 2014-01-15 DIAGNOSIS — G8110 Spastic hemiplegia affecting unspecified side: Secondary | ICD-10-CM

## 2014-01-15 DIAGNOSIS — D869 Sarcoidosis, unspecified: Secondary | ICD-10-CM

## 2014-01-15 DIAGNOSIS — I639 Cerebral infarction, unspecified: Secondary | ICD-10-CM

## 2014-01-15 MED ORDER — ONABOTULINUMTOXINA 100 UNITS IJ SOLR
300.0000 [IU] | Freq: Once | INTRAMUSCULAR | Status: AC
  Administered 2014-01-15: 300 [IU] via INTRAMUSCULAR
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
## 2014-03-05 ENCOUNTER — Other Ambulatory Visit: Payer: Self-pay | Admitting: Adult Health

## 2014-03-06 NOTE — Telephone Encounter (Signed)
Per 7.16.15 ov note w/ TP:  Essential hypertension, benign - Melvenia Needles, NP at 09/08/2013 12:08 PM     Status: Edited Related Problem: Essential hypertension, benign   Expand All Collapse All   Controlled on benicar  Upper airway Symptoms improved off Losartan  rx sent  Cont w/ follow up with PCP

## 2014-04-16 ENCOUNTER — Ambulatory Visit: Payer: Medicare Other | Admitting: Neurology

## 2014-04-16 ENCOUNTER — Encounter: Payer: Self-pay | Admitting: *Deleted

## 2014-04-16 ENCOUNTER — Telehealth: Payer: Self-pay | Admitting: *Deleted

## 2014-04-16 NOTE — Telephone Encounter (Signed)
No showed Botox appt.

## 2014-04-17 ENCOUNTER — Encounter: Payer: Self-pay | Admitting: Neurology

## 2014-05-13 ENCOUNTER — Ambulatory Visit (INDEPENDENT_AMBULATORY_CARE_PROVIDER_SITE_OTHER)
Admission: RE | Admit: 2014-05-13 | Discharge: 2014-05-13 | Disposition: A | Discharge status: Home / Self Care | Payer: Medicare Other | Source: Ambulatory Visit | Attending: Internal Medicine | Admitting: Internal Medicine

## 2014-05-13 ENCOUNTER — Ambulatory Visit (INDEPENDENT_AMBULATORY_CARE_PROVIDER_SITE_OTHER): Payer: Medicare Other | Admitting: Internal Medicine

## 2014-05-13 ENCOUNTER — Encounter: Payer: Self-pay | Admitting: Internal Medicine

## 2014-05-13 VITALS — BP 142/96 | HR 80 | Ht 65.0 in | Wt 167.0 lb

## 2014-05-13 DIAGNOSIS — D869 Sarcoidosis, unspecified: Secondary | ICD-10-CM

## 2014-05-13 DIAGNOSIS — R0600 Dyspnea, unspecified: Secondary | ICD-10-CM

## 2014-05-13 NOTE — Assessment & Plan Note (Addendum)
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  - 05/13/2014 arrived on pred 10 mg daily saying the steroids weren't working   I had an extended discussion with the patient and mother/ brother reviewing all relevant studies completed to date and  lasting 35 minutes of a 45 minute visit (making multiple trips back to the exam room to explain the written instructions/ help the interpret clarify/ being assured the pt could read them but suspecting he's doing it by memory, not the written text given to him and highlighted) on the following ongoing concerns:   1) the three of them have continually resisted my multiple efforts to do accurate med reconciliation and he is again not taking the medications the way I asked him to and now says he's worse ? Why  2) no evidence of active sarcoid   3) I really have not choice but to recommend they seek another opinion from a different pulmonary group and have told them flatly that I would not be able to continue to participate in his care using an inaccurate undated med list and it was up to them to correct the list if it's not right and follow the instructions between visits

## 2014-05-13 NOTE — Progress Notes (Signed)
Subjective:   Patient ID: OSEI ANGER, male    DOB: 02-Dec-1955    MRN: [id number]  Brief patient profile:  36 yowm dx quit smoking in 1994 first aware of sob age 58 referred to pulmonary clinic 10/11/12 with dx of sarcoid and atypical sob ? acei effect    History of Present Illness   Deaf [name] -Interpretor  Hx of CVA w/ hemiplegia   10/11/2012  1st [facility] pulmonary eval cc short of breath ? When did you first notice it ?  Mother "well the doctor's discovered" it dx with sarcoid in [place] and placed on prednisone since decades and maintained on prednisone and prinizil.  He was able to play sports in high school but mother says ?always needed advair (note advair wasn't made until he was 66) "well he was always on some pump" (interesting comment as he showed no capacity at all to use hfa).  Thru the interpreter after mutliple back and forths and asking the mother to let him answer the question, he has been variably sob since age 54, sometimes can't get across the room s giving out but never at rest, never sleeping, assoc with dry cough ? Since when  > on ACEi  Dx and rx as sarcoid by [name] doc on pred 10 mg per day but not clear whether ever changed his breathing for the better and so maint on pred 10 mg per day  >>D/C acei and advair    10/25/2012 Follow up and Med review (Interpretor and family present for visit)  Patient returns for a two-week followup and medication review. We reviewed all his medications and organized them into medication calendar with patient education. ACE and Advair stopped last ov . No flare in cough or dyspnea. Uses ProAir most days in morning.  Remains on prednisone 10mg  daily  Appears to be taking meds correctly . Brother gives him his meds.   rec Follow med calendar closely and bring to each visit.  Use ProAir Inhaler only as needed for wheezing /shortness of breath    11/19/2012 f/u ov/Tacara Hadlock re: sarcoid/asthma/copd  On pred 10 mg daily did not  bring calendar not apparently using it Chief Complaint  Patient presents with  . Follow-up    pt reports breathing is about the same since last OV, breathing is "different and harder " per mother patients has increased wheezing --  will be having surgery soon for tooth extraction  sob x 75 ft per brother Brother confused with med instructions re saba  despite calendar, says doe x across the room every time he tries. Noct spells of choking ever since stroke but swallowing ok. rec Try reducing prednisone to one half daily  Add pepcid 20 mg one at bedtime Add Breo one puff each am See calendar for specific medication   03/04/2013 f/u ov/Nike Southwell re: sob / did not bring med calenar or all meds  Chief Complaint  Patient presents with  . Follow-up    went to ED 02/22/13 for SOB. C/o nasal congestion, breathing feels limited, clears throat frequently, PND left side.  Fm very confused with meds brother "you gave him spiriva" (no indication we did)  Pt thru interpreter says doing much better p proair but shows very little capacity to use the saba delivery methold and does not have neb.  rec Add Breo one puff each am.(you have 28 days of samples at this dose)  And continue spiriva one capsule each am for now Continue prednsine 10  mg one daily for now   . 04/01/2013 f/u ov/Eisen Robenson re: asthma/ sarcoid  Chief Complaint  Patient presents with  . Follow-up    Pt c/o food getting stuck in throat.  Denies cough, SOB.    Once a week feels food gets stuck in neck, better if swallows water. Not needing saba any more, still very poor mdi/dpi (see a/p) rec Keep working on perfecting inhaler technique - think about it like taking a drag off a cigarette. Try lowering prednisone dose to 10 mg one half daily  .05/13/2013 f/u ov/Coben Godshall re: sarcoid/ pred down to 5 mg per day / ? ab Chief Complaint  Patient presents with  . Follow-up    Pt still c/o diff with swallowing.   hip stops before the breathing  - [name]  fu  Best breathing he's had in years Some dry mouth and dysphagia rec Try off spiriva to see if your swallowing and stomach feel better and your breathing stays the same without the spiriva, and if so you don't need the spiriva  Please schedule a follow up visit in 3 months but call sooner if needed with cxr on return   08/08/2013 f/u ov/Shahida Schnackenberg re: dysphagia no better p stretching, better if uses water  Chief Complaint  Patient presents with  . Follow-up    Pt states that breahting has improved since last OV. Pt c/o increased SOB after eating--difficulty catching breath "gasping" per family members.  sob uses saba every day or two otherwise good control with Breo one daily  >>change losartan to benicar     10/17/2013 f/u ov/Braylea Brancato re: ? Sob worse but definitely doing more  Chief Complaint  Patient presents with  . Follow-up    Pt states breathing is unchanged since the last visit.   missed am meds as in a hurry. Now no longer in w/c Off breo x over a week with no change in symptoms  >no changes / ok to leave off breo as not not convinced helping   01/13/2014 Follow up Sarcoid  Returns for  3 month follow up Sarcoid - Reports is doing well since last ov, no new complaints Needs flu shot today  Remains on prednisone 5mg  daily  rec  Continue on current  regimen (5 mg daily )  Flu shot today    05/13/2014 f/u ov/Avarae Zwart re: sarcoid/ chronically steroid dep  Chief Complaint  Patient presents with  . Follow-up    Pt c/o incr SOB and dryness in chest when breathing. Pt does not feel that Prednisone is working. Pt reports changing diet some since last OV.   maintained on 10 mg daily  No worse cough  "brought all meds"  =  No proair  Pt has assumed all med management and taking ppi incorrectly and pred 10 where it was one half Pt nor fm using the med calendar format    No obvious day to day or daytime variabilty or assoc chronic cough or cp or chest tightness, subjective wheeze overt  sinus or hb symptoms. No unusual exp hx or h/o childhood pna/ asthma or knowledge of premature birth.  Sleeping ok without nocturnal  or early am exacerbation  of respiratory  c/o's or need for noct saba. Also denies any obvious fluctuation of symptoms with weather or environmental changes or other aggravating or alleviating factors except as outlined above   Current Medications, Allergies, Complete Past Medical History, Past Surgical History, Family History, and Social History were reviewed in [employer]  electronic medical record.  ROS  The following are not active complaints unless bolded sore throat, dysphagia, dental problems, itching, sneezing,  nasal congestion or excess/ purulent secretions, ear ache,   fever, chills, sweats, unintended wt loss, pleuritic or exertional cp, hemoptysis,  orthopnea pnd or leg swelling, presyncope, palpitations, heartburn, abdominal pain, anorexia, nausea, vomiting, diarrhea  or change in bowel or urinary habits, change in stools or urine, dysuria,hematuria,  rash, arthralgias, visual complaints, headache, numbness weakness or ataxia or problems with walking or coordination,  change in mood/affect or memory.             Objective:   Physical Exam    amb bm  Deaf  [facility] 2   08/08/2013  170 > 172 7/16 >  10/17/2013 168 >171 01/13/2014 > 05/13/2014  167  Interpretor in room /Deaf   HEENT: nl dentition, turbinates, and orophanx. Nl external ear canals without cough reflex   NECK :  without JVD/Nodes/TM/ nl carotid upstrokes bilaterally   LUNGS: no acc muscle use, clear to A and P bilaterally without cough on insp or exp maneuvers   CV:  RRR  no s3 or murmur or increase in P2, no edema   ABD:  soft and nontender with nl excursion in the supine position. No bruits or organomegaly, bowel sounds nl  MS:  warm without deformities, calf tenderness, cyanosis or clubbing  SKIN: warm and dry without lesions        CXR PA and Lateral:   05/13/2014 :      I personally reviewed images and agree with radiology impression as follows:   Stable changes consistent with sarcoidosis. There is no active cardiopulmonary disease.      Assessment & Plan:

## 2014-05-13 NOTE — Assessment & Plan Note (Signed)
-   ex intol since age 59 - trial off acei 10/12/2012  - 11/19/2012   Walked RA x one lap @ 185 stopped due to  Legs gave out no desat  - trial of breo samples 11/19/2012 >> improved on breo/spiriva 04/01/2013 > try off spiriva 05/14/2013 due to dry mouth> no worse 08/09/2013  - Spirometry 11/19/2012  FEV1  1.29 (49) ratio 55  - Spirometry 10/17/2013   FEV1  1.17 (44%) ratio 55 off breo x one week and prednisone at 5 mg daily > leave off breo as he didn't think it helped - 10/17/2013   Walked RA x one lap @ 185 stopped due to fatigue not sob   He does have airflow obstruction but I have not been convinced watching him walk on multiple occasions that it is sob that stops him from walking.  Since he was a former smoker and has airflow obst not responsive to breo ok to consider trial of incruse or anoro using the same elipta device with which he is familiar

## 2014-05-13 NOTE — Patient Instructions (Addendum)
Please remember to go to the x-ray department downstairs for your tests - we will call you with the results when they are available.  Only use your albuterol as a rescue medication to be used if you can't catch your breath by resting or doing a relaxed purse lip breathing pattern.  - The less you use it, the better it will work when you need it. - Ok to use up to 2 puffs  every 4 hours if you must but call for immediate appointment if use goes up over your usual need - Don't leave home without it !!  (think of it like the spare tire for your car)   Ok to double prednisone if breathing worse between now and the next visit  Nexium 40 mg Take 30-60 min should be before first meal of the day   Please schedule a follow up office visit in 4 weeks, sooner if needed with pfts and bring inhaler with you

## 2014-05-14 NOTE — Progress Notes (Signed)
Quick Note:  Spoke with pt and notified of results per Dr. [name]. Pt verbalized understanding and denied any questions.  ______ 

## 2014-05-15 ENCOUNTER — Encounter: Payer: Self-pay | Admitting: Neurology

## 2014-06-10 ENCOUNTER — Ambulatory Visit: Payer: Medicare Other | Admitting: Internal Medicine

## 2014-06-13 ENCOUNTER — Ambulatory Visit (HOSPITAL_COMMUNITY)
Admission: RE | Admit: 2014-06-13 | Discharge: 2014-06-13 | Disposition: A | Discharge status: Home / Self Care | Payer: Medicare Other | Source: Ambulatory Visit | Attending: Internal Medicine | Admitting: Internal Medicine

## 2014-06-13 ENCOUNTER — Ambulatory Visit (INDEPENDENT_AMBULATORY_CARE_PROVIDER_SITE_OTHER): Payer: Medicare Other | Admitting: Internal Medicine

## 2014-06-13 ENCOUNTER — Ambulatory Visit: Payer: Medicare Other | Admitting: Internal Medicine

## 2014-06-13 VITALS — BP 122/80 | HR 70 | Ht 65.0 in | Wt 172.8 lb

## 2014-06-13 DIAGNOSIS — D869 Sarcoidosis, unspecified: Secondary | ICD-10-CM | POA: Insufficient documentation

## 2014-06-13 DIAGNOSIS — R911 Solitary pulmonary nodule: Secondary | ICD-10-CM

## 2014-06-13 DIAGNOSIS — J449 Chronic obstructive pulmonary disease, unspecified: Secondary | ICD-10-CM

## 2014-06-13 DIAGNOSIS — F17210 Nicotine dependence, cigarettes, uncomplicated: Secondary | ICD-10-CM | POA: Diagnosis not present

## 2014-06-13 DIAGNOSIS — R0609 Other forms of dyspnea: Secondary | ICD-10-CM | POA: Insufficient documentation

## 2014-06-13 LAB — PULMONARY FUNCTION TEST
DL/VA % pred: 109 %
DL/VA: 4.58 ml/min/mmHg/L
DLCO unc % pred: 65 %
DLCO unc: 15.93 ml/min/mmHg
FEF 25-75 Post: 0.99 L/sec
FEF 25-75 Pre: 0.39 L/sec
FEF2575-%CHANGE-POST: 155 %
FEF2575-%Pred-Post: 41 %
FEF2575-%Pred-Pre: 16 %
FEV1-%CHANGE-POST: 30 %
FEV1-%Pred-Post: 60 %
FEV1-%Pred-Pre: 46 %
FEV1-Post: 1.47 L
FEV1-Pre: 1.13 L
FEV1FVC-%Change-Post: 4 %
FEV1FVC-%Pred-Pre: 64 %
FEV6-%Change-Post: 30 %
FEV6-%PRED-POST: 85 %
FEV6-%Pred-Pre: 65 %
FEV6-POST: 2.59 L
FEV6-PRE: 1.98 L
FEV6FVC-%CHANGE-POST: 4 %
FEV6FVC-%PRED-PRE: 92 %
FEV6FVC-%Pred-Post: 96 %
FVC-%Change-Post: 25 %
FVC-%PRED-POST: 88 %
FVC-%PRED-PRE: 70 %
FVC-POST: 2.78 L
FVC-PRE: 2.22 L
POST FEV1/FVC RATIO: 53 %
POST FEV6/FVC RATIO: 93 %
Pre FEV1/FVC ratio: 51 %
Pre FEV6/FVC Ratio: 89 %
RV % pred: 117 %
RV: 2.25 L
TLC % PRED: 79 %
TLC: 4.61 L

## 2014-06-13 MED ORDER — ALBUTEROL SULFATE (2.5 MG/3ML) 0.083% IN NEBU
2.5000 mg | INHALATION_SOLUTION | Freq: Once | RESPIRATORY_TRACT | Status: AC
  Administered 2014-06-13: 2.5 mg via RESPIRATORY_TRACT

## 2014-06-13 MED ORDER — MOMETASONE FURO-FORMOTEROL FUM 200-5 MCG/ACT IN AERO: INHALATION_SPRAY | RESPIRATORY_TRACT | Status: DC

## 2014-06-13 NOTE — Patient Instructions (Addendum)
Start dulera 200 Take 2 puffs first thing in am and then another 2 puffs about 12 hours later and your breathing should improve and your need for rescue should go down   Continue also the spiriva each am  Continue Nexium 40 mg Take 30-60 min before first meal of the day   Only use your albuterol (proair)as a rescue medication to be used if you can't catch your breath by resting or doing a relaxed purse lip breathing pattern.  - The less you use it, the better it will work when you need it. - Ok to use up to 2 puffs  every 4 hours if you must but call for immediate appointment if use goes up over your usual need - Don't leave home without it !!  (think of it like the spare tire for your car)      See Tammy NP 2 weeks with all your medications, even over the counter meds, separated in two separate bags, the ones you take no matter what vs the ones you stop once you feel better and take only as needed when you feel you need them.   Tammy  will generate for you a new user friendly medication calendar that will put [place] all on the same page re: your medication use.     Without this process, it simply isn't possible to assure that we are providing  your outpatient care  with  the attention to detail we feel you deserve.   If we cannot assure that you're getting that kind of care,  then we cannot manage your problem effectively from this clinic.  Once you have seen Tammy and we are sure that we're all on the same page with your medication use she will arrange follow up with me.

## 2014-06-13 NOTE — Progress Notes (Signed)
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 When did you first notice it ?  Mother "well the doctor's discovered" it dx with sarcoid in [place] and placed on prednisone since decades and maintained on prednisone and prinizil.  He was able to play sports in high school but mother says ?always needed advair (note advair wasn't made until he was 77) "well he was always on some pump" (interesting comment as he showed no capacity at all to use hfa).  Thru the interpreter after mutliple back and forths and asking the mother to let him answer the question, he has been variably sob since age 13, sometimes can't get across the room s giving out but never at rest, never sleeping, assoc with dry cough ? Since when  > on ACEi  Dx and rx as sarcoid by [name] pulmonary doc on pred 10 mg per day but not clear whether ever changed his breathing for the better and so maint on pred 10 mg per day  >>D/C acei and advair    10/25/2012 Follow up and Med review (Interpretor and family present for visit)  Patient returns for a two-week followup and medication review. We reviewed all his medications and organized them into medication calendar with patient education. ACE and Advair stopped last ov . No flare in cough or dyspnea. Uses ProAir most days in morning.  Remains on prednisone 10mg  daily  Appears to be taking meds correctly . Brother gives him his meds.   rec Follow med calendar closely and bring to each visit.  Use ProAir Inhaler only as needed for wheezing /shortness of breath    11/19/2012 f/u ov/Charles [name] re: sarcoid/asthma/copd  On pred 10 mg daily did not  bring calendar not apparently using it Chief Complaint  Patient presents with  . Follow-up    pt reports breathing is about the same since last OV, breathing is "different and harder " per mother patients has increased wheezing --  will be having surgery soon for tooth extraction  sob x 75 ft per brother Brother confused with med instructions re saba  despite calendar, says doe x across the room every time he tries. Noct spells of choking ever since stroke but swallowing ok. rec Try reducing prednisone to one half daily  Add pepcid 20 mg one at bedtime Add Breo one puff each am See calendar for specific medication   03/04/2013 f/u ov/Charles [name] re: sob / did not bring med calenar or all meds  Chief Complaint  Patient presents with  . Follow-up    went to ED 02/22/13 for SOB. C/o nasal congestion, breathing feels limited, clears throat frequently, PND left side.  Fm very confused with meds brother "you gave him spiriva" (no indication we did)  Pt thru interpreter says doing much better p proair but shows very little capacity to use the saba delivery methold and does not have neb.  rec Add Breo one puff each am.(you have 28 days of samples at this dose)  And continue spiriva one capsule each am for now Continue prednsine 10  mg one daily for now   . 04/01/2013 f/u ov/Charles [name] re: asthma/ sarcoid  Chief Complaint  Patient presents with  . Follow-up    Pt c/o food getting stuck in throat.  Denies cough, SOB.    Once a week feels food gets stuck in neck, better if swallows water. Not needing saba any more, still very poor mdi/dpi (see a/p) rec Keep working on perfecting inhaler technique - think about it like taking a drag off a cigarette. Try lowering prednisone dose to 10 mg one half daily  .05/13/2013 f/u ov/Charles [name] re: sarcoid/ pred down to 5 mg per day / ? ab Chief Complaint  Patient presents with  . Follow-up    Pt still c/o diff with swallowing.   hip stops before the breathing  - [name]  fu  Best breathing he's had in years Some dry mouth and dysphagia rec Try off spiriva to see if your swallowing and stomach feel better and your breathing stays the same without the spiriva, and if so you don't need the spiriva  Please schedule a follow up visit in 3 months but call sooner if needed with cxr on return   08/08/2013 f/u ov/Charles [name] re: dysphagia no better p stretching, better if uses water  Chief Complaint  Patient presents with  . Follow-up    Pt states that breahting has improved since last OV. Pt c/o increased SOB after eating--difficulty catching breath "gasping" per family members.  sob uses saba every day or two otherwise good control with Breo one daily  >>change losartan to benicar     10/17/2013 f/u ov/Charles [name] re: ? Sob worse but definitely doing more  Chief Complaint  Patient presents with  . Follow-up    Pt states breathing is unchanged since the last visit.   missed am meds as in a hurry. Now no longer in w/c Off breo x over a week with no change in symptoms  >no changes / ok to leave off breo as not not convinced helping   01/13/2014 Follow up Sarcoid  Returns for  3 month follow up Sarcoid - Reports is doing well since last ov, no new complaints Needs flu shot today  Remains on prednisone 5mg  daily  rec  Continue on current  regimen (5 mg daily )  Flu shot today    05/13/2014 f/u ov/Charles [name] re: sarcoid/ chronically steroid dep  Chief Complaint  Patient presents with  . Follow-up    Pt c/o incr SOB and dryness in chest when breathing. Pt does not feel that Prednisone is working. Pt reports changing diet some since last OV.   maintained on 10 mg daily  No worse cough  "brought all meds"  =  No proair  Pt has assumed all med management and taking ppi incorrectly and pred 10 where it was one half Pt nor fm using the med calendar format rec Please remember to go to the x-ray department downstairs for your tests - we will call you with the results when they  are available. Only use your albuterol as a rescue medication to  [facility] to double prednisone if breathing worse between now and the next visit Nexium 40 mg Take 30-60 min should be before first meal of the day  Please schedule a follow up office visit in 4 weeks, sooner if needed with pfts and bring inhaler with you    06/13/2014 f/u ov/Charles [name] re: copd GOLD II with reversiblity / did not bring spiriva with him / did bring  proair but hfa quite poor/ pred at 10 mg daily   Chief Complaint  Patient presents with  . Follow-up    PFT done today. Breathing has improved. No new co's today.     Still not taking ppi ac     No obvious day to day or daytime variabilty or assoc chronic cough or cp or chest tightness, subjective wheeze overt sinus or hb symptoms. No unusual exp hx or h/o childhood pna/ asthma or knowledge of premature birth.  Sleeping ok without nocturnal  or early am exacerbation  of respiratory  c/o's or need for noct saba. Also denies any obvious fluctuation of symptoms with weather or environmental changes or other aggravating or alleviating factors except as outlined above   Current Medications, Allergies, Complete Past Medical History, Past Surgical History, Family History, and Social History were reviewed in [employer] record.  ROS  The following are not active complaints unless bolded sore throat, dysphagia, dental problems, itching, sneezing,  nasal congestion or excess/ purulent secretions, ear ache,   fever, chills, sweats, unintended wt loss, pleuritic or exertional cp, hemoptysis,  orthopnea pnd or leg swelling, presyncope, palpitations, heartburn, abdominal pain, anorexia, nausea, vomiting, diarrhea  or change in bowel or urinary habits, change in stools or urine, dysuria,hematuria,  rash, arthralgias, visual complaints, headache, numbness weakness or ataxia or problems with walking or coordination,  change in mood/affect or memory.              Objective:   Physical Exam    amb bm  Deaf  [facility]   08/08/2013  170 > 172 7/16 >  10/17/2013 168 >171 01/13/2014 > 05/13/2014  167>06/13/2014   172    Interpretor in room /Deaf   HEENT: nl dentition, turbinates, and orophanx. Nl external ear canals without cough reflex   NECK :  without JVD/Nodes/TM/ nl carotid upstrokes bilaterally   LUNGS: no acc muscle use, clear to A and P bilaterally without cough on insp or exp maneuvers   CV:  RRR  no s3 or murmur or increase in P2, no edema   ABD:  soft and nontender with nl excursion in the supine position. No bruits or organomegaly, bowel sounds nl  MS:  warm without deformities, calf tenderness, cyanosis or clubbing  SKIN: warm and dry without lesions        CXR PA and Lateral:   05/13/2014 :     I personally reviewed images and agree with radiology impression as follows:   Stable changes consistent with sarcoidosis. There is no active cardiopulmonary disease.      Assessment & Plan:

## 2014-06-14 ENCOUNTER — Encounter: Payer: Self-pay | Admitting: Internal Medicine

## 2014-06-14 NOTE — Assessment & Plan Note (Signed)
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  The goal with a chronic steroid dependent illness is always arriving at the lowest effective dose that controls the disease/symptoms and not accepting a set "formula" which is based on statistics or guidelines that don't always take into account patient  variability or the natural hx of the dz in every individual patient, which may well vary over time.  For now therefore I recommend the patient maintain  10 mg daily but once we do accurate med rec process should be able to start taper

## 2014-06-14 NOTE — Assessment & Plan Note (Signed)
-   RUL noted 11/19/12 vs 10/02/12  > cxr ok 05/13/14

## 2014-06-14 NOTE — Assessment & Plan Note (Signed)
-   ex intol since age 59 - trial off acei 10/12/2012  - 11/19/2012   Walked RA x one lap @ 185 stopped due to  Legs gave out no desat  - trial of breo samples 11/19/2012 >> improved on breo/spiriva 04/01/2013 > try off spiriva 05/14/2013 due to dry mouth> no worse 08/09/2013  - Spirometry 11/19/2012  FEV1  1.29 (49) ratio 55  - Spirometry 10/17/2013   FEV1  1.17 (44%) ratio 55 off breo x one week and prednisone at 5 mg daily > d/c BREO as pt not convinced help - 10/17/2013   Walked RA x one lap @ 185 stopped due to fatigue not sob  - PFTs  06/13/2014  FEV1 1.47(60) ratio 53 with 30% improvement   dlco 65 improves to 109 p alv vol while on pred 10 mg daily     The proper method of use, as well as anticipated side effects, of a metered-dose inhaler are discussed and demonstrated to the patient. Improved effectiveness after extensive coaching during this visit to a level of approximately  50% from a baseline of nearly zero   I had an extended discussion with the patient thru interpreter reviewing all relevant studies completed to date and  lasting 15 to 20 minutes of a 25 minute visit on the following ongoing concerns:  1) he continues to make mistakes with meds and the only way we can provide quality medical care to him is through accurate med reconciliation process .  To keep things simple, I have asked the patient to first separate medicines that are perceived as maintenance, that is to be taken daily "no matter what", from those medicines that are taken on only on an as-needed basis and I have given the patient examples of both, and then return to see our NP to generate a  detailed  medication calendar which should be followed until the next physician sees the patient and updates it.     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  Please see instructions for details which were reviewed in writing and the patient given a copy.

## 2014-07-01 ENCOUNTER — Encounter: Payer: Self-pay | Admitting: Adult Health

## 2014-07-01 ENCOUNTER — Ambulatory Visit (INDEPENDENT_AMBULATORY_CARE_PROVIDER_SITE_OTHER): Payer: Medicare Other | Admitting: Adult Health

## 2014-07-01 VITALS — BP 130/80 | HR 94 | Wt 176.0 lb

## 2014-07-01 DIAGNOSIS — J449 Chronic obstructive pulmonary disease, unspecified: Secondary | ICD-10-CM | POA: Diagnosis not present

## 2014-07-01 DIAGNOSIS — D869 Sarcoidosis, unspecified: Secondary | ICD-10-CM | POA: Diagnosis not present

## 2014-07-01 NOTE — Assessment & Plan Note (Signed)
Compensated Improved control on Dulera  Plan Follow med calendar closely and bring to each visit.  Follow up Dr. Melvyn Novas  In 3 months and As needed

## 2014-07-01 NOTE — Assessment & Plan Note (Signed)
Controlled  Continue on current regimen .   

## 2014-07-01 NOTE — Patient Instructions (Signed)
Follow med calendar closely and bring to each visit.   Follow up Dr. [name]  In 3 months  and As needed         

## 2014-07-01 NOTE — Progress Notes (Signed)
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 When did you first notice it ?  Mother "well the doctor's discovered" it dx with sarcoid in [place] and placed on prednisone since decades and maintained on prednisone and prinizil.  He was able to play sports in high school but mother says ?always needed advair (note advair wasn't made until he was 40) "well he was always on some pump" (interesting comment as he showed no capacity at all to use hfa).  Thru the interpreter after mutliple back and forths and asking the mother to let him answer the question, he has been variably sob since age 77, sometimes can't get across the room s giving out but never at rest, never sleeping, assoc with dry cough ? Since when  > on ACEi  Dx and rx as sarcoid by Charles [name] pulmonary doc on pred 10 mg per day but not clear whether ever changed his breathing for the better and so maint on pred 10 mg per day  >>D/C acei and advair    10/25/2012 Follow up and Med review (Interpretor and family present for visit)  Patient returns for a two-week followup and medication review. We reviewed all his medications and organized them into medication calendar with patient education. ACE and Advair stopped last ov . No flare in cough or dyspnea. Uses ProAir most days in morning.  Remains on prednisone 10mg  daily  Appears to be taking meds correctly . Brother gives him his meds.   rec Follow med calendar closely and bring to each visit.  Use ProAir Inhaler only as needed for wheezing /shortness of breath    11/19/2012 f/u ov/Charles [name] re: sarcoid/asthma/copd  On pred 10 mg daily did not  bring calendar not apparently using it Chief Complaint  Patient presents with  . Follow-up    pt reports breathing is about the same since last OV, breathing is "different and harder " per mother patients has increased wheezing --  will be having surgery soon for tooth extraction  sob x 75 ft per brother Brother confused with med instructions re saba  despite calendar, says doe x across the room every time he tries. Noct spells of choking ever since stroke but swallowing ok. rec Try reducing prednisone to one half daily  Add pepcid 20 mg one at bedtime Add Breo one puff each am See calendar for specific medication   03/04/2013 f/u ov/Charles [name] re: sob / did not bring med calenar or all meds  Chief Complaint  Patient presents with  . Follow-up    went to ED 02/22/13 for SOB. C/o nasal congestion, breathing feels limited, clears throat frequently, PND left side.  Fm very confused with meds brother "you gave him spiriva" (no indication we did)  Pt thru interpreter says doing much better p proair but shows very little capacity to use the saba delivery methold and does not have neb.  rec Add Breo one puff each am.(you have 28 days of samples at this dose)  And continue spiriva one capsule each am for now Continue prednsine 10  mg one daily for now   . 04/01/2013 f/u ov/Charles [name] re: asthma/ sarcoid  Chief Complaint  Patient presents with  . Follow-up    Pt c/o food getting stuck in throat.  Denies cough, SOB.    Once a week feels food gets stuck in neck, better if swallows water. Not needing saba any more, still very poor mdi/dpi (see a/p) rec Keep working on perfecting inhaler technique - think about it like taking a drag off a cigarette. Try lowering prednisone dose to 10 mg one half daily  .05/13/2013 f/u ov/Charles [name] re: sarcoid/ pred down to 5 mg per day / ? ab Chief Complaint  Patient presents with  . Follow-up    Pt still c/o diff with swallowing.   hip stops before the breathing  - Charles [name]  fu  Best breathing he's had in years Some dry mouth and dysphagia rec Try off spiriva to see if your swallowing and stomach feel better and your breathing stays the same without the spiriva, and if so you don't need the spiriva  Please schedule a follow up visit in 3 months but call sooner if needed with cxr on return   08/08/2013 f/u ov/Charles [name] re: dysphagia no better p stretching, better if uses water  Chief Complaint  Patient presents with  . Follow-up    Pt states that breahting has improved since last OV. Pt c/o increased SOB after eating--difficulty catching breath "gasping" per family members.  sob uses saba every day or two otherwise good control with Breo one daily  >>change losartan to benicar     10/17/2013 f/u ov/Charles [name] re: ? Sob worse but definitely doing more  Chief Complaint  Patient presents with  . Follow-up    Pt states breathing is unchanged since the last visit.   missed am meds as in a hurry. Now no longer in w/c Off breo x over a week with no change in symptoms  >no changes / ok to leave off breo as not not convinced helping   01/13/2014 Follow up Sarcoid  Returns for  3 month follow up Sarcoid - Reports is doing well since last ov, no new complaints Needs flu shot today  Remains on prednisone 5mg  daily  rec  Continue on current  regimen (5 mg daily )  Flu shot today    05/13/2014 f/u ov/Charles [name] re: sarcoid/ chronically steroid dep  Chief Complaint  Patient presents with  . Follow-up    Pt c/o incr SOB and dryness in chest when breathing. Pt does not feel that Prednisone is working. Pt reports changing diet some since last OV.   maintained on 10 mg daily  No worse cough  "brought all meds"  =  No proair  Pt has assumed all med management and taking ppi incorrectly and pred 10 where it was one half Pt nor fm using the med calendar format rec Please remember to go to the x-ray department downstairs for your tests - we will call you with the results when they  are available. Only use your albuterol as a rescue medication to  [name] to double prednisone if breathing worse between now and the next visit Nexium 40 mg Take 30-60 min should be before first meal of the day  Please schedule a follow up office visit in 4 weeks, sooner if needed with pfts and bring inhaler with you    06/13/2014 f/u ov/Charles [name] re: copd GOLD II with reversiblity / did not bring spiriva with him / did bring  proair but hfa quite poor/ pred at 10 mg daily   Chief Complaint  Patient presents with  . Follow-up    PFT done today. Breathing has improved. No new co's today.     Still not taking ppi ac  >dulera rx  07/01/2014 Follow up COPD /Sarcoid  He is accompanied by interpreter and family members  Patient returns for a two-week follow-up and medication review We reviewed all his medications organize them into a medication count with patient education  he appears to be taking his medications correctly He says overall his breathing is improved  He was started on Dulera last visit  He has decreased cough, shortness of breath . He denies a flare of wheezing  or rescue inhaler use . Patient denies any chest pain, orthopnea, PND or leg swelling .  Current Medications, Allergies, Complete Past Medical History, Past Surgical History, Family History, and Social History were reviewed in [employer] record.  ROS  The following are not active complaints unless bolded sore throat, dysphagia, dental problems, itching, sneezing,  nasal congestion or excess/ purulent secretions, ear ache,   fever, chills, sweats, unintended wt loss, pleuritic or exertional cp, hemoptysis,  orthopnea pnd or leg swelling, presyncope, palpitations, heartburn, abdominal pain, anorexia, nausea, vomiting, diarrhea  or change in bowel or urinary habits, change in stools or urine, dysuria,hematuria,  rash, arthralgias, visual complaints, headache, numbness weakness or ataxia or problems with walking  or coordination,  change in mood/affect or memory.             Objective:   Physical Exam    amb bm  Deaf  [place]  walks with a cane.  08/08/2013  170 > 172 7/16 >  10/17/2013 168 >171 01/13/2014 > 05/13/2014  167>06/13/2014   172 >176 07/01/2014    Interpretor in room /Deaf along with 2 family   HEENT: nl dentition, turbinates, and orophanx. Nl external ear canals without cough reflex   NECK :  without JVD/Nodes/TM/ nl carotid upstrokes bilaterally   LUNGS: no acc muscle use, clear to A and P bilaterally without cough on insp or exp maneuvers   CV:  RRR  no s3 or murmur or increase in P2, no edema   ABD:  soft and nontender with nl excursion in the supine position. No bruits or organomegaly, bowel sounds nl  MS:  warm without deformities, calf tenderness, cyanosis or clubbing  SKIN: warm and dry without lesions        CXR PA and Lateral:   05/13/2014 :     I personally reviewed images and agree with radiology impression as follows:   Stable changes consistent with sarcoidosis. There is no active cardiopulmonary disease.      Assessment & Plan:

## 2014-07-10 NOTE — Addendum Note (Signed)
Addended by: Inge Rise on: 07/10/2014 02:42 PM   Modules accepted: Orders, Medications

## 2014-07-22 ENCOUNTER — Other Ambulatory Visit: Payer: Self-pay | Admitting: Gastroenterology

## 2014-07-28 NOTE — Telephone Encounter (Signed)
Open in error

## 2014-08-26 ENCOUNTER — Telehealth: Payer: Self-pay | Admitting: Neurology

## 2014-08-26 NOTE — Telephone Encounter (Signed)
Patient's mother calling stating that patient is in a lot of pain. She states he does not need Botox but pain management for a renal cyst. Please call mother back to discuss what options are available for him. Best call back is [phone].

## 2014-08-26 NOTE — Telephone Encounter (Signed)
Spoke to patient's mother - instructed her to call his PCP today for an appointment - may need a urology referral, if PCP feels it is appropriate.

## 2014-09-03 ENCOUNTER — Ambulatory Visit (INDEPENDENT_AMBULATORY_CARE_PROVIDER_SITE_OTHER): Payer: Medicare Other | Admitting: Gastroenterology

## 2014-09-03 ENCOUNTER — Encounter: Payer: Self-pay | Admitting: Gastroenterology

## 2014-09-03 ENCOUNTER — Other Ambulatory Visit: Payer: Self-pay

## 2014-09-03 VITALS — BP 166/95 | HR 93 | Temp 97.6°F | Ht 67.0 in | Wt 176.0 lb

## 2014-09-03 DIAGNOSIS — R1310 Dysphagia, unspecified: Secondary | ICD-10-CM

## 2014-09-03 DIAGNOSIS — R1314 Dysphagia, pharyngoesophageal phase: Secondary | ICD-10-CM
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
## 2014-09-03 NOTE — Patient Instructions (Signed)
MODIFIED BARIUM SWALLOW FOLLOWED BY BARIUM PILL ESOPHAGRAM NEXT WEEK.   FOLLOW A SOFT MECHANICAL DIET UNTIL YOUR EVALUATION IS COMPLETE.  MEATS SHOULD BE CHOPPED OR GROUND ONLY. DO NOT EAT CHUNKS OF ANYTHING. SEE INFO BELOW.  FOLLOW UP IN 4 MOS.   SOFT MECHANICAL DIET This SOFT MECHANICAL DIET is restricted to:  Foods that are moist, soft-textured, and easy to chew and swallow.   Meats that are ground or are minced no larger than one-quarter inch pieces. Meats are moist with gravy or sauce added.   Foods that do not include bread or bread-like textures except soft pancakes, well-moistened with syrup or sauce.   Textures with some chewing ability required.   Casseroles without rice.   Cooked vegetables that are less than half an inch in size and easily mashed with a fork. No cooked corn, peas, broccoli, cauliflower, cabbage, Brussels sprouts, asparagus, or other fibrous, non-tender or rubbery cooked vegetables.   Canned fruit except for pineapple. Fruit must be cut into pieces no larger than half an inch in size.   Foods that do not include nuts, seeds, coconut, or sticky textures.   FOOD TEXTURES FOR DYSPHAGIA DIET LEVEL 2 -SOFT MECHANICAL DIET (includes all foods on Dysphagia Diet Level 1 - Pureed, in addition to the foods listed below)  FOOD GROUP: Breads. RECOMMENDED: Soft pancakes, well-moistened with syrup or sauce.  AVOID: All others.  FOOD GROUP: Cereals.  RECOMMENDED: Cooked cereals with little texture, including oatmeal. Unprocessed wheat bran stirred into cereals for bulk. Note: If thin liquids are restricted, it is important that all of the liquid is absorbed into the cereal.  AVOID: All dry cereals and any cooked cereals that may contain flax seeds or other seeds or nuts. Whole-grain, dry, or coarse cereals. Cereals with nuts, seeds, dried fruit, and/or coconut.  FOOD GROUP: Desserts. RECOMMENDED: Pudding, custard. Soft fruit pies with bottom crust only. Canned  fruit (excluding pineapple). Soft, moist cakes with icing.Frozen malts, milk shakes, frozen yogurt, eggnog, nutritional supplements, ice cream, sherbet, regular or sugar-free gelatin, or any foods that become thin liquid at either room (70 F) or body temperature (98 F).  AVOID: Dry, coarse cakes and cookies. Anything with nuts, seeds, coconut, pineapple, or dried fruit. Breakfast yogurt with nuts. Rice or bread pudding.  FOOD GROUP: Fats. RECOMMENDED: Butter, margarine, cream for cereal (depending on liquid consistency recommendations), gravy, cream sauces, sour cream, sour cream dips with soft additives, mayonnaise, salad dressings, cream cheese, cream cheese spreads with soft additives, whipped toppings.  AVOID: All fats with coarse or chunky additives.  FOOD GROUP: Fruits. RECOMMENDED: Soft drained, canned, or cooked fruits without seeds or skin. Fresh soft and ripe banana. Fruit juices with a small amount of pulp. If thin liquids are restricted, fruit juices should be thickened to appropriate consistency.  AVOID: Fresh or frozen fruits. Cooked fruit with skin or seeds. Dried fruits. Fresh, canned, or cooked pineapple.  FOOD GROUP: Meats and Meat Substitutes. (Meat pieces should not exceed 1/4 of an inch cube and should be tender.) RECOMMENDED: Moistened ground or cooked meat, poultry, or fish. Moist ground or tender meat may be served with gravy or sauce. Casseroles without rice. Moist macaroni and cheese, well-cooked pasta with meat sauce, tuna noodle casserole, soft, moist lasagna. Moist meatballs, meatloaf, or fish loaf. Protein salads, such as tuna or egg without large chunks, celery, or onion. Cottage cheese, smooth quiche without large chunks. Poached, scrambled, or soft-cooked eggs (egg yolks should not be "runny" but should be  moist and able to be mashed with butter, margarine, or other moisture added to them). (Cook eggs to 160 F or use pasteurized eggs for safety.) Souffls may  have small, soft chunks. Tofu. Well-cooked, slightly mashed, moist legumes, such as baked beans. All meats or protein substitutes should be served with sauces or moistened to help maintain cohesiveness in the oral cavity.  AVOID: Dry meats, tough meats (such as bacon, sausage, hot dogs, bratwurst). Dry casseroles or casseroles with rice or large chunks. Peanut butter. Cheese slices and cubes. Hard-cooked or crisp fried eggs. Sandwiches.Pizza.  FOOD GROUP: Potatoes and Starches. RECOMMENDED: Well-cooked, moistened, boiled, baked, or mashed potatoes. Well-cooked shredded hash brown potatoes that are not crisp. (All potatoes need to be moist and in sauces.)Well-cooked noodles in sauce. Spaetzel or soft dumplings that have been moistened with butter or gravy.  AVOID: Potato skins and chips. Fried or French-fried potatoes. Rice.  FOOD GROUP: Soups. RECOMMENDED: Soups with easy-to-chew or easy-to-swallow meats or vegetables: Particle sizes in soups should be less than 1/2 inch. Soups will need to be thickened to appropriate consistency if soup is thinner than prescribed liquid consistency.  AVOID: Soups with large chunks of meat and vegetables. Soups with rice, corn, peas.  FOOD GROUP: Vegetables. RECOMMENDED: All soft, well-cooked vegetables. Vegetables should be less than a half inch. Should be easily mashed with a fork.  AVOID: Cooked corn and peas. Broccoli, cabbage, Brussels sprouts, asparagus, or other fibrous, non-tender or rubbery cooked vegetables.  FOOD GROUP: Miscellaneous. RECOMMENDED: Jams and preserves without seeds, jelly. Sauces, salsas, etc., that may have small tender chunks less than 1/2 inch. Soft, smooth chocolate bars that are easily chewed.  AVOID: Seeds, nuts, coconut, or sticky foods. Chewy candies such as caramels or licorice.

## 2014-09-03 NOTE — Assessment & Plan Note (Signed)
SYMPTOMS NOT IDEALLY CONTROLLED and be due to dysfunction of hypopharynx, UES, OR ESOPHAGEAL MOTILITY DISORDER, LESS LIKELY ESOPHAGEAL WEB/STRICTURE.  MBS FOLLOWED BY BPE NEXT WEEK. MOTHER REQUESTS NO APPTS ON MON. SOFT MECHANICAL DIET FOLLOW UP IN 4 MOS.

## 2014-09-08 NOTE — Progress Notes (Signed)
CC'ED TO PCP 

## 2014-09-16 ENCOUNTER — Other Ambulatory Visit: Payer: Self-pay | Admitting: Gastroenterology

## 2014-09-16 ENCOUNTER — Other Ambulatory Visit: Payer: Self-pay

## 2014-09-16 DIAGNOSIS — R1314 Dysphagia, pharyngoesophageal phase: Secondary | ICD-10-CM

## 2014-09-16 DIAGNOSIS — R1310 Dysphagia, unspecified: Secondary | ICD-10-CM

## 2014-09-23 ENCOUNTER — Ambulatory Visit (HOSPITAL_COMMUNITY): Payer: Medicare Other | Attending: Gastroenterology | Admitting: Speech Pathology

## 2014-09-23 ENCOUNTER — Ambulatory Visit (HOSPITAL_COMMUNITY)
Admission: RE | Admit: 2014-09-23 | Discharge: 2014-09-23 | Disposition: A | Discharge status: Home / Self Care | Payer: Medicare Other | Source: Ambulatory Visit | Attending: Gastroenterology | Admitting: Gastroenterology

## 2014-09-23 DIAGNOSIS — K224 Dyskinesia of esophagus: Secondary | ICD-10-CM | POA: Insufficient documentation

## 2014-09-23 DIAGNOSIS — I10 Essential (primary) hypertension: Secondary | ICD-10-CM | POA: Diagnosis not present

## 2014-09-23 DIAGNOSIS — E119 Type 2 diabetes mellitus without complications: Secondary | ICD-10-CM | POA: Insufficient documentation

## 2014-09-23 DIAGNOSIS — R1314 Dysphagia, pharyngoesophageal phase: Secondary | ICD-10-CM

## 2014-09-23 DIAGNOSIS — Z8673 Personal history of transient ischemic attack (TIA), and cerebral infarction without residual deficits: Secondary | ICD-10-CM | POA: Diagnosis not present

## 2014-09-23 DIAGNOSIS — K219 Gastro-esophageal reflux disease without esophagitis: Secondary | ICD-10-CM | POA: Diagnosis not present

## 2014-09-23 DIAGNOSIS — D869 Sarcoidosis, unspecified: Secondary | ICD-10-CM | POA: Insufficient documentation

## 2014-09-23 DIAGNOSIS — R1310 Dysphagia, unspecified: Secondary | ICD-10-CM | POA: Diagnosis present

## 2014-09-23 DIAGNOSIS — J45909 Unspecified asthma, uncomplicated: Secondary | ICD-10-CM | POA: Insufficient documentation
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
## 2014-09-24 ENCOUNTER — Encounter (HOSPITAL_COMMUNITY): Payer: Medicare Other | Admitting: Speech Pathology

## 2014-09-29 ENCOUNTER — Ambulatory Visit (HOSPITAL_COMMUNITY): Payer: Medicare Other | Admitting: Speech Pathology

## 2014-09-29 ENCOUNTER — Other Ambulatory Visit (HOSPITAL_COMMUNITY): Payer: Medicare Other

## 2014-10-01 ENCOUNTER — Ambulatory Visit: Payer: Medicare Other | Admitting: Podiatry

## 2014-10-09 DIAGNOSIS — M461 Sacroiliitis, not elsewhere classified: Secondary | ICD-10-CM | POA: Insufficient documentation

## 2014-10-14 ENCOUNTER — Ambulatory Visit (INDEPENDENT_AMBULATORY_CARE_PROVIDER_SITE_OTHER): Payer: Medicare Other | Admitting: Podiatry

## 2014-10-14 ENCOUNTER — Encounter: Payer: Self-pay | Admitting: Podiatry

## 2014-10-14 DIAGNOSIS — B351 Tinea unguium: Secondary | ICD-10-CM

## 2014-10-14 DIAGNOSIS — M79609 Pain in unspecified limb: Principal | ICD-10-CM

## 2014-10-14 DIAGNOSIS — M79673 Pain in unspecified foot: Secondary | ICD-10-CM | POA: Diagnosis not present

## 2014-10-14 NOTE — Progress Notes (Signed)
Patient ID: Charles [name], male   DOB: 11-26-1955, 59 y.o.   MRN: [id number] Complaint:  Visit Type: Patient returns to my office for continued preventative foot care services. Complaint: Patient states" my nails have grown long and thick and become painful to walk and wear shoes" Patient has been diagnosed with DM with no foot complications. The patient presents for preventative foot care services. No changes to ROS  Podiatric Exam: Vascular: dorsalis pedis and posterior tibial pulses are palpable bilateral. Capillary return is immediate. Temperature gradient is WNL. Skin turgor WNL  Sensorium: Normal Semmes Weinstein monofilament test. Normal tactile sensation bilaterally. Nail Exam: Pt has thick disfigured discolored nails with subungual debris noted bilateral entire nail hallux  Ulcer Exam: There is no evidence of ulcer or pre-ulcerative changes or infection. Orthopedic Exam: Muscle tone and strength are WNL. No limitations in general ROM. No crepitus or effusions noted. Foot type and digits show no abnormalities. Bony prominences are unremarkable. Skin: No Porokeratosis. No infection or ulcers  Diagnosis:  Onychomycosis, , Pain in right toe, pain in left toes  Treatment & Plan Procedures and Treatment: Consent by patient was obtained for treatment procedures. The patient understood the discussion of treatment and procedures well. All questions were answered thoroughly reviewed. Debridement of mycotic and hypertrophic toenails, 1 through 5 bilateral and clearing of subungual debris. No ulceration, no infection noted.  Return Visit-Office Procedure: Patient instructed to return to the office for a follow up visit 3 months for continued evaluation and treatment.

## 2014-11-04 ENCOUNTER — Encounter: Payer: Self-pay | Admitting: Internal Medicine

## 2014-11-04 ENCOUNTER — Ambulatory Visit (INDEPENDENT_AMBULATORY_CARE_PROVIDER_SITE_OTHER): Payer: Medicare Other | Admitting: Internal Medicine

## 2014-11-04 VITALS — BP 136/80 | HR 74 | Ht 65.0 in | Wt 177.0 lb

## 2014-11-04 DIAGNOSIS — J449 Chronic obstructive pulmonary disease, unspecified: Secondary | ICD-10-CM | POA: Diagnosis not present

## 2014-11-04 DIAGNOSIS — D869 Sarcoidosis, unspecified: Secondary | ICD-10-CM | POA: Diagnosis not present

## 2014-11-04 NOTE — Patient Instructions (Signed)
No change in medications  Work on inhaler technique:  relax and gently blow all the way out then take a nice smooth deep breath back in, triggering the inhaler at same time you start breathing in.  Hold for up to 5 seconds if you can. Blow out thru nose. Rinse and gargle with water when done.  You will need to let your family listen to the way you inhale to be sure your are doing it effectively   Please schedule a follow up visit in 3 months but call sooner if needed with all inhalers in hand

## 2014-11-04 NOTE — Progress Notes (Signed)
Subjective:   Patient ID: Charles [name], male    DOB: 1955-08-08    MRN: [id number]  Brief patient profile:  59 yowm dx quit smoking in 1994 first aware of sob age 59 referred to pulmonary clinic 10/11/12 with dx of sarcoid and atypical sob ? acei effect extremely difficult to maintain med reconciliation as he is both deaf and very stubborn re taking care of his own meds    History of Present Illness   [name] -Interpretor  Hx of CVA w/ hemiplegia   10/11/2012  1st Charles [name] pulmonary eval cc short of breath ? When did you first notice it ?  Mother "well the doctor's discovered" it dx with sarcoid in [place] and placed on prednisone since decades and maintained on prednisone and prinizil.  He was able to play sports in high school but mother says ?always needed advair (note advair wasn't made until he was 64) "well he was always on some pump" (interesting comment as he showed no capacity at all to use hfa).  Thru the interpreter after mutliple back and forths and asking the mother to let him answer the question, he has been variably sob since age 59, sometimes can't get across the room s giving out but never at rest, never sleeping, assoc with dry cough ? Since when  > on ACEi  Dx and rx as sarcoid by Charles [name] pulmonary doc on pred 10 mg per day but not clear whether ever changed his breathing for the better and so maint on pred 10 mg per day  >>D/C acei and advair    10/25/2012 Follow up and Med review (Interpretor and family present for visit)  Patient returns for a two-week followup and medication review. We reviewed all his medications and organized them into medication calendar with patient education. ACE and Advair stopped last ov . No flare in cough or dyspnea. Uses ProAir most days in morning.  Remains on prednisone 10mg  daily  Appears to be taking meds correctly . Brother gives him his meds.   rec Follow med calendar closely and bring to each visit.  Use ProAir Inhaler only as  needed for wheezing /shortness of breath    11/19/2012 f/u ov/Charles [name] re: sarcoid/asthma/copd  On pred 10 mg daily did not bring calendar not apparently using it Chief Complaint  Patient presents with  . Follow-up    pt reports breathing is about the same since last OV, breathing is "different and harder " per mother patients has increased wheezing --  will be having surgery soon for tooth extraction  sob x 75 ft per brother Brother confused with med instructions re saba  despite calendar, says doe x across the room every time he tries. Noct spells of choking ever since stroke but swallowing ok. rec Try reducing prednisone to one half daily  Add pepcid 20 mg one at bedtime Add Breo one puff each am See calendar for specific medication    04/01/2013 f/u ov/Charles [name] re: asthma/ sarcoid  Chief Complaint  Patient presents with  . Follow-up    Pt c/o food getting stuck in throat.  Denies cough, SOB.    Once a week feels food gets stuck in neck, better if swallows water. Not needing saba any more, still very poor mdi/dpi (see a/p) rec Keep working on perfecting inhaler technique - think about it like taking a drag off a cigarette. Try lowering prednisone dose to 10 mg one half daily  .05/13/2013 f/u ov/Charles [name] re: sarcoid/ pred down  to 5 mg per day / ? ab Chief Complaint  Patient presents with  . Follow-up    Pt still c/o diff with swallowing.   hip stops before the breathing  - Charles [name] fu  Best breathing he's had in years Some dry mouth and dysphagia rec Try off spiriva to see if your swallowing and stomach feel better and your breathing stays the same without the spiriva, and if so you don't need the spiriva  Please schedule a follow up visit in 3 months but call sooner if needed with cxr on return   08/08/2013 f/u ov/Charles [name] re: dysphagia no better p stretching, better if uses water  Chief Complaint  Patient presents with  . Follow-up    Pt states that breahting has improved since last OV.  Pt c/o increased SOB after eating--difficulty catching breath "gasping" per family members.  sob uses saba every day or two otherwise good control with Breo one daily  >>change losartan to benicar     10/17/2013 f/u ov/Charles [name] re: ? Sob worse but definitely doing more  Chief Complaint  Patient presents with  . Follow-up    Pt states breathing is unchanged since the last visit.   missed am meds as in a hurry. Now no longer in w/c Off breo x over a week with no change in symptoms  >no changes / ok to leave off breo as not not convinced helping   01/13/2014 Follow up Sarcoid  Returns for  3 month follow up Sarcoid - Reports is doing well since last ov, no new complaints Needs flu shot today  Remains on prednisone 5mg  daily  rec  Continue on current  regimen (5 mg daily )  Flu shot today    05/13/2014 f/u ov/Charles [name] re: sarcoid/ chronically steroid dep  Chief Complaint  Patient presents with  . Follow-up    Pt c/o incr SOB and dryness in chest when breathing. Pt does not feel that Prednisone is working. Pt reports changing diet some since last OV.   maintained on 10 mg daily  No worse cough  "brought all meds"  =  No proair  Pt has assumed all med management and taking ppi incorrectly and pred 10 where it was one half Pt nor fm using the med calendar format rec Please remember to go to the x-ray department downstairs for your tests - we will call you with the results when they are available. Only use your albuterol as a rescue medication to  [facility] to double prednisone if breathing worse between now and the next visit Nexium 40 mg Take 30-60 min should be before first meal of the day     06/13/2014 f/u ov/Charles [name] re: copd GOLD II with reversiblity / did not bring spiriva with him / did bring proair but hfa quite poor/ pred at 10 mg daily   Chief Complaint  Patient presents with  . Follow-up    PFT done today. Breathing has improved. No new co's today.     Still not taking ppi ac   >[id number]  rx  07/01/2014 Follow up COPD /Sarcoid  He is accompanied by interpreter and family members  Patient returns for a two-week follow-up and medication review We reviewed all his medications organize them into a medication count with patient education  he appears to be taking his medications correctly He says overall his breathing is improved  He was started on Dulera last visit  He has decreased cough, shortness of breath . He denies  a flare of wheezing  or rescue inhaler use  rec No change rx   11/04/2014 f/u ov/Charles [name] re: COPD Gold II with reversibility/ pred at 10 mg daily/ dulera on 0 didn't know it. Chief Complaint  Patient presents with  . Follow-up    Pt states that his breathing is unchanged since the last visit. He is using rescue inhaler   limited more by back than breathing' very poor hfa/ dulera completely empty, pt did not know when the counter went to 0  No obvious day to day or daytime variability or assoc chronic cough or cp or chest tightness, subjective wheeze or overt sinus or hb symptoms. No unusual exp hx or h/o childhood pna/ asthma or knowledge of premature birth.  Sleeping ok without nocturnal  or early am exacerbation  of respiratory  c/o's or need for noct saba. Also denies any obvious fluctuation of symptoms with weather or environmental changes or other aggravating or alleviating factors except as outlined above   Current Medications, Allergies, Complete Past Medical History, Past Surgical History, Family History, and Social History were reviewed in [employer] record.  ROS  The following are not active complaints unless bolded sore throat, dysphagia, dental problems, itching, sneezing,  nasal congestion or excess/ purulent secretions, ear ache,   fever, chills, sweats, unintended wt loss, classically pleuritic or exertional cp, hemoptysis,  orthopnea pnd or leg swelling, presyncope, palpitations, abdominal pain, anorexia,  nausea, vomiting, diarrhea  or change in bowel or bladder habits, change in stools or urine, dysuria,hematuria,  rash, arthralgias, visual complaints, headache, numbness, weakness or ataxia or problems with walking or coordination walks with cane very slowly at home,  change in mood/affect or memory.                  Objective:   Physical Exam  amb bm  Deaf  [place]   W/c bound today due to chronic  back pains   08/08/2013  170 > 172 7/16 >  10/17/2013 168 >171 01/13/2014 > 05/13/2014  167>06/13/2014   172 >176 07/01/2014 > 11/04/2014 177    Interpretor in room /Deaf   HEENT: nl dentition, turbinates, and orophanx. Nl external ear canals without cough reflex   NECK :  without JVD/Nodes/TM/ nl carotid upstrokes bilaterally   LUNGS: no acc muscle use, clear to A and P bilaterally without cough on insp or exp maneuvers   CV:  RRR  no s3 or murmur or increase in P2, no edema   ABD:  soft and nontender with nl excursion in the supine position. No bruits or organomegaly, bowel sounds nl  MS:  warm without deformities, calf tenderness, cyanosis or clubbing  SKIN: warm and dry without lesions        CXR PA and Lateral:   05/13/2014 :     I personally reviewed images and agree with radiology impression as follows:   Stable changes consistent with sarcoidosis. There is no active cardiopulmonary disease.      Assessment & Plan:

## 2014-11-05 ENCOUNTER — Encounter: Payer: Self-pay | Admitting: Internal Medicine

## 2014-11-05 NOTE — Assessment & Plan Note (Signed)
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  The goal with a chronic steroid dependent illness is always arriving at the lowest effective dose that controls the disease/symptoms and not accepting a set "formula" which is based on statistics or guidelines that don't always take into account patient  variability or the natural hx of the dz in every individual patient, which may well vary over time.  For now therefore I recommend the patient maintain  A floor of 10 mg daily for now

## 2014-11-05 NOTE — Assessment & Plan Note (Signed)
-   ex intol since age 59 - trial off acei 10/12/2012  - 11/19/2012   Walked RA x one lap @ 185 stopped due to  Legs gave out no desat  - trial of breo samples 11/19/2012 >> improved on breo/spiriva 04/01/2013 > try off spiriva 05/14/2013 due to dry mouth> no worse 08/09/2013  - Spirometry 11/19/2012  FEV1  1.29 (49) ratio 55  - Spirometry 10/17/2013   FEV1  1.17 (44%) ratio 55 off breo x one week and prednisone at 5 mg daily > d/c BREO as pt not convinced help - 10/17/2013   Walked RA x one lap @ 185 stopped due to fatigue not sob  - PFTs  06/13/2014  FEV1 1.47(60) ratio 53 with 30% improvement   dlco 65 improves to 109 p alv vol while on pred 10 mg daily  - 06/13/2014 p extensive coaching HFA effectiveness =    50% so try dulera 200 2 twice daily    - 11/04/2014  hfa < 25 % but pt doing well on dulera 200 so rec continue for now   Despite documented non-adherence seems to be doing ok on present complex rx  I had an extended discussion with the patient thru interpreter  reviewing all relevant studies completed to date and  lasting 15 to 20 minutes of a 25 minute visit      Each maintenance medication was reviewed in detail including most importantly the difference between maintenance and as needed and under what circumstances the prns are to be used. This was done in the context of a medication calendar review which provided the patient with a user-friendly unambiguous mechanism for medication administration and reconciliation and provides an action plan for all active problems. It is critical that this be shown to every doctor  for modification during the office visit if necessary so the patient can use it as a working document.      [name]

## 2014-11-12 ENCOUNTER — Ambulatory Visit (INDEPENDENT_AMBULATORY_CARE_PROVIDER_SITE_OTHER): Payer: Medicare Other | Admitting: Cardiology

## 2014-11-12 ENCOUNTER — Encounter: Payer: Self-pay | Admitting: Cardiology

## 2014-11-12 VITALS — BP 122/85 | HR 58 | Ht 64.0 in | Wt 175.1 lb

## 2014-11-12 DIAGNOSIS — Z8673 Personal history of transient ischemic attack (TIA), and cerebral infarction without residual deficits: Secondary | ICD-10-CM

## 2014-11-12 DIAGNOSIS — R0789 Other chest pain: Secondary | ICD-10-CM

## 2014-11-12 DIAGNOSIS — I10 Essential (primary) hypertension: Secondary | ICD-10-CM | POA: Diagnosis not present

## 2014-11-12 DIAGNOSIS — D869 Sarcoidosis, unspecified: Secondary | ICD-10-CM

## 2014-11-12 NOTE — Progress Notes (Signed)
Cardiology Office Note  Date: 11/12/2014   ID: Charles, [name] 1955-11-01, MRN [id number]  PCP: [name], MD  Primary Cardiologist: Rozann Lesches, MD   Chief Complaint  Patient presents with  . History of atypical chest pain    History of Present Illness: Charles [name] is a 59 y.o. male seen in consultation back in June 2015 with atypical chest pain that sounded gastrointestinal in etiology. He had undergone reassuring ischemic workup via Cardiolite in August 2014 and had evidence of normal LVEF as well. No further cardiac testing was planned at that time. He comes in today with his mother (also a patient of mine) for a follow-up visit. We conducted the interview with the assistance of a sign language interpreter. He reports no exertional chest pain, states that his breathing has been stable, NYHA class II dyspnea. He uses inhalers as outlined below and has had no interval hospitalizations.  He continues to follow in the pulmonary division with Dr. Melvyn Novas for management of COPD and sarcoidosis. I reviewed the recent notes.  Follow-up ECG today shows sinus rhythm with increased voltage, otherwise normal.   Past Medical History  Diagnosis Date  . Type 2 diabetes mellitus   . Essential hypertension, benign   . Sarcoidosis   . GERD (gastroesophageal reflux disease)   . Asthma   . Hearing impaired   . PAD (peripheral artery disease)   . Mixed hyperlipidemia   . History of stroke     Spastic hemiplegia, right MCA stroke April 2014 in [place]    Current Outpatient Prescriptions  Medication Sig Dispense Refill  . acetaminophen (TYLENOL) 500 MG tablet Take 1,000 mg by mouth every 8 (eight) hours as needed for pain. Per bottle as needed for pain    . albuterol (PROVENTIL HFA;VENTOLIN HFA) 108 (90 BASE) MCG/ACT inhaler Inhale 2 puffs into the lungs every 4 (four) hours as needed for wheezing or shortness of breath. 1 Inhaler 3  . aspirin 325 MG EC tablet Take 325 mg by  mouth daily.    [name] atorvastatin (LIPITOR) 10 MG tablet Take 10 mg by mouth daily. (cholesterol)    . BENICAR 20 MG tablet take 1 tablet by mouth once daily 30 tablet 0  . clopidogrel (PLAVIX) 75 MG tablet Take 75 mg by mouth daily.    . mometasone-formoterol (DULERA) 200-5 MCG/ACT AERO Take 2 puffs first thing in am and then another 2 puffs about 12 hours later.    [name] NEXIUM 40 MG capsule take 1 capsule by mouth ----30 MINUTES PRIOR TO MEALS TWICE A DAY 60 capsule 11  . polyethylene glycol powder (GLYCOLAX/MIRALAX) powder Take 17 g by mouth at bedtime.     . predniSONE (DELTASONE) 10 MG tablet Take 10 mg by mouth every morning.     . sitaGLIPtin-metformin (JANUMET) 50-500 MG per tablet Take 1 tablet by mouth 2 (two) times daily with a meal.    . SPIRIVA HANDIHALER 18 MCG inhalation capsule Place 1 capsule into inhaler and inhale daily.  0  . traMADol (ULTRAM) 50 MG tablet Take 1 tablet by mouth 2 (two) times daily as needed.  0   Current Facility-Administered Medications  Medication Dose Route Frequency Provider Last Rate Last Dose  . botulinum toxin Type A (BOTOX) injection 300 Units  300 Units Intramuscular Once Drema Dallas, DO        Allergies:  Contrast media and Shellfish allergy   Social History: The patient  reports that he quit smoking  about 22 years ago. His smoking use included Cigarettes. He has a 29 pack-year smoking history. He has never used smokeless tobacco. He reports that he does not drink alcohol or use illicit drugs.   ROS:  Please see the history of present illness. Otherwise, complete review of systems is positive for staple spastic hemiplegia.  All other systems are reviewed and negative.   Physical Exam: VS:  BP 122/85 mmHg  Pulse 58  Ht 5\' 4"  (1.626 m)  Wt 175 lb 1.9 oz (79.434 kg)  BMI 30.04 kg/m2  SpO2 96%, BMI Body mass index is 30.04 kg/(m^2).  Wt Readings from Last 3 Encounters:  11/12/14 175 lb 1.9 oz (79.434 kg)  11/04/14 177 lb (80.287 kg)  09/03/14  176 lb (79.833 kg)     Appears comfortable at rest. HEENT: Conjunctiva and lids normal, oropharynx clear. Neck: Supple, no elevated JVP or carotid bruits, no thyromegaly. Lungs: Clear to auscultation, nonlabored breathing at rest. Cardiac: Regular rate and rhythm, no S3, soft systolic murmur, no pericardial rub. Abdomen: Soft, nontender, bowel sounds present, no guarding or rebound. Extremities: No pitting edema, distal pulses 2+. Skin: Warm and dry. Musculoskeletal: No kyphosis. Neuropsychiatric: Alert and oriented x3, congenital deafness, affect grossly appropriate. Left hemiparesis.   ECG: ECG is ordered today.   Assessment and Plan:  1. No obvious angina symptoms, ECG reviewed. He has a history of reflux and granulomatous gastritis followed by GI. Prior atypical chest pain is most likely gastrointestinal in etiology. He is on a proton pump inhibitor.  2. History of stroke with left-sided hemiparesis.  3. COPD and sarcoidosis, followed by Dr. Melvyn Novas.  Current medicines were reviewed with the patient today.   Orders Placed This Encounter  Procedures  . EKG 12-Lead    Disposition: FU with me in 1 year.   Signed, Satira Sark, MD, [facility] 11/12/2014 10:38 AM    [facility] [place], [place], [place] [address] Phone: [phone]; Fax: [phone]

## 2014-11-12 NOTE — Patient Instructions (Signed)

## 2014-11-13 ENCOUNTER — Telehealth: Payer: Self-pay | Admitting: Internal Medicine

## 2014-11-13 MED ORDER — MOMETASONE FURO-FORMOTEROL FUM 200-5 MCG/ACT IN AERO: INHALATION_SPRAY | RESPIRATORY_TRACT | Status: DC

## 2014-11-13 NOTE — Telephone Encounter (Signed)
EC informed rx sent to pharmacy. Nothing further needed.

## 2014-12-02 ENCOUNTER — Encounter: Payer: Self-pay | Admitting: Gastroenterology

## 2015-01-13 ENCOUNTER — Ambulatory Visit: Payer: Medicare Other | Admitting: Podiatry

## 2015-02-03 ENCOUNTER — Encounter: Payer: Self-pay | Admitting: Internal Medicine

## 2015-02-03 ENCOUNTER — Ambulatory Visit (INDEPENDENT_AMBULATORY_CARE_PROVIDER_SITE_OTHER): Payer: Medicare Other | Admitting: Internal Medicine

## 2015-02-03 VITALS — BP 132/82 | HR 71 | Ht 64.0 in | Wt 179.2 lb

## 2015-02-03 DIAGNOSIS — Z23 Encounter for immunization: Secondary | ICD-10-CM | POA: Diagnosis not present

## 2015-02-03 DIAGNOSIS — J449 Chronic obstructive pulmonary disease, unspecified: Secondary | ICD-10-CM | POA: Diagnosis not present

## 2015-02-03 DIAGNOSIS — D869 Sarcoidosis, unspecified: Secondary | ICD-10-CM | POA: Diagnosis not present

## 2015-02-03 NOTE — Progress Notes (Signed)
Subjective:   Patient ID: Charles [name], male    DOB: November 06, 1955    MRN: [id number]  Brief patient profile:  33 yowm dx  Active smoker first aware of sob age 59 referred to pulmonary clinic 10/11/12 with dx of sarcoid and atypical sob ? acei effect extremely difficult to maintain med reconciliation as he is both deaf and very stubborn re taking care of his own meds    History of Present Illness   Deaf [name] -Interpretor  Hx of CVA w/ hemiplegia   10/11/2012  1st [place] pulmonary eval cc short of breath ? When did you first notice it ?  Mother "well the doctor's discovered" it dx with sarcoid in [place] and placed on prednisone since decades and maintained on prednisone and prinizil.  He was able to play sports in high school but mother says ?always needed advair (note advair wasn't made until he was 75) "well he was always on some pump" (interesting comment as he showed no capacity at all to use hfa).  Thru the interpreter after mutliple back and forths and asking the mother to let him answer the question, he has been variably sob since age 22, sometimes can't get across the room s giving out but never at rest, never sleeping, assoc with dry cough ? Since when  > on ACEi  Dx and rx as sarcoid by [name] pulmonary doc on pred 10 mg per day but not clear whether ever changed his breathing for the better and so maint on pred 10 mg per day  >>D/C acei and advair    05/13/2014 f/u ov/Sarahgrace Broman re: sarcoid/ chronically steroid dep  Chief Complaint  Patient presents with  . Follow-up    Pt c/o incr SOB and dryness in chest when breathing. Pt does not feel that Prednisone is working. Pt reports changing diet some since last OV.   maintained on 10 mg daily  No worse cough  "brought all meds"  =  No proair  Pt has assumed all med management and taking ppi incorrectly and pred 10 where it was one half Pt nor fm using the med calendar format rec Please remember to go to the x-ray department downstairs  for your tests - we will call you with the results when they are available. Only use your albuterol as a rescue medication to  [facility] to double prednisone if breathing worse between now and the next visit Nexium 40 mg Take 30-60 min should be before first meal of the day     06/13/2014 f/u ov/Eowyn Tabone re: copd GOLD II with reversiblity / did not bring spiriva with him / did bring proair but hfa quite poor/ pred at 10 mg daily   Chief Complaint  Patient presents with  . Follow-up    PFT done today. Breathing has improved. No new co's today.     Still not taking ppi ac  >[id number]  rx    11/04/2014 f/u ov/Lillyonna Armstead re: COPD Gold II with reversibility/ pred at 10 mg daily/ dulera on 0 didn't know it. Chief Complaint  Patient presents with  . Follow-up    Pt states that his breathing is unchanged since the last visit. He is using rescue inhaler   limited more by back than breathing' very poor hfa/ dulera completely empty, pt did not know when the counter went to 0 rec No change in medications Work on inhaler technique:    You will need to let your family listen to the way you  inhale to be sure your are doing it effectively  Please schedule a follow up visit in 3 months but call sooner if needed with all inhalers in hand     02/03/2015  f/u ov/Camesha Farooq re: GOLD II/ sarcoid rx =  prednisone 10 / dulera but verypoor hfa - did not bring all inhalers as rec/ pred at 10 mg daily  Chief Complaint  Patient presents with  . Follow-up    Pt states that he has been smoking about once a month and recently did have some SOB afterward that did improve with albuterol HFA use. Pt denies any current cough/wheeze/SOB/CP/tightness. Pt states he does use his inhalers as directed.    very confusing again as to exactly what he takes. I don't believe he's been using Spiriva at all. On the other hand he actually looks pretty good and is really not limited by his breathing at this point.   No obvious day to day or daytime  variability or assoc chronic cough or cp or chest tightness, subjective wheeze or overt sinus or hb symptoms. No unusual exp hx or h/o childhood pna/ asthma or knowledge of premature birth.  Sleeping ok without nocturnal  or early am exacerbation  of respiratory  c/o's or need for noct saba. Also denies any obvious fluctuation of symptoms with weather or environmental changes or other aggravating or alleviating factors except as outlined above   Current Medications, Allergies, Complete Past Medical History, Past Surgical History, Family History, and Social History were reviewed in [employer] record.  ROS  The following are not active complaints unless bolded sore throat, dysphagia, dental problems, itching, sneezing,  nasal congestion or excess/ purulent secretions, ear ache,   fever, chills, sweats, unintended wt loss, classically pleuritic or exertional cp, hemoptysis,  orthopnea pnd or leg swelling, presyncope, palpitations, abdominal pain, anorexia, nausea, vomiting, diarrhea  or change in bowel or bladder habits, change in stools or urine, dysuria,hematuria,  rash, arthralgias, visual complaints, headache, numbness, weakness or ataxia or problems with walking or coordination walks with cane very slowly at home,  change in mood/affect or memory.                  Objective:   Physical Exam  amb bm  Deaf  [facility]      08/08/2013  170 > 172 7/16 >  10/17/2013 168 >171 01/13/2014 > 05/13/2014  167>06/13/2014   172 >176 07/01/2014 > 11/04/2014 177 > .02/04/2015 179    Interpretor in room /Deaf   HEENT: nl dentition, turbinates, and orophanx. Nl external ear canals without cough reflex   NECK :  without JVD/Nodes/TM/ nl carotid upstrokes bilaterally   LUNGS: no acc muscle use, clear to A and P bilaterally without cough on insp or exp maneuvers   CV:  RRR  no s3 or murmur or increase in P2, no edema   ABD:  soft and nontender with nl excursion in the supine position.  No bruits or organomegaly, bowel sounds nl  MS:  warm without deformities, calf tenderness, cyanosis or clubbing  SKIN: warm and dry without lesions        CXR PA and Lateral:   05/13/2014 :     I personally reviewed images and agree with radiology impression as follows:   Stable changes consistent with sarcoidosis. There is no active cardiopulmonary disease.      Assessment & Plan:

## 2015-02-03 NOTE — Patient Instructions (Signed)
Ok to leave off spiriva if you don't think it's helping   Work on inhaler technique:  relax and gently blow all the way out then take a nice smooth deep breath back in, triggering the inhaler at same time you start breathing in.  Hold for up to 5 seconds if you can. Blow out thru nose. Rinse and gargle with water when done      Please schedule a follow up visit in 3 months but call sooner if needed

## 2015-02-04 ENCOUNTER — Encounter: Payer: Self-pay | Admitting: Internal Medicine

## 2015-02-04 NOTE — Assessment & Plan Note (Addendum)
-   ex intol since age 59 - trial off acei 10/12/2012  - 11/19/2012   Walked RA x one lap @ 185 stopped due to  Legs gave out no desat  - trial of breo samples 11/19/2012 >> improved on breo/spiriva 04/01/2013 > try off spiriva 05/14/2013 due to dry mouth> no worse 08/09/2013  - Spirometry 11/19/2012  FEV1  1.29 (49) ratio 55  - Spirometry 10/17/2013   FEV1  1.17 (44%) ratio 55 off breo x one week and prednisone at 5 mg daily > d/c BREO as pt not convinced help - 10/17/2013   Walked RA x one lap @ 185 stopped due to fatigue not sob  - PFTs  06/13/2014  FEV1 1.47(60) ratio 53 with 30% improvement   dlco 65 improves to 109 p alv vol while on pred 10 mg daily  - 06/13/2014 p extensive coaching HFA effectiveness =    50% so try dulera 200 2 twice daily    - 02/03/2015   hfa < 25 % but pt doing well on dulera 200 so rec continue for now   I continue to be concerned about the patient's inability to follow instructions including how to use inhalers correctly. If he continues to struggle with the technique and begins having flareups or more symptoms than now (which seems unlikely as long as he needs prednisone at 10 mg daily to control what there is chronic sarcoidosis but has not been recently proven) he will  need to be switched over to a LABA/ICS by nebulizer as the next logical step.  I had an extended discussion with the patient via interpreter  reviewing all relevant studies completed to date and  lasting 15 to 20 minutes of a 25 minute visit    Each maintenance medication was reviewed in detail including most importantly the difference between maintenance and prns and under what circumstances the prns are to be triggered using an action plan format that is not reflected in the computer generated alphabetically organized AVS.    Please see instructions for details which were reviewed in writing and the patient given a copy highlighting the part that I personally wrote and discussed at today's ov.

## 2015-02-04 NOTE — Assessment & Plan Note (Signed)
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  For now therefore I recommend the patient maintain  10 mg daily for now

## 2015-02-06 ENCOUNTER — Encounter: Payer: Self-pay | Admitting: Podiatry

## 2015-02-06 ENCOUNTER — Ambulatory Visit (INDEPENDENT_AMBULATORY_CARE_PROVIDER_SITE_OTHER): Payer: Medicare Other | Admitting: Podiatry

## 2015-02-06 DIAGNOSIS — M79609 Pain in unspecified limb: Principal | ICD-10-CM

## 2015-02-06 DIAGNOSIS — B351 Tinea unguium: Secondary | ICD-10-CM | POA: Diagnosis not present

## 2015-02-06 DIAGNOSIS — M79673 Pain in unspecified foot: Secondary | ICD-10-CM

## 2015-02-06 NOTE — Progress Notes (Signed)
Patient ID: Charles [name], male   DOB: 04/01/1955, 59 y.o.   MRN: [id number] Complaint:  Visit Type: Patient returns to my office for continued preventative foot care services. Complaint: Patient states" my nails have grown long and thick and become painful to walk and wear shoes" Patient has been diagnosed with DM with no foot complications. The patient presents for preventative foot care services. No changes to ROS  Podiatric Exam: Vascular: dorsalis pedis and posterior tibial pulses are palpable bilateral. Capillary return is immediate. Temperature gradient is WNL. Skin turgor WNL  Sensorium: Normal Semmes Weinstein monofilament test. Normal tactile sensation bilaterally. Nail Exam: Pt has thick disfigured discolored nails with subungual debris noted bilateral entire nail hallux  Ulcer Exam: There is no evidence of ulcer or pre-ulcerative changes or infection. Orthopedic Exam: Muscle tone and strength are WNL. No limitations in general ROM. No crepitus or effusions noted. Foot type and digits show no abnormalities. Bony prominences are unremarkable. Skin: No Porokeratosis. No infection or ulcers  Diagnosis:  Onychomycosis, , Pain in right toe, pain in left toes  Treatment & Plan Procedures and Treatment: Consent by patient was obtained for treatment procedures. The patient understood the discussion of treatment and procedures well. All questions were answered thoroughly reviewed. Debridement of mycotic and hypertrophic toenails, 1 through 5 bilateral and clearing of subungual debris. No ulceration, no infection noted.  Return Visit-Office Procedure: Patient instructed to return to the office for a follow up visit 3 months for continued evaluation and treatment. 

## 2015-02-27 DIAGNOSIS — M47816 Spondylosis without myelopathy or radiculopathy, lumbar region: Secondary | ICD-10-CM | POA: Diagnosis not present

## 2015-03-08 DIAGNOSIS — Z87891 Personal history of nicotine dependence: Secondary | ICD-10-CM | POA: Diagnosis not present

## 2015-03-08 DIAGNOSIS — E119 Type 2 diabetes mellitus without complications: Secondary | ICD-10-CM | POA: Diagnosis not present

## 2015-03-08 DIAGNOSIS — Z7902 Long term (current) use of antithrombotics/antiplatelets: Secondary | ICD-10-CM | POA: Diagnosis not present

## 2015-03-08 DIAGNOSIS — K5730 Diverticulosis of large intestine without perforation or abscess without bleeding: Secondary | ICD-10-CM | POA: Diagnosis not present

## 2015-03-08 DIAGNOSIS — R1012 Left upper quadrant pain: Secondary | ICD-10-CM | POA: Diagnosis not present

## 2015-03-08 DIAGNOSIS — H913 Deaf nonspeaking, not elsewhere classified: Secondary | ICD-10-CM | POA: Diagnosis not present

## 2015-03-08 DIAGNOSIS — R918 Other nonspecific abnormal finding of lung field: Secondary | ICD-10-CM | POA: Diagnosis not present

## 2015-03-08 DIAGNOSIS — E7800 Pure hypercholesterolemia, unspecified: Secondary | ICD-10-CM | POA: Diagnosis not present

## 2015-03-08 DIAGNOSIS — Z79899 Other long term (current) drug therapy: Secondary | ICD-10-CM | POA: Diagnosis not present

## 2015-03-08 DIAGNOSIS — R079 Chest pain, unspecified: Secondary | ICD-10-CM | POA: Diagnosis not present

## 2015-03-08 DIAGNOSIS — R109 Unspecified abdominal pain: Secondary | ICD-10-CM | POA: Diagnosis not present

## 2015-03-08 DIAGNOSIS — K5790 Diverticulosis of intestine, part unspecified, without perforation or abscess without bleeding: Secondary | ICD-10-CM | POA: Diagnosis not present

## 2015-03-08 DIAGNOSIS — I69354 Hemiplegia and hemiparesis following cerebral infarction affecting left non-dominant side: Secondary | ICD-10-CM | POA: Diagnosis not present

## 2015-03-08 DIAGNOSIS — I10 Essential (primary) hypertension: Secondary | ICD-10-CM | POA: Diagnosis not present

## 2015-03-18 DIAGNOSIS — Z6828 Body mass index (BMI) 28.0-28.9, adult: Secondary | ICD-10-CM | POA: Diagnosis not present

## 2015-03-18 DIAGNOSIS — E1165 Type 2 diabetes mellitus with hyperglycemia: Secondary | ICD-10-CM | POA: Diagnosis not present

## 2015-03-18 DIAGNOSIS — Z418 Encounter for other procedures for purposes other than remedying health state: Secondary | ICD-10-CM | POA: Diagnosis not present

## 2015-03-18 DIAGNOSIS — Z125 Encounter for screening for malignant neoplasm of prostate: Secondary | ICD-10-CM | POA: Diagnosis not present

## 2015-03-18 DIAGNOSIS — I10 Essential (primary) hypertension: Secondary | ICD-10-CM | POA: Diagnosis not present

## 2015-03-18 DIAGNOSIS — Z0000 Encounter for general adult medical examination without abnormal findings: Secondary | ICD-10-CM | POA: Diagnosis not present

## 2015-03-18 DIAGNOSIS — Z1389 Encounter for screening for other disorder: Secondary | ICD-10-CM | POA: Diagnosis not present

## 2015-03-18 DIAGNOSIS — R5383 Other fatigue: Secondary | ICD-10-CM | POA: Diagnosis not present

## 2015-03-18 DIAGNOSIS — Z1211 Encounter for screening for malignant neoplasm of colon: Secondary | ICD-10-CM | POA: Diagnosis not present

## 2015-04-01 ENCOUNTER — Ambulatory Visit: Payer: Medicare Other | Admitting: Gastroenterology

## 2015-04-02 DIAGNOSIS — Z789 Other specified health status: Secondary | ICD-10-CM | POA: Diagnosis not present

## 2015-04-02 DIAGNOSIS — M79602 Pain in left arm: Secondary | ICD-10-CM | POA: Diagnosis not present

## 2015-04-02 DIAGNOSIS — M79671 Pain in right foot: Secondary | ICD-10-CM | POA: Diagnosis not present

## 2015-04-02 DIAGNOSIS — R600 Localized edema: Secondary | ICD-10-CM | POA: Diagnosis not present

## 2015-04-02 DIAGNOSIS — Z6829 Body mass index (BMI) 29.0-29.9, adult: Secondary | ICD-10-CM | POA: Diagnosis not present

## 2015-04-02 DIAGNOSIS — M79605 Pain in left leg: Secondary | ICD-10-CM | POA: Diagnosis not present

## 2015-04-08 DIAGNOSIS — M79671 Pain in right foot: Secondary | ICD-10-CM | POA: Diagnosis not present

## 2015-04-08 DIAGNOSIS — M1990 Unspecified osteoarthritis, unspecified site: Secondary | ICD-10-CM | POA: Diagnosis not present

## 2015-04-08 DIAGNOSIS — R600 Localized edema: Secondary | ICD-10-CM | POA: Diagnosis not present

## 2015-04-08 DIAGNOSIS — I10 Essential (primary) hypertension: Secondary | ICD-10-CM | POA: Diagnosis not present

## 2015-04-15 ENCOUNTER — Emergency Department (HOSPITAL_COMMUNITY): Payer: Medicare Other

## 2015-04-15 ENCOUNTER — Emergency Department (HOSPITAL_COMMUNITY)
Admission: EM | Admit: 2015-04-15 | Discharge: 2015-04-15 | Disposition: A | Discharge status: Home / Self Care | Payer: Medicare Other | Attending: Emergency Medicine | Admitting: Emergency Medicine

## 2015-04-15 ENCOUNTER — Encounter (HOSPITAL_COMMUNITY): Payer: Self-pay | Admitting: Cardiology

## 2015-04-15 DIAGNOSIS — J45909 Unspecified asthma, uncomplicated: Secondary | ICD-10-CM | POA: Diagnosis not present

## 2015-04-15 DIAGNOSIS — R202 Paresthesia of skin: Secondary | ICD-10-CM

## 2015-04-15 DIAGNOSIS — I10 Essential (primary) hypertension: Secondary | ICD-10-CM | POA: Insufficient documentation

## 2015-04-15 DIAGNOSIS — Z7984 Long term (current) use of oral hypoglycemic drugs: Secondary | ICD-10-CM | POA: Diagnosis not present

## 2015-04-15 DIAGNOSIS — Z7982 Long term (current) use of aspirin: Secondary | ICD-10-CM | POA: Insufficient documentation

## 2015-04-15 DIAGNOSIS — Z7951 Long term (current) use of inhaled steroids: Secondary | ICD-10-CM | POA: Diagnosis not present

## 2015-04-15 DIAGNOSIS — M545 Low back pain: Secondary | ICD-10-CM | POA: Insufficient documentation

## 2015-04-15 DIAGNOSIS — E119 Type 2 diabetes mellitus without complications: Secondary | ICD-10-CM | POA: Diagnosis not present

## 2015-04-15 DIAGNOSIS — K219 Gastro-esophageal reflux disease without esophagitis: Secondary | ICD-10-CM | POA: Diagnosis not present

## 2015-04-15 DIAGNOSIS — Z7952 Long term (current) use of systemic steroids: Secondary | ICD-10-CM | POA: Insufficient documentation

## 2015-04-15 DIAGNOSIS — R531 Weakness: Secondary | ICD-10-CM | POA: Diagnosis not present

## 2015-04-15 DIAGNOSIS — E782 Mixed hyperlipidemia: Secondary | ICD-10-CM | POA: Diagnosis not present

## 2015-04-15 DIAGNOSIS — Z862 Personal history of diseases of the blood and blood-forming organs and certain disorders involving the immune mechanism: Secondary | ICD-10-CM | POA: Diagnosis not present

## 2015-04-15 DIAGNOSIS — G629 Polyneuropathy, unspecified: Secondary | ICD-10-CM | POA: Diagnosis not present

## 2015-04-15 DIAGNOSIS — F17210 Nicotine dependence, cigarettes, uncomplicated: Secondary | ICD-10-CM | POA: Diagnosis not present

## 2015-04-15 DIAGNOSIS — R200 Anesthesia of skin: Secondary | ICD-10-CM | POA: Diagnosis present

## 2015-04-15 DIAGNOSIS — Z7902 Long term (current) use of antithrombotics/antiplatelets: Secondary | ICD-10-CM | POA: Insufficient documentation

## 2015-04-15 DIAGNOSIS — Z8673 Personal history of transient ischemic attack (TIA), and cerebral infarction without residual deficits: Secondary | ICD-10-CM | POA: Insufficient documentation

## 2015-04-15 DIAGNOSIS — Z79899 Other long term (current) drug therapy: Secondary | ICD-10-CM | POA: Diagnosis not present

## 2015-04-15 DIAGNOSIS — R209 Unspecified disturbances of skin sensation: Secondary | ICD-10-CM | POA: Diagnosis not present

## 2015-04-15 LAB — CBC
HCT: 38.9 % — ABNORMAL LOW (ref 39.0–52.0)
HEMOGLOBIN: 13.6 g/dL (ref 13.0–17.0)
MCH: 31.1 pg (ref 26.0–34.0)
MCHC: 35 g/dL (ref 30.0–36.0)
MCV: 88.8 fL (ref 78.0–100.0)
PLATELETS: 138 10*3/uL — AB (ref 150–400)
RBC: 4.38 MIL/uL (ref 4.22–5.81)
RDW: 13.1 % (ref 11.5–15.5)
WBC: 4 10*3/uL (ref 4.0–10.5)

## 2015-04-15 LAB — PROTIME-INR
INR: 1 (ref 0.00–1.49)
PROTHROMBIN TIME: 13.4 s (ref 11.6–15.2)

## 2015-04-15 LAB — DIFFERENTIAL
Basophils Absolute: 0 10*3/uL (ref 0.0–0.1)
Basophils Relative: 1 %
Eosinophils Absolute: 0.2 10*3/uL (ref 0.0–0.7)
Eosinophils Relative: 4 %
LYMPHS PCT: 32 %
Lymphs Abs: 1.3 10*3/uL (ref 0.7–4.0)
Monocytes Absolute: 0.4 10*3/uL (ref 0.1–1.0)
Monocytes Relative: 10 %
NEUTROS ABS: 2.1 10*3/uL (ref 1.7–7.7)
NEUTROS PCT: 53 %

## 2015-04-15 LAB — COMPREHENSIVE METABOLIC PANEL
ALBUMIN: 4 g/dL (ref 3.5–5.0)
ALK PHOS: 62 U/L (ref 38–126)
ALT: 26 U/L (ref 17–63)
AST: 32 U/L (ref 15–41)
Anion gap: 9 (ref 5–15)
BUN: 10 mg/dL (ref 6–20)
CALCIUM: 9.3 mg/dL (ref 8.9–10.3)
CO2: 27 mmol/L (ref 22–32)
CREATININE: 1.14 mg/dL (ref 0.61–1.24)
Chloride: 104 mmol/L (ref 101–111)
GFR calc Af Amer: >60 mL/min (ref >60)
GFR calc non Af Amer: >60 mL/min (ref >60)
GLUCOSE: 96 mg/dL (ref 65–99)
Potassium: 3.9 mmol/L (ref 3.5–5.1)
SODIUM: 140 mmol/L (ref 135–145)
Total Bilirubin: 0.7 mg/dL (ref 0.3–1.2)
Total Protein: 6.9 g/dL (ref 6.5–8.1)

## 2015-04-15 LAB — I-STAT CHEM 8, ED
BUN: 12 mg/dL (ref 6–20)
CALCIUM ION: 1.18 mmol/L (ref 1.13–1.30)
CHLORIDE: 101 mmol/L (ref 101–111)
Creatinine, Ser: 1.1 mg/dL (ref 0.61–1.24)
GLUCOSE: 90 mg/dL (ref 65–99)
HCT: 43 % (ref 39.0–52.0)
Hemoglobin: 14.6 g/dL (ref 13.0–17.0)
Potassium: 4 mmol/L (ref 3.5–5.1)
Sodium: 141 mmol/L (ref 135–145)
TCO2: 27 mmol/L (ref 0–100)

## 2015-04-15 LAB — APTT: aPTT: 34 seconds (ref 24–37)

## 2015-04-15 LAB — I-STAT TROPONIN, ED: Troponin i, poc: 0 ng/mL (ref 0.00–0.08)

## 2015-04-15 MED ORDER — NAPROXEN 250 MG PO TABS
500.0000 mg | ORAL_TABLET | Freq: Once | ORAL | Status: AC
  Administered 2015-04-15: 500 mg via ORAL
  Filled 2015-04-15: qty 2

## 2015-04-15 MED ORDER — NAPROXEN 375 MG PO TABS: 375.0000 mg | ORAL_TABLET | Freq: Two times a day (BID) | ORAL | Status: DC
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
## 2015-04-15 NOTE — Discharge Instructions (Signed)
Please see the back speacialist for back pain - get a 2nd opinion if needed.  For the numbness in the hand and arm - please see the neurologist as requested.  Weakness - all the ER results are normal - we ordered a cardiac enzyme, ekg of the heart, CT scan of the head and basic labs - all normal. If not improving in a week, see your primary care doctor.   Back Pain, Adult Back pain is very common in adults.The cause of back pain is rarely dangerous and the pain often gets better over time.The cause of your back pain may not be known. Some common causes of back pain include:  Strain of the muscles or ligaments supporting the spine.  Wear and tear (degeneration) of the spinal disks.  Arthritis.  Direct injury to the back. For many people, back pain may return. Since back pain is rarely dangerous, most people can learn to manage this condition on their own. HOME CARE INSTRUCTIONS Watch your back pain for any changes. The following actions may help to lessen any discomfort you are feeling:  Remain active. It is stressful on your back to sit or stand in one place for long periods of time. Do not sit, drive, or stand in one place for more than 30 minutes at a time. Take short walks on even surfaces as soon as you are able.Try to increase the length of time you walk each day.  Exercise regularly as directed by your health care provider. Exercise helps your back heal faster. It also helps avoid future injury by keeping your muscles strong and flexible.  Do not stay in bed.Resting more than 1-2 days can delay your recovery.  Pay attention to your body when you bend and lift. The most comfortable positions are those that put less stress on your recovering back. Always use proper lifting techniques, including:  Bending your knees.  Keeping the load close to your body.  Avoiding twisting.  Find a comfortable position to sleep. Use a firm mattress and lie on your side with your knees  slightly bent. If you lie on your back, put a pillow under your knees.  Avoid feeling anxious or stressed.Stress increases muscle tension and can worsen back pain.It is important to recognize when you are anxious or stressed and learn ways to manage it, such as with exercise.  Take medicines only as directed by your health care provider. Over-the-counter medicines to reduce pain and inflammation are often the most helpful.Your health care provider may prescribe muscle relaxant drugs.These medicines help dull your pain so you can more quickly return to your normal activities and healthy exercise.  Apply ice to the injured area:  Put ice in a plastic bag.  Place a towel between your skin and the bag.  Leave the ice on for 20 minutes, 2-3 times a day for the first 2-3 days. After that, ice and heat may be alternated to reduce pain and spasms.  Maintain a healthy weight. Excess weight puts extra stress on your back and makes it difficult to maintain good posture. SEEK MEDICAL CARE IF:  You have pain that is not relieved with rest or medicine.  You have increasing pain going down into the legs or buttocks.  You have pain that does not improve in one week.  You have night pain.  You lose weight.  You have a fever or chills. SEEK IMMEDIATE MEDICAL CARE IF:   You develop new bowel or bladder control problems.  You have unusual weakness or numbness in your arms or legs.  You develop nausea or vomiting.  You develop abdominal pain.  You feel faint.   This information is not intended to replace advice given to you by your health care provider. Make sure you discuss any questions you have with your health care provider.   Document Released: 02/07/2005 Document Revised: 02/28/2014 Document Reviewed: 06/11/2013 Elsevier Interactive Patient Education 2016 Elsevier Inc[employer]  Paresthesia Paresthesia is an abnormal burning or prickling sensation. This sensation is generally felt in the  hands, arms, legs, or feet. However, it may occur in any part of the body. Usually, it is not painful. The feeling may be described as:  Tingling or numbness.  Pins and needles.  Skin crawling.  Buzzing.  Limbs falling asleep.  Itching. Most people experience temporary (transient) paresthesia at some time in their lives. Paresthesia may occur when you breathe too quickly (hyperventilation). It can also occur without any apparent cause. Commonly, paresthesia occurs when pressure is placed on a nerve. The sensation quickly goes away after the pressure is removed. For some people, however, paresthesia is a long-lasting (chronic) condition that is caused by an underlying disorder. If you continue to have paresthesia, you may need further medical evaluation. HOME CARE INSTRUCTIONS Watch your condition for any changes. Taking the following actions may help to lessen any discomfort that you are feeling:  Avoid drinking alcohol.  Try acupuncture or massage to help relieve your symptoms.  Keep all follow-up visits as directed by your health care provider. This is important. SEEK MEDICAL CARE IF:  You continue to have episodes of paresthesia.  Your burning or prickling feeling gets worse when you walk.  You have pain, cramps, or dizziness.  You develop a rash. SEEK IMMEDIATE MEDICAL CARE IF:  You feel weak.  You have trouble walking or moving.  You have problems with speech, understanding, or vision.  You feel confused.  You cannot control your bladder or bowel movements.  You have numbness after an injury.  You faint.   This information is not intended to replace advice given to you by your health care provider. Make sure you discuss any questions you have with your health care provider.   Document Released: 01/28/2002 Document Revised: 06/24/2014 Document Reviewed: 02/03/2014 Elsevier Interactive Patient Education [employer].

## 2015-04-15 NOTE — ED Notes (Signed)
Pt reports weakness and numbness in the right arm for about 2 days. States he had a fall yesterday. Pt needs a deaf interpreter. Family at the bedside

## 2015-04-15 NOTE — ED Notes (Signed)
Called Ct scan to inform them that they pt. Was ready to go.  Interrupter has arrived.

## 2015-04-15 NOTE — ED Notes (Signed)
Pt. Refused to go to CT scan, stated by Quillian Quince. Pt'  s family is upset due to being told in Triage that pt. Would go back to a Room in 5 minutes.  Spoke with Phoebe Perch RN informed her of pt.s concerns.  Also spoke with Andee Poles , to order an [employer] [place] Interruptor

## 2015-04-16 ENCOUNTER — Ambulatory Visit: Payer: Medicare Other | Admitting: Gastroenterology

## 2015-04-17 ENCOUNTER — Encounter (HOSPITAL_COMMUNITY): Payer: Self-pay | Admitting: Emergency Medicine

## 2015-04-17 ENCOUNTER — Emergency Department (HOSPITAL_COMMUNITY)
Admission: EM | Admit: 2015-04-17 | Discharge: 2015-04-17 | Disposition: A | Discharge status: Home / Self Care | Payer: Medicare Other | Attending: Emergency Medicine | Admitting: Emergency Medicine

## 2015-04-17 ENCOUNTER — Emergency Department (HOSPITAL_COMMUNITY): Payer: Medicare Other

## 2015-04-17 DIAGNOSIS — Z862 Personal history of diseases of the blood and blood-forming organs and certain disorders involving the immune mechanism: Secondary | ICD-10-CM | POA: Insufficient documentation

## 2015-04-17 DIAGNOSIS — Z791 Long term (current) use of non-steroidal anti-inflammatories (NSAID): Secondary | ICD-10-CM | POA: Diagnosis not present

## 2015-04-17 DIAGNOSIS — R0789 Other chest pain: Secondary | ICD-10-CM | POA: Diagnosis not present

## 2015-04-17 DIAGNOSIS — H9190 Unspecified hearing loss, unspecified ear: Secondary | ICD-10-CM | POA: Insufficient documentation

## 2015-04-17 DIAGNOSIS — Z7902 Long term (current) use of antithrombotics/antiplatelets: Secondary | ICD-10-CM | POA: Diagnosis not present

## 2015-04-17 DIAGNOSIS — Z79899 Other long term (current) drug therapy: Secondary | ICD-10-CM | POA: Insufficient documentation

## 2015-04-17 DIAGNOSIS — F17210 Nicotine dependence, cigarettes, uncomplicated: Secondary | ICD-10-CM | POA: Insufficient documentation

## 2015-04-17 DIAGNOSIS — Z7982 Long term (current) use of aspirin: Secondary | ICD-10-CM | POA: Diagnosis not present

## 2015-04-17 DIAGNOSIS — I10 Essential (primary) hypertension: Secondary | ICD-10-CM | POA: Insufficient documentation

## 2015-04-17 DIAGNOSIS — Z7951 Long term (current) use of inhaled steroids: Secondary | ICD-10-CM | POA: Insufficient documentation

## 2015-04-17 DIAGNOSIS — R079 Chest pain, unspecified: Secondary | ICD-10-CM | POA: Diagnosis not present

## 2015-04-17 DIAGNOSIS — E119 Type 2 diabetes mellitus without complications: Secondary | ICD-10-CM | POA: Insufficient documentation

## 2015-04-17 DIAGNOSIS — Z8673 Personal history of transient ischemic attack (TIA), and cerebral infarction without residual deficits: Secondary | ICD-10-CM | POA: Diagnosis not present

## 2015-04-17 DIAGNOSIS — Z7952 Long term (current) use of systemic steroids: Secondary | ICD-10-CM | POA: Insufficient documentation

## 2015-04-17 DIAGNOSIS — J45909 Unspecified asthma, uncomplicated: Secondary | ICD-10-CM | POA: Diagnosis not present

## 2015-04-17 DIAGNOSIS — R1013 Epigastric pain: Secondary | ICD-10-CM | POA: Insufficient documentation

## 2015-04-17 DIAGNOSIS — E782 Mixed hyperlipidemia: Secondary | ICD-10-CM | POA: Insufficient documentation

## 2015-04-17 DIAGNOSIS — K219 Gastro-esophageal reflux disease without esophagitis: Secondary | ICD-10-CM | POA: Insufficient documentation

## 2015-04-17 LAB — COMPREHENSIVE METABOLIC PANEL
ALBUMIN: 3.6 g/dL (ref 3.5–5.0)
ALK PHOS: 57 U/L (ref 38–126)
ALT: 22 U/L (ref 17–63)
AST: 29 U/L (ref 15–41)
Anion gap: 6 (ref 5–15)
BILIRUBIN TOTAL: 0.5 mg/dL (ref 0.3–1.2)
BUN: 11 mg/dL (ref 6–20)
CALCIUM: 8.2 mg/dL — AB (ref 8.9–10.3)
CO2: 28 mmol/L (ref 22–32)
CREATININE: 1.02 mg/dL (ref 0.61–1.24)
Chloride: 107 mmol/L (ref 101–111)
GFR calc Af Amer: >60 mL/min (ref >60)
GFR calc non Af Amer: >60 mL/min (ref >60)
GLUCOSE: 101 mg/dL — AB (ref 65–99)
Potassium: 3.9 mmol/L (ref 3.5–5.1)
Sodium: 141 mmol/L (ref 135–145)
TOTAL PROTEIN: 6.2 g/dL — AB (ref 6.5–8.1)

## 2015-04-17 LAB — TROPONIN I

## 2015-04-17 LAB — PROTIME-INR
INR: 1.08 (ref 0.00–1.49)
Prothrombin Time: 14.2 seconds (ref 11.6–15.2)

## 2015-04-17 LAB — CBC
HEMATOCRIT: 33.6 % — AB (ref 39.0–52.0)
HEMOGLOBIN: 11.6 g/dL — AB (ref 13.0–17.0)
MCH: 30.8 pg (ref 26.0–34.0)
MCHC: 34.5 g/dL (ref 30.0–36.0)
MCV: 89.1 fL (ref 78.0–100.0)
Platelets: 122 10*3/uL — ABNORMAL LOW (ref 150–400)
RBC: 3.77 MIL/uL — ABNORMAL LOW (ref 4.22–5.81)
RDW: 12.9 % (ref 11.5–15.5)
WBC: 3.2 10*3/uL — AB (ref 4.0–10.5)

## 2015-04-17 LAB — MAGNESIUM: Magnesium: 1.8 mg/dL (ref 1.7–2.4)

## 2015-04-17 MED ORDER — ASPIRIN 81 MG PO CHEW
324.0000 mg | CHEWABLE_TABLET | Freq: Once | ORAL | Status: DC
Start: 2015-04-17 — End: 2015-04-17

## 2015-04-17 MED ORDER — SUCRALFATE 1 G PO TABS: 1.0000 g | ORAL_TABLET | Freq: Three times a day (TID) | ORAL | Status: DC

## 2015-04-17 MED ORDER — GI COCKTAIL ~~LOC~~
30.0000 mL | Freq: Once | ORAL | Status: AC
  Administered 2015-04-17: 30 mL via ORAL
  Filled 2015-04-17: qty 30

## 2015-04-17 MED ORDER — FAMOTIDINE IN NACL 20-0.9 MG/50ML-% IV SOLN
20.0000 mg | Freq: Once | INTRAVENOUS | Status: AC
  Administered 2015-04-17: 20 mg via INTRAVENOUS
  Filled 2015-04-17: qty 50

## 2015-04-17 MED ORDER — SODIUM CHLORIDE 0.9 % IV BOLUS (SEPSIS)
500.0000 mL | Freq: Once | INTRAVENOUS | Status: AC
  Administered 2015-04-17: 500 mL via INTRAVENOUS

## 2015-04-17 MED ORDER — ESOMEPRAZOLE MAGNESIUM 40 MG PO CPDR: 40.0000 mg | DELAYED_RELEASE_CAPSULE | Freq: Two times a day (BID) | ORAL | Status: DC
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
## 2015-04-17 NOTE — Discharge Instructions (Signed)
As discussed, your evaluation today has been largely reassuring.  But, it is important that you monitor your condition carefully, and do not hesitate to return to the ED if you develop new, or concerning changes in your condition. ? ?Otherwise, please follow-up with your physician for appropriate ongoing care. ? ?

## 2015-04-17 NOTE — ED Notes (Signed)
Pt brought in via EMS. Called out for chest pain. Pt points to upper epigastric region. Pt does not speak, cannot hear. Communicates by pointing. EKG en route unremarkable. Pt has had 1 nitro, 324 mg ASA.

## 2015-04-23 ENCOUNTER — Other Ambulatory Visit: Payer: Self-pay

## 2015-04-23 ENCOUNTER — Ambulatory Visit (INDEPENDENT_AMBULATORY_CARE_PROVIDER_SITE_OTHER): Payer: Medicare Other | Admitting: Gastroenterology

## 2015-04-23 ENCOUNTER — Encounter: Payer: Self-pay | Admitting: Gastroenterology

## 2015-04-23 VITALS — BP 139/91 | HR 72 | Temp 98.3°F | Ht 64.0 in | Wt 171.6 lb

## 2015-04-23 DIAGNOSIS — R1013 Epigastric pain: Secondary | ICD-10-CM | POA: Diagnosis not present

## 2015-04-23 DIAGNOSIS — D869 Sarcoidosis, unspecified: Secondary | ICD-10-CM

## 2015-04-23 DIAGNOSIS — M79601 Pain in right arm: Secondary | ICD-10-CM | POA: Diagnosis not present

## 2015-04-23 DIAGNOSIS — R109 Unspecified abdominal pain: Secondary | ICD-10-CM
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
## 2015-04-23 NOTE — Progress Notes (Signed)
CC'ED TO PCP 

## 2015-04-23 NOTE — Assessment & Plan Note (Addendum)
SUDDEN ONSET AND REQUIRED ED VISIT. CLINICALLY IMPROVED. ETIOLOGY UNCLEAR IN PT WITH HISTORY OF GRANULOMATOUS GASTRITIS/SARCOIDOSIS, AND NOW WITH PANCYTOPENIA. PT ON CHRONIC ASA AND NEXIUM BID, DOUBT H PYLORI GASTRITIS OR PUD, OR GASTRIC OR PANCREATIC CA.  CT ABD W/ IV AND ORAL CONTRAST TO EVALUATE EPIGASTRIC PIN. WILL NEED PREMED DUE TO CONTRAST ALLERGY. EGD TO EVALUATE EPIGASTRIC PAIN. DISCUSSED PROCEDURE, BENEFITS, & RISKS: < 1% chance of medication reaction, PERFORATION, OR bleeding. REVIEWED LAST CT ABD/PELVIS WITH PT AND FAMILY. FOLLOW UP IN 6 MOS.

## 2015-04-23 NOTE — Assessment & Plan Note (Signed)
ETIOLOGY UNCLEAR, BUT KEEPS HIM AWAKE AT NIGHT. NOT SURE IF ETIOLOGY IS NEUROPATHIC OR MUSCULOSKELETAL. ABLE TO WALK USING R ARM FOR SUPPORT.  REFER TO ORTHO. FOLLOW UP WITH NEUROLOGY FOLLOW UP IN 6 MOS.

## 2015-04-23 NOTE — Assessment & Plan Note (Addendum)
MAY NEED MORE AGGRESSIVE SYSTEMIC TREATMENT IF BONE MARROW OR GI TRACT INVOLVEMENT IDENTIFIED. PULMONARY DISEASE APPEARS CONTROLLED. EPISODIC SEVERE EPIGASTRIC PAIN MAY BE DUE TO GRANULOMATOUS GASTRITIS.  HEMATOLOGY REFERRAL FOR PANCYTOPENIA. RHEUMATOLOGY REFERRAL TO CONSIDER MORE AGGRESSIVE TREATMENT CONTINUE PREDNISONE. FOLLOW UP IN 6 MOS.   GREATER THAN 50% WAS SPENT IN COUNSELING & COORDINATION OF CARE WITH THE PATIENT: DISCUSSED DIFFERENTIAL DIAGNOSIS, PROCEDURE, BENEFITS, RISKS, AND MANAGEMENT OF SARCOIDOSIS, EPIGASTRICPAIN, R ARM PAIN, AND PANCYTOPENIA. TOTAL ENCOUNTER TIME: 40 MINS.

## 2015-04-23 NOTE — Patient Instructions (Addendum)
COMPLETE UPPER ENDOSCOPY IN 2-3 WEEKS. HOLD JANUMET ON MORNING OF ENDOSCOPY. YOU MAY CONTINUE ASPIRIN AND PLAVIX.  COMPLETE CT SCAN OF THE CHEST ABD/PELVIS WITHIN THE NEXT 7 DAYS. HE HAS IV DYE ALLERGY-NEEDS PREDNISONE AND BENADRYL BEFORE CT SCAN        PREDNISONE 50 MG 13 HRS, 7 HRS, AND 1 HR PRIOR TO SCAN  BENADRYL 50 MG PO 1 HOUR PRIOR TO SCAN.  SEE ORTHOPEDICS & CALL CALL FOR A FOLLOW U WITH NEUROLOGY WITHIN THE NEXT 2-3 WEEKS FOR R ARM PAIN.   SEE HEMATOLOGY WITHIN THE NEXT MONTH.   SEE RHEUMATOLOGY IN THE NEXT 2 MOS.   FOLLOW UP IN 6 MOS.

## 2015-04-23 NOTE — Progress Notes (Signed)
ON RECALL  °

## 2015-04-29 ENCOUNTER — Ambulatory Visit (HOSPITAL_COMMUNITY): Payer: Medicare Other

## 2015-04-30 ENCOUNTER — Ambulatory Visit (HOSPITAL_COMMUNITY)
Admission: RE | Admit: 2015-04-30 | Discharge: 2015-04-30 | Disposition: A | Discharge status: Home / Self Care | Payer: Medicare Other | Source: Ambulatory Visit | Attending: Gastroenterology | Admitting: Gastroenterology

## 2015-04-30 DIAGNOSIS — R109 Unspecified abdominal pain: Secondary | ICD-10-CM | POA: Diagnosis not present

## 2015-04-30 DIAGNOSIS — N281 Cyst of kidney, acquired: Secondary | ICD-10-CM | POA: Diagnosis not present

## 2015-04-30 MED ORDER — IOHEXOL 300 MG/ML  SOLN
100.0000 mL | Freq: Once | INTRAMUSCULAR | Status: AC | PRN
  Administered 2015-04-30: 100 mL via INTRAVENOUS

## 2015-05-04 ENCOUNTER — Encounter: Payer: Self-pay | Admitting: Internal Medicine

## 2015-05-04 ENCOUNTER — Ambulatory Visit (INDEPENDENT_AMBULATORY_CARE_PROVIDER_SITE_OTHER): Payer: Medicare Other | Admitting: Internal Medicine

## 2015-05-04 VITALS — BP 146/88 | HR 72 | Ht 64.0 in | Wt 167.0 lb

## 2015-05-04 DIAGNOSIS — J449 Chronic obstructive pulmonary disease, unspecified: Secondary | ICD-10-CM

## 2015-05-04 DIAGNOSIS — J00 Acute nasopharyngitis [common cold]: Secondary | ICD-10-CM | POA: Diagnosis not present

## 2015-05-04 DIAGNOSIS — D869 Sarcoidosis, unspecified: Secondary | ICD-10-CM

## 2015-05-04 MED ORDER — AMOXICILLIN-POT CLAVULANATE 875-125 MG PO TABS: 1.0000 | ORAL_TABLET | Freq: Two times a day (BID) | ORAL | Status: DC

## 2015-05-04 MED ORDER — MOMETASONE FURO-FORMOTEROL FUM 100-5 MCG/ACT IN AERO: INHALATION_SPRAY | RESPIRATORY_TRACT | Status: DC

## 2015-05-04 NOTE — Progress Notes (Signed)
Subjective:   Patient ID: Charles [name], male    DOB: November 06, 1955    MRN: [id number]  Brief patient profile:  60 yowm dx  Active smoker first aware of sob age 60 referred to pulmonary clinic 10/11/12 with dx of sarcoid and atypical sob ? acei effect extremely difficult to maintain med reconciliation as he is both deaf and very stubborn re taking care of his own meds    History of Present Illness   [name] -Interpretor  Hx of CVA w/ hemiplegia   10/11/2012  1st [facility] pulmonary eval cc short of breath ? When did you first notice it ?  Mother "well the doctor's discovered" it dx with sarcoid in [place] and placed on prednisone since decades and maintained on prednisone and prinizil.  He was able to play sports in high school but mother says ?always needed advair (note advair wasn't made until he was 75) "well he was always on some pump" (interesting comment as he showed no capacity at all to use hfa).  Thru the interpreter after mutliple back and forths and asking the mother to let him answer the question, he has been variably sob since age 22, sometimes can't get across the room s giving out but never at rest, never sleeping, assoc with dry cough ? Since when  > on ACEi  Dx and rx as sarcoid by [name] doc on pred 10 mg per day but not clear whether ever changed his breathing for the better and so maint on pred 10 mg per day  >>D/C acei and advair    05/13/2014 f/u ov/[name] re: sarcoid/ chronically steroid dep  Chief Complaint  Patient presents with  . Follow-up    Pt c/o incr SOB and dryness in chest when breathing. Pt does not feel that Prednisone is working. Pt reports changing diet some since last OV.   maintained on 10 mg daily  No worse cough  "brought all meds"  =  No proair  Pt has assumed all med management and taking ppi incorrectly and pred 10 where it was one half Pt nor fm using the med calendar format rec Please remember to go to the x-ray department downstairs  for your tests - we will call you with the results when they are available. Only use your albuterol as a rescue medication to  [place] to double prednisone if breathing worse between now and the next visit Nexium 40 mg Take 30-60 min should be before first meal of the day     06/13/2014 f/u ov/[name] re: copd GOLD II with reversiblity / did not bring spiriva with him / did bring proair but hfa quite poor/ pred at 10 mg daily   Chief Complaint  Patient presents with  . Follow-up    PFT done today. Breathing has improved. No new co's today.     Still not taking ppi ac  >[id number]  rx    11/04/2014 f/u ov/[name] re: COPD Gold II with reversibility/ pred at 10 mg daily/ dulera on 0 didn't know it. Chief Complaint  Patient presents with  . Follow-up    Pt states that his breathing is unchanged since the last visit. He is using rescue inhaler   limited more by back than breathing' very poor hfa/ dulera completely empty, pt did not know when the counter went to 0 rec No change in medications Work on inhaler technique:    You will need to let your family listen to the way you  inhale to be sure your are doing it effectively  Please schedule a follow up visit in 3 months but call sooner if needed with all inhalers in hand     02/03/2015  f/u ov/[name] re: GOLD II/ sarcoid rx =  prednisone 10 / dulera but verypoor hfa - did not bring all inhalers as rec/ pred at 10 mg daily  Chief Complaint  Patient presents with  . Follow-up    Pt states that he has been smoking about once a month and recently did have some SOB afterward that did improve with albuterol HFA use. Pt denies any current cough/wheeze/SOB/CP/tightness. Pt states he does use his inhalers as directed.    very confusing again as to exactly what he takes. I don't believe he's been using Spiriva at all. On the other hand he actually looks pretty good and is really not limited by his breathing at this point. rec Ok to leave off spiriva if you  don't think it's helping  Work on inhaler technique:     05/04/2015  f/u ov/[name] re: sarcoid on prednisone 10 mg daily and dulera 200 bid with hfa close to 0% effective  Chief Complaint  Patient presents with  . Follow-up    pt doing well, does note some L sided sinus congestion with yellow mucus on occasion.     Not limited by breathing from desired activities     No obvious day to day or daytime variability or assoc chronic cough or cp or chest tightness, subjective wheeze or overt   hb symptoms. No unusual exp hx or h/o childhood pna/ asthma or knowledge of premature birth.  Sleeping ok without nocturnal  or early am exacerbation  of respiratory  c/o's or need for noct saba. Also denies any obvious fluctuation of symptoms with weather or environmental changes or other aggravating or alleviating factors except as outlined above   Current Medications, Allergies, Complete Past Medical History, Past Surgical History, Family History, and Social History were reviewed in [employer] record.  ROS  The following are not active complaints unless bolded sore throat, dysphagia, dental problems, itching, sneezing,  nasal congestion or excess/ purulent secretions, ear ache,   fever, chills, sweats, unintended wt loss, classically pleuritic or exertional cp, hemoptysis,  orthopnea pnd or leg swelling, presyncope, palpitations, abdominal pain, anorexia, nausea, vomiting, diarrhea  or change in bowel or bladder habits, change in stools or urine, dysuria,hematuria,  rash, arthralgias, visual complaints, headache, numbness, weakness or ataxia or problems with walking or coordination walks with cane very slowly at home,  change in mood/affect or memory.                  Objective:   Physical Exam  amb bm  Deaf  [place] walks with 4 pronged crutch  08/08/2013  170 > 172 7/16 >  10/17/2013 168 >171 01/13/2014 > 05/13/2014  167>06/13/2014   172 >176 07/01/2014 > 11/04/2014 177 > .02/04/2015  179 > 05/04/2015    167   Interpretor in room /Deaf   HEENT: nl dentition, turbinates, and orophanx. Nl external ear canals without cough reflex   NECK :  without JVD/Nodes/TM/ nl carotid upstrokes bilaterally   LUNGS: no acc muscle use, clear to A and P bilaterally without cough on insp or exp maneuvers   CV:  RRR  no s3 or murmur or increase in P2, no edema   ABD:  soft and nontender with nl excursion in the supine position. No bruits  or organomegaly, bowel sounds nl  MS:  warm without deformities, calf tenderness, cyanosis or clubbing  SKIN: warm and dry without lesions         I personally reviewed images and agree with radiology impression as follows:  CXR:  03/30/15 Evidence of prior granulomatous disease. Areas of scarring bilaterally, stable. No edema or consolidation.      Assessment & Plan:

## 2015-05-04 NOTE — Patient Instructions (Addendum)
Work on inhaler technique:  relax and gently blow all the way out then take a nice smooth deep breath back in, triggering the inhaler at same time you start breathing in.  Hold for up to 5 seconds if you can. Blow out thru nose. Rinse and gargle with water when done  I called in a new strength of dulera = 100 Take 2 puffs first thing in am and then another 2 puffs about 12 hours later.   Augmentin 875 mg take one pill twice daily  X 10 days - take at breakfast and supper with large glass of water.  It would help reduce the usual side effects (diarrhea and yeast infections) if you ate cultured yogurt at lunch.   Nasal saline is excellent to help with nasal congestion   Please schedule a follow up visit in 3 months but call sooner if needed

## 2015-05-05 ENCOUNTER — Ambulatory Visit (HOSPITAL_COMMUNITY)
Admission: RE | Admit: 2015-05-05 | Discharge: 2015-05-05 | Disposition: A | Discharge status: Home / Self Care | Payer: Medicare Other | Source: Ambulatory Visit | Attending: Gastroenterology | Admitting: Gastroenterology

## 2015-05-05 ENCOUNTER — Encounter (HOSPITAL_COMMUNITY)
Admission: RE | Disposition: A | Discharge status: Home / Self Care | Payer: Self-pay | Source: Ambulatory Visit | Attending: Gastroenterology

## 2015-05-05 ENCOUNTER — Telehealth: Payer: Self-pay | Admitting: Gastroenterology

## 2015-05-05 ENCOUNTER — Encounter (HOSPITAL_COMMUNITY): Payer: Self-pay | Admitting: *Deleted

## 2015-05-05 ENCOUNTER — Encounter: Payer: Self-pay | Admitting: Internal Medicine

## 2015-05-05 DIAGNOSIS — Z7984 Long term (current) use of oral hypoglycemic drugs: Secondary | ICD-10-CM | POA: Diagnosis not present

## 2015-05-05 DIAGNOSIS — D869 Sarcoidosis, unspecified: Secondary | ICD-10-CM | POA: Diagnosis not present

## 2015-05-05 DIAGNOSIS — K2940 Chronic atrophic gastritis without bleeding: Secondary | ICD-10-CM | POA: Diagnosis not present

## 2015-05-05 DIAGNOSIS — R1013 Epigastric pain: Secondary | ICD-10-CM | POA: Diagnosis not present

## 2015-05-05 DIAGNOSIS — K2970 Gastritis, unspecified, without bleeding: Secondary | ICD-10-CM | POA: Diagnosis not present

## 2015-05-05 DIAGNOSIS — Z79899 Other long term (current) drug therapy: Secondary | ICD-10-CM | POA: Diagnosis not present

## 2015-05-05 DIAGNOSIS — K219 Gastro-esophageal reflux disease without esophagitis: Secondary | ICD-10-CM | POA: Diagnosis not present

## 2015-05-05 DIAGNOSIS — Z7982 Long term (current) use of aspirin: Secondary | ICD-10-CM | POA: Insufficient documentation

## 2015-05-05 DIAGNOSIS — Z8673 Personal history of transient ischemic attack (TIA), and cerebral infarction without residual deficits: Secondary | ICD-10-CM | POA: Insufficient documentation

## 2015-05-05 DIAGNOSIS — I10 Essential (primary) hypertension: Secondary | ICD-10-CM | POA: Diagnosis not present

## 2015-05-05 DIAGNOSIS — Z7902 Long term (current) use of antithrombotics/antiplatelets: Secondary | ICD-10-CM | POA: Diagnosis not present

## 2015-05-05 DIAGNOSIS — K2950 Unspecified chronic gastritis without bleeding: Secondary | ICD-10-CM | POA: Diagnosis not present

## 2015-05-05 DIAGNOSIS — J00 Acute nasopharyngitis [common cold]: Secondary | ICD-10-CM | POA: Insufficient documentation

## 2015-05-05 DIAGNOSIS — E119 Type 2 diabetes mellitus without complications: Secondary | ICD-10-CM | POA: Insufficient documentation

## 2015-05-05 HISTORY — PX: ESOPHAGOGASTRODUODENOSCOPY: SHX5428

## 2015-05-05 LAB — GLUCOSE, CAPILLARY: GLUCOSE-CAPILLARY: 107 mg/dL — AB (ref 65–99)

## 2015-05-05 SURGERY — EGD (ESOPHAGOGASTRODUODENOSCOPY)
Anesthesia: Moderate Sedation

## 2015-05-05 MED ORDER — MEPERIDINE HCL 100 MG/ML IJ SOLN
INTRAMUSCULAR | Status: DC | PRN
  Administered 2015-05-05: 25 mg

## 2015-05-05 MED ORDER — LIDOCAINE VISCOUS 2 % MT SOLN
OROMUCOSAL | Status: DC | PRN
  Administered 2015-05-05: 1 via OROMUCOSAL

## 2015-05-05 MED ORDER — MEPERIDINE HCL 100 MG/ML IJ SOLN
INTRAMUSCULAR | Status: AC
  Filled 2015-05-05: qty 2

## 2015-05-05 MED ORDER — SODIUM CHLORIDE 0.9 % IV SOLN
INTRAVENOUS | Status: DC
  Administered 2015-05-05: 08:00:00 via INTRAVENOUS

## 2015-05-05 MED ORDER — MIDAZOLAM HCL 5 MG/5ML IJ SOLN
INTRAMUSCULAR | Status: AC
  Filled 2015-05-05: qty 10

## 2015-05-05 MED ORDER — MIDAZOLAM HCL 5 MG/5ML IJ SOLN
INTRAMUSCULAR | Status: DC | PRN
  Administered 2015-05-05: 2 mg via INTRAVENOUS

## 2015-05-05 MED ORDER — LIDOCAINE VISCOUS 2 % MT SOLN
OROMUCOSAL | Status: AC
  Filled 2015-05-05: qty 15

## 2015-05-05 NOTE — Assessment & Plan Note (Addendum)
Since thick yellow nasal discharge rec rx augmentin x 10 days  Plus NS and f/u sinus  Ct prn

## 2015-05-05 NOTE — Op Note (Signed)
[facility] Patient Name: Charles [name] Procedure Date: 05/05/2015 7:39 AM MRN: [id number] Date of Birth: 05-18-1955 Attending MD: Barney Drain , MD CSN: [id number] Age: 60 Admit Type: Outpatient Procedure:                Upper GI endoscopy Indications:              Dyspepsia Providers:                Barney Drain, MD, Janeece Riggers, RN, Randa Spike,                            Technician Referring MD:             Fuller Canada Manuella Ghazi, MD (Referring MD) Medicines:                Meperidine 25 mg IV, Midazolam 2 mg IV MD STARTED                            SEDATION: [id number]. PROCEDURE COMPLETE: [id number] Complications:            No immediate complications. Estimated Blood Loss:     Estimated blood loss was minimal. Procedure:                Pre-Anesthesia Assessment:                           - Prior to the procedure, a History and Physical                            was performed, and patient medications and                            allergies were reviewed. The patient's tolerance of                            previous anesthesia was also reviewed. The risks                            and benefits of the procedure and the sedation                            options and risks were discussed with the patient.                            All questions were answered, and informed consent                            was obtained. Anticoagulants: The patient has taken                            aspirin. It was decided not to withhold this                            medication prior to the procedure. ASA Grade  Assessment: II - A patient with mild systemic                            disease. After reviewing the risks and benefits,                            the patient was deemed in satisfactory condition to                            undergo the procedure.                           After obtaining informed consent, the endoscope was                            passed under  direct vision. Throughout the                            procedure, the patient's blood pressure, pulse, and                            oxygen saturations were monitored continuously. The                            [id number]) scope was introduced through the                            mouth, and advanced to the second part of duodenum.                            The upper GI endoscopy was accomplished without                            difficulty. The patient tolerated the procedure                            well. Scope In: 7:54:37 AM Scope Out: 8:03:51 AM Total Procedure Duration: 0 hours 9 minutes 14 seconds  Findings:      The esophagus was normal.      Scattered mild inflammation characterized by congestion (edema) and       erythema was found in the gastric antrum. Biopsies were taken with a       cold forceps for Helicobacter pylori testing. Estimated blood loss was       minimal. Impression:               - Normal esophagus.                           - Chronic gastritis. Biopsied.                           - Gastritis. Moderate Sedation:      Moderate (conscious) sedation was administered by the endoscopy nurse       and supervised by the endoscopist. The following parameters were  monitored: oxygen saturation, heart rate, blood pressure, respiratory       rate, EKG, adequacy of pulmonary ventilation, and response to care.       Total physician intraservice time was 19 minutes. Recommendation:           - Patient has a contact number available for                            emergencies. The signs and symptoms of potential                            delayed complications were discussed with the                            patient. Return to normal activities tomorrow.                            Written discharge instructions were provided to the                            patient. Medication reconciliation was performed,                            and a list of the  patient's discharge medications                            was provided to the patient.                           - Await pathology results.                           DRINK WATER TO KEEP YOUR URINE LIGHT YELLOW.                           FOLLOW A LOW FAT DIET. AVOID FRIED FOODS. MEATS                            SHOULD BE BAKED, BROILED, OR BOILED.                           CONTINUE NEXIUM. TAKE 30 MINUTES BEFORE MEALS TWICE                            DAILY.                           FOLLOW UP IN 4 MOS. Procedure Code(s):        --- Professional ---                           [phone], Esophagogastroduodenoscopy, flexible,                            transoral; with biopsy, single or multiple  G0500, Moderate sedation services provided by the                            same physician or other qualified health care                            professional performing a gastrointestinal                            endoscopic service that sedation supports,                            requiring the presence of an independent trained                            observer to assist in the monitoring of the                            patient's level of consciousness and physiological                            status; initial 15 minutes of intra-service time;                            patient age 109 years or older (additional time may                            be reported with [phone], as appropriate) Diagnosis Code(s):        --- Professional ---                           K29.50, Unspecified chronic gastritis without                            bleeding                           K29.70, Gastritis, unspecified, without bleeding                           R10.13, Epigastric pain CPT copyright 2016 American Medical Association. All rights reserved. The codes documented in this report are preliminary and upon coder review may  be revised to meet current compliance requirements. Barney Drain, MD Barney Drain, MD 05/05/2015 2:56:08 PM This report has been signed electronically. Number of Addenda: 0

## 2015-05-05 NOTE — Telephone Encounter (Signed)
Pt's mom is aware.  

## 2015-05-05 NOTE — OR Nursing (Signed)
Used Dee with [employer] for interpretation via video remote access from the emergency department.

## 2015-05-05 NOTE — Discharge Instructions (Signed)
You have gastritis & DUODENITIS DUE TO ASPIRIN, PREDNISONE, AND NAPROXEN. I biopsied your stomach.   DRINK WATER TO KEEP YOUR URINE LIGHT YELLOW.  FOLLOW A LOW FAT DIET.  AVOID FRIED FOODS. MEATS SHOULD BE BAKED, BROILED, OR BOILED. SEE INFO BELOW.  CONTINUE NEXIUM. TAKE 30 MINUTES BEFORE MEALS TWICE DAILY.  YOUR BIOPSY RESULTS WILL BE AVAILABLE IN MY CHART MAR 17 AND MY OFFICE WILL CONTACT YOU IN 10-14 DAYS WITH YOUR RESULTS.   FOLLOW UP IN 4 MOS.    UPPER ENDOSCOPY AFTER CARE Read the instructions outlined below and refer to this sheet in the next week. These discharge instructions provide you with general information on caring for yourself after you leave the hospital. While your treatment has been planned according to the most current medical practices available, unavoidable complications occasionally occur. If you have any problems or questions after discharge, call DR. Nagee Goates, [phone].  ACTIVITY  You may resume your regular activity, but move at a slower pace for the next 24 hours.   Take frequent rest periods for the next 24 hours.   Walking will help get rid of the air and reduce the bloated feeling in your belly (abdomen).   No driving for 24 hours (because of the medicine (anesthesia) used during the test).   You may shower.   Do not sign any important legal documents or operate any machinery for 24 hours (because of the anesthesia used during the test).    NUTRITION  Drink plenty of fluids.   You may resume your normal diet as instructed by your doctor.   Begin with a light meal and progress to your normal diet. Heavy or fried foods are harder to digest and may make you feel sick to your stomach (nauseated).   Avoid alcoholic beverages for 24 hours or as instructed.    MEDICATIONS  You may resume your normal medications.   WHAT YOU CAN EXPECT TODAY  Some feelings of bloating in the abdomen.   Passage of more gas than usual.    IF YOU HAD A  BIOPSY TAKEN DURING THE UPPER ENDOSCOPY:  Eat a soft diet IF YOU HAVE NAUSEA, BLOATING, ABDOMINAL PAIN, OR VOMITING.    FINDING OUT THE RESULTS OF YOUR TEST Not all test results are available during your visit. DR. Oneida Alar WILL CALL YOU WITHIN 14 DAYS OF YOUR PROCEDUE WITH YOUR RESULTS. Do not assume everything is normal if you have not heard from DR. Cambryn Charters IN 14 DAYS, CALL HER OFFICE AT [phone].  SEEK IMMEDIATE MEDICAL ATTENTION AND CALL THE OFFICE: [phone] IF:  You have more than a spotting of blood in your stool.   Your belly is swollen (abdominal distention).   You are nauseated or vomiting.   You have a temperature over 101F.   You have abdominal pain or discomfort that is severe or gets worse throughout the day.   Gastritis/DUODENITIS  Gastritis is an inflammation (the body's way of reacting to injury and/or infection) of the stomach. DUODENITIS is an inflammation (the body's way of reacting to injury and/or infection) of the FIRST PART OF THE SMALL INTESTINES. It is often caused by bacterial (germ) infections. It can also be caused BY ASPIRIN, BC/GOODY POWDER'S, (IBUPROFEN) MOTRIN, OR ALEVE (NAPROXEN), chemicals (including alcohol), SPICY FOODS, and medications. This illness may be associated with generalized malaise (feeling tired, not well), UPPER ABDOMINAL STOMACH cramps, and fever. One common bacterial cause of gastritis is an organism known as H. Pylori. This can be treated with  antibiotics.     Low-Fat Diet BREADS, CEREALS, PASTA, RICE, DRIED PEAS, AND BEANS These products are high in carbohydrates and most are low in fat. Therefore, they can be increased in the diet as substitutes for fatty foods. They too, however, contain calories and should not be eaten in excess. Cereals can be eaten for snacks as well as for breakfast.  Include foods that contain fiber (fruits, vegetables, whole grains, and legumes). Research shows that fiber may lower blood cholesterol  levels, especially the water-soluble fiber found in fruits, vegetables, oat products, and legumes. FRUITS AND VEGETABLES It is good to eat fruits and vegetables. Besides being sources of fiber, both are rich in vitamins and some minerals. They help you get the daily allowances of these nutrients. Fruits and vegetables can be used for snacks and desserts. MEATS Limit lean meat, chicken, [place], and fish to no more than 6 ounces per day. Beef, Pork, and Lamb Use lean cuts of beef, pork, and lamb. Lean cuts include:  Extra-lean ground beef.  Arm roast.  Sirloin tip.  Center-cut ham.  Round steak.  Loin chops.  Rump roast.  Tenderloin.  Trim all fat off the outside of meats before cooking. It is not necessary to severely decrease the intake of red meat, but lean choices should be made. Lean meat is rich in protein and contains a highly absorbable form of iron. Premenopausal women, in particular, should avoid reducing lean red meat because this could increase the risk for low red blood cells (iron-deficiency anemia).  Chicken and [place] These are good sources of protein. The fat of poultry can be reduced by removing the skin and underlying fat layers before cooking. Chicken and [place] can be substituted for lean red meat in the diet. Poultry should not be fried or covered with high-fat sauces. Fish and Shellfish Fish is a good source of protein. Shellfish contain cholesterol, but they usually are low in saturated fatty acids. The preparation of fish is important. Like chicken and [place], they should not be fried or covered with high-fat sauces. EGGS Egg whites contain no fat or cholesterol. They can be eaten often. Try 1 to 2 egg whites instead of whole eggs in recipes or use egg substitutes that do not contain yolk.  MILK AND DAIRY PRODUCTS Use skim or 1% milk instead of 2% or whole milk. Decrease whole milk, natural, and processed cheeses. Use nonfat or low-fat (2%) cottage cheese or low-fat  cheeses made from vegetable oils. Choose nonfat or low-fat (1 to 2%) yogurt. Experiment with evaporated skim milk in recipes that call for heavy cream. Substitute low-fat yogurt or low-fat cottage cheese for sour cream in dips and salad dressings. Have at least 2 servings of low-fat dairy products, such as 2 glasses of skim (or 1%) milk each day to help get your daily calcium intake.  FATS AND OILS Butterfat, lard, and beef fats are high in saturated fat and cholesterol. These should be avoided.Vegetable fats do not contain cholesterol. AVOID coconut oil, palm oil, and palm kernel oil, WHICH are very high in saturated fats. These should be limited. These fats are often used in bakery goods, processed foods, popcorn, oils, and nondairy creamers. Vegetable shortenings and some peanut butters contain hydrogenated oils, which are also saturated fats. Read the labels on these foods and check for saturated vegetable oils.  Desirable liquid vegetable oils are corn oil, cottonseed oil, olive oil, canola oil, safflower oil, soybean oil, and sunflower oil. Peanut oil is not as  good, but small amounts are acceptable. Buy a heart-healthy tub margarine that has no partially hydrogenated oils in the ingredients. AVOID Mayonnaise and salad dressings often are made from unsaturated fats.  OTHER EATING TIPS Snacks  Most sweets should be limited as snacks. They tend to be rich in calories and fats, and their caloric content outweighs their nutritional value. Some good choices in snacks are graham crackers, melba toast, soda crackers, bagels (no egg), English muffins, fruits, and vegetables. These snacks are preferable to snack crackers, [place] fries, and chips. Popcorn should be air-popped or cooked in small amounts of liquid vegetable oil.  Desserts Eat fruit, low-fat yogurt, and fruit ices instead of pastries, cake, and cookies. Sherbet, angel food cake, gelatin dessert, frozen low-fat yogurt, or other frozen products  that do not contain saturated fat (pure fruit juice bars, frozen ice pops) are also acceptable.   COOKING METHODS Choose those methods that use little or no fat. They include: Poaching.  Braising.  Steaming.  Grilling.  Baking.  Stir-frying.  Broiling.  Microwaving.  Foods can be cooked in a nonstick pan without added fat, or use a nonfat cooking spray in regular cookware. Limit fried foods and avoid frying in saturated fat. Add moisture to lean meats by using water, broth, cooking wines, and other nonfat or low-fat sauces along with the cooking methods mentioned above. Soups and stews should be chilled after cooking. The fat that forms on top after a few hours in the refrigerator should be skimmed off. When preparing meals, avoid using excess salt. Salt can contribute to raising blood pressure in some people.  EATING AWAY FROM HOME Order entres, potatoes, and vegetables without sauces or butter. When meat exceeds the size of a deck of cards (3 to 4 ounces), the rest can be taken home for another meal. Choose vegetable or fruit salads and ask for low-calorie salad dressings to be served on the side. Use dressings sparingly. Limit high-fat toppings, such as bacon, crumbled eggs, cheese, sunflower seeds, and olives. Ask for heart-healthy tub margarine instead of butter.

## 2015-05-05 NOTE — Assessment & Plan Note (Addendum)
-   ex intol since age 60 - trial off acei 10/12/2012  - 11/19/2012   Walked RA x one lap @ 185 stopped due to  Legs gave out no desat  - trial of breo samples 11/19/2012 >> improved on breo/spiriva 04/01/2013 > try off spiriva 05/14/2013 due to dry mouth> no worse 08/09/2013  - Spirometry 11/19/2012  FEV1  1.29 (49) ratio 55  - Spirometry 10/17/2013   FEV1  1.17 (44%) ratio 55 off breo x one week and prednisone at 5 mg daily > d/c BREO as pt not convinced help - 10/17/2013   Walked RA x one lap @ 185 stopped due to fatigue not sob  - PFTs  06/13/2014  FEV1 1.47(60) ratio 53 with 30% improvement   dlco 65 improves to 109 p alv vol while on pred 10 mg daily  - 06/13/2014 p extensive coaching HFA effectiveness =    50% so try dulera 200 2 twice daily  - 11/04/2014  hfa < 25 % but pt doing well on dulera 200 so rec continue for now  - 05/04/2015  extensive coaching HFA effectiveness =    0% so no need for the high dose of dulera > try dulera 100 2bid   Really unclear how much AB present and what is present should be more than addressed by prednisone at 10 mg daily but would not rely here on ability for him to master hfa and if breathing worse low threshold to change to ics/laba neb since failed BREO

## 2015-05-05 NOTE — Interval H&P Note (Signed)
History and Physical Interval Note:  05/05/2015 7:28 AM  Charles [name]  has presented today for surgery, with the diagnosis of DYSPEPSIA  The various methods of treatment have been discussed with the patient and family. After consideration of risks, benefits and other options for treatment, the patient has consented to  Procedure(s) with comments: ESOPHAGOGASTRODUODENOSCOPY (EGD) (N/A) - 730 as a surgical intervention .  The patient's history has been reviewed, patient examined, no change in status, stable for surgery.  I have reviewed the patient's chart and labs.  Questions were answered to the patient's satisfaction.     [employer]

## 2015-05-05 NOTE — Telephone Encounter (Signed)
PLEASE CALL PT'S MOTHER. HIS CT SHOWS NO ACUTE ABDOMINAL PROCESS. HIS EPIGASTRIC PAIN IS MOST LIKELY DUE TO ASA, PREDNISONE, AND NAPROXEN. CONTINUE NEXIUM BID.

## 2015-05-05 NOTE — Assessment & Plan Note (Signed)
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 Could he have lymphoma?   For now do not rec any change in rx and would caution all involved that medication reconciliation is an extreme challenge here so the simpler we keep things the better
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
## 2015-05-07 ENCOUNTER — Encounter (HOSPITAL_COMMUNITY): Payer: Self-pay | Admitting: Gastroenterology

## 2015-05-15 ENCOUNTER — Encounter: Payer: Self-pay | Admitting: Podiatry

## 2015-05-15 ENCOUNTER — Ambulatory Visit (INDEPENDENT_AMBULATORY_CARE_PROVIDER_SITE_OTHER): Payer: Medicare Other | Admitting: Podiatry

## 2015-05-15 DIAGNOSIS — M79609 Pain in unspecified limb: Principal | ICD-10-CM

## 2015-05-15 DIAGNOSIS — B351 Tinea unguium: Secondary | ICD-10-CM | POA: Diagnosis not present

## 2015-05-15 NOTE — Progress Notes (Signed)
Patient ID: MERDITH LUCIBELLO, male   DOB: May 08, 1955, 60 y.o.   MRN: [id number] Complaint:  Visit Type: Patient returns to my office for continued preventative foot care services. Complaint: Patient states" my nails have grown long and thick and become painful to walk and wear shoes" Patient has been diagnosed with DM with no foot complications. The patient presents for preventative foot care services. No changes to ROS  Podiatric Exam: Vascular: dorsalis pedis and posterior tibial pulses are palpable bilateral. Capillary return is immediate. Temperature gradient is WNL. Skin turgor WNL  Sensorium: Normal Semmes Weinstein monofilament test. Normal tactile sensation bilaterally. Nail Exam: Pt has thick disfigured discolored nails with subungual debris noted bilateral entire nail hallux  Ulcer Exam: There is no evidence of ulcer or pre-ulcerative changes or infection. Orthopedic Exam: Muscle tone and strength are WNL. No limitations in general ROM. No crepitus or effusions noted. Foot type and digits show no abnormalities. Bony prominences are unremarkable. Skin: No Porokeratosis. No infection or ulcers  Diagnosis:  Onychomycosis, , Pain in right toe, pain in left toes  Treatment & Plan Procedures and Treatment: Consent by patient was obtained for treatment procedures. The patient understood the discussion of treatment and procedures well. All questions were answered thoroughly reviewed. Debridement of mycotic and hypertrophic toenails, 1 through 5 bilateral and clearing of subungual debris. No ulceration, no infection noted.  Return Visit-Office Procedure: Patient instructed to return to the office for a follow up visit 3 months for continued evaluation and treatment.   Gardiner Barefoot DPM

## 2015-05-19 ENCOUNTER — Encounter (HOSPITAL_COMMUNITY): Payer: Self-pay

## 2015-05-20 ENCOUNTER — Other Ambulatory Visit: Payer: Self-pay | Admitting: Nurse Practitioner

## 2015-05-22 ENCOUNTER — Encounter (HOSPITAL_COMMUNITY): Payer: Self-pay | Admitting: Hematology & Oncology

## 2015-05-22 ENCOUNTER — Encounter (HOSPITAL_COMMUNITY): Payer: Medicare Other

## 2015-05-22 ENCOUNTER — Encounter (HOSPITAL_COMMUNITY): Payer: Medicare Other | Attending: Hematology & Oncology | Admitting: Hematology & Oncology

## 2015-05-22 ENCOUNTER — Ambulatory Visit (HOSPITAL_COMMUNITY): Payer: Medicare Other | Admitting: Hematology & Oncology

## 2015-05-22 VITALS — BP 146/82 | HR 64 | Temp 98.5°F | Resp 18 | Wt 166.2 lb

## 2015-05-22 DIAGNOSIS — D61818 Other pancytopenia: Secondary | ICD-10-CM | POA: Diagnosis not present

## 2015-05-22 DIAGNOSIS — D869 Sarcoidosis, unspecified: Secondary | ICD-10-CM

## 2015-05-22 LAB — COMPREHENSIVE METABOLIC PANEL
ALBUMIN: 4.4 g/dL (ref 3.5–5.0)
ALT: 28 U/L (ref 17–63)
AST: 27 U/L (ref 15–41)
Alkaline Phosphatase: 74 U/L (ref 38–126)
Anion gap: 8 (ref 5–15)
BUN: 14 mg/dL (ref 6–20)
CHLORIDE: 102 mmol/L (ref 101–111)
CO2: 26 mmol/L (ref 22–32)
CREATININE: 0.95 mg/dL (ref 0.61–1.24)
Calcium: 8.6 mg/dL — ABNORMAL LOW (ref 8.9–10.3)
GFR calc Af Amer: >60 mL/min (ref >60)
GFR calc non Af Amer: >60 mL/min (ref >60)
Glucose, Bld: 138 mg/dL — ABNORMAL HIGH (ref 65–99)
POTASSIUM: 4 mmol/L (ref 3.5–5.1)
SODIUM: 136 mmol/L (ref 135–145)
Total Bilirubin: 0.4 mg/dL (ref 0.3–1.2)
Total Protein: 7.2 g/dL (ref 6.5–8.1)

## 2015-05-22 LAB — RETICULOCYTES
RBC.: 4.34 MIL/uL (ref 4.22–5.81)
Retic Count, Absolute: 65.1 10*3/uL (ref 19.0–186.0)
Retic Ct Pct: 1.5 % (ref 0.4–3.1)

## 2015-05-22 LAB — IRON AND TIBC
Iron: 47 ug/dL (ref 45–182)
SATURATION RATIOS: 14 % — AB (ref 17.9–39.5)
TIBC: 342 ug/dL (ref 250–450)
UIBC: 295 ug/dL

## 2015-05-22 LAB — SEDIMENTATION RATE: SED RATE: 5 mm/h (ref 0–16)

## 2015-05-22 LAB — CBC WITH DIFFERENTIAL/PLATELET
BASOS ABS: 0 10*3/uL (ref 0.0–0.1)
BASOS PCT: 0 %
EOS ABS: 0.1 10*3/uL (ref 0.0–0.7)
EOS PCT: 1 %
HCT: 38.4 % — ABNORMAL LOW (ref 39.0–52.0)
Hemoglobin: 13.4 g/dL (ref 13.0–17.0)
LYMPHS PCT: 17 %
Lymphs Abs: 1.1 10*3/uL (ref 0.7–4.0)
MCH: 30.9 pg (ref 26.0–34.0)
MCHC: 34.9 g/dL (ref 30.0–36.0)
MCV: 88.5 fL (ref 78.0–100.0)
Monocytes Absolute: 0.5 10*3/uL (ref 0.1–1.0)
Monocytes Relative: 7 %
Neutro Abs: 5 10*3/uL (ref 1.7–7.7)
Neutrophils Relative %: 75 %
PLATELETS: 155 10*3/uL (ref 150–400)
RBC: 4.34 MIL/uL (ref 4.22–5.81)
RDW: 12.3 % (ref 11.5–15.5)
WBC: 6.7 10*3/uL (ref 4.0–10.5)

## 2015-05-22 LAB — VITAMIN B12: Vitamin B-12: 399 pg/mL (ref 180–914)

## 2015-05-22 LAB — FOLATE: FOLATE: 13.4 ng/mL (ref >5.9)

## 2015-05-22 LAB — FERRITIN: FERRITIN: 63 ng/mL (ref 24–336)

## 2015-05-22 LAB — C-REACTIVE PROTEIN

## 2015-05-22 LAB — LACTATE DEHYDROGENASE: LDH: 132 U/L (ref 98–192)

## 2015-05-22 NOTE — Addendum Note (Signed)
Addended by: Patrici Ranks on: 05/22/2015 05:12 PM   Modules accepted: Level of Service

## 2015-05-22 NOTE — Patient Instructions (Addendum)
[place] at [facility] Discharge Instructions  RECOMMENDATIONS MADE BY THE CONSULTANT AND ANY TEST RESULTS WILL BE SENT TO YOUR REFERRING PHYSICIAN.    Exam and discussion by Dr Whitney Muse today You were sent here because your blood counts were low. Platelets, white blood cells, and hemoglobin are low. Blood work today  We start with peripheral blood to see if we can figure out why his counts are low. If this blood work shows nothing then we will look at where the blood is made in the bone marrow. Return to see the doctor in 3-4 weeks As your blood work comes back we will call you.  Please call the clinic if you have any questions or concerns       Thank you for choosing [place] at [facility] to provide your oncology and hematology care.  To afford each patient quality time with our provider, please arrive at least 15 minutes before your scheduled appointment time.   Beginning January 23rd 2017 lab work for the [name] will be done in the  Main lab at [facility] on 1st floor. If you have a lab appointment with the [facility] please come in thru the  Main Entrance and check in at the main information desk  You need to re-schedule your appointment should you arrive 10 or more minutes late.  We strive to give you quality time with our providers, and arriving late affects you and other patients whose appointments are after yours.  Also, if you no show three or more times for appointments you may be dismissed from the clinic at the providers discretion.     Again, thank you for choosing [facility].  Our hope is that these requests will decrease the amount of time that you wait before being seen by our physicians.       _____________________________________________________________  Should you have questions after your visit to [facility], please contact our office at [phone] between the hours of  8:30 a.m. and 4:30 p.m.  Voicemails left after 4:30 p.m. will not be returned until the following business day.  For prescription refill requests, have your pharmacy contact our office.         Resources For Cancer Patients and their Caregivers ? American Cancer Society: Can assist with transportation, wigs, general needs, runs Look Good Feel Better.        947-252-0233 ? Cancer Care: Provides financial assistance, online support groups, medication/co-pay assistance.  1-800-813-HOPE 949-005-5696) ? [place] Assists [place] cancer patients and their families through emotional , educational and financial support.  (857) 073-9082 ? [place] DSS Where to apply for food stamps, Medicaid and utility assistance. 630-119-5213 ? [facility]: Transportation to medical appointments. 573-731-8041 ? Social Security Administration: May apply for disability if have a Stage IV cancer. 410-266-4479 417-690-8213 ? [employer], Disability and Transit Services: Assists with nutrition, care and transit needs. 906-047-1631
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
## 2015-05-23 LAB — IGG, IGA, IGM
IGG (IMMUNOGLOBIN G), SERUM: 1253 mg/dL (ref 700–1600)
IgA: 161 mg/dL (ref 61–437)
IgM, Serum: 33 mg/dL (ref 20–172)

## 2015-05-23 LAB — HAPTOGLOBIN: HAPTOGLOBIN: 89 mg/dL (ref 34–200)

## 2015-05-24 LAB — ANGIOTENSIN CONVERTING ENZYME: Angiotensin-Converting Enzyme: 48 U/L (ref 14–82)

## 2015-05-25 LAB — IMMUNOFIXATION ELECTROPHORESIS
IGM, SERUM: 33 mg/dL (ref 20–172)
IgA: 164 mg/dL (ref 61–437)
IgG (Immunoglobin G), Serum: 1252 mg/dL (ref 700–1600)
TOTAL PROTEIN ELP: 7.1 g/dL (ref 6.0–8.5)

## 2015-05-25 LAB — PROTEIN ELECTROPHORESIS, SERUM
A/G RATIO SPE: 1.3 (ref 0.7–1.7)
ALBUMIN ELP: 3.8 g/dL (ref 2.9–4.4)
ALPHA-1-GLOBULIN: 0.2 g/dL (ref 0.0–0.4)
ALPHA-2-GLOBULIN: 0.5 g/dL (ref 0.4–1.0)
BETA GLOBULIN: 0.9 g/dL (ref 0.7–1.3)
Gamma Globulin: 1.2 g/dL (ref 0.4–1.8)
Globulin, Total: 2.9 g/dL (ref 2.2–3.9)
Total Protein ELP: 6.7 g/dL (ref 6.0–8.5)

## 2015-05-25 LAB — KAPPA/LAMBDA LIGHT CHAINS
KAPPA, LAMDA LIGHT CHAIN RATIO: 1.51 (ref 0.26–1.65)
Kappa free light chain: 13.69 mg/L (ref 3.30–19.40)
LAMDA FREE LIGHT CHAINS: 9.09 mg/L (ref 5.71–26.30)

## 2015-05-25 LAB — PATHOLOGIST SMEAR REVIEW

## 2015-05-26 ENCOUNTER — Other Ambulatory Visit: Payer: Self-pay | Admitting: Internal Medicine

## 2015-06-07 ENCOUNTER — Telehealth: Payer: Self-pay | Admitting: Gastroenterology

## 2015-06-07 NOTE — Telephone Encounter (Signed)
Please call pt. His stomach Bx shows gastritis BUT NO SARCOID. GASTRITIS MAY CAUSE EPIGASTRIC PAIN AND IS LIKELY DUE TO NAPROXEN AND PREDNISONE.   DRINK WATER TO KEEP YOUR URINE LIGHT YELLOW.  AVOID FOOD THAT MAY TRIGGER GASTRITIS.  FOLLOW A LOW FAT DIET.  AVOID FRIED FOODS. MEATS SHOULD BE BAKED, BROILED, OR BOILED.   CONTINUE NEXIUM. TAKE 30 MINUTES BEFORE MEALS TWICE DAILY.  FOLLOW UP IN 4 MOS E30 DYSPHAGIA/EPIGASTRIC PAIN/GASTRITIS.

## 2015-06-08 NOTE — Telephone Encounter (Signed)
I called and informed pt's mom. She is concerned because the pt is still having problems with his right arm and hand and he has to hold it up to keep it from burning. She said that pt was referred to Dr. Whitney Muse for that . I told her that he should follow up with Dr. Whitney Muse if that was the case, and I will let Dr. Oneida Alar know.

## 2015-06-08 NOTE — Telephone Encounter (Signed)
APPT MADE

## 2015-06-09 NOTE — Telephone Encounter (Signed)
PLEASE CALL PT. PER [facility] HE SHOULD FOLLOW UP WITH NEUROLOGY AND ORTHO FOR R ARM, NOT DR. PENLAND-HEMATOLOGY.

## 2015-06-09 NOTE — Telephone Encounter (Signed)
LMOM to call.

## 2015-06-10 NOTE — Telephone Encounter (Signed)
Pt's mom is aware. She said he will need referrals to neurology and ortho.

## 2015-06-11 ENCOUNTER — Other Ambulatory Visit: Payer: Self-pay

## 2015-06-11 DIAGNOSIS — M25529 Pain in unspecified elbow: Secondary | ICD-10-CM

## 2015-06-11 DIAGNOSIS — M79601 Pain in right arm: Secondary | ICD-10-CM

## 2015-06-11 NOTE — Telephone Encounter (Signed)
LMOM for a return call.  

## 2015-06-11 NOTE — Telephone Encounter (Signed)
PLEASE CALL PT'S MOTHER. HE ALREADY HAS A NEUROLOGY DOCTOR HE JUST NEED TO CALL AND MAKE A FOLLOW UP APPT. WE WILL REFER HIM TO DR. HARRISON FOR RIGHT ARM PAIN.

## 2015-06-12 NOTE — Telephone Encounter (Signed)
Referral sent to Dr. Ruthe Mannan office via fax

## 2015-06-15 NOTE — Telephone Encounter (Signed)
Called pt and could not leave a message, Vm is full.

## 2015-06-15 NOTE — Telephone Encounter (Signed)
Mailed a letter of the info.

## 2015-06-19 ENCOUNTER — Encounter (HOSPITAL_COMMUNITY): Payer: Medicare Other | Attending: Hematology & Oncology | Admitting: Hematology & Oncology

## 2015-06-19 ENCOUNTER — Encounter (HOSPITAL_COMMUNITY): Payer: Self-pay | Admitting: Hematology & Oncology

## 2015-06-19 VITALS — BP 161/91 | HR 85 | Temp 98.8°F | Resp 16 | Wt 159.5 lb

## 2015-06-19 DIAGNOSIS — D61818 Other pancytopenia: Secondary | ICD-10-CM

## 2015-06-19 DIAGNOSIS — D509 Iron deficiency anemia, unspecified: Secondary | ICD-10-CM

## 2015-06-19 DIAGNOSIS — D649 Anemia, unspecified: Secondary | ICD-10-CM

## 2015-06-19 DIAGNOSIS — E611 Iron deficiency: Secondary | ICD-10-CM

## 2015-06-19 DIAGNOSIS — D869 Sarcoidosis, unspecified: Secondary | ICD-10-CM | POA: Diagnosis not present

## 2015-06-19 MED ORDER — POLYSACCHARIDE IRON COMPLEX 150 MG PO CAPS: 150.0000 mg | ORAL_CAPSULE | Freq: Every day | ORAL | Status: DC

## 2015-06-19 NOTE — Patient Instructions (Signed)
[place] at [facility] Discharge Instructions  RECOMMENDATIONS MADE BY THE CONSULTANT AND ANY TEST RESULTS WILL BE SENT TO YOUR REFERRING PHYSICIAN.   Exam and discussion by Dr Whitney Muse today Blood work was normal Iron was just a little low niferex called into pharmacy, take this once a day Blood work in 10 weeks Return to see the doctor in 10 weeks Referral to Dr Krista Blue at [facility] neurology, they will call you with this appt  Please call the clinic if you have any questions or concerns     Thank you for choosing [place] at [facility] to provide your oncology and hematology care.  To afford each patient quality time with our provider, please arrive at least 15 minutes before your scheduled appointment time.   Beginning January 23rd 2017 lab work for the [name] will be done in the  Main lab at [facility] on 1st floor. If you have a lab appointment with the [facility] please come in thru the  Main Entrance and check in at the main information desk  You need to re-schedule your appointment should you arrive 10 or more minutes late.  We strive to give you quality time with our providers, and arriving late affects you and other patients whose appointments are after yours.  Also, if you no show three or more times for appointments you may be dismissed from the clinic at the providers discretion.     Again, thank you for choosing [facility].  Our hope is that these requests will decrease the amount of time that you wait before being seen by our physicians.       _____________________________________________________________  Should you have questions after your visit to [facility], please contact our office at [phone] between the hours of 8:30 a.m. and 4:30 p.m.  Voicemails left after 4:30 p.m. will not be returned until the following business day.  For prescription refill requests, have your pharmacy  contact our office.         Resources For Cancer Patients and their Caregivers ? American Cancer Society: Can assist with transportation, wigs, general needs, runs Look Good Feel Better.        4126253280 ? Cancer Care: Provides financial assistance, online support groups, medication/co-pay assistance.  1-800-813-HOPE 386-705-3457) ? [place] Assists [place] cancer patients and their families through emotional , educational and financial support.  520-459-8320 ? [place] DSS Where to apply for food stamps, Medicaid and utility assistance. 507-121-8732 ? [facility]: Transportation to medical appointments. (307)345-4037 ? Social Security Administration: May apply for disability if have a Stage IV cancer. 734 481 3379 515-478-9982 ? [employer], Disability and Transit Services: Assists with nutrition, care and transit needs. (567)459-0183
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
## 2015-06-22 DIAGNOSIS — R350 Frequency of micturition: Secondary | ICD-10-CM | POA: Diagnosis not present

## 2015-06-22 DIAGNOSIS — R3129 Other microscopic hematuria: Secondary | ICD-10-CM | POA: Diagnosis not present

## 2015-06-22 DIAGNOSIS — N319 Neuromuscular dysfunction of bladder, unspecified: Secondary | ICD-10-CM | POA: Diagnosis not present

## 2015-06-29 ENCOUNTER — Encounter: Payer: Self-pay | Admitting: Neurology

## 2015-06-29 ENCOUNTER — Ambulatory Visit (INDEPENDENT_AMBULATORY_CARE_PROVIDER_SITE_OTHER): Payer: Medicare Other | Admitting: Neurology

## 2015-06-29 VITALS — BP 138/68 | HR 82 | Ht 64.0 in | Wt 160.0 lb

## 2015-06-29 DIAGNOSIS — M79601 Pain in right arm: Secondary | ICD-10-CM | POA: Diagnosis not present

## 2015-06-29 DIAGNOSIS — I63311 Cerebral infarction due to thrombosis of right middle cerebral artery: Secondary | ICD-10-CM | POA: Diagnosis not present

## 2015-06-29 DIAGNOSIS — G5691 Unspecified mononeuropathy of right upper limb: Secondary | ICD-10-CM | POA: Insufficient documentation

## 2015-06-29 MED ORDER — GABAPENTIN 300 MG PO CAPS: 300.0000 mg | ORAL_CAPSULE | Freq: Three times a day (TID) | ORAL | Status: DC
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
## 2015-06-29 NOTE — Patient Instructions (Signed)
[facility]  Orthotics & Prosthetics Service  [address]  [phone]  

## 2015-07-01 ENCOUNTER — Telehealth: Payer: Self-pay | Admitting: Neurology

## 2015-07-01 ENCOUNTER — Ambulatory Visit: Payer: Medicare Other | Admitting: Orthopaedic Surgery

## 2015-07-01 NOTE — Telephone Encounter (Signed)
Sherrin Daisy was concerned gabapentin was an opioid and I explained to her that it was not in that class of medication.  Says he has taken the gabapentin, as directed, with the exception of taking two capsules at one time when the pain was more severe.  He only did this once.  I instructed her to have him take the gabapentin, as prescribed by Dr. Krista Blue, and to call us with any concerns prior to him taking the medication a different way.  Says she is concerned he will not take it correctly so I suggested she help monitor his medications, for his safety.  She verbalized understanding.
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
## 2015-07-09 DIAGNOSIS — H40003 Preglaucoma, unspecified, bilateral: Secondary | ICD-10-CM | POA: Diagnosis not present

## 2015-07-09 DIAGNOSIS — H2513 Age-related nuclear cataract, bilateral: Secondary | ICD-10-CM | POA: Diagnosis not present

## 2015-07-09 DIAGNOSIS — E119 Type 2 diabetes mellitus without complications: Secondary | ICD-10-CM | POA: Diagnosis not present

## 2015-08-04 ENCOUNTER — Ambulatory Visit: Payer: Medicare Other | Admitting: Internal Medicine

## 2015-08-10 ENCOUNTER — Encounter: Payer: Self-pay | Admitting: Internal Medicine

## 2015-08-10 ENCOUNTER — Ambulatory Visit (INDEPENDENT_AMBULATORY_CARE_PROVIDER_SITE_OTHER): Payer: Medicare Other | Admitting: Internal Medicine

## 2015-08-10 VITALS — BP 126/82 | HR 68 | Wt 155.0 lb

## 2015-08-10 DIAGNOSIS — J449 Chronic obstructive pulmonary disease, unspecified: Secondary | ICD-10-CM

## 2015-08-10 DIAGNOSIS — I63311 Cerebral infarction due to thrombosis of right middle cerebral artery: Secondary | ICD-10-CM

## 2015-08-10 DIAGNOSIS — D869 Sarcoidosis, unspecified: Secondary | ICD-10-CM

## 2015-08-10 NOTE — Progress Notes (Signed)
Subjective:   Patient ID: Charles [name], male    DOB: November 06, 1955    MRN: [id number]  Brief patient profile:  60 yowm dx  Active smoker first aware of sob age 60 referred to pulmonary clinic 10/11/12 with dx of sarcoid and atypical sob ? acei effect extremely difficult to maintain med reconciliation as he is both deaf and very stubborn re taking care of his own meds    History of Present Illness   [name] -Interpretor  Hx of CVA w/ hemiplegia   10/11/2012  1st [facility] pulmonary eval cc short of breath ? When did you first notice it ?  Mother "well the doctor's discovered" it dx with sarcoid in [place] and placed on prednisone since decades and maintained on prednisone and prinizil.  He was able to play sports in high school but mother says ?always needed advair (note advair wasn't made until he was 75) "well he was always on some pump" (interesting comment as he showed no capacity at all to use hfa).  Thru the interpreter after mutliple back and forths and asking the mother to let him answer the question, he has been variably sob since age 60, sometimes can't get across the room s giving out but never at rest, never sleeping, assoc with dry cough ? Since when  > on ACEi  Dx and rx as sarcoid by Charles [name] pulmonary doc on pred 10 mg per day but not clear whether ever changed his breathing for the better and so maint on pred 10 mg per day  >>D/C acei and advair    05/13/2014 f/u ov/Charles [name] re: sarcoid/ chronically steroid dep  Chief Complaint  Patient presents with  . Follow-up    Pt c/o incr SOB and dryness in chest when breathing. Pt does not feel that Prednisone is working. Pt reports changing diet some since last OV.   maintained on 10 mg daily  No worse cough  "brought all meds"  =  No proair  Pt has assumed all med management and taking ppi incorrectly and pred 10 where it was one half Pt nor fm using the med calendar format rec Please remember to go to the x-ray department downstairs  for your tests - we will call you with the results when they are available. Only use your albuterol as a rescue medication to  [place] to double prednisone if breathing worse between now and the next visit Nexium 40 mg Take 30-60 min should be before first meal of the day     06/13/2014 f/u ov/Charles [name] re: copd GOLD II with reversiblity / did not bring spiriva with him / did bring proair but hfa quite poor/ pred at 10 mg daily   Chief Complaint  Patient presents with  . Follow-up    PFT done today. Breathing has improved. No new co's today.     Still not taking ppi ac  >[id number]  rx    11/04/2014 f/u ov/Charles [name] re: COPD Gold II with reversibility/ pred at 10 mg daily/ dulera on 0 didn't know it. Chief Complaint  Patient presents with  . Follow-up    Pt states that his breathing is unchanged since the last visit. He is using rescue inhaler   limited more by back than breathing' very poor hfa/ dulera completely empty, pt did not know when the counter went to 0 rec No change in medications Work on inhaler technique:    You will need to let your family listen to the way you  inhale to be sure your are doing it effectively  Please schedule a follow up visit in 3 months but call sooner if needed with all inhalers in hand     02/03/2015  f/u ov/Charles [name] re: GOLD II/ sarcoid rx =  prednisone 10 / dulera but verypoor hfa - did not bring all inhalers as rec/ pred at 10 mg daily  Chief Complaint  Patient presents with  . Follow-up    Pt states that he has been smoking about once a month and recently did have some SOB afterward that did improve with albuterol HFA use. Pt denies any current cough/wheeze/SOB/CP/tightness. Pt states he does use his inhalers as directed.    very confusing again as to exactly what he takes. I don't believe he's been using Spiriva at all. On the other hand he actually looks pretty good and is really not limited by his breathing at this point. rec Ok to leave off spiriva if you  don't think it's helping  Work on inhaler technique:     05/04/2015  f/u ov/Novelle [name] re: GOLD II copd/ sarcoid on prednisone 10 mg daily and dulera 200 bid with hfa close to 0% effective  Chief Complaint  Patient presents with  . Follow-up    pt doing well, does note some L sided sinus congestion with yellow mucus on occasion.    Not limited by breathing from desired activities rec Work on inhaler technique:    I called in a new strength of dulera = 100 Take 2 puffs first thing in am and then another 2 puffs about 12 hours later.  Augmentin 875 mg take one pill twice daily  X 10 days   Nasal saline is excellent to help with nasal congestion      08/10/2015  f/u ov/Charles [name] re: G II COPD/sarcoid/  dulera 100 bid / pred 1 mg daily  Chief Complaint  Patient presents with  . Follow-up    Breathing is doing well. He has occ cough with yellow sputum.   not clear how long he's been on 1 mg pred instead of 10 mg per day but tol well despite very poor hfa tehnique/ sleeping fine as well    No obvious day to day or daytime variability or assoc  cp or chest tightness, subjective wheeze or overt   hb symptoms. No unusual exp hx or h/o childhood pna/ asthma or knowledge of premature birth.  Sleeping ok without nocturnal  or early am exacerbation  of respiratory  c/o's or need for noct saba. Also denies any obvious fluctuation of symptoms with weather or environmental changes or other aggravating or alleviating factors except as outlined above   Current Medications, Allergies, Complete Past Medical History, Past Surgical History, Family History, and Social History were reviewed in [employer] record.  ROS  The following are not active complaints unless bolded sore throat, dysphagia, dental problems, itching, sneezing,  nasal congestion or excess/ purulent secretions, ear ache,   fever, chills, sweats, unintended wt loss, classically pleuritic or exertional cp, hemoptysis,  orthopnea pnd  or leg swelling, presyncope, palpitations, abdominal pain, anorexia, nausea, vomiting, diarrhea  or change in bowel or bladder habits, change in stools or urine, dysuria,hematuria,  rash, arthralgias, visual complaints, headache, numbness, weakness or ataxia or problems with walking or coordination walks with cane very slowly at home,  change in mood/affect or memory.                  Objective:  Physical Exam  amb bm  Deaf  [place] walks with 4 pronged crutch  08/08/2013  170 > 172 7/16 >  10/17/2013 168 >171 01/13/2014 > 05/13/2014  167>06/13/2014   172 >176 07/01/2014 > 11/04/2014 177 > .02/04/2015 179 > 05/04/2015    167 > 08/10/2015  155   Interpretor in room /Deaf   HEENT: nl dentition, turbinates, and orophanx. Nl external ear canals without cough reflex   NECK :  without JVD/Nodes/TM/ nl carotid upstrokes bilaterally   LUNGS: no acc muscle use, clear to A and P bilaterally without cough on insp or exp maneuvers   CV:  RRR  no s3 or murmur or increase in P2, no edema   ABD:  soft and nontender with nl excursion in the supine position. No bruits or organomegaly, bowel sounds nl  MS:  warm without deformities, calf tenderness, cyanosis or clubbing  SKIN: warm and dry without lesions      I personally reviewed images and agree with radiology impression as follows:  CXR:  04/17/15  Evidence of prior granulomatous disease. Areas of scarring bilaterally, stable. No edema or consolidation.          Assessment & Plan:

## 2015-08-10 NOTE — Patient Instructions (Addendum)
Please schedule a follow up visit in 6 months but call sooner if needed for breathing issues but bring all inhalers

## 2015-08-11 ENCOUNTER — Encounter: Payer: Self-pay | Admitting: Internal Medicine

## 2015-08-11 NOTE — Assessment & Plan Note (Signed)
-   ex intol since age 60 - trial off acei 10/12/2012  - 11/19/2012   Walked RA x one lap @ 185 stopped due to  Legs gave out no desat  - trial of breo samples 11/19/2012 >> improved on breo/spiriva 04/01/2013 > try off spiriva 05/14/2013 due to dry mouth> no worse 08/09/2013  - Spirometry 11/19/2012  FEV1  1.29 (49) ratio 55  - Spirometry 10/17/2013   FEV1  1.17 (44%) ratio 55 off breo x one week and prednisone at 5 mg daily > d/c BREO as pt not convinced help - 10/17/2013   Walked RA x one lap @ 185 stopped due to fatigue not sob  - PFTs  06/13/2014  FEV1 1.47(60) ratio 53 with 30% improvement   dlco 65 improves to 109 p alv vol while on pred 10 mg daily  - 06/13/2014 p extensive coaching HFA effectiveness =    50% so try dulera 200 2 twice daily  - 11/04/2014  hfa < 25 % but pt doing well on dulera 200 so rec continue for now  - 05/04/2015  extensive coaching HFA effectiveness =    0% so no need for the high dose of dulera > try 100 2bid > no worse symptoms  08/10/2015    I strongly suspect the dulera is nothing but a placebo here but pt convinced it's helping so no change in rx needed   I had an extended discussion with the patient via interpreter  reviewing all relevant studies completed to date and  lasting 15 to 20 minutes of a 25 minute visit    Each maintenance medication was reviewed in detail  including most importantly the difference between maintenance and prns and under what circumstances the prns are to be triggered using an action plan format that is not reflected in the computer generated alphabetically organized AVS.    Please see instructions for details which were reviewed in writing and the patient given a copy highlighting the part that I personally wrote and discussed at today's ov. [name]

## 2015-08-11 NOTE — Assessment & Plan Note (Signed)
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

## 2015-08-12 ENCOUNTER — Encounter: Payer: Medicare Other | Admitting: Neurology

## 2015-08-12 ENCOUNTER — Ambulatory Visit (INDEPENDENT_AMBULATORY_CARE_PROVIDER_SITE_OTHER): Payer: Medicare Other | Admitting: Neurology

## 2015-08-12 DIAGNOSIS — G8114 Spastic hemiplegia affecting left nondominant side: Secondary | ICD-10-CM

## 2015-08-12 DIAGNOSIS — I63311 Cerebral infarction due to thrombosis of right middle cerebral artery: Secondary | ICD-10-CM | POA: Diagnosis not present

## 2015-08-12 DIAGNOSIS — G5691 Unspecified mononeuropathy of right upper limb: Secondary | ICD-10-CM

## 2015-08-12 DIAGNOSIS — M542 Cervicalgia: Secondary | ICD-10-CM | POA: Diagnosis not present

## 2015-08-12 DIAGNOSIS — M5441 Lumbago with sciatica, right side: Secondary | ICD-10-CM | POA: Diagnosis not present

## 2015-08-12 DIAGNOSIS — R269 Unspecified abnormalities of gait and mobility: Secondary | ICD-10-CM | POA: Diagnosis not present

## 2015-08-12 DIAGNOSIS — M545 Low back pain, unspecified: Secondary | ICD-10-CM | POA: Insufficient documentation

## 2015-08-12 MED ORDER — TRAMADOL HCL 50 MG PO TABS: 50.0000 mg | ORAL_TABLET | Freq: Three times a day (TID) | ORAL | Status: DC | PRN

## 2015-08-12 MED ORDER — MELOXICAM 7.5 MG PO TABS: 7.5000 mg | ORAL_TABLET | Freq: Two times a day (BID) | ORAL | Status: DC

## 2015-08-12 NOTE — Procedures (Signed)
   NCS (NERVE CONDUCTION STUDY) WITH EMG (ELECTROMYOGRAPHY) REPORT   STUDY DATE: August 12 2015 PATIENT NAME: Charles [name] DOB: Sep 13, 1955 MRN: [id number]    TECHNOLOGIST: Laretta Alstrom ELECTROMYOGRAPHER: Marcial Pacas M.D.  CLINICAL INFORMATION:  60 years old male, with history of stroke, with residual spastic left hemiparesis, presented with progressive worsening gait difficulty, complains of severe low back pain, radiating pain to right lower extremity, neck stiffness, right hand paresthesia involving right lateral 2 fingers, also noticed right hand weakness,  FINDINGS: NERVE CONDUCTION STUDY: Right ulnar sensory response was absent. Right ulnar motor response showed mildly prolonged distal latency, with significantly decreased to C map amplitude, stimulating at distal side, further amplitude drop of 44% across right elbow, stimulating proximal side, there was also treated significant slow conduction velocity below and above right elbow.  Left ulnar sensory and motor responses were normal. Bilateral medial sensory response showed mild to moderately prolonged peak latency with well preserved snap amplitude.  Right median motor responses showed moderately prolonged distal latency, with preserved C map amplitude, normal conduction velocity, with mildly prolonged F-wave latency.  Left median motor responses showed moderately prolonged distal latency, with mildly decreased to C map amplitude.   Right peroneal sensory response was normal. Right peroneal to EDB and tibial motor responses were normal.  NEEDLE ELECTROMYOGRAPHY: Selected needle examinations was performed at right upper, lower extremity muscles, right cervical, and lumbar sacral paraspinal muscles.   Right first dorsal interossei, abductor digiti quinti: Increased insertional activity, 2 plus spontaneous activity, enlarged complex motor unit potential with decreased recruitment patterns.  Right flexor carpi ulnaris: Increased  insertional activity, 1 plus spontaneous activity, mixture of normal, some enlarged complex motor unit potential with mildly decreased recruitment patterns.  Right extensor digitorum communis, pronator teres, biceps, triceps, deltoid: Normal insertion activity, no spontaneous activity, normal morphology motor unit potential, with normal recruitment patterns.  There was no spontaneous activity at right cervical paraspinal muscles, right C5-C6 and 7.  Right tibialis anterior, peroneal longus, tibialis posterior: Increased insertional activity, 1-2 plus spontaneous activity, enlarge complex motor unit potential with decreased recruitment patterns.  Right medial gastrocnemius, biceps femoris short head, biceps femoris long head: Increased insertional activity, no spontaneous activity, mixture of normal some enlarged complex motor unit potential with mildly decreased recruitment patterns.  Right gluteus medius: Normal insertional activity, no spontaneous activity, mixture of normal some enlarged motor unit potential with mildly decreased recruitment patterns.  He has poor relaxation on the needle examination of right lumbosacral paraspinal muscles, the motor unit was noted to be enlarged complex at right L4-5 S1, I was not able to appreciate any spontaneous activity.  IMPRESSION:   This is an abnormal study. There is electrodiagnostic evidence of active right lumbosacral radiculopathy, involving right L4, L5-S1 myotomes. 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 There is also evidence of bilateral median neuropathy across the wrist, right side is moderate, left side is moderate to severe.   INTERPRETING PHYSICIAN:   Marcial Pacas M.D. Ph.D. [facility] [address], [place], [place] [address] [phone]
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
## 2015-08-18 ENCOUNTER — Encounter: Payer: Self-pay | Admitting: Podiatry

## 2015-08-18 ENCOUNTER — Other Ambulatory Visit: Payer: Self-pay | Admitting: Nurse Practitioner

## 2015-08-18 ENCOUNTER — Ambulatory Visit (INDEPENDENT_AMBULATORY_CARE_PROVIDER_SITE_OTHER): Payer: Medicare Other | Admitting: Podiatry

## 2015-08-18 DIAGNOSIS — B351 Tinea unguium: Secondary | ICD-10-CM | POA: Diagnosis not present

## 2015-08-18 DIAGNOSIS — M79676 Pain in unspecified toe(s): Secondary | ICD-10-CM | POA: Diagnosis not present

## 2015-08-18 DIAGNOSIS — M79609 Pain in unspecified limb: Principal | ICD-10-CM

## 2015-08-18 NOTE — Progress Notes (Signed)
Patient ID: MERDITH LUCIBELLO, male   DOB: May 08, 1955, 60 y.o.   MRN: [id number] Complaint:  Visit Type: Patient returns to my office for continued preventative foot care services. Complaint: Patient states" my nails have grown long and thick and become painful to walk and wear shoes" Patient has been diagnosed with DM with no foot complications. The patient presents for preventative foot care services. No changes to ROS  Podiatric Exam: Vascular: dorsalis pedis and posterior tibial pulses are palpable bilateral. Capillary return is immediate. Temperature gradient is WNL. Skin turgor WNL  Sensorium: Normal Semmes Weinstein monofilament test. Normal tactile sensation bilaterally. Nail Exam: Pt has thick disfigured discolored nails with subungual debris noted bilateral entire nail hallux  Ulcer Exam: There is no evidence of ulcer or pre-ulcerative changes or infection. Orthopedic Exam: Muscle tone and strength are WNL. No limitations in general ROM. No crepitus or effusions noted. Foot type and digits show no abnormalities. Bony prominences are unremarkable. Skin: No Porokeratosis. No infection or ulcers  Diagnosis:  Onychomycosis, , Pain in right toe, pain in left toes  Treatment & Plan Procedures and Treatment: Consent by patient was obtained for treatment procedures. The patient understood the discussion of treatment and procedures well. All questions were answered thoroughly reviewed. Debridement of mycotic and hypertrophic toenails, 1 through 5 bilateral and clearing of subungual debris. No ulceration, no infection noted.  Return Visit-Office Procedure: Patient instructed to return to the office for a follow up visit 3 months for continued evaluation and treatment.   Gardiner Barefoot DPM

## 2015-08-19 ENCOUNTER — Ambulatory Visit: Payer: Medicare Other | Admitting: Orthopaedic Surgery

## 2015-08-21 ENCOUNTER — Ambulatory Visit
Admission: RE | Admit: 2015-08-21 | Discharge: 2015-08-21 | Disposition: A | Discharge status: Home / Self Care | Payer: Medicare Other | Source: Ambulatory Visit | Attending: Neurology | Admitting: Neurology

## 2015-08-21 ENCOUNTER — Telehealth: Payer: Self-pay | Admitting: Neurology

## 2015-08-21 DIAGNOSIS — M542 Cervicalgia: Secondary | ICD-10-CM | POA: Diagnosis not present

## 2015-08-21 DIAGNOSIS — G8114 Spastic hemiplegia affecting left nondominant side: Secondary | ICD-10-CM

## 2015-08-21 DIAGNOSIS — M4802 Spinal stenosis, cervical region: Secondary | ICD-10-CM

## 2015-08-21 DIAGNOSIS — M5441 Lumbago with sciatica, right side: Secondary | ICD-10-CM | POA: Diagnosis not present

## 2015-08-21 DIAGNOSIS — I63311 Cerebral infarction due to thrombosis of right middle cerebral artery: Secondary | ICD-10-CM

## 2015-08-21 DIAGNOSIS — R269 Unspecified abnormalities of gait and mobility: Secondary | ICD-10-CM

## 2015-08-21 DIAGNOSIS — M5126 Other intervertebral disc displacement, lumbar region: Secondary | ICD-10-CM | POA: Diagnosis not present

## 2015-08-21 NOTE — Telephone Encounter (Signed)
  Please call patient, MRI of cervical spine showed evidence of severe spinal stenosis at C3 5, C4-5, there is evidence of spinal cord damage, this will likely explaining his worsening gait difficulty, MRI of lumbar spine showed multilevel degenerative disc disease  I have referred him to neurosurgeon for potential cervical decompression surgery,  If I have early appointment open, please give him an earlier follow-up appointment   IMPRESSION: This MRI of the cervical spine shows the following 1. There is severe spinal stenosis at C3-C4 and at C4-C5 due to central disc protrusions/herniations, uncovertebral spurring, facet hypertrophy and congenitally short pedicles. There is subtle hyperintense signal within the spinal cord on sagittal STIR images just below the C3-C4 interspace but this is not confirmed on axial images. This could represent a mild cervical myelopathy. 2. There is moderate spinal stenosis at C5-C6 and C6-C7 mild spinal stenosis at C2-C3 to degenerative changes and congenitally short pedicles. 3. At C3-C4 there is moderately severe bilateral foraminal narrowing at could lead to impingement either of the C4 nerve roots. There does not appear to be nerve root impingement at the other cervical levels.   IMPRESSION: This is an abnormal MRI of the lumbar spine showing the following: 1. At L4-L5, there is broad disc bulging, moderately severe facet hypertrophy, right greater than left, and minimal anterolisthesis. There is no nerve root compression at this level. When compared to the MRI dated 12/24/2012, the synovial cyst that was noted to the left on the prior MRI is no longer present. This relieves the left L5 nerve root impingement that was noted at that time. However, since the prior MRI there has been mild progression of the facet hypertrophy and minimal anterolisthesis . 2. There are milder degenerative changes at L3-L4 and L5-S1 that did not lead to any nerve root  impingement, unchanged when compared to the previous MRI.

## 2015-08-24 ENCOUNTER — Telehealth: Payer: Self-pay | Admitting: Neurology

## 2015-08-24 ENCOUNTER — Ambulatory Visit (INDEPENDENT_AMBULATORY_CARE_PROVIDER_SITE_OTHER): Payer: Medicare Other | Admitting: Neurology

## 2015-08-24 ENCOUNTER — Encounter: Payer: Self-pay | Admitting: Neurology

## 2015-08-24 VITALS — BP 147/90 | HR 72

## 2015-08-24 DIAGNOSIS — I63311 Cerebral infarction due to thrombosis of right middle cerebral artery: Secondary | ICD-10-CM | POA: Diagnosis not present

## 2015-08-24 DIAGNOSIS — M4802 Spinal stenosis, cervical region: Secondary | ICD-10-CM | POA: Diagnosis not present

## 2015-08-24 DIAGNOSIS — G8114 Spastic hemiplegia affecting left nondominant side: Secondary | ICD-10-CM | POA: Diagnosis not present

## 2015-08-24 DIAGNOSIS — R269 Unspecified abnormalities of gait and mobility: Secondary | ICD-10-CM | POA: Diagnosis not present

## 2015-08-24 NOTE — Telephone Encounter (Signed)
Interpreter services contacted and appointment confirmed with them.
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
## 2015-08-24 NOTE — Telephone Encounter (Signed)
Patient is deaf. Spoke to patient's mother, Charles [name] (on HIP[employer]), who is aware of results.  A follow up appointment has been scheduled for today.

## 2015-08-24 NOTE — Telephone Encounter (Signed)
Patient's mother called to confirm that her son will be here today and needs and enterpreter.

## 2015-08-26 ENCOUNTER — Telehealth: Payer: Self-pay | Admitting: Gastroenterology

## 2015-08-26 NOTE — Telephone Encounter (Signed)
[phone]  PATIENT MOTHER CALLED WANTING [facility] TO REVIEW HIS MRI THAT HE HAD DONE.  SHE WANTS HER OPINION ON HER SON

## 2015-08-26 NOTE — Telephone Encounter (Signed)
I called pt's mom. She said Dr. Oneida Alar sent her son to a neurologist, and they ordered an MRI of his cervical spine. ( Results are in epic)  She said they called her to the office on 08/23/2015 to discuss results and to Refer him to a neurosurgeon.  She is so concerned about her son because of his condition and also because she had back surgery years ago and has had to walk with a can all the time since then.  She was hoping Dr. Oneida Alar could review pt's MRI and discuss with her.  She is aware that Dr. Oneida Alar is away the remainder of this week and all of next week.  I told her that Dr. Oneida Alar had confidence in the doctor she referred the pt to and I'm sure she would feel that doctor would do his best in referral to the neurosurgeon.   She will try to discuss with neurosurgeon when the appt is made. Some in the family are very concerned and do not want the pt to have back surgery. I told her she should hear what the neurosurgeon has to say.  She is aware I will send Dr. Oneida Alar this message for her to review and give her a call when she is back at the office/hospital.

## 2015-08-27 NOTE — Telephone Encounter (Signed)
PLEASE CALL PT'S MOTHER. I REVIEWED THE MRI REPORT. HIS MRI SHOWS MODERATE TO SEVERE  SPINAL STENOSIS IN HIS NECK. HIS UPPER EXTREMITY SX WILL LIKELY IMPROVE WITH SURGERY. SHE SHOULD SEE NEUROSURGERY TO DISCUSS THE BENEFITS V. RISKS OF SURGERY, BUT IT LOOKS LIKE SURGERY CAN HELP HER SON.

## 2015-08-27 NOTE — Telephone Encounter (Signed)
Pt's mom is aware.  

## 2015-08-28 ENCOUNTER — Other Ambulatory Visit (HOSPITAL_COMMUNITY): Payer: Medicare Other

## 2015-08-28 ENCOUNTER — Ambulatory Visit (HOSPITAL_COMMUNITY): Payer: Medicare Other | Admitting: Hematology & Oncology

## 2015-08-31 ENCOUNTER — Ambulatory Visit: Payer: Medicare Other | Admitting: Gastroenterology

## 2015-08-31 DIAGNOSIS — H40003 Preglaucoma, unspecified, bilateral: Secondary | ICD-10-CM | POA: Diagnosis not present

## 2015-09-07 ENCOUNTER — Ambulatory Visit: Payer: Medicare Other | Admitting: Neurology

## 2015-09-11 DIAGNOSIS — I10 Essential (primary) hypertension: Secondary | ICD-10-CM | POA: Diagnosis not present

## 2015-09-11 DIAGNOSIS — Z6826 Body mass index (BMI) 26.0-26.9, adult: Secondary | ICD-10-CM | POA: Diagnosis not present

## 2015-09-14 ENCOUNTER — Other Ambulatory Visit: Payer: Self-pay | Admitting: Neurosurgery

## 2015-09-16 ENCOUNTER — Encounter: Payer: Self-pay | Admitting: *Deleted

## 2015-09-22 ENCOUNTER — Encounter (HOSPITAL_COMMUNITY): Payer: Medicare Other | Attending: Hematology & Oncology | Admitting: Hematology & Oncology

## 2015-09-22 ENCOUNTER — Encounter (HOSPITAL_COMMUNITY): Payer: Self-pay | Admitting: Hematology & Oncology

## 2015-09-22 ENCOUNTER — Encounter (HOSPITAL_COMMUNITY): Payer: Medicare Other

## 2015-09-22 VITALS — BP 121/73 | HR 57 | Temp 97.8°F | Resp 16 | Wt 156.1 lb

## 2015-09-22 DIAGNOSIS — D61818 Other pancytopenia: Secondary | ICD-10-CM

## 2015-09-22 DIAGNOSIS — D509 Iron deficiency anemia, unspecified: Secondary | ICD-10-CM | POA: Diagnosis not present

## 2015-09-22 DIAGNOSIS — D869 Sarcoidosis, unspecified: Secondary | ICD-10-CM | POA: Diagnosis not present

## 2015-09-22 DIAGNOSIS — D649 Anemia, unspecified: Secondary | ICD-10-CM

## 2015-09-22 LAB — CBC WITH DIFFERENTIAL/PLATELET
BASOS ABS: 0 10*3/uL (ref 0.0–0.1)
BASOS PCT: 0 %
Eosinophils Absolute: 0.1 10*3/uL (ref 0.0–0.7)
Eosinophils Relative: 3 %
HEMATOCRIT: 36 % — AB (ref 39.0–52.0)
HEMOGLOBIN: 12.4 g/dL — AB (ref 13.0–17.0)
Lymphocytes Relative: 37 %
Lymphs Abs: 1.4 10*3/uL (ref 0.7–4.0)
MCH: 30.4 pg (ref 26.0–34.0)
MCHC: 34.4 g/dL (ref 30.0–36.0)
MCV: 88.2 fL (ref 78.0–100.0)
Monocytes Absolute: 0.3 10*3/uL (ref 0.1–1.0)
Monocytes Relative: 9 %
NEUTROS ABS: 2 10*3/uL (ref 1.7–7.7)
NEUTROS PCT: 51 %
Platelets: 120 10*3/uL — ABNORMAL LOW (ref 150–400)
RBC: 4.08 MIL/uL — AB (ref 4.22–5.81)
RDW: 12.8 % (ref 11.5–15.5)
WBC: 3.9 10*3/uL — AB (ref 4.0–10.5)

## 2015-09-22 LAB — IRON AND TIBC
Iron: 100 ug/dL (ref 45–182)
SATURATION RATIOS: 30 % (ref 17.9–39.5)
TIBC: 335 ug/dL (ref 250–450)
UIBC: 235 ug/dL

## 2015-09-22 LAB — FERRITIN: FERRITIN: 74 ng/mL (ref 24–336)

## 2015-09-22 NOTE — Progress Notes (Signed)
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 male is here because of pancytopenia.  He goes by Charles [name].  Charles [name] is accompanied by his mother, brother, and a sign language interpreter. Our visit today was facilitated by a translator. He ambulates with use of a cane. I personally reviewed and went over laboratory studies with the patient. He continues on niferex without complaint.   The patient is not nervous about his symptoms and nerve damage worsening. He had follow-up with neurology, Charles [name]. There is a discussion about neurosurgery referral for decompression surgery.   He continues to have tremors in his arm.  He is doing fine. He is eating well, and eating healthy food. His appetite is not always great but he still eats well. He denies fevers or feeling sick at all. No bleeding or bruising. No blood in his stool.   MEDICAL HISTORY:  Past Medical History:  Diagnosis Date  . Asthma   . Essential hypertension, benign   . GERD (gastroesophageal reflux disease)   . Hearing impaired   . History of stroke    Spastic hemiplegia, right MCA stroke April 2014 in [place]  . Mixed hyperlipidemia   . PAD (peripheral artery disease) ([place])   . Sarcoidosis ([place])   . Stroke ([place])   . Type 2 diabetes mellitus ([place])     SURGICAL HISTORY: Past Surgical History:  Procedure Laterality Date  . BRAVO PH STUDY N/A 06/18/2013   pH STUDY SHOWS NEXIUM TWICE DAILY CONTROLS THE ACID IN HIS  STOMACH. HE HAD VERY FEWEPISODE OF REGURGITATION RECORDED IN THE 2 DAYS THE STUDY WAS PERFORMED.   [name] COLONOSCOPY  2011   [place]  . 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   . ESOPHAGOGASTRODUODENOSCOPY N/A 05/05/2015   Procedure: ESOPHAGOGASTRODUODENOSCOPY (EGD);  Surgeon: Charles Binder, MD;  [facility];  Service: Endoscopy;  Laterality: N/A;  730  . MALONEY DILATION N/A 06/18/2013   Procedure: MALONEY DILATION;  Surgeon: Charles Binder, MD;  [facility];  Service: Endoscopy;  Laterality: N/A;  . SAVORY DILATION N/A 06/18/2013   Procedure: SAVORY DILATION;  Surgeon: Charles Binder, MD;  [facility];  Service: Endoscopy;  Laterality: N/A;  . TEE WITHOUT CARDIOVERSION N/A 10/04/2012   Procedure: TRANSESOPHAGEAL ECHOCARDIOGRAM (TEE);  Surgeon: Charles Headings, MD;  Location: [facility];  Service: Cardiovascular;  Laterality: N/A;  . [place] accident  Both legs fractured and repaired surgically    SOCIAL HISTORY: Social History   Social History  . Marital status: Single    Spouse name: N/A  . Number of children: 0  . Years of education: HS   Occupational History  . UNEMPLOYED    Social History Main Topics  . Smoking status: Former Smoker  Packs/day: 1.00    Years: 29.00    Types: Cigarettes    Quit date: 02/22/1992  . Smokeless tobacco: Never Used  . Alcohol use No     Comment: Socially  . Drug use: No  . Sexual activity: No   Other Topics Concern  . Not on file   Social History Narrative   Patient lives at home with his family. Brother, sister, mother    Patient does not work.    Patient has a high school  education.    Patient has one step child    He is not married, divorced. Single. He has one grandson who is "not really mine, but is like mine." He says he smoked before but not now; he couldn't breathe. He can't remember when he quit smoking. In terms of alcohol, he says he can't with his medicine. Mother says he use to drink. Charles [name] lives with his brother and his mother.  He has been deaf since birth.   FAMILY HISTORY: Family History  Problem Relation Age of Onset  . Diabetes Mother   . Hypertension Mother   . Diabetes Father   . Hypertension Father   . Colon polyps Neg Hx   . Colon cancer Neg Hx    Two sisters, brothers. He was born in [place], in [place]. His father is not living; he died due to bleeding of the brain. Isn't sure how old he was. His mother will be 74 in July. She says she has diabetes; has had open heart surgery. Other than that, good health.  ALLERGIES:  is allergic to contrast media [iodinated diagnostic agents] and shellfish allergy.  MEDICATIONS:  Current Outpatient Prescriptions  Medication Sig Dispense Refill  . aspirin 325 MG EC tablet Take 325 mg by mouth daily.    [name] atorvastatin (LIPITOR) 10 MG tablet Take 10 mg by mouth daily. (cholesterol)    . BENICAR 20 MG tablet take 1 tablet by mouth once daily 30 tablet 0  . clopidogrel (PLAVIX) 75 MG tablet Take 75 mg by mouth daily.    [name] esomeprazole (NEXIUM) 40 MG capsule Take 1 capsule (40 mg total) by mouth 2 (two) times daily. 60 capsule 0  . furosemide (LASIX) 20 MG tablet Take 20 mg by mouth daily.     [name] gabapentin (NEURONTIN) 300 MG capsule Take 1 capsule (300 mg total) by mouth 3 (three) times daily. 90 capsule 11  . iron polysaccharides (NIFEREX) 150 MG capsule Take 1 capsule (150 mg total) by mouth daily. 30 capsule 2  . meclizine (ANTIVERT) 25 MG tablet Take 25 mg by mouth 3 (three) times daily as needed for dizziness.    . meloxicam (MOBIC) 7.5 MG tablet Take 1 tablet (7.5 mg  total) by mouth 2 (two) times daily. 60 tablet 6  . mometasone-formoterol (DULERA) 100-5 MCG/ACT AERO Take 2 puffs first thing in am and then another 2 puffs about 12 hours later. 1 Inhaler 11  . NEXIUM 40 MG capsule take 1 capsule by mouth 30 MINUTES PRIOR TO MEALS----TAKE TWICE A DAY 60 capsule 11  . oxybutynin (DITROPAN XL) 15 MG 24 hr tablet Take 15 mg by mouth at bedtime.    . polyethylene glycol powder (GLYCOLAX/MIRALAX) powder Take 17 g by mouth at bedtime as needed for mild constipation.     . predniSONE (DELTASONE) 1 MG tablet Take 1 mg by mouth daily with breakfast.    . sitaGLIPtin-metformin (JANUMET) 50-500 MG tablet Take 1 tablet by mouth  2 (two) times daily with a meal.    . traMADol (ULTRAM) 50 MG tablet Take 1 tablet (50 mg total) by mouth every 8 (eight) hours as needed. 60 tablet 3   Current Facility-Administered Medications  Medication Dose Route Frequency Provider Last Rate Last Dose  . botulinum toxin Type A (BOTOX) injection 300 Units  300 Units Intramuscular Once Drema Dallas, DO        Review of Systems  Constitutional: Negative.        Ambulates using a cane.  HENT: Negative.   Eyes: Negative.   Respiratory: Negative.   Cardiovascular: Negative.   Gastrointestinal: Positive for abdominal pain.  Genitourinary: Negative.   Musculoskeletal: Positive for joint pain.       Left sided weakness secondary to prior CVA  Skin: Negative.   Neurological: Negative.        Deaf since birth  Endo/Heme/Allergies: Negative.   Psychiatric/Behavioral: Negative.   All other systems reviewed and are negative.  14 point ROS was done and is otherwise as detailed above or in HPI   PHYSICAL EXAMINATION: ECOG PERFORMANCE STATUS: 1 - Symptomatic but completely ambulatory  Vitals:   09/22/15 0845  BP: 121/73  Pulse: (!) 57  Resp: 16  Temp: 97.8 F (36.6 C)   Filed Weights   09/22/15 0845  Weight: 156 lb 1.6 oz (70.8 kg)     Physical Exam  Constitutional: He is  oriented to person, place, and time and well-developed, well-nourished, and in no distress. No distress.  Visit facilitated by a translator. Ambulates with cane.  HENT:  Head: Normocephalic and atraumatic.  Nose: Nose normal.  Mouth/Throat: Oropharynx is clear and moist. No oropharyngeal exudate.  Eyes: Conjunctivae and EOM are normal. Pupils are equal, round, and reactive to light. Right eye exhibits no discharge. Left eye exhibits no discharge. No scleral icterus.  Neck: Normal range of motion. Neck supple. No tracheal deviation present. No thyromegaly present.  Cardiovascular: Normal rate, regular rhythm and normal heart sounds.  Exam reveals no gallop and no friction rub.   No murmur heard. Pulmonary/Chest: Effort normal and breath sounds normal. He has no wheezes. He has no rales.  Abdominal: Soft. Bowel sounds are normal. He exhibits no distension and no mass. There is no tenderness. There is no rebound and no guarding.  Musculoskeletal: Normal range of motion. He exhibits no edema.  LLE brace, uses cane for ambulation. LUE brace  Lymphadenopathy:    He has no cervical adenopathy.  Neurological: He is alert and oriented to person, place, and time. No cranial nerve deficit. Coordination abnormal.  Due to stroke.  Skin: Skin is warm and dry. No rash noted.  Psychiatric: Mood, memory, affect and judgment normal.  Nursing note and vitals reviewed.   LABORATORY DATA:  I have reviewed the data as listed Lab Results  Component Value Date   WBC 3.9 (L) 09/22/2015   HGB 12.4 (L) 09/22/2015   HCT 36.0 (L) 09/22/2015   MCV 88.2 09/22/2015   PLT 120 (L) 09/22/2015   CMP     Component Value Date/Time   NA 136 05/22/2015 1529   K 4.0 05/22/2015 1529   CL 102 05/22/2015 1529   CO2 26 05/22/2015 1529   GLUCOSE 138 (H) 05/22/2015 1529   BUN 14 05/22/2015 1529   CREATININE 0.95 05/22/2015 1529   CALCIUM 8.6 (L) 05/22/2015 1529   PROT 7.2 05/22/2015 1529   ALBUMIN 4.4 05/22/2015 1529     AST 27 05/22/2015 1529  ALT 28 05/22/2015 1529   ALKPHOS 74 05/22/2015 1529   BILITOT 0.4 05/22/2015 1529   GFRNONAA >60 05/22/2015 1529   GFRAA >60 05/22/2015 1529   Results for MITCHELL, IWANICKI (MRN [id number]) as of 09/22/2015 10:05  Ref. Range 04/15/2015 11:44 04/15/2015 11:58 04/17/2015 13:32 05/22/2015 15:29 09/22/2015 08:02  WBC Latest Ref Range: 4.0 - 10.5 K/uL 4.0  3.2 (L) 6.7 3.9 (L)  RBC Latest Ref Range: 4.22 - 5.81 MIL/uL 4.38  3.77 (L) 4.34 4.08 (L)  Hemoglobin Latest Ref Range: 13.0 - 17.0 g/dL 13.6 14.6 11.6 (L) 13.4 12.4 (L)  HCT Latest Ref Range: 39.0 - 52.0 % 38.9 (L) 43.0 33.6 (L) 38.4 (L) 36.0 (L)  MCV Latest Ref Range: 78.0 - 100.0 fL 88.8  89.1 88.5 88.2  MCH Latest Ref Range: 26.0 - 34.0 pg 31.1  30.8 30.9 30.4  MCHC Latest Ref Range: 30.0 - 36.0 g/dL 35.0  34.5 34.9 34.4  RDW Latest Ref Range: 11.5 - 15.5 % 13.1  12.9 12.3 12.8  Platelets Latest Ref Range: 150 - 400 K/uL 138 (L)  122 (L) 155 120 (L)  Neutrophils Latest Units: % 53   75 51  Lymphocytes Latest Units: % 32   17 37  Monocytes Relative Latest Units: % _0 Eosinophil Latest Units: % _1 Basophil Latest Units: % 1   0 0  NEUT# Latest Ref Range: 1.7 - 7.7 K/uL 2.1   5.0 2.0  Lymphocyte # Latest Ref Range: 0.7 - 4.0 K/uL 1.3   1.1 1.4  Monocyte # Latest Ref Range: 0.1 - 1.0 K/uL 0.4   0.5 0.3  Eosinophils Absolute Latest Ref Range: 0.0 - 0.7 K/uL 0.2   0.1 0.1  Basophils Absolute Latest Ref Range: 0.0 - 0.1 K/uL 0.0   0.0 0.0  RBC. Latest Ref Range: 4.22 - 5.81 MIL/uL    4.34   Retic Ct Pct Latest Ref Range: 0.4 - 3.1 %    1.5   Retic Count, Manual Latest Ref Range: 19.0 - 186.0 K/uL    65.1              PATHOLOGY:       RADIOGRAPHIC STUDIES: I have personally reviewed the radiological images as listed and agreed with the findings in the report.  Study Result     [facility] [address] [place], [place] [address] [phone]  NEUROIMAGING  REPORT    STUDY DATE: 08/21/2015 PATIENT NAME: WELBORN KEENA DOB: 1955/03/02 MRN: [id number]  EXAM: MRI of the lumbar spine without contrast  ORDERING CLINICIAN: Marcial Pacas M.D. PhD CLINICAL HISTORY: 60 year old man with a gait disturbance and sciatica COMPARISON FILMS: 12/24/2012  TECHNIQUE: MRI of the lumbar spine was obtained utilizing 4 mm sagittal slices from T70-01 down to the lower sacrum with T1, T2 and inversion recovery views. In addition 4 mm axial slices from V4-9 down to L5-S1 level were included with T1 and T2 weighted views. CONTRAST: None IMAGING SITE: [facility], [address] [place], [place], [place]  FINDINGS: On sagittal images, the spine is imaged from T11 to the sacrum.  There is a 36 mm renal cyst on the right, unchanged when compared to the 12/24/2012 MRI. The conus medullaris and cauda equine appear normal.   There is 1-2 mm of anterolisthesis of L4 upon L5 of a degenerative nature.   The vertebral bodies have normal signal.  The discs and interspaces were further  evaluated on axial views from L1 to S1 as follows: L1 - L2:  The disc and interspace appear normal. L2 - L3:  The disc and interspace appear normal. L3 - L4:  The disc appears normal. There is mild facet hypertrophy.. L4 - L5:  There is broad disc bulging and moderately severe facet hypertrophy, right greater than left. The foramina are mildly to oderately narrowed and lateral recesses are mildly narrowed, but there is no nerve root compression.  L5 - S1:  There is minimal central disc bulging and mild facet hypertrophy. The neural foramina and lateral recess are not narrowed and there is no nerve root impingement..  The study was compared to the MRI of the lumbar spine dated 12/24/2012.  In the interim, the synovial cyst that was seen to the left at L4-L5 is no longer present, relieving the left L5 nerve root impingement. However, the facet hypertrophy and minimal anterolisthesis are slightly  worse since the prior MRI.   IMPRESSION:  This is an abnormal MRI of the lumbar spine showing the following: 1.    At L4-L5, there is broad disc bulging, moderately severe facet hypertrophy, right greater than left, and minimal anterolisthesis. There is no nerve root compression at this level. When compared to the MRI dated 12/24/2012, the synovial cyst that was noted to the left on the prior MRI is no longer present. This relieves the left L5 nerve root impingement that was noted at that time. However, since the prior MRI there has been mild progression of the facet hypertrophy and minimal anterolisthesis . 2.    There are milder degenerative changes at L3-L4 and L5-S1 that did not lead to any nerve root impingement, unchanged when compared to the previous MRI.     INTERPRETING PHYSICIAN:  Richard A. Felecia Shelling, MD, PhD Certified in  [place] by [place] [place] [address] [place], [place] [address] [phone]  NEUROIMAGING REPORT    STUDY DATE: 08/21/2015 PATIENT NAME: KALAI BACA DOB: 1955/08/19 MRN: [id number]  EXAM: MRI of the cervical spine  ORDERING CLINICIAN: Marcial Pacas M.D. PhD CLINICAL HISTORY: 60 year old man with neck pain, gait disturbance and spastic hemiplegia COMPARISON FILMS: None  TECHNIQUE: MRI of the cervical spine was obtained utilizing 3 mm sagittal slices from the posterior fossa down to the T3-4 level with T1, T2 and inversion recovery views. In addition 4 mm axial slices from L3-7 down to T1-2 level were included with T2 and gradient echo views. CONTRAST: none IMAGING SITE: [facility], [place], [place], [place]  FINDINGS: :  On sagittal images, the spine is imaged from above the cervicomedullary junction to T2.   There is reversal of the cervical spine curvature. Severe loss of disc height is noted at C3-C4 and mild loss of disc height is noted  at C5-C6 and C6-C7. No spinal stenosis as detailed above. There is a subtle T2 hyperintense focus on sagittal STIR images not definitely seen on the axial images adjacent to C4.  The vertebral bodies have normal signal.    The discs and interspaces were further evaluated on axial views from C2 to T1 as follows:  C2-C3: There is mild spinal stenosis due to disc protrusion and left greater than right facet hypertrophy. There is moderate left foraminal narrowing. There is no definite nerve root compression though there is some encroachment upon the exiting left C3 nerve root.  C3-C4: There is severe  spinal stenosis due to a central disc herniation, bilateral uncovertebral spurring , left greater than right facet hypertrophy and congenitally short pedicles. The thecal sac is distorted. There is subtle signal within the spinal cord on sagittal STIR images not clearly seen on other images.  Additionally, there is moderately severe bilateral foraminal narrowing at could lead to either of the C4 nerve roots  C4-C5: Severe spinal stenosis due to a combination of midline disc herniation, mild uncovertebral spurring and congenitally short pedicles.   There is mild foraminal narrowing but there does not appear to be nerve root impingement.  C5-C6: There is moderate spinal stenosis due to a combination of disc protrusion, mild uncovertebral spurring and congenitally short pedicles.. There is mild to moderate left foraminal narrowing but there does not appear to be any nerve root impingement.  C6-C7: There is moderate spinal stenosis due to poor disc protrusion and congenitally short pedicles. The left neural foramen is mildly to moderately narrowed but there does not appear to be nerve root compression.  C7-T1:  There is borderline spinal stenosis due to mild disc bulging and congenitally short pedicles. The neural foramina are not significantly narrowed and there is no nerve root  impingement.    IMPRESSION:  This MRI of the cervical spine shows the following 1.    There is severe spinal stenosis at C3-C4 and at C4-C5 due to central disc protrusions/herniations, uncovertebral spurring, facet hypertrophy and congenitally short pedicles. There is subtle hyperintense signal within the spinal cord on sagittal STIR images just below the C3-C4 interspace but this is not confirmed on axial images. This could represent a mild cervical myelopathy. 2.    There is moderate spinal stenosis at C5-C6 and C6-C7 mild spinal stenosis at C2-C3 to degenerative changes and congenitally short pedicles. 3.   At C3-C4 there is moderately severe bilateral foraminal narrowing at could lead to impingement either of the C4 nerve roots.   There does not appear to be nerve root impingement at the other cervical levels.     INTERPRETING PHYSICIAN:  Richard A. Felecia Shelling, MD, PhD Certified in  Neuroimaging by [facility]   No results found. ASSESSMENT & PLAN:  Pancytopenia, new onset Sarcoidosis Granulomatous gastritis Prior CVA with L sided weakness  Labs are reviewed. Mild pancytopenia. Prior CBC WNL. I suspect it may be autoimmune. Counts are certainly adequate. Would continue with observation for now. Have recommended repeat CBC in 4 week.  We will call with these results. Additional recommendations will be made pending the results of his repeat blood work.   He will return for follow up in 3 months.   Orders Placed This Encounter  Procedures  . CBC with Differential    Standing Status:   Future    Standing Expiration Date:   09/21/2016  . CBC with Differential    Standing Status:   Future    Standing Expiration Date:   09/21/2016    All questions were answered. The patient knows to call the clinic with any problems, questions or concerns.  This document serves as a record of services personally performed by Ancil Linsey, MD. It was created on her behalf by  Arlyce Harman, a trained medical scribe. The creation of this record is based on the scribe's personal observations and the provider's statements to them. This document has been checked and approved by the attending provider.  I have reviewed the above documentation for accuracy and completeness and I agree with the above.  This  note was electronically signed.    Molli Hazard, MD  09/22/2015 8:49 AM

## 2015-09-22 NOTE — Patient Instructions (Signed)
[place] at [facility] Discharge Instructions  RECOMMENDATIONS MADE BY THE CONSULTANT AND ANY TEST RESULTS WILL BE SENT TO YOUR REFERRING PHYSICIAN.  Exam done and seen today by Dr. [name] in one month and in 3 months Return to see the doctor in 3 months Please call the clinic if you have any questions or concerns  Thank you for choosing [place] at [facility] to provide your oncology and hematology care.  To afford each patient quality time with our provider, please arrive at least 15 minutes before your scheduled appointment time.   Beginning January 23rd 2017 lab work for the [name] will be done in the  Main lab at [facility] on 1st floor. If you have a lab appointment with the [facility] please come in thru the  Main Entrance and check in at the main information desk  You need to re-schedule your appointment should you arrive 10 or more minutes late.  We strive to give you quality time with our providers, and arriving late affects you and other patients whose appointments are after yours.  Also, if you no show three or more times for appointments you may be dismissed from the clinic at the providers discretion.     Again, thank you for choosing [facility].  Our hope is that these requests will decrease the amount of time that you wait before being seen by our physicians.       _____________________________________________________________  Should you have questions after your visit to [facility], please contact our office at [phone] between the hours of 8:30 a.m. and 4:30 p.m.  Voicemails left after 4:30 p.m. will not be returned until the following business day.  For prescription refill requests, have your pharmacy contact our office.         Resources For Cancer Patients and their Caregivers ? American Cancer Society: Can assist with transportation, wigs, general needs, runs Look  Good Feel Better.        ([phone] 085-2668 ? Cancer Care: Provides financial assistance, online support groups, medication/co-pay assistance.  1-800-813-HOPE (639)514-1464) ? [facility] Assists [place] cancer patients and their families through emotional , educational and financial support.  902-588-8730 ? [place] DSS Where to apply for food stamps, Medicaid and utility assistance. 770-450-5178 ? [facility]: Transportation to medical appointments. 8707785829 ? Social Security Administration: May apply for disability if have a Stage IV cancer. (581) 044-2090 8033732045 ? [employer], Disability and Transit Services: Assists with nutrition, care and transit needs. [place] Support Programs: @10RELATIVEDAYS @ > Cancer Support Group  2nd Tuesday of the month 1pm-2pm, Journey Room  > Creative Journey  3rd Tuesday of the month 1130am-1pm, Journey Room  > Look Good Feel Better  1st Wednesday of the month 10am-12 noon, Journey Room (Call [facility] to register [phone])

## 2015-09-22 NOTE — Progress Notes (Signed)
[name]- Interpreter from communication services  present for discharge instructions today. Patient verified he has no questions.

## 2015-09-25 DIAGNOSIS — E7800 Pure hypercholesterolemia, unspecified: Secondary | ICD-10-CM | POA: Diagnosis not present

## 2015-09-25 DIAGNOSIS — I10 Essential (primary) hypertension: Secondary | ICD-10-CM | POA: Diagnosis not present

## 2015-09-25 DIAGNOSIS — E1165 Type 2 diabetes mellitus with hyperglycemia: Secondary | ICD-10-CM | POA: Diagnosis not present

## 2015-09-25 DIAGNOSIS — I739 Peripheral vascular disease, unspecified: Secondary | ICD-10-CM | POA: Diagnosis not present

## 2015-09-26 DIAGNOSIS — Z91041 Radiographic dye allergy status: Secondary | ICD-10-CM | POA: Diagnosis not present

## 2015-09-26 DIAGNOSIS — E7800 Pure hypercholesterolemia, unspecified: Secondary | ICD-10-CM | POA: Diagnosis not present

## 2015-09-26 DIAGNOSIS — Z7984 Long term (current) use of oral hypoglycemic drugs: Secondary | ICD-10-CM | POA: Diagnosis not present

## 2015-09-26 DIAGNOSIS — Z8673 Personal history of transient ischemic attack (TIA), and cerebral infarction without residual deficits: Secondary | ICD-10-CM | POA: Diagnosis not present

## 2015-09-26 DIAGNOSIS — Z79899 Other long term (current) drug therapy: Secondary | ICD-10-CM | POA: Diagnosis not present

## 2015-09-26 DIAGNOSIS — R030 Elevated blood-pressure reading, without diagnosis of hypertension: Secondary | ICD-10-CM | POA: Diagnosis not present

## 2015-09-26 DIAGNOSIS — G4489 Other headache syndrome: Secondary | ICD-10-CM | POA: Diagnosis not present

## 2015-09-26 DIAGNOSIS — I10 Essential (primary) hypertension: Secondary | ICD-10-CM | POA: Diagnosis not present

## 2015-09-26 DIAGNOSIS — Z7952 Long term (current) use of systemic steroids: Secondary | ICD-10-CM | POA: Diagnosis not present

## 2015-09-26 DIAGNOSIS — R51 Headache: Secondary | ICD-10-CM | POA: Diagnosis not present

## 2015-09-26 DIAGNOSIS — E119 Type 2 diabetes mellitus without complications: Secondary | ICD-10-CM | POA: Diagnosis not present

## 2015-09-26 DIAGNOSIS — H9193 Unspecified hearing loss, bilateral: Secondary | ICD-10-CM | POA: Diagnosis not present

## 2015-09-26 DIAGNOSIS — Z7902 Long term (current) use of antithrombotics/antiplatelets: Secondary | ICD-10-CM | POA: Diagnosis not present

## 2015-09-26 DIAGNOSIS — Z91013 Allergy to seafood: Secondary | ICD-10-CM | POA: Diagnosis not present

## 2015-09-28 ENCOUNTER — Telehealth: Payer: Self-pay | Admitting: Neurology

## 2015-09-28 NOTE — Telephone Encounter (Signed)
Dr Jaynee Eagles- this is a Dr Krista Blue patient. Please advise.

## 2015-09-28 NOTE — Telephone Encounter (Signed)
Mother Sherrin Daisy called to advise patient having severe headache pains, went to [facility] ER by EMS on Saturday, [facility], patient told her yesterday evening "something crawiling in head, eye hurting". Please call to advise.

## 2015-09-28 NOTE — Telephone Encounter (Signed)
He had a severe headache. He has had similar headaches in the past but this was worse. On Saturday it was severe, had to take him to the emergency to [facility], they treated him and he got better. CT was negative per mother. The headache is gone, he has some tingling(chronic) in the head but no new or repeat headache since Saturday. I would encourage him to still go to pre-op Wednesday. He has tingling in the head which is not new.Headache gone and he is back to his baseline. He has pre-op but surgery is not until next week. If he has another headache she should give [place] a call and we will see him in the office.

## 2015-09-30 ENCOUNTER — Encounter (HOSPITAL_COMMUNITY)
Admission: RE | Admit: 2015-09-30 | Discharge: 2015-09-30 | Disposition: A | Discharge status: Home / Self Care | Payer: Medicare Other | Source: Ambulatory Visit | Attending: Neurosurgery | Admitting: Neurosurgery

## 2015-09-30 ENCOUNTER — Encounter (HOSPITAL_COMMUNITY): Payer: Self-pay

## 2015-09-30 DIAGNOSIS — Z7952 Long term (current) use of systemic steroids: Secondary | ICD-10-CM | POA: Insufficient documentation

## 2015-09-30 DIAGNOSIS — J449 Chronic obstructive pulmonary disease, unspecified: Secondary | ICD-10-CM | POA: Diagnosis not present

## 2015-09-30 DIAGNOSIS — E119 Type 2 diabetes mellitus without complications: Secondary | ICD-10-CM | POA: Diagnosis not present

## 2015-09-30 DIAGNOSIS — D869 Sarcoidosis, unspecified: Secondary | ICD-10-CM | POA: Diagnosis not present

## 2015-09-30 DIAGNOSIS — Z01818 Encounter for other preprocedural examination: Secondary | ICD-10-CM | POA: Diagnosis not present

## 2015-09-30 DIAGNOSIS — Z7982 Long term (current) use of aspirin: Secondary | ICD-10-CM | POA: Diagnosis not present

## 2015-09-30 DIAGNOSIS — I739 Peripheral vascular disease, unspecified: Secondary | ICD-10-CM | POA: Insufficient documentation

## 2015-09-30 DIAGNOSIS — I10 Essential (primary) hypertension: Secondary | ICD-10-CM | POA: Diagnosis not present

## 2015-09-30 DIAGNOSIS — Z87891 Personal history of nicotine dependence: Secondary | ICD-10-CM | POA: Insufficient documentation

## 2015-09-30 DIAGNOSIS — Z7984 Long term (current) use of oral hypoglycemic drugs: Secondary | ICD-10-CM | POA: Insufficient documentation

## 2015-09-30 DIAGNOSIS — G959 Disease of spinal cord, unspecified: Secondary | ICD-10-CM | POA: Insufficient documentation

## 2015-09-30 DIAGNOSIS — Z8673 Personal history of transient ischemic attack (TIA), and cerebral infarction without residual deficits: Secondary | ICD-10-CM | POA: Diagnosis not present

## 2015-09-30 DIAGNOSIS — Z7902 Long term (current) use of antithrombotics/antiplatelets: Secondary | ICD-10-CM | POA: Insufficient documentation

## 2015-09-30 DIAGNOSIS — Z7951 Long term (current) use of inhaled steroids: Secondary | ICD-10-CM | POA: Diagnosis not present

## 2015-09-30 DIAGNOSIS — E785 Hyperlipidemia, unspecified: Secondary | ICD-10-CM | POA: Diagnosis not present

## 2015-09-30 DIAGNOSIS — K219 Gastro-esophageal reflux disease without esophagitis: Secondary | ICD-10-CM | POA: Diagnosis not present

## 2015-09-30 DIAGNOSIS — H9190 Unspecified hearing loss, unspecified ear: Secondary | ICD-10-CM | POA: Insufficient documentation

## 2015-09-30 DIAGNOSIS — Z79899 Other long term (current) drug therapy: Secondary | ICD-10-CM | POA: Diagnosis not present

## 2015-09-30 DIAGNOSIS — Z01812 Encounter for preprocedural laboratory examination: Secondary | ICD-10-CM | POA: Insufficient documentation

## 2015-09-30 DIAGNOSIS — Z0183 Encounter for blood typing: Secondary | ICD-10-CM | POA: Insufficient documentation

## 2015-09-30 HISTORY — DX: Anemia, unspecified: D64.9

## 2015-09-30 LAB — GLUCOSE, CAPILLARY: GLUCOSE-CAPILLARY: 88 mg/dL (ref 65–99)

## 2015-09-30 LAB — TYPE AND SCREEN
ABO/RH(D): O POS
Antibody Screen: NEGATIVE

## 2015-09-30 LAB — SURGICAL PCR SCREEN
MRSA, PCR: NEGATIVE
STAPHYLOCOCCUS AUREUS: POSITIVE — AB

## 2015-09-30 LAB — ABO/RH: ABO/RH(D): O POS

## 2015-09-30 NOTE — Pre-Procedure Instructions (Signed)
SABINO EVELYN  09/30/2015      [facility] [place], [employer] [address] [place] [address] Phone: [phone] Fax: [phone]    Your procedure is scheduled on Wednesday August 16.  Report to [facility] Admitting at 6:30 A.M.  Call this number if you have problems the morning of surgery:  [phone]   Remember:  Do not eat food or drink liquids after midnight.  Take these medicines the morning of surgery with A SIP OF WATER: acetaminophen (tylenol) if needed, symbicort if needed (bring to hospital), esomeprazole (Nexium), gabapentin (neurontin), Dulera, prednisone (deltasone), tramadol (ultram) if needed  7 days prior to surgery STOP taking any Aspirin, Aleve, Naproxen, Ibuprofen, Motrin, Advil, Goody's, BC's, all herbal medications, fish oil, and all vitamins  WHAT DO I DO ABOUT MY DIABETES MEDICATION?   [name] Do not take oral diabetes medicines (pills) the morning of surgery. DO NOT take Janumet the day of surgery.      How to Manage Your Diabetes Before and After Surgery  Why is it important to control my blood sugar before and after surgery? . Improving blood sugar levels before and after surgery helps healing and can limit problems. . A way of improving blood sugar control is eating a healthy diet by: o  Eating less sugar and carbohydrates o  Increasing activity/exercise o  Talking with your doctor about reaching your blood sugar goals . High blood sugars (greater than 180 mg/dL) can raise your risk of infections and slow your recovery, so you will need to focus on controlling your diabetes during the weeks before surgery. . Make sure that the doctor who takes care of your diabetes knows about your planned surgery including the date and location.  How do I manage my blood sugar before surgery? . Check your blood sugar at least 4 times a day, starting 2 days before surgery, to make sure that the level  is not too high or low. o Check your blood sugar the morning of your surgery when you wake up and every 2 hours until you get to the Short Stay unit. . If your blood sugar is less than 70 mg/dL, you will need to treat for low blood sugar: o Do not take insulin. o Treat a low blood sugar (less than 70 mg/dL) with  cup of clear juice (cranberry or apple), 4 glucose tablets, OR glucose gel. o Recheck blood sugar in 15 minutes after treatment (to make sure it is greater than 70 mg/dL). If your blood sugar is not greater than 70 mg/dL on recheck, call [phone] for further instructions. . Report your blood sugar to the short stay nurse when you get to Short Stay.  . If you are admitted to the hospital after surgery: o Your blood sugar will be checked by the staff and you will probably be given insulin after surgery (instead of oral diabetes medicines) to make sure you have good blood sugar levels. o The goal for blood sugar control after surgery is 80-180 mg/dL.                Do not wear jewelry  Do not wear lotions, powders, or perfumes.  You may NOT wear deoderant.  Men may shave face   Do not bring valuables to the hospital.  [facility] is not responsible for any belongings or valuables.  Contacts, dentures or bridgework may not be worn  into surgery.  Leave your suitcase in the car.  After surgery it may be brought to your room.  For patients admitted to the hospital, discharge time will be determined by your treatment team.  Patients discharged the day of surgery will not be allowed to drive home.    Special instructions:     [facility]- Preparing For Surgery  Before surgery, you can play an important role. Because skin is not sterile, your skin needs to be as free of germs as possible. You can reduce the number of germs on your skin by washing with CHG (chlorahexidine gluconate) Soap before surgery.  CHG is an antiseptic cleaner which kills germs and bonds with the  skin to continue killing germs even after washing.  Please do not use if you have an allergy to CHG or antibacterial soaps. If your skin becomes reddened/irritated stop using the CHG.  Do not shave (including legs and underarms) for at least 48 hours prior to first CHG shower. It is OK to shave your face.  Please follow these instructions carefully.   1. Shower the NIGHT BEFORE SURGERY and the MORNING OF SURGERY with CHG.   2. If you chose to wash your hair, wash your hair first as usual with your normal shampoo.  3. After you shampoo, rinse your hair and body thoroughly to remove the shampoo.  4. Use CHG as you would any other liquid soap. You can apply CHG directly to the skin and wash gently with a scrungie or a clean washcloth.   5. Apply the CHG Soap to your body ONLY FROM THE NECK DOWN.  Do not use on open wounds or open sores. Avoid contact with your eyes, ears, mouth and genitals (private parts). Wash genitals (private parts) with your normal soap.  6. Wash thoroughly, paying special attention to the area where your surgery will be performed.  7. Thoroughly rinse your body with warm water from the neck down.  8. DO NOT shower/wash with your normal soap after using and rinsing off the CHG Soap.  9. Pat yourself dry with a CLEAN TOWEL.   10. Wear CLEAN PAJAMAS   11. Place CLEAN SHEETS on your bed the night of your first shower and DO NOT SLEEP WITH PETS.    Day of Surgery: Do not apply any deodorants/lotions. Please wear clean clothes to the hospital/surgery center.      Please read over the following fact sheets that you were given. MRSA Information

## 2015-09-30 NOTE — Progress Notes (Signed)
PCP: Dr. Mammie Lorenzo Dr. Chalmers Cater manages pt Diabetes Cardiologist: Dr. Domenic Polite with [facility] in [place], pt denies stress test or cath Pt sees Dr. Cyndia Skeeters at [facility] for "low blood counts" per his mom.   Pt recently in ED at [facility] d/t severe headache, records requested from [facility], discharge information placed in chart including CBC and BMET. Ebony Hail NP notified of patient.   Pt with no complaints of chest pain, headache, shortness of breath at this time.   Per pt mother, Pt instructed to stop Plavix and ASA 5-7 days prior to surgery by surgeon.   Pt has Medical Clearance letter in chart, Pulmnary Clearance From Dr. Elijah Birk in [place]

## 2015-10-01 LAB — HEMOGLOBIN A1C
HEMOGLOBIN A1C: 5.1 % (ref 4.8–5.6)
MEAN PLASMA GLUCOSE: 100 mg/dL

## 2015-10-01 NOTE — Progress Notes (Signed)
Anesthesia Chart Review: Patient is a 60 year old male scheduled for posterior cervical laminectomy, multiple level and fusion, C3-4, C4-5, C5-6, C6-7 on 10/07/2015 by Dr. Saintclair Halsted. Patient goes by [name].  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   Patient was evaluated by [facility]) on 09/26/15 for severe headache 10/10. Head CT showed no acute intracranial abnormalities, chronic microvascular ischemic changes in the cerebral white matter and encephalomalacia from remote right MCA distribution infarction redemonstrated. He was treated with dexamethasone, ketorolac, metoclopramide, and morphine and discharged home. Patient did notify neurologist Dr. Jaynee Eagles (primary neurologist is Dr. Krista Blue) who wrote on 09/28/15, "He had a severe headache. He has had similar headaches in the past but this was worse. On Saturday it was severe, had to take him to the emergency to [facility], they treated him and he got better. CT was negative per mother. The headache is gone, he has some tingling(chronic) in the head but no new or repeat headache since Saturday. I would encourage him to still go to pre-op Wednesday. He has tingling in the head which is not new.Headache gone and he is back to his baseline. He has pre-op but surgery is not until next week. If he has another headache she should give [place] a call and we will see him in the office." Patient is aware to notify his neurologist is recurrent severe headache prior to surgery, so he can be advised.  PCP is Dr. Manuella Ghazi, who signed a note of medical clearance. Pulmonologist is Dr. Melvyn Novas, last visit 08/10/15 for COPD and Sarcoidosis (diagnosed in [place]) follow-up. 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 Patient being treated on 5-10 mg of prednisone since  at least 2014. He felt "Very strongly doubt active sarcoid as no worse on 1 mg vs 10 but has been on the steroids so long he risks adrenal crisis if stops > no change rx at this point as long as continues to do so well." Patient now down to 1 mg prednisone daily.   Meds include aspirin 325 mg, Lipitor, Benicar, Symbicort, Plavix, Nexium, Breo Ellipta, Lasix, Neurontin, Niferex, meclizine, Dulera, tramadol, Janumet, prednisone 1mg , Ditropan XL. Per his mother, patient was instructed to hold aspirin Plavix 5-7 days prior to surgery.  BP (!) 154/72 Comment: rechecked/notified RN  Pulse 63   Temp 36.7 C   Resp 18   Ht 5\' 4"  (1.626 m)   Wt 154 lb 9.6 oz (70.1 kg)   SpO2 100%   BMI 26.54 kg/m   09/26/15 EKG [facility]): SR, probable LVH, early repolarization, normal variant.  10/04/12 TEE: Study Conclusions - Left ventricle: Systolic function was normal. The estimated ejection fraction was in the range of 60% to 65%. - Aortic valve: No evidence of vegetation. Trivial regurgitation. - Mitral valve: No evidence of vegetation. - Left atrium: No evidence of thrombus in the atrial cavity or appendage. The appendage was moderately dilated. - Atrial septum: No defect or patent foramen ovale was identified. - Tricuspid valve: No evidence of vegetation.  09/25/12 TTE [facility]): Conclusions: 1. Global left ventricular wall motion and contractility are within normal limits. Estimated EF is 60-65%. 2. Mild concentric LVH. 3. The left ventricular diastolic filling pattern is consistent with impaired relaxation, grade 1 diastolic dysfunction. 4. Mild lipomatous hypertrophy of the interatrial septum. 5. The mitral valve leaflets are mildly thickened. There is  mild mitral regurgitation. 6. There is mild tricuspid regurgitation.  10/02/12 Carotid U/S:Summary: - The vertebral arteries appear patent with antegrade flow. - Findings consistent with less than 39 percent stenosis involving the right  internal carotid artery and the left internal carotid artery.  04/17/15 CXR: IMPRESSION: Evidence of prior granulomatous disease. Areas of scarring bilaterally, stable. No edema or consolidation.  4/222/16 PFTs: FVC 2.22 (70%), FEV11.13 (46%), DLCOunc 15.93 (65%).  Preoperative labs noted. A1c 5.1. T&S done. He also had labs from 09/26/15 from [facility] that showed WBC 4.7, H/H 14.3/41.3, PLT 128K. INR 0.9, PT 9.7. PTT 25.7. Glucose 73. Cr 0.88, BUN 12, K 4.2, ALT 43, AST 34.6., troponin < 0.01.   If no acute changes then I would anticipate that he could proceed as planned. He is on chronic prednisone (1mg  daily). A interpretor for the deaf was requested for day of surgery.   Ronell Hugh [facility] Short Stay Center/Anesthesiology Phone [phone] 10/01/2015 5:59 PM

## 2015-10-06 ENCOUNTER — Encounter (HOSPITAL_COMMUNITY): Payer: Self-pay | Admitting: Anesthesiology

## 2015-10-06 MED ORDER — DEXAMETHASONE SODIUM PHOSPHATE 10 MG/ML IJ SOLN
10.0000 mg | INTRAMUSCULAR | Status: AC
  Administered 2015-10-07: 10 mg via INTRAVENOUS
  Filled 2015-10-06: qty 1

## 2015-10-06 MED ORDER — CEFAZOLIN SODIUM-DEXTROSE 2-4 GM/100ML-% IV SOLN
2.0000 g | INTRAVENOUS | Status: AC
  Administered 2015-10-07: 2 g via INTRAVENOUS
  Filled 2015-10-06: qty 100

## 2015-10-06 NOTE — Anesthesia Preprocedure Evaluation (Addendum)
Anesthesia Evaluation  Patient identified by MRN, date of birth, ID band Patient awake    Reviewed: Allergy & Precautions, NPO status , Patient's Chart, lab work & pertinent test results  Airway Mallampati: I       Dental no notable dental hx.    Pulmonary former smoker,    Pulmonary exam normal        Cardiovascular Normal cardiovascular exam     Neuro/Psych negative psych ROS   GI/Hepatic Neg liver ROS, GERD  Medicated and Controlled,  Endo/Other  negative endocrine ROSdiabetes, Type 2  Renal/GU negative Renal ROS  negative genitourinary   Musculoskeletal negative musculoskeletal ROS (+)   Abdominal Normal abdominal exam  (+)   Peds negative pediatric ROS (+)  Hematology   Anesthesia Other Findings   Reproductive/Obstetrics negative OB ROS                            Anesthesia Physical Anesthesia Plan  ASA: II  Anesthesia Plan: General   Post-op Pain Management:    Induction: Intravenous  Airway Management Planned: Oral ETT  Additional Equipment:   Intra-op Plan:   Post-operative Plan: Possible Post-op intubation/ventilation and Extubation in OR  Informed Consent: I have reviewed the patients History and Physical, chart, labs and discussed the procedure including the risks, benefits and alternatives for the proposed anesthesia with the patient or authorized representative who has indicated his/her understanding and acceptance.   Dental advisory given  Plan Discussed with:   Anesthesia Plan Comments:        Anesthesia Quick Evaluation

## 2015-10-07 ENCOUNTER — Encounter (HOSPITAL_COMMUNITY): Payer: Self-pay | Admitting: Surgery

## 2015-10-07 ENCOUNTER — Telehealth: Payer: Self-pay | Admitting: Gastroenterology

## 2015-10-07 ENCOUNTER — Ambulatory Visit: Payer: Medicare Other | Admitting: Gastroenterology

## 2015-10-07 ENCOUNTER — Inpatient Hospital Stay (HOSPITAL_COMMUNITY)
Admission: RE | Admit: 2015-10-07 | Discharge: 2015-10-13 | DRG: 472 | Disposition: A | Discharge status: Home Health | Payer: Medicare Other | Source: Ambulatory Visit | Attending: Neurosurgery | Admitting: Neurosurgery

## 2015-10-07 ENCOUNTER — Encounter (HOSPITAL_COMMUNITY)
Admission: RE | Disposition: A | Discharge status: Home Health | Payer: Self-pay | Source: Ambulatory Visit | Attending: Neurosurgery

## 2015-10-07 ENCOUNTER — Inpatient Hospital Stay (HOSPITAL_COMMUNITY): Payer: Medicare Other | Admitting: Anesthesiology

## 2015-10-07 ENCOUNTER — Inpatient Hospital Stay (HOSPITAL_COMMUNITY): Payer: Medicare Other

## 2015-10-07 ENCOUNTER — Inpatient Hospital Stay (HOSPITAL_COMMUNITY): Payer: Medicare Other | Admitting: Vascular Surgery

## 2015-10-07 DIAGNOSIS — Z91041 Radiographic dye allergy status: Secondary | ICD-10-CM | POA: Diagnosis not present

## 2015-10-07 DIAGNOSIS — I69354 Hemiplegia and hemiparesis following cerebral infarction affecting left non-dominant side: Secondary | ICD-10-CM | POA: Diagnosis not present

## 2015-10-07 DIAGNOSIS — Z419 Encounter for procedure for purposes other than remedying health state, unspecified: Secondary | ICD-10-CM

## 2015-10-07 DIAGNOSIS — D869 Sarcoidosis, unspecified: Secondary | ICD-10-CM

## 2015-10-07 DIAGNOSIS — M4802 Spinal stenosis, cervical region: Secondary | ICD-10-CM

## 2015-10-07 DIAGNOSIS — K219 Gastro-esophageal reflux disease without esophagitis: Secondary | ICD-10-CM | POA: Diagnosis not present

## 2015-10-07 DIAGNOSIS — M542 Cervicalgia: Secondary | ICD-10-CM

## 2015-10-07 DIAGNOSIS — I739 Peripheral vascular disease, unspecified: Secondary | ICD-10-CM | POA: Diagnosis not present

## 2015-10-07 DIAGNOSIS — G8114 Spastic hemiplegia affecting left nondominant side: Secondary | ICD-10-CM

## 2015-10-07 DIAGNOSIS — M79601 Pain in right arm: Secondary | ICD-10-CM

## 2015-10-07 DIAGNOSIS — M4712 Other spondylosis with myelopathy, cervical region: Secondary | ICD-10-CM | POA: Diagnosis not present

## 2015-10-07 DIAGNOSIS — Z7952 Long term (current) use of systemic steroids: Secondary | ICD-10-CM | POA: Diagnosis not present

## 2015-10-07 DIAGNOSIS — Z7982 Long term (current) use of aspirin: Secondary | ICD-10-CM | POA: Diagnosis not present

## 2015-10-07 DIAGNOSIS — Z7984 Long term (current) use of oral hypoglycemic drugs: Secondary | ICD-10-CM | POA: Diagnosis not present

## 2015-10-07 DIAGNOSIS — Z791 Long term (current) use of non-steroidal anti-inflammatories (NSAID): Secondary | ICD-10-CM | POA: Diagnosis not present

## 2015-10-07 DIAGNOSIS — Z981 Arthrodesis status: Secondary | ICD-10-CM | POA: Diagnosis not present

## 2015-10-07 DIAGNOSIS — Z87891 Personal history of nicotine dependence: Secondary | ICD-10-CM

## 2015-10-07 DIAGNOSIS — H913 Deaf nonspeaking, not elsewhere classified: Secondary | ICD-10-CM

## 2015-10-07 DIAGNOSIS — Z79899 Other long term (current) drug therapy: Secondary | ICD-10-CM

## 2015-10-07 DIAGNOSIS — Z7902 Long term (current) use of antithrombotics/antiplatelets: Secondary | ICD-10-CM

## 2015-10-07 DIAGNOSIS — G5691 Unspecified mononeuropathy of right upper limb: Secondary | ICD-10-CM

## 2015-10-07 DIAGNOSIS — E782 Mixed hyperlipidemia: Secondary | ICD-10-CM | POA: Diagnosis not present

## 2015-10-07 DIAGNOSIS — Z7951 Long term (current) use of inhaled steroids: Secondary | ICD-10-CM | POA: Diagnosis not present

## 2015-10-07 DIAGNOSIS — I63311 Cerebral infarction due to thrombosis of right middle cerebral artery: Secondary | ICD-10-CM

## 2015-10-07 DIAGNOSIS — I10 Essential (primary) hypertension: Secondary | ICD-10-CM

## 2015-10-07 DIAGNOSIS — Z91013 Allergy to seafood: Secondary | ICD-10-CM | POA: Diagnosis not present

## 2015-10-07 DIAGNOSIS — E1151 Type 2 diabetes mellitus with diabetic peripheral angiopathy without gangrene: Secondary | ICD-10-CM | POA: Diagnosis not present

## 2015-10-07 DIAGNOSIS — J449 Chronic obstructive pulmonary disease, unspecified: Secondary | ICD-10-CM

## 2015-10-07 DIAGNOSIS — R269 Unspecified abnormalities of gait and mobility: Secondary | ICD-10-CM

## 2015-10-07 DIAGNOSIS — D649 Anemia, unspecified: Secondary | ICD-10-CM | POA: Diagnosis not present

## 2015-10-07 DIAGNOSIS — M5441 Lumbago with sciatica, right side: Secondary | ICD-10-CM

## 2015-10-07 DIAGNOSIS — Z885 Allergy status to narcotic agent status: Secondary | ICD-10-CM | POA: Diagnosis not present

## 2015-10-07 DIAGNOSIS — M5000 Cervical disc disorder with myelopathy, unspecified cervical region: Secondary | ICD-10-CM | POA: Diagnosis not present

## 2015-10-07 HISTORY — PX: POSTERIOR CERVICAL FUSION/FORAMINOTOMY: SHX5038

## 2015-10-07 LAB — GLUCOSE, CAPILLARY
GLUCOSE-CAPILLARY: 121 mg/dL — AB (ref 65–99)
GLUCOSE-CAPILLARY: 137 mg/dL — AB (ref 65–99)
GLUCOSE-CAPILLARY: 177 mg/dL — AB (ref 65–99)

## 2015-10-07 SURGERY — POSTERIOR CERVICAL FUSION/FORAMINOTOMY LEVEL 4
Anesthesia: General | Site: Spine Cervical

## 2015-10-07 MED ORDER — GABAPENTIN 300 MG PO CAPS
300.0000 mg | ORAL_CAPSULE | Freq: Three times a day (TID) | ORAL | Status: DC
  Administered 2015-10-07 – 2015-10-13 (×16): 300 mg via ORAL
  Filled 2015-10-07 (×17): qty 1

## 2015-10-07 MED ORDER — TRAMADOL HCL 50 MG PO TABS
50.0000 mg | ORAL_TABLET | Freq: Three times a day (TID) | ORAL | Status: DC | PRN
  Administered 2015-10-08 – 2015-10-13 (×9): 50 mg via ORAL
  Filled 2015-10-07 (×9): qty 1

## 2015-10-07 MED ORDER — DOCUSATE SODIUM 100 MG PO CAPS
100.0000 mg | ORAL_CAPSULE | Freq: Two times a day (BID) | ORAL | Status: DC
  Administered 2015-10-07 – 2015-10-13 (×11): 100 mg via ORAL
  Filled 2015-10-07 (×12): qty 1

## 2015-10-07 MED ORDER — NAPROXEN SODIUM 275 MG PO TABS
440.0000 mg | ORAL_TABLET | Freq: Two times a day (BID) | ORAL | Status: DC | PRN
  Administered 2015-10-08: 412.5 mg via ORAL
  Filled 2015-10-07 (×2): qty 2

## 2015-10-07 MED ORDER — ROCURONIUM BROMIDE 10 MG/ML (PF) SYRINGE
PREFILLED_SYRINGE | INTRAVENOUS | Status: AC
  Filled 2015-10-07: qty 10

## 2015-10-07 MED ORDER — SODIUM CHLORIDE 0.9 % IJ SOLN
INTRAMUSCULAR | Status: AC
  Filled 2015-10-07: qty 10

## 2015-10-07 MED ORDER — PHENYLEPHRINE HCL 10 MG/ML IJ SOLN
INTRAVENOUS | Status: DC | PRN
  Administered 2015-10-07: 20 ug/min via INTRAVENOUS

## 2015-10-07 MED ORDER — CHLORHEXIDINE GLUCONATE CLOTH 2 % EX PADS: 6.0000 | MEDICATED_PAD | Freq: Once | CUTANEOUS | Status: DC

## 2015-10-07 MED ORDER — PHENYLEPHRINE 40 MCG/ML (10ML) SYRINGE FOR IV PUSH (FOR BLOOD PRESSURE SUPPORT)
PREFILLED_SYRINGE | INTRAVENOUS | Status: AC
  Filled 2015-10-07: qty 10

## 2015-10-07 MED ORDER — FENTANYL CITRATE (PF) 100 MCG/2ML IJ SOLN
INTRAMUSCULAR | Status: DC | PRN
  Administered 2015-10-07: 200 ug via INTRAVENOUS
  Administered 2015-10-07 (×2): 50 ug via INTRAVENOUS

## 2015-10-07 MED ORDER — FUROSEMIDE 20 MG PO TABS
20.0000 mg | ORAL_TABLET | Freq: Every day | ORAL | Status: DC
  Administered 2015-10-08 – 2015-10-13 (×6): 20 mg via ORAL
  Filled 2015-10-07 (×6): qty 1

## 2015-10-07 MED ORDER — ACETAMINOPHEN 325 MG PO TABS: 650.0000 mg | ORAL_TABLET | ORAL | Status: DC | PRN

## 2015-10-07 MED ORDER — ONDANSETRON HCL 4 MG/2ML IJ SOLN
INTRAMUSCULAR | Status: DC | PRN
  Administered 2015-10-07: 4 mg via INTRAVENOUS

## 2015-10-07 MED ORDER — ONDANSETRON HCL 4 MG/2ML IJ SOLN
INTRAMUSCULAR | Status: AC
  Filled 2015-10-07: qty 2

## 2015-10-07 MED ORDER — LACTATED RINGERS IV SOLN
INTRAVENOUS | Status: DC | PRN
  Administered 2015-10-07 (×2): via INTRAVENOUS

## 2015-10-07 MED ORDER — THROMBIN 5000 UNITS EX SOLR
OROMUCOSAL | Status: DC | PRN
  Administered 2015-10-07: 10:00:00 via TOPICAL

## 2015-10-07 MED ORDER — ARTIFICIAL TEARS OP OINT
TOPICAL_OINTMENT | OPHTHALMIC | Status: AC
  Filled 2015-10-07: qty 3.5

## 2015-10-07 MED ORDER — HYDROMORPHONE HCL 1 MG/ML IJ SOLN
0.5000 mg | INTRAMUSCULAR | Status: DC | PRN
  Administered 2015-10-07 – 2015-10-09 (×6): 1 mg via INTRAVENOUS
  Filled 2015-10-07 (×7): qty 1

## 2015-10-07 MED ORDER — MOMETASONE FURO-FORMOTEROL FUM 100-5 MCG/ACT IN AERO
2.0000 | INHALATION_SPRAY | Freq: Two times a day (BID) | RESPIRATORY_TRACT | Status: DC
  Administered 2015-10-07 – 2015-10-13 (×9): 2 via RESPIRATORY_TRACT
  Filled 2015-10-07: qty 8.8

## 2015-10-07 MED ORDER — SUCCINYLCHOLINE CHLORIDE 200 MG/10ML IV SOSY
PREFILLED_SYRINGE | INTRAVENOUS | Status: AC
  Filled 2015-10-07: qty 10

## 2015-10-07 MED ORDER — SODIUM CHLORIDE 0.9% FLUSH: 3.0000 mL | INTRAVENOUS | Status: DC | PRN

## 2015-10-07 MED ORDER — POLYETHYLENE GLYCOL 3350 17 G PO PACK
17.0000 g | PACK | Freq: Every evening | ORAL | Status: DC | PRN
  Administered 2015-10-11: 17 g via ORAL
  Filled 2015-10-07: qty 1

## 2015-10-07 MED ORDER — IRBESARTAN 75 MG PO TABS
37.5000 mg | ORAL_TABLET | Freq: Every day | ORAL | Status: DC
  Administered 2015-10-08 – 2015-10-13 (×6): 37.5 mg via ORAL
  Filled 2015-10-07 (×6): qty 0.5

## 2015-10-07 MED ORDER — SITAGLIPTIN PHOS-METFORMIN HCL 50-500 MG PO TABS: 1.0000 | ORAL_TABLET | Freq: Two times a day (BID) | ORAL | Status: DC

## 2015-10-07 MED ORDER — 0.9 % SODIUM CHLORIDE (POUR BTL) OPTIME
TOPICAL | Status: DC | PRN
  Administered 2015-10-07: 1000 mL

## 2015-10-07 MED ORDER — NAPROXEN 250 MG PO TABS: 500.0000 mg | ORAL_TABLET | Freq: Two times a day (BID) | ORAL | Status: DC

## 2015-10-07 MED ORDER — PROPOFOL 10 MG/ML IV BOLUS
INTRAVENOUS | Status: DC | PRN
  Administered 2015-10-07 (×2): 100 mg via INTRAVENOUS

## 2015-10-07 MED ORDER — KETOROLAC TROMETHAMINE 30 MG/ML IJ SOLN
INTRAMUSCULAR | Status: AC
  Filled 2015-10-07: qty 1

## 2015-10-07 MED ORDER — FENTANYL CITRATE (PF) 100 MCG/2ML IJ SOLN
INTRAMUSCULAR | Status: AC
  Filled 2015-10-07: qty 2

## 2015-10-07 MED ORDER — SUGAMMADEX SODIUM 200 MG/2ML IV SOLN
INTRAVENOUS | Status: AC
  Filled 2015-10-07: qty 2

## 2015-10-07 MED ORDER — MELOXICAM 7.5 MG PO TABS
7.5000 mg | ORAL_TABLET | Freq: Two times a day (BID) | ORAL | Status: DC
  Administered 2015-10-07 – 2015-10-13 (×11): 7.5 mg via ORAL
  Filled 2015-10-07 (×12): qty 1

## 2015-10-07 MED ORDER — PANTOPRAZOLE SODIUM 40 MG PO TBEC
40.0000 mg | DELAYED_RELEASE_TABLET | Freq: Every day | ORAL | Status: DC
  Administered 2015-10-08 – 2015-10-13 (×6): 40 mg via ORAL
  Filled 2015-10-07 (×6): qty 1

## 2015-10-07 MED ORDER — SURGIFOAM 100 EX MISC
CUTANEOUS | Status: DC | PRN
  Administered 2015-10-07: 10:00:00 via TOPICAL

## 2015-10-07 MED ORDER — MENTHOL 3 MG MT LOZG
1.0000 | LOZENGE | OROMUCOSAL | Status: DC | PRN
  Filled 2015-10-07: qty 9

## 2015-10-07 MED ORDER — ATROPINE SULFATE 0.4 MG/ML IV SOSY
PREFILLED_SYRINGE | INTRAVENOUS | Status: AC
  Filled 2015-10-07: qty 2.5

## 2015-10-07 MED ORDER — PREDNISONE 1 MG PO TABS
1.0000 mg | ORAL_TABLET | Freq: Every day | ORAL | Status: DC
  Administered 2015-10-08 – 2015-10-13 (×6): 1 mg via ORAL
  Filled 2015-10-07 (×6): qty 1

## 2015-10-07 MED ORDER — FLUTICASONE FUROATE-VILANTEROL 100-25 MCG/INH IN AEPB: 1.0000 | INHALATION_SPRAY | Freq: Every day | RESPIRATORY_TRACT | Status: DC

## 2015-10-07 MED ORDER — BACITRACIN ZINC 500 UNIT/GM EX OINT
TOPICAL_OINTMENT | CUTANEOUS | Status: DC | PRN
  Administered 2015-10-07: 1 via TOPICAL

## 2015-10-07 MED ORDER — CYCLOBENZAPRINE HCL 10 MG PO TABS
10.0000 mg | ORAL_TABLET | Freq: Three times a day (TID) | ORAL | Status: DC | PRN
  Administered 2015-10-08 – 2015-10-13 (×8): 10 mg via ORAL
  Filled 2015-10-07 (×9): qty 1

## 2015-10-07 MED ORDER — BACITRACIN 50000 UNITS IM SOLR
INTRAMUSCULAR | Status: DC | PRN
  Administered 2015-10-07: 09:00:00

## 2015-10-07 MED ORDER — FENTANYL CITRATE (PF) 100 MCG/2ML IJ SOLN
25.0000 ug | INTRAMUSCULAR | Status: DC | PRN
  Administered 2015-10-07 (×4): 25 ug via INTRAVENOUS
  Administered 2015-10-07: 50 ug via INTRAVENOUS

## 2015-10-07 MED ORDER — PROMETHAZINE HCL 25 MG/ML IJ SOLN: 6.2500 mg | INTRAMUSCULAR | Status: DC | PRN

## 2015-10-07 MED ORDER — LIDOCAINE 2% (20 MG/ML) 5 ML SYRINGE
INTRAMUSCULAR | Status: AC
  Filled 2015-10-07: qty 5

## 2015-10-07 MED ORDER — MOMETASONE FURO-FORMOTEROL FUM 100-5 MCG/ACT IN AERO: 2.0000 | INHALATION_SPRAY | Freq: Two times a day (BID) | RESPIRATORY_TRACT | Status: DC

## 2015-10-07 MED ORDER — KETOROLAC TROMETHAMINE 30 MG/ML IJ SOLN
30.0000 mg | Freq: Once | INTRAMUSCULAR | Status: AC
  Administered 2015-10-07: 30 mg via INTRAVENOUS

## 2015-10-07 MED ORDER — MEPERIDINE HCL 25 MG/ML IJ SOLN: 6.2500 mg | INTRAMUSCULAR | Status: DC | PRN

## 2015-10-07 MED ORDER — EPHEDRINE SULFATE 50 MG/ML IJ SOLN
INTRAMUSCULAR | Status: AC
  Filled 2015-10-07: qty 1

## 2015-10-07 MED ORDER — PROPOFOL 10 MG/ML IV BOLUS
INTRAVENOUS | Status: AC
  Filled 2015-10-07: qty 20

## 2015-10-07 MED ORDER — WHITE PETROLATUM GEL
Status: AC
  Administered 2015-10-07: 19:00:00
  Filled 2015-10-07: qty 1

## 2015-10-07 MED ORDER — OXYBUTYNIN CHLORIDE ER 15 MG PO TB24
15.0000 mg | ORAL_TABLET | Freq: Every day | ORAL | Status: DC
  Administered 2015-10-07 – 2015-10-11 (×5): 15 mg via ORAL
  Filled 2015-10-07 (×6): qty 1

## 2015-10-07 MED ORDER — ASPIRIN EC 325 MG PO TBEC
325.0000 mg | DELAYED_RELEASE_TABLET | Freq: Every day | ORAL | Status: DC
Start: 2015-10-08 — End: 2015-10-13
  Administered 2015-10-08 – 2015-10-13 (×6): 325 mg via ORAL
  Filled 2015-10-07 (×6): qty 1

## 2015-10-07 MED ORDER — ATORVASTATIN CALCIUM 10 MG PO TABS
10.0000 mg | ORAL_TABLET | Freq: Every day | ORAL | Status: DC
  Administered 2015-10-07 – 2015-10-12 (×6): 10 mg via ORAL
  Filled 2015-10-07 (×6): qty 1

## 2015-10-07 MED ORDER — SODIUM CHLORIDE 0.9 % IV SOLN
250.0000 mL | INTRAVENOUS | Status: DC
Start: 2015-10-07 — End: 2015-10-13
  Administered 2015-10-07: 250 mL via INTRAVENOUS

## 2015-10-07 MED ORDER — CEFAZOLIN SODIUM-DEXTROSE 2-4 GM/100ML-% IV SOLN
2.0000 g | Freq: Three times a day (TID) | INTRAVENOUS | Status: AC
  Administered 2015-10-07 – 2015-10-09 (×6): 2 g via INTRAVENOUS
  Filled 2015-10-07 (×7): qty 100

## 2015-10-07 MED ORDER — PHENOL 1.4 % MT LIQD
1.0000 | OROMUCOSAL | Status: DC | PRN
  Filled 2015-10-07: qty 177

## 2015-10-07 MED ORDER — SUGAMMADEX SODIUM 200 MG/2ML IV SOLN
INTRAVENOUS | Status: DC | PRN
  Administered 2015-10-07: 200 mg via INTRAVENOUS

## 2015-10-07 MED ORDER — LIDOCAINE HCL (CARDIAC) 20 MG/ML IV SOLN
INTRAVENOUS | Status: DC | PRN
  Administered 2015-10-07: 100 mg via INTRAVENOUS

## 2015-10-07 MED ORDER — FENTANYL CITRATE (PF) 100 MCG/2ML IJ SOLN
INTRAMUSCULAR | Status: AC
  Filled 2015-10-07: qty 4

## 2015-10-07 MED ORDER — LIDOCAINE-EPINEPHRINE 1 %-1:100000 IJ SOLN
INTRAMUSCULAR | Status: DC | PRN
  Administered 2015-10-07: 6 mL

## 2015-10-07 MED ORDER — POLYSACCHARIDE IRON COMPLEX 150 MG PO CAPS
150.0000 mg | ORAL_CAPSULE | Freq: Every day | ORAL | Status: DC
  Administered 2015-10-08 – 2015-10-13 (×6): 150 mg via ORAL
  Filled 2015-10-07 (×6): qty 1

## 2015-10-07 MED ORDER — ACETAMINOPHEN 650 MG RE SUPP: 650.0000 mg | RECTAL | Status: DC | PRN

## 2015-10-07 MED ORDER — ACETAMINOPHEN 325 MG PO TABS: 650.0000 mg | ORAL_TABLET | Freq: Three times a day (TID) | ORAL | Status: DC | PRN

## 2015-10-07 MED ORDER — MECLIZINE HCL 12.5 MG PO TABS
25.0000 mg | ORAL_TABLET | Freq: Three times a day (TID) | ORAL | Status: DC | PRN
  Administered 2015-10-11 – 2015-10-12 (×2): 25 mg via ORAL
  Filled 2015-10-07 (×2): qty 2

## 2015-10-07 MED ORDER — METFORMIN HCL 500 MG PO TABS
500.0000 mg | ORAL_TABLET | Freq: Two times a day (BID) | ORAL | Status: DC
  Administered 2015-10-08 – 2015-10-13 (×11): 500 mg via ORAL
  Filled 2015-10-07 (×11): qty 1

## 2015-10-07 MED ORDER — MIDAZOLAM HCL 2 MG/2ML IJ SOLN
INTRAMUSCULAR | Status: AC
  Filled 2015-10-07: qty 2

## 2015-10-07 MED ORDER — ROCURONIUM BROMIDE 100 MG/10ML IV SOLN
INTRAVENOUS | Status: DC | PRN
  Administered 2015-10-07: 20 mg via INTRAVENOUS
  Administered 2015-10-07: 40 mg via INTRAVENOUS

## 2015-10-07 MED ORDER — CLOPIDOGREL BISULFATE 75 MG PO TABS
75.0000 mg | ORAL_TABLET | Freq: Every day | ORAL | Status: DC
Start: 2015-10-08 — End: 2015-10-13
  Administered 2015-10-08 – 2015-10-13 (×6): 75 mg via ORAL
  Filled 2015-10-07 (×6): qty 1

## 2015-10-07 MED ORDER — SODIUM CHLORIDE 0.9% FLUSH
3.0000 mL | Freq: Two times a day (BID) | INTRAVENOUS | Status: DC
  Administered 2015-10-07 – 2015-10-12 (×9): 3 mL via INTRAVENOUS

## 2015-10-07 MED ORDER — LINAGLIPTIN 5 MG PO TABS
5.0000 mg | ORAL_TABLET | Freq: Two times a day (BID) | ORAL | Status: DC
  Administered 2015-10-08 – 2015-10-13 (×11): 5 mg via ORAL
  Filled 2015-10-07 (×11): qty 1

## 2015-10-07 MED ORDER — PANTOPRAZOLE SODIUM 40 MG PO TBEC: 40.0000 mg | DELAYED_RELEASE_TABLET | Freq: Every day | ORAL | Status: DC

## 2015-10-07 MED ORDER — ATORVASTATIN CALCIUM 10 MG PO TABS: 10.0000 mg | ORAL_TABLET | Freq: Every day | ORAL | Status: DC

## 2015-10-07 MED ORDER — LABETALOL HCL 5 MG/ML IV SOLN
INTRAVENOUS | Status: DC | PRN
  Administered 2015-10-07 (×3): 5 mg via INTRAVENOUS

## 2015-10-07 MED ORDER — ONDANSETRON HCL 4 MG/2ML IJ SOLN: 4.0000 mg | INTRAMUSCULAR | Status: DC | PRN

## 2015-10-07 SURGICAL SUPPLY — 64 items
APL SKNCLS STERI-STRIP NONHPOA (GAUZE/BANDAGES/DRESSINGS) ×1
BAG DECANTER FOR FLEXI CONT (MISCELLANEOUS) ×3 IMPLANT
BENZOIN TINCTURE PRP APPL 2/3 (GAUZE/BANDAGES/DRESSINGS) ×4 IMPLANT
BIT DRILL SCRW 3.5 (BIT) ×2 IMPLANT
BLADE SURG 11 STRL SS (BLADE) ×3 IMPLANT
BUR MATCHSTICK NEURO 3.0 LAGG (BURR) ×3 IMPLANT
CANISTER SUCT 3000ML PPV (MISCELLANEOUS) ×3 IMPLANT
CAP LOCKING (Cap) ×10 IMPLANT
CLOSURE WOUND 1/2 X4 (GAUZE/BANDAGES/DRESSINGS) ×1
DECANTER SPIKE VIAL GLASS SM (MISCELLANEOUS) ×3 IMPLANT
DRAPE C-ARM 42X72 X-RAY (DRAPES) ×4 IMPLANT
DRAPE LAPAROTOMY 100X72 PEDS (DRAPES) ×3 IMPLANT
DRAPE POUCH INSTRU U-SHP 10X18 (DRAPES) ×3 IMPLANT
DRAPE SURG 17X23 STRL (DRAPES) ×12 IMPLANT
DRSG OPSITE POSTOP 4X8 (GAUZE/BANDAGES/DRESSINGS) ×2 IMPLANT
DURAPREP 26ML APPLICATOR (WOUND CARE) ×3 IMPLANT
ELECT REM PT RETURN 9FT ADLT (ELECTROSURGICAL) ×3
ELECTRODE REM PT RTRN 9FT ADLT (ELECTROSURGICAL) ×1 IMPLANT
EVACUATOR 1/8 PVC DRAIN (DRAIN) ×2 IMPLANT
GAUZE SPONGE 4X4 12PLY STRL (GAUZE/BANDAGES/DRESSINGS) ×3 IMPLANT
GAUZE SPONGE 4X4 16PLY XRAY LF (GAUZE/BANDAGES/DRESSINGS) IMPLANT
GLOVE BIO SURGEON STRL SZ 6.5 (GLOVE) ×3 IMPLANT
GLOVE BIO SURGEON STRL SZ8 (GLOVE) ×5 IMPLANT
GLOVE BIO SURGEONS STRL SZ 6.5 (GLOVE) ×3
GLOVE BIOGEL PI IND STRL 7.0 (GLOVE) IMPLANT
GLOVE BIOGEL PI IND STRL 7.5 (GLOVE) IMPLANT
GLOVE BIOGEL PI INDICATOR 7.0 (GLOVE) ×2
GLOVE BIOGEL PI INDICATOR 7.5 (GLOVE) ×6
GLOVE INDICATOR 8.5 STRL (GLOVE) ×5 IMPLANT
GLOVE SURG SS PI 7.0 STRL IVOR (GLOVE) ×2 IMPLANT
GOWN STRL REUS W/ TWL LRG LVL3 (GOWN DISPOSABLE) IMPLANT
GOWN STRL REUS W/ TWL XL LVL3 (GOWN DISPOSABLE) ×1 IMPLANT
GOWN STRL REUS W/TWL LRG LVL3 (GOWN DISPOSABLE) ×6
GOWN STRL REUS W/TWL XL LVL3 (GOWN DISPOSABLE) ×6
HEMOSTAT POWDER SURGIFOAM 1G (HEMOSTASIS) ×3 IMPLANT
IMPL QUARTEX 3.5X12MM (Neuro Prosthesis/Implant) ×10 IMPLANT
IMPLANT QUARTEX 3.5X12MM (Neuro Prosthesis/Implant) ×30 IMPLANT
KIT BASIN OR (CUSTOM PROCEDURE TRAY) ×3 IMPLANT
KIT INFUSE X SMALL 1.4CC (Orthopedic Implant) ×2 IMPLANT
KIT ROOM TURNOVER OR (KITS) ×3 IMPLANT
LIQUID BAND (GAUZE/BANDAGES/DRESSINGS) ×3 IMPLANT
LOCKING CAP (Cap) ×30 IMPLANT
MARKER SKIN DUAL TIP RULER LAB (MISCELLANEOUS) ×3 IMPLANT
NDL HYPO 25X1 1.5 SAFETY (NEEDLE) ×1 IMPLANT
NDL SPNL 20GX3.5 QUINCKE YW (NEEDLE) ×1 IMPLANT
NEEDLE HYPO 25X1 1.5 SAFETY (NEEDLE) ×3 IMPLANT
NEEDLE SPNL 20GX3.5 QUINCKE YW (NEEDLE) ×3 IMPLANT
NS IRRIG 1000ML POUR BTL (IV SOLUTION) ×3 IMPLANT
PACK LAMINECTOMY NEURO (CUSTOM PROCEDURE TRAY) ×3 IMPLANT
PAD ARMBOARD 7.5X6 YLW CONV (MISCELLANEOUS) ×9 IMPLANT
PIN MAYFIELD SKULL DISP (PIN) ×3 IMPLANT
PUTTY BONE DBX 5CC MIX (Putty) ×2 IMPLANT
ROD SPINE POST 3.5X80 (Rod) ×4 IMPLANT
SPONGE SURGIFOAM ABS GEL 100 (HEMOSTASIS) ×3 IMPLANT
STRIP BIOACTIVE 10CC 25X50X8 (Miscellaneous) ×2 IMPLANT
STRIP CLOSURE SKIN 1/2X4 (GAUZE/BANDAGES/DRESSINGS) ×2 IMPLANT
SUT VIC AB 0 CT1 18XCR BRD8 (SUTURE) ×1 IMPLANT
SUT VIC AB 0 CT1 8-18 (SUTURE) ×3
SUT VIC AB 2-0 CT1 18 (SUTURE) ×3 IMPLANT
SUT VIC AB 4-0 PS2 27 (SUTURE) ×2 IMPLANT
TOWEL OR 17X24 6PK STRL BLUE (TOWEL DISPOSABLE) ×3 IMPLANT
TOWEL OR 17X26 10 PK STRL BLUE (TOWEL DISPOSABLE) ×3 IMPLANT
TRAY FOLEY W/METER SILVER 16FR (SET/KITS/TRAYS/PACK) ×2 IMPLANT
WATER STERILE IRR 1000ML POUR (IV SOLUTION) ×3 IMPLANT

## 2015-10-07 NOTE — H&P (Signed)
Charles [name] is an 60 y.o. male.   Chief Complaint: Balance coordination difficulty neck pain and numbness in his arms and hands HPI: 60 year old gentleman with multiple medical problems previous right-sided hemispheric stroke resulting in left spastic hemiparesis also has a history of sarcoidosis diabetes who presents with worsening weakness in his arm and hand and worsening coordination. Patient has been worked up extensively is followed by neurology as well as oncology and after extensive neurologic workup with MRI scan of his cervical spine showed severe cord compression with signal change within the spinal cord from severe stenosis C3-4 and C4-5. Some moderate stenosis below this at C5-6 and C6-7. Due to patient's progressive clinical syndrome failure conservative treatment imaging findings we've recommended decompression of the cervical spine with lateral mass fixation. I've extensively gone over the risks and benefits of decompression and fusion with the patient as well as perioperative course expectations of outcome and alternatives surgery and he understands and agrees to proceed forward.  Past Medical History:  Diagnosis Date  . Anemia   . Asthma   . Essential hypertension, benign   . GERD (gastroesophageal reflux disease)   . Headache    migraines in the past per pt  . Hearing impaired   . History of stroke    Spastic hemiplegia, right MCA stroke April 2014 in [place]  . Mixed hyperlipidemia   . PAD (peripheral artery disease) ([place])   . Sarcoidosis ([place])   . Stroke [facility])    2014, left sided weakness  . Type 2 diabetes mellitus ([place])     Past Surgical History:  Procedure Laterality Date  . BRAVO PH STUDY N/A 06/18/2013   pH STUDY SHOWS NEXIUM TWICE DAILY CONTROLS THE ACID IN HIS STOMACH. HE HAD VERY FEWEPISODE OF REGURGITATION RECORDED IN THE 2 DAYS THE STUDY WAS PERFORMED.   [name] COLONOSCOPY  2011   [place]  . ESOPHAGOGASTRODUODENOSCOPY N/A 06/18/2013   Dr.Fields-  probable proximal esophageal web,dilation performed, bravo cap placed, mild non-erosive gastritis in the gastric antrum and on the greater curvature of the gastric body bx- granulomatous gastritis, duodenal mucosa showed no abnormalities in the bulb and second portion of the duodenum.   . ESOPHAGOGASTRODUODENOSCOPY N/A 05/05/2015   Procedure: ESOPHAGOGASTRODUODENOSCOPY (EGD);  Surgeon: Danie Binder, MD;  [facility] END[facility];  Service: Endoscopy;  Laterality: N/A;  730  . MALONEY DILATION N/A 06/18/2013   Procedure: MALONEY DILATION;  Surgeon: Danie Binder, MD;  Location: [facility] ENDO SUITE;  Service: Endoscopy;  Laterality: N/A;  . OTHER SURGICAL HISTORY     cyst removal head and chest  . SAVORY DILATION N/A 06/18/2013   Procedure: SAVORY DILATION;  Surgeon: Danie Binder, MD;  Location: [facility] ENDO SUITE;  Service: Endoscopy;  Laterality: N/A;  . TEE WITHOUT CARDIOVERSION N/A 10/04/2012   Procedure: TRANSESOPHAGEAL ECHOCARDIOGRAM (TEE);  Surgeon: Thayer Headings, MD;  Location: [facility] ENDOSCOPY;  Service: Cardiovascular;  Laterality: N/A;  . [place] accident  Both legs fractured and repaired surgically    Family History  Problem Relation Age of Onset  . Diabetes Mother   . Hypertension Mother   . Diabetes Father   . Hypertension Father   . Colon polyps Neg Hx   . Colon cancer Neg Hx    Social History:  reports that he quit smoking about 23 years ago. His smoking use included Cigarettes. He has a 29.00 pack-year smoking history. He has never used smokeless tobacco. He reports that  he drinks alcohol. He reports that he uses drugs.  Allergies:  Allergies  Allergen Reactions  . Shellfish Allergy Hives and Swelling    SWELLING REACTION UNSPECIFIED   . Contrast Media [Iodinated Diagnostic Agents] Itching  . Oxycodone Nausea And Vomiting    Facility-Administered Medications Prior to Admission  Medication Dose Route Frequency Provider Last Rate Last Dose  .  botulinum toxin Type A (BOTOX) injection 300 Units  300 Units Intramuscular Once Drema Dallas, DO       Medications Prior to Admission  Medication Sig Dispense Refill  . acetaminophen (TYLENOL) 650 MG CR tablet Take 650 mg by mouth every 8 (eight) hours as needed for pain.    [name] aspirin 325 MG EC tablet Take 325 mg by mouth daily.    [name] atorvastatin (LIPITOR) 10 MG tablet Take 10 mg by mouth daily. (cholesterol)    . BENICAR 20 MG tablet take 1 tablet by mouth once daily 30 tablet 0  . budesonide-formoterol (SYMBICORT) 80-4.5 MCG/ACT inhaler Inhale 2 puffs into the lungs 2 (two) times daily as needed (shortness of breath/ wheezing).    . clopidogrel (PLAVIX) 75 MG tablet Take 75 mg by mouth daily.    [name] esomeprazole (NEXIUM) 40 MG capsule Take 1 capsule (40 mg total) by mouth 2 (two) times daily. 60 capsule 0  . furosemide (LASIX) 20 MG tablet Take 20 mg by mouth daily.     [name] gabapentin (NEURONTIN) 300 MG capsule Take 1 capsule (300 mg total) by mouth 3 (three) times daily. 90 capsule 11  . meloxicam (MOBIC) 7.5 MG tablet Take 1 tablet (7.5 mg total) by mouth 2 (two) times daily. 60 tablet 6  . mometasone-formoterol (DULERA) 100-5 MCG/ACT AERO Take 2 puffs first thing in am and then another 2 puffs about 12 hours later. (Patient taking differently: Inhale 2 puffs into the lungs every 12 (twelve) hours. ) 1 Inhaler 11  . naproxen (NAPROSYN) 500 MG tablet Take 500 mg by mouth 2 (two) times daily with a meal.  0  . naproxen sodium (ALEVE) 220 MG tablet Take 440 mg by mouth 2 (two) times daily as needed (pain).    [name] oxybutynin (DITROPAN XL) 15 MG 24 hr tablet Take 15 mg by mouth at bedtime.    . polyethylene glycol powder (GLYCOLAX/MIRALAX) powder Take 17 g by mouth at bedtime as needed for mild constipation. Mix in 8 oz liquid and drink    . predniSONE (DELTASONE) 1 MG tablet Take 1 mg by mouth daily with breakfast.    . sitaGLIPtin-metformin (JANUMET) 50-500 MG tablet Take 1 tablet by mouth 2 (two)  times daily with a meal.    . traMADol (ULTRAM) 50 MG tablet Take 1 tablet (50 mg total) by mouth every 8 (eight) hours as needed. (Patient taking differently: Take 50 mg by mouth every 8 (eight) hours as needed (pain). ) 60 tablet 3  . fluticasone furoate-vilanterol (BREO ELLIPTA) 100-25 MCG/INH AEPB Inhale 1 puff into the lungs daily.    . iron polysaccharides (NIFEREX) 150 MG capsule Take 1 capsule (150 mg total) by mouth daily. 30 capsule 2  . meclizine (ANTIVERT) 25 MG tablet Take 25 mg by mouth 3 (three) times daily as needed for dizziness.    [name] NEXIUM 40 MG capsule take 1 capsule by mouth 30 MINUTES PRIOR TO MEALS----TAKE TWICE A DAY (Patient not taking: Reported on 09/28/2015) 60 capsule 11    Results for orders placed or performed during the hospital encounter of  10/07/15 (from the past 48 hour(s))  Glucose, capillary     Status: Abnormal   Collection Time: 10/07/15  7:17 AM  Result Value Ref Range   Glucose-Capillary 121 (H) 65 - 99 mg/dL   Comment 1 Notify RN    Comment 2 Document in Chart    No results found.  Review of Systems  Constitutional: Negative.   HENT: Negative.   Eyes: Negative.   Respiratory: Negative.   Cardiovascular: Negative.   Gastrointestinal: Negative.   Musculoskeletal: Positive for falls, myalgias and neck pain.  Skin: Negative.   Neurological: Positive for tingling and sensory change.    Blood pressure (!) 174/92, pulse 77, temperature 97.7 F (36.5 C), temperature source Oral, resp. rate 18, weight 69.9 kg (154 lb), SpO2 100 %. Physical Exam  Constitutional: He appears well-developed and well-nourished.  HENT:  Head: Normocephalic.  Eyes: Pupils are equal, round, and reactive to light.  GI: Soft. Bowel sounds are normal.  Neurological: He is alert. GCS eye subscore is 4. GCS verbal subscore is 5. GCS motor subscore is 6.  Patient has a baseline left spastic hemiparesis from previous right hemispheric stroke right-sided strength appears to be 5  out of 5     Assessment/Plan 60 year old presents for a C3-C7 decompressive cervical laminectomy and fusion.  Daimion Adamcik P, MD 10/07/2015, 7:59 AM

## 2015-10-07 NOTE — Anesthesia Postprocedure Evaluation (Signed)
Anesthesia Post Note  Patient: Charles [name]  Procedure(s) Performed: Procedure(s) (LRB): Cervical Three-Four, Cervical Four-Five, Cervical Five-Six, Cervical Six-Seven Posterior Cervical Laminectomy and Fusion (N/A)  Patient location during evaluation: PACU Anesthesia Type: General Level of consciousness: awake Pain management: pain level not controlled Vital Signs Assessment: post-procedure vital signs reviewed and stable Respiratory status: spontaneous breathing Cardiovascular status: stable Postop Assessment: no signs of nausea or vomiting Anesthetic complications: no     Last Vitals:  Vitals:   10/07/15 1200 10/07/15 1215  BP: 127/85 (!) 158/103  Pulse: 83 91  Resp: 20 20  Temp:      Last Pain:  Vitals:   10/07/15 1200  TempSrc:   PainSc: Asleep   Pain Goal:                 [name],[name]

## 2015-10-07 NOTE — Transfer of Care (Signed)
Immediate Anesthesia Transfer of Care Note  Patient: Charles [name]  Procedure(s) Performed: Procedure(s): Cervical Three-Four, Cervical Four-Five, Cervical Five-Six, Cervical Six-Seven Posterior Cervical Laminectomy and Fusion (N/A)  Patient Location: PACU  Anesthesia Type:General  Level of Consciousness: sedated and patient cooperative  Airway & Oxygen Therapy: Patient Spontanous Breathing and Patient connected to nasal cannula oxygen  Post-op Assessment: Report given to RN, Post -op Vital signs reviewed and stable and Patient moving all extremities  Post vital signs: Reviewed and stable  Last Vitals:  Vitals:   10/07/15 1130 10/07/15 1145  BP: (!) 168/111 (!) 164/111  Pulse: 94 82  Resp: 20 18  Temp: 36.5 C     Last Pain:  Vitals:   10/07/15 1130  TempSrc:   PainSc: Asleep         Complications: No apparent anesthesia complications

## 2015-10-07 NOTE — Op Note (Signed)
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  Postoperative diagnosis: Same  Procedure: #1 decompressive cervical laminectomy C3, C4, C5, and C6. With foraminotomies of the C4, C5, C6 nerve roots  #2 lateral mass fixation utilizing the globus ellipse lateral mass screw system from C3-C7  #3 posterior lateral arthrodesis C3-C7 utilizing locally harvested autograft mixed with DBX and BMP  Surgeon: [name] Emilyanne Mcgough  Assistant: [name] ditty  Anesthesia: Gen.  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 Workup has revealed severe stenosis with signal change within the spinal cord from C3-4 primarily but extending down to C6. Due to patient's progressive conical syndrome failed conservative treatment imaging findings I recommended decompressive cervical laminectomy C3-C7 with posterior cervical augmentation. I extensively went over the risks and benefits of the operation with the patient as well as perioperative course expectations of outcome and alternatives of surgery and he and the family understood and agreed to proceed forward.  Operative procedure: Patient brought into the or was induced under general anesthesia positioned prone in pins back side his neck was prepped and draped in routine sterile fashion a midline incision was made after infiltration of 10 mL lidocaine with epi and Bovie light cautery was used to calcification subperiosteal dissections care lamina of C3-C7 bilaterally. Exposed the lateral masses from C3-C7 bilaterally. Then using a high-speed drill pilot holes were drilled in the inferomedial quadrant of lateral mass and screw holes were  drilled extending out to the posterior superior quadrant at all 5 lateral masses at C3, C4, C5, C6, and C7. Then these were probed near cortex was tapped and 12 mm screws were inserted bilaterally at C3, C4, C5, C6, and C7. Then the spinous process at C3-4-5 and 6 were then removed central decompression was begun there was marked stenosis. Primarily at C3-4 but also at C4-5 and C5-6 and the laminotomy was performed by extending 2 mm Kerrison punches down along the lateral gutters and then removing the central lamina en bloc decompressed central canal canal minimizing any mutilation spinal cord and thecal sac. Marking along both gutters confirm decompression fluoroscopy confirmed the cephalocaudal margins of the decompression as well as good position of all the implants. At this point I aggressively decorticated the facet complexes and interfacet spaces packed local harvested autograft mixed with DBX and BMP and kinex posterior laterally from C3-C7 and in the facet joints at C3-4, C4-5, C5-6, and C6-7. Then I selected rods anchor the knots gently compressed C3-4 and C4-5 to restore some lordosis. Then meticulous in space was maintained Gelfoam was overlaid top of the dura the muscle fascia proximal in layers with Vicryl skin was closed running 4 subcuticular Dermabond benzo and Steri-Strips and sterile dressing was applied and patient recovered in stable condition. At the end the case all needle counts sponge counts were correct.

## 2015-10-07 NOTE — Progress Notes (Signed)
Pt's family is surrounding pt's bed. One of the members in the family was moving pt's head with brace on. Asking pt's family not to move pt's head, they requested an interpreter at all times. This nurse called deaf and mute interpreter line, they are going to get ipad on a pole and will send someone up for shift change. In the meantime will give pencil and paper to write. He can read and write.

## 2015-10-07 NOTE — Telephone Encounter (Signed)
CALLED PT'S MOTHER. LVM TO CALL WITH CONCERNS-[phone].

## 2015-10-07 NOTE — Anesthesia Procedure Notes (Signed)
Procedure Name: Intubation Date/Time: 10/07/2015 9:05 AM Performed by: Izora Gala Pre-anesthesia Checklist: Patient identified, Emergency Drugs available, Suction available and Patient being monitored Patient Re-evaluated:Patient Re-evaluated prior to inductionOxygen Delivery Method: Circle system utilized Preoxygenation: Pre-oxygenation with 100% oxygen Intubation Type: IV induction Ventilation: Mask ventilation without difficulty Laryngoscope Size: Glidescope (Elective Glidescope per surgeon request) Grade View: Grade I Tube type: Oral Tube size: 7.5 mm Number of attempts: 1 Airway Equipment and Method: Stylet and Bite block Placement Confirmation: ETT inserted through vocal cords under direct vision,  positive ETCO2 and breath sounds checked- equal and bilateral Secured at: 22 cm Tube secured with: Tape Dental Injury: Teeth and Oropharynx as per pre-operative assessment

## 2015-10-08 ENCOUNTER — Encounter (HOSPITAL_COMMUNITY): Payer: Self-pay | Admitting: Neurosurgery

## 2015-10-08 LAB — GLUCOSE, CAPILLARY
GLUCOSE-CAPILLARY: 148 mg/dL — AB (ref 65–99)
GLUCOSE-CAPILLARY: 137 mg/dL — AB (ref 65–99)
Glucose-Capillary: 164 mg/dL — ABNORMAL HIGH (ref 65–99)

## 2015-10-08 NOTE — Progress Notes (Signed)
This Probation officer was confronted by 4 angry family members regarding the patient's care. They are demanding a 24/7 interpreter at bedside, a 1:1 nurse tech at bedside 24/7 and capability to do video interpreting at the bedside. I discussed the qualifications for a Air cabin crew and the family is insistant that the patient does not need a Air cabin crew just a nurse tech that can stay at his bedside or sit outside of his room at all times. I explained that this was not possible but that I would discuss with staff to come as quickly as able. They do not want staff to say anything when answering the call button, just send someone in the room. This has been relayed to the unit Network engineer.  They also expressed concerns about staff attempting to talk to the patient when they come in the room, at first they said they didn't want staff talking to the patient but then said they wanted staff to talk directly to his face for him to lip read. I explained that staff could do that but we also want to make sure the patient can fully understand what is going on so will continue to use interpreter services and writing on a pad. The patient's mother also made several comments about the patient's comprehension. From their statements it is very unclear if the patient is even understanding the interpreters or writing information down. Nevertheless, will ask staff to continue to write information down.   Per notes from last night the patient indicated to the interpreter that he did not want to do video interpreting, but to have face to face interactions. Will respect the patient's wishes at this point.  The family was very hostile towards this Probation officer and towards the patient's nursing staff today. Will ask unit AD to follow up with this family tomorrow regarding these concerns.

## 2015-10-08 NOTE — Evaluation (Signed)
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  Now s/p decompressive cervical laminectomy C3, C4, C5, and C6. With foraminotomies of the C4, C5, C6 nerve roots, lateral mass fixation utilizing the globus ellipse lateral mass screw system from C3-C7, and posterior lateral arthrodesis C3-C7 utilizing locally harvested autograft mixed with DBX and BMP.  Clinical Impression  Patient presents with decreased independence with mobility due to deficits listed in PT problem list.  He will benefit from skilled PT in the acute setting to allow return home with family support and follow up HHPT.     Follow Up Recommendations Home health PT    Equipment Recommendations  None recommended by PT    Recommendations for Other Services       Precautions / Restrictions Precautions Precautions: Cervical;Fall      Mobility  Bed Mobility Overal bed mobility: Needs Assistance Bed Mobility: Rolling;Sidelying to Sit;Sit to Sidelying Rolling: Min assist Sidelying to sit: Mod assist     Sit to sidelying: Mod assist General bed mobility comments: cues for technique, assist for L leg off bed and to lift trunk upright due to pain, cues to use rail, assist to supine for L leg and technique  Transfers Overall transfer level: Needs assistance Equipment used: Quad cane Transfers: Sit to/from Stand Sit to Stand: Min assist         General transfer comment: assist up from bed due to pain  Ambulation/Gait Ambulation/Gait assistance: Min assist Ambulation Distance (Feet): 10 Feet (x 2) Assistive device: Quad cane Gait  Pattern/deviations: Step-to pattern;Decreased stance time - left;Shuffle;Decreased dorsiflexion - left;Steppage;Wide base of support     General Gait Details: slow and painful with steadying assist  Stairs            Wheelchair Mobility    Modified Rankin (Stroke Patients Only)       Balance Overall balance assessment: Needs assistance   Sitting balance-Leahy Scale: Good       Standing balance-Leahy Scale: Poor Standing balance comment: min A given for balance while using bathroom                             Pertinent Vitals/Pain Pain Assessment: 0-10 Pain Score: 9  Pain Location: post neck Pain Descriptors / Indicators: Aching;Sore;Operative site guarding Pain Intervention(s): Monitored during session;Limited activity within patient's tolerance;Repositioned;Patient requesting pain meds-RN notified    Home Living Family/patient expects to be discharged to:: Private residence Living Arrangements: Other relatives (brother) Available Help at Discharge: Family;Available 24 hours/day Type of Home: House Home Access: Ramped entrance     Home Layout: One level Home Equipment: Cane - quad;Wheelchair - manual;Grab bars - tub/shower      Prior Function Level of Independence: Needs assistance   Gait / Transfers Assistance Needed: brother assists at time, but can transfer to w/c unaided  ADL's / Homemaking Assistance Needed: assist with ADL's due to nonfunctional L UE        Hand Dominance   Dominant Hand: Right    Extremity/Trunk Assessment   Upper Extremity Assessment: Defer to OT evaluation           Lower Extremity Assessment: LLE deficits/detail  LLE Deficits / Details: spastic hemiparesis with excessive tone making active movement into flexion difficult; keeps hip externally rotated, and hip and knee flexed  Cervical / Trunk Assessment: Other exceptions  Communication   Communication: Deaf;Interpreter utilized  Cognition  Arousal/Alertness: Awake/alert Behavior During Therapy: WFL for tasks assessed/performed Overall Cognitive Status: Within Functional Limits for tasks assessed                      General Comments General comments (skin integrity, edema, etc.): handout for cervical precautions given and reviewed with assist of interpreter    Exercises        Assessment/Plan    PT Assessment Patient needs continued PT services  PT Diagnosis Acute pain;Abnormality of gait   PT Problem List Decreased strength;Decreased balance;Decreased mobility;Decreased knowledge of precautions;Decreased knowledge of use of DME;Impaired tone  PT Treatment Interventions DME instruction;Gait training;Functional mobility training;Balance training;Therapeutic exercise;Therapeutic activities;Patient/family education   PT Goals (Current goals can be found in the Care Plan section) Acute Rehab PT Goals Patient Stated Goal: To go home PT Goal Formulation: With patient Time For Goal Achievement: 10/15/15 Potential to Achieve Goals: Good    Frequency Min 5X/week   Barriers to discharge        Co-evaluation               End of Session Equipment Utilized During Treatment: Cervical collar Activity Tolerance: Patient limited by pain Patient left: in bed;with call bell/phone within reach;Other (comment) (interpreter in room) Nurse Communication: Patient requests pain meds         Time: [phone] PT Time Calculation (min) (ACUTE ONLY): 27 min   Charges:   PT Evaluation $PT Eval Moderate Complexity: 1 Procedure PT Treatments $Gait Training: 8-22 mins   PT G Codes:        Reginia Naas 10-19-2015, 10:16 AM  Magda Kiel, [place] 2015/10/19

## 2015-10-08 NOTE — Progress Notes (Signed)
[employer] arrived and measured for new aspen collar Charles [name]

## 2015-10-08 NOTE — Care Management Note (Signed)
Case Management Note  Patient Details  Name: EXEQUIEL ZILKA MRN: [id number] Date of Birth: [birth year]  Subjective/Objective:   Pt underwent:  Cervical Three-Four, Cervical Four-Five, Cervical Five-Six, Cervical Six-Seven Posterior Cervical Laminectomy and Fusion. Pt is from home with relatives.                 Action/Plan: PT/OT recommendation is for [facility] services. CM following for d/c needs.   Expected Discharge Date:                  Expected Discharge Plan:  [place]  In-House Referral:     Discharge planning Services     Post Acute Care Choice:    Choice offered to:     DME Arranged:    DME Agency:     HH Arranged:    [place] Agency:     Status of Service:  In process, will continue to follow  If discussed at Long Length of Stay Meetings, dates discussed:    Additional Comments:  Pollie Friar, RN 10/08/2015, 2:07 PM

## 2015-10-08 NOTE — Progress Notes (Signed)
Pt was assessed, and questions answered appropriately with the aid of the interpreter present at 2000, family at bedside, pt prefers face to face interaction with an interpreter,versus the video interaction, an interpreter was scheduled to come in the morning at 0800, pt's mother called to verify pt's night medications, same given accordingly, pt reassured, will however continue to monitor. Obasogie-Asidi, Sharada Albornoz Efe

## 2015-10-08 NOTE — Progress Notes (Signed)
Patient ID: Charles [name], male   DOB: 06-23-1955, 60 y.o.   MRN: [id number] Communicated with patient through interpreter patient has condition neck pain but improved feeling in his arm and hand still some numbness and tingling.  Patient appears to be at his neurologic baseline he was ambulating with physical therapy  Continue physical occupational therapy more than likely patient will need rehabilitation placement

## 2015-10-08 NOTE — Evaluation (Signed)
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  Now s/p decompressive cervical laminectomy C3, C4, C5, and C6. With foraminotomies of the C4, C5, C6 nerve roots, lateral mass fixation utilizing the globus ellipse lateral mass screw system from C3-C7, and posterior lateral arthrodesis C3-C7 utilizing locally harvested autograft mixed with DBX and BMP.   Clinical Impression   Pt reports he was independent with BADL PTA with use of AE. Currently pt requiring min assist for basic transfers and mod assist overall for ADL. Began cervical, safety, and ADL education with pt. Pt planning to d/c home with 24/7 supervision from family. Recommending HHOT for follow up in order to maximize independence and safety with ADL and functional mobility upon return home. Pt would benefit from continued skilled OT to address established goals.    Follow Up Recommendations  Home health OT;Supervision/Assistance - 24 hour    Equipment Recommendations  3 in 1 bedside comode    Recommendations for Other Services       Precautions / Restrictions Precautions Precautions: Cervical;Fall Precaution Comments: Educated pt on cervical precautions. Required Braces or Orthoses: Cervical Brace Cervical Brace: Hard collar Restrictions Weight Bearing Restrictions: No      Mobility Bed Mobility Overal bed mobility: Needs Assistance Bed Mobility: Rolling;Sidelying to Sit;Sit to Sidelying Rolling: Min guard Sidelying to sit: Min assist     Sit to sidelying: Min assist General bed mobility comments: cues for log roll technique. HOB flat with  use of bed rails.  Transfers Overall transfer level: Needs assistance Equipment used: Quad cane Transfers: Sit to/from Stand Sit to Stand: Min assist         General transfer comment: from EOB.    Balance Overall balance assessment: Needs assistance   Sitting balance-Leahy Scale: Good     Standing balance support: Single extremity supported Standing balance-Leahy Scale: Poor Standing balance comment: min A given for balance while using bathroom                            ADL Overall ADL's : Needs assistance/impaired Eating/Feeding: Set up;Sitting   Grooming: Supervision/safety;Sitting;Set up   Upper Body Bathing: Moderate assistance;Sitting   Lower Body Bathing: Moderate assistance;Sit to/from stand   Upper Body Dressing : Total assistance;Sitting Upper Body Dressing Details (indicate cue type and reason): Pt noted to have cervical collar donned upside down upon arrival. Total assist to re-adjust cervical collar. Lower Body Dressing: Moderate assistance;Sit to/from stand                 General ADL Comments: Pt c/o pain in neck and just worked with PT; declining functional mobility or ADL at this time. Agreeable to sit to stand with side stepping to reposition in bed; min assist for sit to stand from EOB and for side step x 3.     Vision Additional Comments: Appears WFL.   Perception     Praxis      Pertinent Vitals/Pain Pain Assessment: 0-10 Pain Score: 10-Worst pain ever Pain Location: neck with movement, 9 in neck at rest Pain Descriptors / Indicators: Aching;Guarding;Grimacing Pain Intervention(s): Limited activity within patient's tolerance;Monitored during session;Premedicated before session  Hand Dominance Right   Extremity/Trunk Assessment Upper Extremity Assessment Upper Extremity Assessment: LUE deficits/detail LUE Deficits / Details: spastic hemi from prior CVA. fingers tightly flexed; able to range to full extension. Has  wrist splint that he uses at home. LUE Sensation: decreased light touch LUE Coordination: decreased fine motor;decreased gross motor   Lower Extremity Assessment Lower Extremity Assessment: Defer to PT evaluation LLE Deficits / Details: spastic hemiparesis with excessive tone making active movement into flexion difficult; keeps hip externally rotated, and hip and knee flexed   Cervical / Trunk Assessment Cervical / Trunk Assessment: Other exceptions Cervical / Trunk Exceptions: retracted L trunk from spastic hemiparesis. s/p cervical sx.   Communication Communication Communication: Deaf;Interpreter utilized   Cognition Arousal/Alertness: Awake/alert Behavior During Therapy: WFL for tasks assessed/performed Overall Cognitive Status: Within Functional Limits for tasks assessed                     General Comments       Exercises       Shoulder Instructions      Home Living Family/patient expects to be discharged to:: Private residence Living Arrangements: Other relatives (brother) Available Help at Discharge: Family;Available 24 hours/day Type of Home: House Home Access: Ramped entrance     Home Layout: One level     Bathroom Shower/Tub: Tub/shower unit Shower/tub characteristics: Architectural technologist: Standard     Home Equipment: Cane - quad;Wheelchair - manual;Grab bars - tub/shower;Shower seat          Prior Functioning/Environment Level of Independence: Needs assistance  Gait / Transfers Assistance Needed: brother assists at time, but can transfer to w/c unaided ADL's / Homemaking Assistance Needed: pt reports on OT eval that he is pretty independent with BADL; can do bathing/dressing independently PTA.        OT Diagnosis: Generalized weakness;Acute pain;Hemiplegia non-dominant side   OT Problem List: Decreased strength;Decreased range of motion;Decreased activity tolerance;Impaired balance (sitting and/or standing);Decreased safety  awareness;Decreased knowledge of use of DME or AE;Decreased knowledge of precautions;Impaired sensation;Impaired tone;Impaired UE functional use;Pain   OT Treatment/Interventions: Self-care/ADL training;Energy conservation;DME and/or AE instruction;Therapeutic activities;Patient/family education;Balance training    OT Goals(Current goals can be found in the care plan section) Acute Rehab OT Goals Patient Stated Goal: To go home OT Goal Formulation: With patient Time For Goal Achievement: 10/22/15 Potential to Achieve Goals: Good ADL Goals Pt Will Perform Grooming: with supervision;standing Pt Will Perform Lower Body Bathing: with adaptive equipment;sit to/from stand;with min guard assist Pt Will Perform Lower Body Dressing: sit to/from stand;with min guard assist;with adaptive equipment Pt Will Transfer to Toilet: with min guard assist;ambulating;bedside commode Pt Will Perform Tub/Shower Transfer: Tub transfer;with min guard assist;shower seat;ambulating  OT Frequency: Min 3X/week   Barriers to D/C:            Co-evaluation              End of Session Equipment Utilized During Treatment: Gait belt;Cervical collar;Other (comment) (quad cane) Nurse Communication: Mobility status  Activity Tolerance: Patient limited by pain Patient left: in bed;with call bell/phone within reach;with bed alarm set;with nursing/sitter in room   Time: [id number] OT Time Calculation (min): 20 min Charges:  OT General Charges $OT Visit: 1 Procedure OT Evaluation $OT Eval Moderate Complexity: 1 Procedure G-Codes:     Binnie Kand M.S., OTR/L Pager: [phone]  10/08/2015, 1:37 PM

## 2015-10-09 LAB — GLUCOSE, CAPILLARY
GLUCOSE-CAPILLARY: 132 mg/dL — AB (ref 65–99)
Glucose-Capillary: 109 mg/dL — ABNORMAL HIGH (ref 65–99)
Glucose-Capillary: 129 mg/dL — ABNORMAL HIGH (ref 65–99)
Glucose-Capillary: 131 mg/dL — ABNORMAL HIGH (ref 65–99)

## 2015-10-09 MED ORDER — HYDROCODONE-ACETAMINOPHEN 10-325 MG PO TABS
2.0000 | ORAL_TABLET | ORAL | Status: DC | PRN
  Administered 2015-10-09 – 2015-10-12 (×12): 2 via ORAL
  Filled 2015-10-09 (×13): qty 2

## 2015-10-09 MED ORDER — OXYCODONE HCL 5 MG PO TABS: 15.0000 mg | ORAL_TABLET | ORAL | Status: DC | PRN

## 2015-10-09 NOTE — Progress Notes (Signed)
Patient ID: Charles [name], male   DOB: May 28, 1955, 60 y.o.   MRN: [id number] Overall doing well condition neck pain but some improvement M is.  Neurologically stable ambulate with physical therapy  Incision clean dry and intact  Continue mobilizes physical and occupational therapy. Patient will need to be placed he is not safe to go home certainly would prefer inpatient rehabilitation at [facility]

## 2015-10-09 NOTE — Progress Notes (Signed)
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  Now s/p decompressive cervical laminectomy C3, C4, C5, and C6. With foraminotomies of the C4, C5, C6 nerve roots, lateral mass fixation utilizing the globus ellipse lateral mass screw system from C3-C7, and posterior lateral arthrodesis C3-C7 utilizing locally harvested autograft mixed with DBX and BMP.    PT Comments    Patient progressing with ambulation this session.  Still with pain during activity, but able to walk better and farther with shoes and AFO on.  Discussed d/c recommendations for HHPT and pt seems agreeable.  Will continue to follow.   Follow Up Recommendations  Home health PT     Equipment Recommendations  None recommended by PT    Recommendations for Other Services       Precautions / Restrictions Precautions Precautions: Fall;Cervical Precaution Comments: Reviewed cervical precautions. Required Braces or Orthoses: Cervical Brace;Other Brace/Splint Cervical Brace: Hard collar Other Brace/Splint: L resting hand splint donned at end of session Restrictions Weight Bearing Restrictions: No    Mobility  Bed Mobility               General bed mobility comments: up in chair  Transfers Overall transfer level: Needs assistance Equipment used: Quad cane (L AFO) Transfers: Sit to/from Stand Sit to Stand: Min guard         General transfer comment: assist for safety  Ambulation/Gait Ambulation/Gait assistance: Min assist;Min guard Ambulation Distance (Feet): 100 Feet Assistive device: Quad cane Gait Pattern/deviations: Step-to  pattern;Decreased stance time - left;Wide base of support;Decreased stride length     General Gait Details: donned shoes and metal upright AFO on L.  Patient ambulating with upright posture, slow pace, circumduction of L for clearance due to unable to flex knee, min A for balance/offload L UE to see if helps with pain   Stairs            Wheelchair Mobility    Modified Rankin (Stroke Patients Only)       Balance Overall balance assessment: Needs assistance Sitting-balance support: Feet supported;No upper extremity supported Sitting balance-Leahy Scale: Good     Standing balance support: Single extremity supported Standing balance-Leahy Scale: Fair Standing balance comment: can stand wide BOS no device                     Cognition Arousal/Alertness: Awake/alert Behavior During Therapy: WFL for tasks assessed/performed Overall Cognitive Status: Within Functional Limits for tasks assessed                      Exercises      General Comments General comments (skin integrity, edema, etc.): interpreter present during session      Pertinent Vitals/Pain Pain Assessment: 0-10 Pain Score: 10-Worst pain ever Pain Location: neck Pain Descriptors / Indicators: Aching Pain Intervention(s): Monitored during session;Limited activity within patient's tolerance;Premedicated before session    Home Living                      Prior Function            PT Goals (current goals can now be found in the care plan section) Acute Rehab PT  Goals Patient Stated Goal: decrease pain Progress towards PT goals: Progressing toward goals    Frequency  Min 5X/week    PT Plan Current plan remains appropriate    Co-evaluation             End of Session Equipment Utilized During Treatment: Gait belt;Cervical collar Activity Tolerance: Patient limited by pain Patient left: in chair;with call bell/phone within reach (interpreter in room)     Time:  [id number] PT Time Calculation (min) (ACUTE ONLY): 25 min  Charges:  $Gait Training: 23-37 mins                    G Codes:      Reginia Naas 11/02/2015, 10:52 AM  Magda Kiel, [place] 02-Nov-2015

## 2015-10-09 NOTE — Progress Notes (Signed)
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  Now s/p decompressive cervical laminectomy C3, C4, C5, and C6. With foraminotomies of the C4, C5, C6 nerve roots, lateral mass fixation utilizing the globus ellipse lateral mass screw system from C3-C7, and posterior lateral arthrodesis C3-C7 utilizing locally harvested autograft mixed with DBX and BMP.   OT comments  Pt making progress toward OT goals this session but continues to c/o 10/10 pain despite premedication. Pt able to perform toilet transfer and grooming activity standing at the sink with min guard assist. Reviewed cervical precautions with pt. L resting hand splint present in room and donned at end of session. D/c plan remains appropriate. Will continue to follow acutely.   Follow Up Recommendations  Home health OT;Supervision/Assistance - 24 hour    Equipment Recommendations  None recommended by OT    Recommendations for Other Services      Precautions / Restrictions Precautions Precautions: Cervical;Fall Precaution Comments: Reviewed cervical precautions. Required Braces or Orthoses: Cervical Brace;Other Brace/Splint Cervical Brace: Hard collar Other Brace/Splint: L resting hand splint donned at end of session Restrictions Weight Bearing Restrictions: No       Mobility Bed Mobility               General bed mobility comments: Pt witting EOB upon arrival.  Transfers Overall transfer level: Needs assistance Equipment used: Quad cane Transfers: Sit to/from Stand Sit to Stand: Min guard         General transfer comment: Min guard for  safety with sit to stand from EOB x1, toilet x1.     Balance Overall balance assessment: Needs assistance Sitting-balance support: Feet supported;No upper extremity supported Sitting balance-Leahy Scale: Good     Standing balance support: Single extremity supported Standing balance-Leahy Scale: Poor                     ADL Overall ADL's : Needs assistance/impaired     Grooming: Min guard;Standing;Wash/dry Geophysical data processor Transfer: Min guard;Ambulation;Regular Toilet (quad cane)   Toileting- Clothing Manipulation and Hygiene: Supervision/safety;Sitting/lateral lean Toileting - Clothing Manipulation Details (indicate cue type and reason): for toilet hygiene only     Functional mobility during ADLs: Min guard;Cane General ADL Comments: Reviewed cervical precautions with pt during functional activities. L resting hand splint presenting in room; performed PROM L digits and donned splint at end of session.      Vision                     Perception     Praxis      Cognition   Behavior During Therapy: [facility] for tasks assessed/performed Overall Cognitive Status: Within Functional Limits for tasks assessed                       Extremity/Trunk Assessment               Exercises     Shoulder Instructions       General Comments      Pertinent Vitals/ Pain       Pain Assessment:  0-10 Pain Score: 10-Worst pain ever Pain Location: neck Pain Descriptors / Indicators: Aching Pain Intervention(s): Monitored during session;Premedicated before session;Repositioned  Home Living                                          Prior Functioning/Environment              Frequency Min 3X/week     Progress Toward Goals  OT Goals(current goals can now be found in the care plan section)  Progress towards OT goals: Progressing toward goals  Acute Rehab OT Goals Patient Stated Goal: decrease pain OT Goal  Formulation: With patient  Plan Discharge plan remains appropriate;Equipment recommendations need to be updated    Co-evaluation                 End of Session Equipment Utilized During Treatment: Gait belt;Cervical collar;Other (comment) (quad cane)   Activity Tolerance Patient tolerated treatment well   Patient Left in chair;with call bell/phone within reach;Other (comment) (with PT and interpreter)   Nurse Communication Mobility status        Time: [phone] OT Time Calculation (min): 24 min  Charges: OT General Charges $OT Visit: 1 Procedure OT Treatments $Self Care/Home Management : 23-37 mins  Binnie Kand M.S., OTR/L Pager: [phone]  10/09/2015, 9:33 AM

## 2015-10-10 NOTE — Progress Notes (Signed)
Patient ID: Charles [name], male   DOB: May 01, 1955, 59 y.o.   MRN: [id number] Spoke thru a interpreter. No pain. No complains. Awaiting for placement

## 2015-10-10 NOTE — Progress Notes (Signed)
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  Now s/p decompressive cervical laminectomy C3, C4, C5, and C6. With foraminotomies of the C4, C5, C6 nerve roots, lateral mass fixation utilizing the globus ellipse lateral mass screw system from C3-C7, and posterior lateral arthrodesis C3-C7 utilizing locally harvested autograft mixed with DBX and BMP.   OT comments  Pt progressing well towards OT goals and is close to his baseline functioning. Pt reported that his brother will be able to assist him with all ADLs so that he can adhere to all cerivcal precautions and maintain safety at all times and his sister-in-law can provide 24/7 supervision. Will continue to follow acutely to address OT needs and goals.   Follow Up Recommendations  Home health OT;Supervision/Assistance - 24 hour    Equipment Recommendations  None recommended by OT    Recommendations for Other Services      Precautions / Restrictions Precautions Precautions: Fall;Cervical Required Braces or Orthoses: Cervical Brace;Other Brace/Splint Cervical Brace: Hard collar Restrictions Weight Bearing Restrictions: No       Mobility Bed Mobility               General bed mobility comments: Pt up in chair on OT arrival  Transfers Overall transfer level: Needs assistance Equipment used: Quad cane Transfers: Sit to/from Stand Sit to Stand: Supervision         General transfer comment: Supervision for safety. No physical assist required.    Balance Overall balance assessment: Needs assistance Sitting-balance support: No upper extremity  supported;Feet supported Sitting balance-Leahy Scale: Good     Standing balance support: Single extremity supported;During functional activity Standing balance-Leahy Scale: Fair                     ADL Overall ADL's : Needs assistance/impaired     Grooming: Wash/dry hands;Min guard;Standing   Upper Body Bathing: Set up;Sitting Upper Body Bathing Details (indicate cue type and reason): pt feels he needs new long-handled sponge Lower Body Bathing: Moderate assistance;Sit to/from stand   Upper Body Dressing : Minimal assistance;Sitting Upper Body Dressing Details (indicate cue type and reason): to don/doff cervical brace Lower Body Dressing: Moderate assistance;Sit to/from stand   Toilet Transfer: Supervision/safety;Ambulation;Regular Toilet (quad cane)   Toileting- Clothing Manipulation and Hygiene: Supervision/safety;Sit to/from stand       Functional mobility during ADLs: Supervision/safety;Cane General ADL Comments: Pt reported that his brother will definitely be able to help him bathe/dress so he does not break any cervical precautions      Vision                     Perception     Praxis      Cognition   Behavior During Therapy: [facility] for tasks assessed/performed Overall Cognitive Status: Within Functional Limits for tasks assessed       Memory: Decreased recall of precautions               Extremity/Trunk Assessment               Exercises     Shoulder Instructions       General Comments      Pertinent Vitals/ Pain  Pain Assessment: 0-10 Pain Score: 8  Pain Location: neck Pain Descriptors / Indicators: Aching Pain Intervention(s): Limited activity within patient's tolerance;Monitored during session;Repositioned  Home Living                                          Prior Functioning/Environment              Frequency Min 3X/week     Progress Toward Goals  OT Goals(current goals can now  be found in the care plan section)  Progress towards OT goals: Progressing toward goals  Acute Rehab OT Goals Patient Stated Goal: decrease pain OT Goal Formulation: With patient Time For Goal Achievement: 10/22/15 Potential to Achieve Goals: Good ADL Goals Pt Will Perform Grooming: with supervision;standing Pt Will Perform Lower Body Bathing: with adaptive equipment;sit to/from stand;with min guard assist Pt Will Perform Lower Body Dressing: sit to/from stand;with min guard assist;with adaptive equipment Pt Will Transfer to Toilet: with min guard assist;ambulating;bedside commode Pt Will Perform Tub/Shower Transfer: Tub transfer;with min guard assist;shower seat;ambulating  Plan Discharge plan remains appropriate;Equipment recommendations need to be updated    Co-evaluation                 End of Session Equipment Utilized During Treatment: Gait belt;Other (comment);Cervical collar (quad cane)   Activity Tolerance Patient tolerated treatment well   Patient Left Other (comment) (with PT)   Nurse Communication Mobility status        Time: [id number] OT Time Calculation (min): 26 min  Charges: OT General Charges $OT Visit: 1 Procedure OT Treatments $Self Care/Home Management : 23-37 mins  Redmond Baseman, OTR/L Pager: [phone] 10/10/2015, 3:15 PM

## 2015-10-10 NOTE — Progress Notes (Signed)
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  Now s/p decompressive cervical laminectomy C3, C4, C5, and C6. With foraminotomies of the C4, C5, C6 nerve roots, lateral mass fixation utilizing the globus ellipse lateral mass screw system from C3-C7, and posterior lateral arthrodesis C3-C7 utilizing locally harvested autograft mixed with DBX and BMP.    PT Comments    Pt continues to make good progress and is close to his baseline mobility. In the home should be able to mobilize on his own. Per the pt his brother is at home some and his sister in law present when brother not home but sister in law doesn't physically assist.  Follow Up Recommendations  Home health PT;Supervision - Intermittent     Equipment Recommendations  None recommended by PT    Recommendations for Other Services       Precautions / Restrictions Precautions Precautions: Fall;Cervical Required Braces or Orthoses: Cervical Brace;Other Brace/Splint Cervical Brace: Hard collar Restrictions Weight Bearing Restrictions: No    Mobility  Bed Mobility Overal bed mobility: Modified Independent Bed Mobility: Sit to Supine       Sit to supine: Modified independent (Device/Increase time)   General bed mobility comments: Incr time  Transfers Overall transfer level: Needs assistance Equipment used: Quad cane Transfers: Sit to/from Stand Sit to Stand: Supervision         General transfer comment: assist for safety  Ambulation/Gait Ambulation/Gait assistance: Supervision Ambulation Distance (Feet): 100 Feet Assistive device: Quad  cane Gait Pattern/deviations: Step-to pattern;Decreased dorsiflexion - left;Steppage;Decreased stance time - left;Wide base of support Gait velocity: slow Gait velocity interpretation: Below normal speed for age/gender General Gait Details: Pt amb in shoes without AFO. Pt with slow gait and abnormal pattern due to long term deficits. No loss of balance.   Stairs            Wheelchair Mobility    Modified Rankin (Stroke Patients Only)       Balance Overall balance assessment: Needs assistance Sitting-balance support: No upper extremity supported;Feet supported Sitting balance-Leahy Scale: Good     Standing balance support: No upper extremity supported Standing balance-Leahy Scale: Fair                      Cognition Arousal/Alertness: Awake/alert Behavior During Therapy: WFL for tasks assessed/performed Overall Cognitive Status: Within Functional Limits for tasks assessed                      Exercises      General Comments General comments (skin integrity, edema, etc.): sign language interpreter present      Pertinent Vitals/Pain Pain Assessment: 0-10 Pain Score: 8  Pain Location: neck Pain Descriptors / Indicators: Aching Pain Intervention(s): Limited activity within patient's tolerance;Monitored during session;Repositioned    Home Living                      Prior Function            PT Goals (current goals can now be found in the care plan section) Acute Rehab PT Goals PT Goal Formulation: With patient Time For Goal Achievement: 10/19/15 Potential to Achieve Goals: Good Progress towards  PT goals: Goals met and updated - see care plan    Frequency  Min 5X/week    PT Plan Current plan remains appropriate    Co-evaluation             End of Session Equipment Utilized During Treatment: Gait belt;Cervical collar Activity Tolerance: Patient tolerated treatment well Patient left: in bed;with call bell/phone within  reach;with bed alarm set     Time: 3795-5831 PT Time Calculation (min) (ACUTE ONLY): 14 min  Charges:  $Gait Training: 8-22 mins                    G Codes:      Jennavie Martinek 10-28-2015, 11:38 AM Suanne Marker PT [phone]

## 2015-10-11 NOTE — Progress Notes (Signed)
Overall stable. No new issues or problems. Pain appears controlled.  Awake and alert. Neurologically stable. Wound clean and dry. Drain output low.  Progressing well following decompressive surgery. Continue rehabilitation efforts.

## 2015-10-11 NOTE — Progress Notes (Signed)
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  Now s/p decompressive cervical laminectomy C3, C4, C5, and C6. With foraminotomies of the C4, C5, C6 nerve roots, lateral mass fixation utilizing the globus ellipse lateral mass screw system from C3-C7, and posterior lateral arthrodesis C3-C7 utilizing locally harvested autograft mixed with DBX and BMP.   OT comments  Pt. States he feels comfortable with how he is moving around the room and his ability to complete ADLS.  Offered opportunities for task completion and pt. politely  Denies need.  States he has all DME/AE with no further questions or concerns.  Will alert OTR/L to sign off.    Follow Up Recommendations       Equipment Recommendations  None recommended by OT    Recommendations for Other Services      Precautions / Restrictions Precautions Precautions: Fall;Cervical Precaution Comments: Reviewed cervical precautions. Required Braces or Orthoses: Cervical Brace;Other Brace/Splint Cervical Brace: Hard collar Restrictions Weight Bearing Restrictions: No       Mobility Bed Mobility Overal bed mobility: Modified Independent Bed Mobility: Supine to Sit     Supine to sit: Modified independent (Device/Increase time)        Transfers Overall transfer level: Modified independent Equipment used: Quad cane Transfers: Sit to/from Stand Sit to Stand: Modified independent (Device/Increase time)              Balance Overall balance assessment: Needs assistance Sitting-balance support: No upper extremity supported;Feet supported Sitting  balance-Leahy Scale: Good     Standing balance support: No upper extremity supported;During functional activity Standing balance-Leahy Scale: Fair                     ADL Overall ADL's : Needs assistance/impaired Eating/Feeding: Set up;Sitting                     Lower Body Dressing Details (indicate cue type and reason): provided LH shoe horn Toilet Transfer: Supervision/safety;Ambulation Toilet Transfer Details (indicate cue type and reason): simulated during ambulation in room Toileting- Clothing Manipulation and Hygiene: Supervision/safety;Sit to/from stand Toileting - Clothing Manipulation Details (indicate cue type and reason): simulated during ambulation in room transfer from eob to recliner     Functional mobility during ADLs: Supervision/safety;Cane General ADL Comments: pt. seen with interpreter present. denies need for any physical review of toileting or tub transfers.  also declines need for UB/LB B/D.  pt. states he has all needed DME/AE and feels comfortable with how he is moving around.  denies any other questions or concerns.  agreeable to sign off from OT.       Vision                     Perception     Praxis      Cognition   Behavior During Therapy: [facility] for tasks assessed/performed Overall Cognitive Status: Within Functional Limits for tasks assessed                       Extremity/Trunk Assessment               Exercises     Shoulder Instructions  General Comments      Pertinent Vitals/ Pain       Pain Assessment: 0-10 Pain Score: 10-Worst pain ever Pain Location: neck Pain Descriptors / Indicators: Aching Pain Intervention(s): Limited activity within patient's tolerance  Home Living                                          Prior Functioning/Environment              Frequency       Progress Toward Goals  OT Goals(current goals can now be found in the care plan  section)  Progress towards OT goals: Goals met/education completed, patient discharged from OT     Plan Discharge plan remains appropriate    Co-evaluation                 End of Session Equipment Utilized During Treatment: Other (comment) (cervical collar)   Activity Tolerance     Patient Left     Nurse Communication          Time: 0855-0913 OT Time Calculation (min): 18 min  Charges: OT General Charges $OT Visit: 1 Procedure OT Treatments $Self Care/Home Management : 8-22 mins  ,  Lorraine, COTA/L 10/11/2015, 9:53 AM    

## 2015-10-11 NOTE — Progress Notes (Signed)
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  Now s/p decompressive cervical laminectomy C3, C4, C5, and C6. With foraminotomies of the C4, C5, C6 nerve roots, lateral mass fixation utilizing the globus ellipse lateral mass screw system from C3-C7, and posterior lateral arthrodesis C3-C7 utilizing locally harvested autograft mixed with DBX and BMP.    PT Comments    Pt continues to do well. Pt comfortable with mobility and feels he can manage his needs at home. Pt can convey in depth how his home set up works for him.   Follow Up Recommendations  Home health PT;Supervision - Intermittent     Equipment Recommendations  None recommended by PT    Recommendations for Other Services       Precautions / Restrictions Precautions Precautions: Fall;Cervical Required Braces or Orthoses: Cervical Brace;Other Brace/Splint Cervical Brace: Hard collar Restrictions Weight Bearing Restrictions: No    Mobility  Bed Mobility Overal bed mobility: Modified Independent Bed Mobility: Supine to Sit     Supine to sit: Modified independent (Device/Increase time)        Transfers Overall transfer level: Modified independent Equipment used: Quad cane Transfers: Sit to/from Stand Sit to Stand: Modified independent (Device/Increase time)            Ambulation/Gait Ambulation/Gait assistance: Supervision Ambulation Distance (Feet): 115 Feet Assistive device: Quad cane Gait Pattern/deviations: Step-to pattern;Decreased dorsiflexion - left;Decreased stance time - left;Wide base of support Gait velocity: slow Gait  velocity interpretation: Below normal speed for age/gender General Gait Details: Pt amb to bathroom in socks only due to urgency without difficulty. Put shoes on with lt AFO prior to further amb. Pt with one episode of dizziness during amb and was able to stop and stabilize himself with wall. Dizziness likely due to recent pain meds. Pt with slow gait and abnormal pattern due to long term deficits.    Stairs            Wheelchair Mobility    Modified Rankin (Stroke Patients Only)       Balance Overall balance assessment: Needs assistance Sitting-balance support: No upper extremity supported;Feet supported Sitting balance-Leahy Scale: Good     Standing balance support: No upper extremity supported;During functional activity Standing balance-Leahy Scale: Fair                      Cognition Arousal/Alertness: Awake/alert Behavior During Therapy: WFL for tasks assessed/performed Overall Cognitive Status: Within Functional Limits for tasks assessed                      Exercises      General Comments        Pertinent Vitals/Pain Pain Assessment: 0-10 Pain Score: 8  Pain Location: neck Pain Descriptors / Indicators: Aching Pain Intervention(s): Limited activity within patient's tolerance;Monitored during session;Premedicated before session    Home Living                      Prior Function            PT Goals (current goals can now be found in the care plan section) Progress towards PT goals: Progressing toward goals  Frequency  Min 5X/week    PT Plan Current plan remains appropriate    Co-evaluation             End of Session Equipment Utilized During Treatment: Cervical collar Activity Tolerance: Patient tolerated treatment well Patient left: in bed;Other (comment) (sitting EOB with OT in room and interpreter in room)     Time: [id number] PT Time Calculation (min) (ACUTE ONLY): 24 min  Charges:  $Gait Training: 8-22  mins                    G Codes:      Hennesy Sobalvarro 11/07/2015, 9:18 AM [facility] PT [phone]

## 2015-10-12 NOTE — Progress Notes (Signed)
Patient refused 10 pm medication. Gave pain pain medication for complaints of neck pain. Preceptor Intha RN notified of patients refusal of medication. Medication returned to pyxis. Hermina Barters Rn,BSN 10/12/2015 11:51 PM

## 2015-10-12 NOTE — Clinical Social Work Note (Signed)
CSW received consults on 8/16 and 8/21 for SNF placement. Reviewed chart, including PT/OT evaluations and occupational therapy recommending no OT follow-up and physical therapy recommending home health and intermittent supervision. Patient does not appear to need SNF placement for rehab. CSW informed by nurse case manager that family interested in placement, however this would be custodial care (long-term care) not rehab, which is private pay or Medicaid.  CSW will continue to follow.  Jamaris Biernat Givens, MSW, LCSW Licensed Clinical Social Worker [place] [phone]

## 2015-10-12 NOTE — Care Management Important Message (Signed)
Important Message  Patient Details  Name: Charles [name] MRN: [id number] Date of Birth: [birth year]   Medicare Important Message Given:  Yes    Orbie Pyo 10/12/2015, 11:56 AM

## 2015-10-12 NOTE — Progress Notes (Signed)
Met w pt, brother Charles [name] and mother. Pt lives w mother most of time but stays w Charles [name] some also. They were hoping for snf and sw Charles [name] aware. phy there and occup there rec hhc. Mother will use [facility] for [facility]hc if goes home. Mother states they will do whatever dr cram recommends. They await him to come by. sw Charles [name] says that with phy there rec hhc that medicare will not pay for snf. Mother states pt has medicaid. sw to meed w fam and pt. hhc has been arranged w Charles [name] w [facility] if goes home and sw to speak w fam. Await md final decision.

## 2015-10-12 NOTE — Progress Notes (Signed)
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  Now s/p decompressive cervical laminectomy C3, C4, C5, and C6. With foraminotomies of the C4, C5, C6 nerve roots, lateral mass fixation utilizing the globus ellipse lateral mass screw system from C3-C7, and posterior lateral arthrodesis C3-C7 utilizing locally harvested autograft mixed with DBX and BMP.    PT Comments    Patient progressing with independence with mobility.  Feel improved stiffness in L hip after stretching.  Patient seems reluctant to walk when he can use wheelchair at home due to fear of falling.  Feel he could be safe for d/c home with family support.  Main limitations are due to limited cervical mobility and unable to perform ADL's as prior due to limited vision with cervical collar.  PT to follow acutely.   Follow Up Recommendations  Home health PT;Supervision - Intermittent     Equipment Recommendations  None recommended by PT    Recommendations for Other Services       Precautions / Restrictions Precautions Precautions: Fall;Cervical Required Braces or Orthoses: Cervical Brace;Other Brace/Splint Cervical Brace: Hard collar Restrictions Weight Bearing Restrictions: No    Mobility  Bed Mobility     Rolling: Min guard Sidelying to sit: Min guard     Sit to sidelying: Min assist General bed mobility comments: assist for technique to side then supine, assist for rolling to R  Transfers   Equipment used: Quad cane   Sit to Stand: Modified independent (Device/Increase time)            Ambulation/Gait Ambulation/Gait assistance:  Supervision Ambulation Distance (Feet): 100 Feet Assistive device: Quad cane Gait Pattern/deviations: Step-to pattern;Wide base of support;Decreased stance time - left;Decreased dorsiflexion - left Gait velocity: 0.18 ft/sec   General Gait Details: limited due to L hemiplegia, had shoes on already so walked with L metal upright AFO   Stairs            Wheelchair Mobility    Modified Rankin (Stroke Patients Only)       Balance             Standing balance-Leahy Scale: Fair Standing balance comment: standing to toilet with S only                    Cognition Arousal/Alertness: Awake/alert Behavior During Therapy: WFL for tasks assessed/performed Overall Cognitive Status: Within Functional Limits for tasks assessed                      Exercises Other Exercises Other Exercises: performed passive stretching in supine to L hamstrings, hip rotators, and ankle, and L UE wrist and hand, elbow and shoulder with manual pressure in L pectorals.  2-3 reps each with prolonged hold Other Exercises: Also performed tone reduction techniques with low trunk rotation    General Comments General comments (skin integrity, edema, etc.): interpreter present throughout session      Pertinent Vitals/Pain Pain Assessment: Faces Faces Pain Scale: Hurts even more Pain Location: neck and L hip with walking Pain Descriptors / Indicators: Aching;Discomfort;Sore Pain Intervention(s): Monitored during session;Repositioned;Other (comment) (stretching)    Home Living  Prior Function            PT Goals (current goals can now be found in the care plan section)      Frequency  Min 5X/week    PT Plan Current plan remains appropriate    Co-evaluation             End of Session Equipment Utilized During Treatment: Cervical collar Activity Tolerance: Patient tolerated treatment well Patient left: in chair;with call bell/phone within  reach;with chair alarm set     Time: [id number] PT Time Calculation (min) (ACUTE ONLY): 46 min  Charges:  $Gait Training: 23-37 mins $Therapeutic Exercise: 8-22 mins                    G Codes:      Reginia Naas November 08, 2015, 11:07 AM  Magda Kiel, [place] 11/08/2015

## 2015-10-12 NOTE — Progress Notes (Signed)
Patient ID: Charles [name], male   DOB: 03/20/1955, 60 y.o.   MRN: [id number] [name] is doing better [place] pain level is down of the 7 although was pretty severe over the weekend  Neurologically stable incision clean dry and intact  We'll discharge with home health physical therapy the family has requested the home health nurse come by twice a week physical therapy every other day and occupational therapy also every other day on the same day 1 in the morning 1 in the afternoon

## 2015-10-13 MED ORDER — TRAMADOL HCL 50 MG PO TABS: 50.0000 mg | ORAL_TABLET | Freq: Three times a day (TID) | ORAL | 0 refills | Status: DC | PRN

## 2015-10-13 MED ORDER — HYDROCODONE-ACETAMINOPHEN 10-325 MG PO TABS: 2.0000 | ORAL_TABLET | ORAL | 0 refills | Status: DC | PRN

## 2015-10-13 NOTE — Care Management Note (Signed)
Case Management Note  Patient Details  Name: Charles [name] MRN: [id number] Date of Birth: 01/23/1956  Subjective/Objective:                    Action/Plan: Pt discharging home today with family and [facility] HH services. Abby with [facility] notified of the discharge and services. CM met with the patient and his mom and sign language interpretor. Pt has transportation home. Pt and Mom do not feel he needs the aide. Bedside RN updated.   Expected Discharge Date:                  Expected Discharge Plan:  Home w Home Health Services  In-House Referral:  Clinical Social Work  Discharge planning Services  CM Consult  Post Acute Care Choice:    Choice offered to:  Parent, Sibling  DME Arranged:    DME Agency:     HH Arranged:  PT, OT, Nurse's Aide (Pt and mom refusing aide) HH Agency:  [facility]  Status of Service:  Completed, signed off  If discussed at Long Length of Stay Meetings, dates discussed:    Additional Comments:   F , RN 10/13/2015, 8:38 AM  

## 2015-10-13 NOTE — Progress Notes (Signed)
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  Now s/p decompressive cervical laminectomy C3, C4, C5, and C6. With foraminotomies of the C4, C5, C6 nerve roots, lateral mass fixation utilizing the globus ellipse lateral mass screw system from C3-C7, and posterior lateral arthrodesis C3-C7 utilizing locally harvested autograft mixed with DBX and BMP.    PT Comments    Patient with family and prepping for d/c this AM.  Reviewed all precautions with patient and family and discussed follow up [facility] care.  Feel patient safe for d/c home with family support and HHPT.   Follow Up Recommendations  Home health PT;Supervision - Intermittent     Equipment Recommendations  None recommended by PT    Recommendations for Other Services       Precautions / Restrictions Precautions Precautions: Cervical;Fall Precaution Comments: Reviewed cervical precautions with family and issued another handout Required Braces or Orthoses: Cervical Brace Cervical Brace: Hard collar Restrictions Weight Bearing Restrictions: No    Mobility  Bed Mobility               General bed mobility comments: deferred mobility due to d/c imminent and patient feels too full after breakfast to stretch  Transfers                    Ambulation/Gait                 Stairs            Wheelchair Mobility    Modified Rankin (Stroke Patients Only)       Balance                                    Cognition Arousal/Alertness: Awake/alert Behavior During Therapy: WFL for tasks assessed/performed Overall  Cognitive Status: Within Functional Limits for tasks assessed                      Exercises      General Comments General comments (skin integrity, edema, etc.): Interpreter and family present.  Reviewed precautions including issue with lots of w/c mobility (pushing with R arm in chair) and to walk with assist when able.  Patient with question about lifting weights with L arm, reviwed no overhead lifting and no weights until cleared by MD.  Also reviewed with family plans for [facility] services.       Pertinent Vitals/Pain Faces Pain Scale: Hurts little more Pain Location: neck Pain Descriptors / Indicators: Sore Pain Intervention(s): Monitored during session    Home Living                      Prior Function            PT Goals (current goals can now be found in the care plan section) Progress towards PT goals: Progressing toward goals    Frequency  Min 5X/week    PT Plan Current plan remains appropriate    Co-evaluation             End of Session   Activity Tolerance: Patient tolerated treatment well Patient left:  in chair;with family/visitor present     Time: [id number] PT Time Calculation (min) (ACUTE ONLY): 16 min  Charges:  $Self Care/Home Management: 11-09-2022                    G Codes:      Reginia Naas 11-09-2015, 10:24 AM  Magda Kiel, [place] 09-Nov-2015

## 2015-10-13 NOTE — Discharge Summary (Signed)
Physician Discharge Summary  Patient ID: Charles [name] MRN: [id number] DOB/AGE: 61-30-1957 61 y.o.  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 He is done very well he has improved upper extremity feeling sensation and ambulation. He is stable for discharge home with home health physical therapy and home health aide.  Consults: Significant Diagnostic Studies: Treatments: Decompressive cervical laminectomy and fusion Discharge Exam: Blood pressure (!) 145/88, pulse 99, temperature 98.6 F (37 C), temperature source Oral, resp. rate 18, height 5\' 4"  (1.626 m), weight 69.9 kg (154 lb 1.6 oz), SpO2 91 %. Strength out of 5 wound clean dry and intact  Disposition: Home  Discharge Instructions    Face-to-face encounter (required for Medicare/Medicaid patients)    Complete by:  As directed   I Raniya Golembeski P certify that this patient is under my care and that I, or a nurse practitioner or physician's assistant working with me, had a face-to-face encounter that meets the physician face-to-face encounter requirements with this patient on 10/13/2015. 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   I certify that, based on my findings,  the following services are medically necessary home health services:   Nursing Physical therapy     Reason for Medically Necessary Home Health Services:  Skilled Nursing- Assessment and Observation of Wound Status   My clinical findings support the need for the above services:  Pain interferes with ambulation/mobility   Further, I certify that my clinical findings support that this patient is homebound due to:  Pain interferes with ambulation/mobility   Home Health    Complete by:  As directed   To provide the following care/treatments:   PT OT Home Health Aide         Medication List    STOP taking these medications   ALEVE 220 MG tablet Generic drug:  naproxen sodium   meclizine 25 MG tablet Commonly known as:  ANTIVERT     TAKE these medications   acetaminophen 650 MG CR tablet Commonly known as:  TYLENOL Take 650 mg by mouth every 8 (eight) hours as needed for pain.   aspirin 325 MG EC tablet Take 325 mg by mouth daily.   atorvastatin 10 MG tablet Commonly known as:  LIPITOR Take 10 mg by mouth daily. (cholesterol)   BENICAR 20 MG tablet Generic drug:  olmesartan take 1 tablet by mouth once daily   BREO ELLIPTA 100-25 MCG/INH Aepb Generic drug:  fluticasone furoate-vilanterol Inhale 1 puff into the lungs daily.   budesonide-formoterol 80-4.5 MCG/ACT inhaler Commonly known as:  SYMBICORT Inhale 2 puffs into the lungs 2 (two) times daily as needed (shortness of breath/ wheezing).   clopidogrel 75 MG tablet Commonly known as:  PLAVIX Take 75 mg by mouth daily.   esomeprazole 40 MG capsule Commonly known  as:  NEXIUM Take 1 capsule (40 mg total) by mouth 2 (two) times daily.   NEXIUM 40 MG capsule Generic drug:  esomeprazole take 1 capsule by mouth 30 MINUTES PRIOR TO MEALS----TAKE TWICE A DAY   furosemide 20 MG tablet Commonly known as:  LASIX Take 20 mg by mouth daily.   gabapentin 300 MG capsule Commonly known as:  NEURONTIN Take 1 capsule (300 mg  total) by mouth 3 (three) times daily.   HYDROcodone-acetaminophen 10-325 MG tablet Commonly known as:  NORCO Take 2 tablets by mouth every 4 (four) hours as needed for moderate pain.   iron polysaccharides 150 MG capsule Commonly known as:  NIFEREX Take 1 capsule (150 mg total) by mouth daily.   meloxicam 7.5 MG tablet Commonly known as:  MOBIC Take 1 tablet (7.5 mg total) by mouth 2 (two) times daily.   mometasone-formoterol 100-5 MCG/ACT Aero Commonly known as:  DULERA Take 2 puffs first thing in am and then another 2 puffs about 12 hours later. What changed:  how much to take  how to take this  when to take this  additional instructions   naproxen 500 MG tablet Commonly known as:  NAPROSYN Take 500 mg by mouth 2 (two) times daily with a meal.   oxybutynin 15 MG 24 hr tablet Commonly known as:  DITROPAN XL Take 15 mg by mouth at bedtime.   polyethylene glycol powder powder Commonly known as:  GLYCOLAX/MIRALAX Take 17 g by mouth at bedtime as needed for mild constipation. Mix in 8 oz liquid and drink   predniSONE 1 MG tablet Commonly known as:  DELTASONE Take 1 mg by mouth daily with breakfast.   sitaGLIPtin-metformin 50-500 MG tablet Commonly known as:  JANUMET Take 1 tablet by mouth 2 (two) times daily with a meal.   traMADol 50 MG tablet Commonly known as:  ULTRAM Take 1 tablet (50 mg total) by mouth every 8 (eight) hours as needed. What changed:  reasons to take this   traMADol 50 MG tablet Commonly known as:  ULTRAM Take 1 tablet (50 mg total) by mouth every 8 (eight) hours as needed (pain). What changed:  You were already taking a medication with the same name, and this prescription was added. Make sure you understand how and when to take each.      Follow-up Information    Hedi Barkan P, MD Follow up in 3 day(s).   Specialty:  Neurosurgery Contact information: [address] [place] [address] [phone]         Elaina Hoops, MD .   Specialty:  Neurosurgery Contact information: [address] [address] [place] [address] [phone]           Signed: Elaina Hoops 10/13/2015, 8:09 AM

## 2015-10-13 NOTE — Discharge Instructions (Signed)
Anterior Cervical Fusion, Care After Refer to this sheet in the next few weeks. These instructions provide you with information on caring for yourself after your procedure. Your health care provider may also give you more specific instructions. Your treatment has been planned according to current medical practices, but problems sometimes occur. Call your health care provider if you have any problems or questions after your procedure. WHAT TO EXPECT AFTER THE PROCEDURE After your procedure, it is typical to have the following:   Pain.  Numbness.  Weakness. HOME CARE INSTRUCTIONS  It can take 6-12 months to recover fully from this procedure. Make sure to follow all of your health care provider's instructions carefully.   Only take medicines as directed by your health care provider.  You can eat what you usually do.  Your activities will be limited for the first 3-4 months. In general:  It is fine to walk and climb stairs.  Do not lift anything weighing more than 10 lb (4.5 kg).  Do not lift objects over your head.  Do not drive until your health care provider says it is okay.  Take showers instead of baths until directed by your health care provider.  If you have to wear a cervical collar, take it off only as directed by your health care provider. You may be able to take it off to eat and shower.  Change the bandage as directed by your health care provider.  Return to your health care provider to have sutures or staples removed as directed by your health care provider.  Follow your health care provider's instructions for physical therapy.  You may apply ice to the repaired area:   Put ice in a plastic bag.  Place a towel between your skin and the bag.  Leave the ice on for 20 minutes, 2-3 times a day.   Keep all follow-up appointments. SEEK MEDICAL CARE IF:  You have redness, swelling, or pain in your cut (incision) that is getting worse.  You have pus coming from  the incision.  You have a fever.  There is a bad smell coming from the incision or bandage.  The edges of the incision break open after sutures or staples are taken out.  Your pain medicine is not helping.  You have swelling in your calf or leg.  You seem to be getting worse instead of better.  SEEK IMMEDIATE MEDICAL CARE IF:  You develop shortness of breath or chest pain.  You have trouble swallowing.  You have pain, numbness, or weakness that is getting worse.   This information is not intended to replace advice given to you by your health care provider. Make sure you discuss any questions you have with your health care provider.   Document Released: 09/22/2003 Document Revised: 02/12/2013 Document Reviewed: 12/04/2012 Elsevier Interactive Patient Education 2016 [employer] No lifting no bending no twisting no riding a car unless he is calm back and forth to see me.

## 2015-10-13 NOTE — Progress Notes (Signed)
Pt d/c to home by car with family. Assessment stable. Prescriptions given. All questions

## 2015-10-13 NOTE — Progress Notes (Signed)
Patient ID: Charles [name], male   DOB: September 10, 1955, 60 y.o.   MRN: [id number] Doing well still condition neck pain discharged home

## 2015-10-16 DIAGNOSIS — Z4789 Encounter for other orthopedic aftercare: Secondary | ICD-10-CM | POA: Diagnosis not present

## 2015-10-16 DIAGNOSIS — H913 Deaf nonspeaking, not elsewhere classified: Secondary | ICD-10-CM | POA: Diagnosis not present

## 2015-10-16 DIAGNOSIS — Z7984 Long term (current) use of oral hypoglycemic drugs: Secondary | ICD-10-CM | POA: Diagnosis not present

## 2015-10-16 DIAGNOSIS — M6281 Muscle weakness (generalized): Secondary | ICD-10-CM | POA: Diagnosis not present

## 2015-10-16 DIAGNOSIS — J449 Chronic obstructive pulmonary disease, unspecified: Secondary | ICD-10-CM | POA: Diagnosis not present

## 2015-10-16 DIAGNOSIS — D869 Sarcoidosis, unspecified: Secondary | ICD-10-CM | POA: Diagnosis not present

## 2015-10-16 DIAGNOSIS — I69354 Hemiplegia and hemiparesis following cerebral infarction affecting left non-dominant side: Secondary | ICD-10-CM | POA: Diagnosis not present

## 2015-10-16 DIAGNOSIS — Z9181 History of falling: Secondary | ICD-10-CM | POA: Diagnosis not present

## 2015-10-16 DIAGNOSIS — E119 Type 2 diabetes mellitus without complications: Secondary | ICD-10-CM | POA: Diagnosis not present

## 2015-10-16 DIAGNOSIS — Z87891 Personal history of nicotine dependence: Secondary | ICD-10-CM | POA: Diagnosis not present

## 2015-10-16 DIAGNOSIS — Z7902 Long term (current) use of antithrombotics/antiplatelets: Secondary | ICD-10-CM | POA: Diagnosis not present

## 2015-10-16 DIAGNOSIS — Z7982 Long term (current) use of aspirin: Secondary | ICD-10-CM | POA: Diagnosis not present

## 2015-10-20 DIAGNOSIS — Z4789 Encounter for other orthopedic aftercare: Secondary | ICD-10-CM | POA: Diagnosis not present

## 2015-10-20 DIAGNOSIS — H913 Deaf nonspeaking, not elsewhere classified: Secondary | ICD-10-CM | POA: Diagnosis not present

## 2015-10-20 DIAGNOSIS — I10 Essential (primary) hypertension: Secondary | ICD-10-CM | POA: Diagnosis not present

## 2015-10-20 DIAGNOSIS — E119 Type 2 diabetes mellitus without complications: Secondary | ICD-10-CM | POA: Diagnosis not present

## 2015-10-20 DIAGNOSIS — Z6826 Body mass index (BMI) 26.0-26.9, adult: Secondary | ICD-10-CM | POA: Diagnosis not present

## 2015-10-20 DIAGNOSIS — M542 Cervicalgia: Secondary | ICD-10-CM | POA: Diagnosis not present

## 2015-10-20 DIAGNOSIS — J449 Chronic obstructive pulmonary disease, unspecified: Secondary | ICD-10-CM | POA: Diagnosis not present

## 2015-10-20 DIAGNOSIS — G959 Disease of spinal cord, unspecified: Secondary | ICD-10-CM | POA: Diagnosis not present

## 2015-10-20 DIAGNOSIS — M6281 Muscle weakness (generalized): Secondary | ICD-10-CM | POA: Diagnosis not present

## 2015-10-20 DIAGNOSIS — I69354 Hemiplegia and hemiparesis following cerebral infarction affecting left non-dominant side: Secondary | ICD-10-CM | POA: Diagnosis not present

## 2015-10-21 DIAGNOSIS — Z4789 Encounter for other orthopedic aftercare: Secondary | ICD-10-CM | POA: Diagnosis not present

## 2015-10-21 DIAGNOSIS — H913 Deaf nonspeaking, not elsewhere classified: Secondary | ICD-10-CM | POA: Diagnosis not present

## 2015-10-21 DIAGNOSIS — M6281 Muscle weakness (generalized): Secondary | ICD-10-CM | POA: Diagnosis not present

## 2015-10-21 DIAGNOSIS — I69354 Hemiplegia and hemiparesis following cerebral infarction affecting left non-dominant side: Secondary | ICD-10-CM | POA: Diagnosis not present

## 2015-10-21 DIAGNOSIS — J449 Chronic obstructive pulmonary disease, unspecified: Secondary | ICD-10-CM | POA: Diagnosis not present

## 2015-10-21 DIAGNOSIS — E119 Type 2 diabetes mellitus without complications: Secondary | ICD-10-CM | POA: Diagnosis not present

## 2015-10-22 DIAGNOSIS — I69354 Hemiplegia and hemiparesis following cerebral infarction affecting left non-dominant side: Secondary | ICD-10-CM | POA: Diagnosis not present

## 2015-10-22 DIAGNOSIS — Z4789 Encounter for other orthopedic aftercare: Secondary | ICD-10-CM | POA: Diagnosis not present

## 2015-10-22 DIAGNOSIS — H913 Deaf nonspeaking, not elsewhere classified: Secondary | ICD-10-CM | POA: Diagnosis not present

## 2015-10-22 DIAGNOSIS — M6281 Muscle weakness (generalized): Secondary | ICD-10-CM | POA: Diagnosis not present

## 2015-10-22 DIAGNOSIS — E119 Type 2 diabetes mellitus without complications: Secondary | ICD-10-CM | POA: Diagnosis not present

## 2015-10-22 DIAGNOSIS — J449 Chronic obstructive pulmonary disease, unspecified: Secondary | ICD-10-CM | POA: Diagnosis not present

## 2015-10-23 ENCOUNTER — Encounter (HOSPITAL_COMMUNITY): Payer: Medicare Other | Attending: Hematology & Oncology

## 2015-10-23 DIAGNOSIS — I69354 Hemiplegia and hemiparesis following cerebral infarction affecting left non-dominant side: Secondary | ICD-10-CM | POA: Diagnosis not present

## 2015-10-23 DIAGNOSIS — Z4789 Encounter for other orthopedic aftercare: Secondary | ICD-10-CM | POA: Diagnosis not present

## 2015-10-23 DIAGNOSIS — D509 Iron deficiency anemia, unspecified: Secondary | ICD-10-CM

## 2015-10-23 DIAGNOSIS — H913 Deaf nonspeaking, not elsewhere classified: Secondary | ICD-10-CM | POA: Diagnosis not present

## 2015-10-23 DIAGNOSIS — E119 Type 2 diabetes mellitus without complications: Secondary | ICD-10-CM | POA: Diagnosis not present

## 2015-10-23 DIAGNOSIS — J449 Chronic obstructive pulmonary disease, unspecified: Secondary | ICD-10-CM | POA: Diagnosis not present

## 2015-10-23 DIAGNOSIS — M6281 Muscle weakness (generalized): Secondary | ICD-10-CM | POA: Diagnosis not present

## 2015-10-23 LAB — CBC WITH DIFFERENTIAL/PLATELET
BASOS PCT: 0 %
Basophils Absolute: 0 10*3/uL (ref 0.0–0.1)
EOS ABS: 0.1 10*3/uL (ref 0.0–0.7)
Eosinophils Relative: 3 %
HEMATOCRIT: 35.8 % — AB (ref 39.0–52.0)
Hemoglobin: 12.4 g/dL — ABNORMAL LOW (ref 13.0–17.0)
Lymphocytes Relative: 21 %
Lymphs Abs: 1 10*3/uL (ref 0.7–4.0)
MCH: 30.5 pg (ref 26.0–34.0)
MCHC: 34.6 g/dL (ref 30.0–36.0)
MCV: 88.2 fL (ref 78.0–100.0)
MONO ABS: 0.5 10*3/uL (ref 0.1–1.0)
MONOS PCT: 10 %
Neutro Abs: 3.2 10*3/uL (ref 1.7–7.7)
Neutrophils Relative %: 66 %
Platelets: 172 10*3/uL (ref 150–400)
RBC: 4.06 MIL/uL — ABNORMAL LOW (ref 4.22–5.81)
RDW: 12.1 % (ref 11.5–15.5)
WBC: 4.9 10*3/uL (ref 4.0–10.5)

## 2015-10-27 ENCOUNTER — Other Ambulatory Visit: Payer: Self-pay | Admitting: Neurosurgery

## 2015-10-27 DIAGNOSIS — I69354 Hemiplegia and hemiparesis following cerebral infarction affecting left non-dominant side: Secondary | ICD-10-CM | POA: Diagnosis not present

## 2015-10-27 DIAGNOSIS — J449 Chronic obstructive pulmonary disease, unspecified: Secondary | ICD-10-CM | POA: Diagnosis not present

## 2015-10-27 DIAGNOSIS — Z4789 Encounter for other orthopedic aftercare: Secondary | ICD-10-CM | POA: Diagnosis not present

## 2015-10-27 DIAGNOSIS — M6281 Muscle weakness (generalized): Secondary | ICD-10-CM | POA: Diagnosis not present

## 2015-10-27 DIAGNOSIS — H913 Deaf nonspeaking, not elsewhere classified: Secondary | ICD-10-CM | POA: Diagnosis not present

## 2015-10-27 DIAGNOSIS — G959 Disease of spinal cord, unspecified: Secondary | ICD-10-CM

## 2015-10-27 DIAGNOSIS — E119 Type 2 diabetes mellitus without complications: Secondary | ICD-10-CM | POA: Diagnosis not present

## 2015-10-28 ENCOUNTER — Ambulatory Visit
Admission: RE | Admit: 2015-10-28 | Discharge: 2015-10-28 | Disposition: A | Discharge status: Home / Self Care | Payer: Medicare Other | Source: Ambulatory Visit | Attending: Neurosurgery | Admitting: Neurosurgery

## 2015-10-28 DIAGNOSIS — G959 Disease of spinal cord, unspecified: Secondary | ICD-10-CM

## 2015-10-28 DIAGNOSIS — M4322 Fusion of spine, cervical region: Secondary | ICD-10-CM | POA: Diagnosis not present

## 2015-10-28 MED ORDER — GADOBENATE DIMEGLUMINE 529 MG/ML IV SOLN
14.0000 mL | Freq: Once | INTRAVENOUS | Status: AC | PRN
  Administered 2015-10-28: 14 mL via INTRAVENOUS

## 2015-10-29 DIAGNOSIS — Z4789 Encounter for other orthopedic aftercare: Secondary | ICD-10-CM | POA: Diagnosis not present

## 2015-10-29 DIAGNOSIS — H913 Deaf nonspeaking, not elsewhere classified: Secondary | ICD-10-CM | POA: Diagnosis not present

## 2015-10-29 DIAGNOSIS — I69354 Hemiplegia and hemiparesis following cerebral infarction affecting left non-dominant side: Secondary | ICD-10-CM | POA: Diagnosis not present

## 2015-10-29 DIAGNOSIS — E119 Type 2 diabetes mellitus without complications: Secondary | ICD-10-CM | POA: Diagnosis not present

## 2015-10-29 DIAGNOSIS — J449 Chronic obstructive pulmonary disease, unspecified: Secondary | ICD-10-CM | POA: Diagnosis not present

## 2015-10-29 DIAGNOSIS — M6281 Muscle weakness (generalized): Secondary | ICD-10-CM | POA: Diagnosis not present

## 2015-11-02 DIAGNOSIS — M6281 Muscle weakness (generalized): Secondary | ICD-10-CM | POA: Diagnosis not present

## 2015-11-02 DIAGNOSIS — E119 Type 2 diabetes mellitus without complications: Secondary | ICD-10-CM | POA: Diagnosis not present

## 2015-11-02 DIAGNOSIS — I69354 Hemiplegia and hemiparesis following cerebral infarction affecting left non-dominant side: Secondary | ICD-10-CM | POA: Diagnosis not present

## 2015-11-02 DIAGNOSIS — Z4789 Encounter for other orthopedic aftercare: Secondary | ICD-10-CM | POA: Diagnosis not present

## 2015-11-02 DIAGNOSIS — H913 Deaf nonspeaking, not elsewhere classified: Secondary | ICD-10-CM | POA: Diagnosis not present

## 2015-11-02 DIAGNOSIS — J449 Chronic obstructive pulmonary disease, unspecified: Secondary | ICD-10-CM | POA: Diagnosis not present

## 2015-11-04 DIAGNOSIS — H913 Deaf nonspeaking, not elsewhere classified: Secondary | ICD-10-CM | POA: Diagnosis not present

## 2015-11-04 DIAGNOSIS — J449 Chronic obstructive pulmonary disease, unspecified: Secondary | ICD-10-CM | POA: Diagnosis not present

## 2015-11-04 DIAGNOSIS — Z4789 Encounter for other orthopedic aftercare: Secondary | ICD-10-CM | POA: Diagnosis not present

## 2015-11-04 DIAGNOSIS — I69354 Hemiplegia and hemiparesis following cerebral infarction affecting left non-dominant side: Secondary | ICD-10-CM | POA: Diagnosis not present

## 2015-11-04 DIAGNOSIS — M6281 Muscle weakness (generalized): Secondary | ICD-10-CM | POA: Diagnosis not present

## 2015-11-04 DIAGNOSIS — E119 Type 2 diabetes mellitus without complications: Secondary | ICD-10-CM | POA: Diagnosis not present

## 2015-11-05 DIAGNOSIS — M542 Cervicalgia: Secondary | ICD-10-CM | POA: Diagnosis not present

## 2015-11-05 DIAGNOSIS — I10 Essential (primary) hypertension: Secondary | ICD-10-CM | POA: Diagnosis not present

## 2015-11-05 DIAGNOSIS — Z299 Encounter for prophylactic measures, unspecified: Secondary | ICD-10-CM | POA: Diagnosis not present

## 2015-11-05 DIAGNOSIS — E1165 Type 2 diabetes mellitus with hyperglycemia: Secondary | ICD-10-CM | POA: Diagnosis not present

## 2015-11-05 DIAGNOSIS — Z6824 Body mass index (BMI) 24.0-24.9, adult: Secondary | ICD-10-CM | POA: Diagnosis not present

## 2015-11-06 DIAGNOSIS — Z4789 Encounter for other orthopedic aftercare: Secondary | ICD-10-CM | POA: Diagnosis not present

## 2015-11-06 DIAGNOSIS — E119 Type 2 diabetes mellitus without complications: Secondary | ICD-10-CM | POA: Diagnosis not present

## 2015-11-06 DIAGNOSIS — H913 Deaf nonspeaking, not elsewhere classified: Secondary | ICD-10-CM | POA: Diagnosis not present

## 2015-11-06 DIAGNOSIS — J449 Chronic obstructive pulmonary disease, unspecified: Secondary | ICD-10-CM | POA: Diagnosis not present

## 2015-11-06 DIAGNOSIS — M6281 Muscle weakness (generalized): Secondary | ICD-10-CM | POA: Diagnosis not present

## 2015-11-06 DIAGNOSIS — I69354 Hemiplegia and hemiparesis following cerebral infarction affecting left non-dominant side: Secondary | ICD-10-CM | POA: Diagnosis not present

## 2015-11-09 DIAGNOSIS — M6281 Muscle weakness (generalized): Secondary | ICD-10-CM | POA: Diagnosis not present

## 2015-11-09 DIAGNOSIS — E119 Type 2 diabetes mellitus without complications: Secondary | ICD-10-CM | POA: Diagnosis not present

## 2015-11-09 DIAGNOSIS — H913 Deaf nonspeaking, not elsewhere classified: Secondary | ICD-10-CM | POA: Diagnosis not present

## 2015-11-09 DIAGNOSIS — I69354 Hemiplegia and hemiparesis following cerebral infarction affecting left non-dominant side: Secondary | ICD-10-CM | POA: Diagnosis not present

## 2015-11-09 DIAGNOSIS — Z4789 Encounter for other orthopedic aftercare: Secondary | ICD-10-CM | POA: Diagnosis not present

## 2015-11-09 DIAGNOSIS — J449 Chronic obstructive pulmonary disease, unspecified: Secondary | ICD-10-CM | POA: Diagnosis not present

## 2015-11-11 DIAGNOSIS — I69354 Hemiplegia and hemiparesis following cerebral infarction affecting left non-dominant side: Secondary | ICD-10-CM | POA: Diagnosis not present

## 2015-11-11 DIAGNOSIS — E119 Type 2 diabetes mellitus without complications: Secondary | ICD-10-CM | POA: Diagnosis not present

## 2015-11-11 DIAGNOSIS — J449 Chronic obstructive pulmonary disease, unspecified: Secondary | ICD-10-CM | POA: Diagnosis not present

## 2015-11-11 DIAGNOSIS — M6281 Muscle weakness (generalized): Secondary | ICD-10-CM | POA: Diagnosis not present

## 2015-11-11 DIAGNOSIS — H913 Deaf nonspeaking, not elsewhere classified: Secondary | ICD-10-CM | POA: Diagnosis not present

## 2015-11-11 DIAGNOSIS — Z4789 Encounter for other orthopedic aftercare: Secondary | ICD-10-CM | POA: Diagnosis not present

## 2015-11-12 ENCOUNTER — Encounter: Payer: Self-pay | Admitting: Gastroenterology

## 2015-11-12 ENCOUNTER — Ambulatory Visit (INDEPENDENT_AMBULATORY_CARE_PROVIDER_SITE_OTHER): Payer: Medicare Other | Admitting: Gastroenterology

## 2015-11-12 ENCOUNTER — Telehealth: Payer: Self-pay

## 2015-11-12 DIAGNOSIS — R1310 Dysphagia, unspecified: Secondary | ICD-10-CM | POA: Diagnosis not present

## 2015-11-12 DIAGNOSIS — R634 Abnormal weight loss: Secondary | ICD-10-CM | POA: Diagnosis not present

## 2015-11-12 DIAGNOSIS — I63311 Cerebral infarction due to thrombosis of right middle cerebral artery: Secondary | ICD-10-CM | POA: Diagnosis not present

## 2015-11-12 DIAGNOSIS — R1013 Epigastric pain: Secondary | ICD-10-CM

## 2015-11-12 LAB — TSH: TSH: 2.38 m[IU]/L (ref 0.40–4.50)

## 2015-11-12 NOTE — Progress Notes (Signed)
Subjective:    Patient ID: Charles [name], male    DOB: 18-Aug-1955, 60 y.o.   MRN: [id number]  [name], MD  HPI INFO OBTAINED VIA INTERPRETER: [name]. NAUSEA: SOME. TASTE BAD AND DOESN'T KNOW WHY. HAS TO GO BATHROOM FREQUENTLY TO URINATE & HAVE A BM. STOOL LOOKS NORMAL. DIARRHEA: RARE. APPETITE: EATS GOOD. WEIGHT: 171-176 LBS TO 147 LBS NOT TRYING. NOT HAVING TROUBLE BREATHING. SWALLOWING IS FINE BUT RARELY TROUBLE WITH  FOOD.  PT DENIES FEVER, CHILLS, HEMATOCHEZIA, HEMATEMESIS, nausea, vomiting, melena, diarrhea, CHEST PAIN, SHORTNESS OF BREATH,  CHANGE IN BOWEL IN HABITS, constipation, abdominal pain, problems swallowing, problems with sedation, OR heartburn or indigestion.   Past Medical History:  Diagnosis Date  . Anemia   . Asthma   . Essential hypertension, benign   . GERD (gastroesophageal reflux disease)   . Headache    migraines in the past per pt  . Hearing impaired   . History of stroke    Spastic hemiplegia, right MCA stroke April 2014 in [place]  . Mixed hyperlipidemia   . PAD (peripheral artery disease) ([place])   . Sarcoidosis ([place])   . Stroke [facility])    2014, left sided weakness  . Type 2 diabetes mellitus ([place])     Past Surgical History:  Procedure Laterality Date  . BRAVO PH STUDY N/A 06/18/2013   pH STUDY SHOWS NEXIUM TWICE DAILY CONTROLS THE ACID IN HIS STOMACH. HE HAD VERY FEWEPISODE OF REGURGITATION RECORDED IN THE 2 DAYS THE STUDY WAS PERFORMED.   [name] COLONOSCOPY  2011   [place]  . 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   . ESOPHAGOGASTRODUODENOSCOPY N/A 05/05/2015   Procedure: ESOPHAGOGASTRODUODENOSCOPY (EGD);  Surgeon: Danie Binder, MD;   Location[facility];  Service: Endoscopy;  Laterality: N/A;  730  . MALONEY DILATION N/A 06/18/2013   Procedure: MALONEY DILATION;  Surgeon: Danie Binder, MD;  Location: [facility];  Service: Endoscopy;  Laterality: N/A;  . OTHER SURGICAL HISTORY     cyst removal head and chest  . POSTERIOR CERVICAL FUSION/FORAMINOTOMY N/A 10/07/2015   Procedure: Cervical Three-Four, Cervical Four-Five, Cervical Five-Six, Cervical Six-Seven Posterior Cervical Laminectomy and Fusion;  Surgeon: Kary Kos, MD;  Location: [facility]URO ORS;  Service: Neurosurgery;  Laterality: N/A;  . SAVORY DILATION N/A 06/18/2013   Procedure: SAVORY DILATION;  Surgeon: Danie Binder, MD;  Location: [facility];  Service: Endoscopy;  Laterality: N/A;  . TEE WITHOUT CARDIOVERSION N/A 10/04/2012   Procedure: TRANSESOPHAGEAL ECHOCARDIOGRAM (TEE);  Surgeon: Thayer Headings, MD;  Location: [facility] ENDOSCOPY;  Service: Cardiovascular;  Laterality: N/A;  . [place]  Both legs fractured and repaired surgically    Allergies  Allergen Reactions  . Shellfish Allergy Hives and Swelling    SWELLING REACTION UNSPECIFIED   . Contrast Media [Iodinated Diagnostic Agents] Itching  . Oxycodone Nausea And Vomiting    Current Outpatient Prescriptions  Medication Sig Dispense Refill  . acetaminophen (TYLENOL) 650 MG CR tablet Take 650 mg by mouth every 8 (eight) hours as needed for pain.    [name] aspirin 325 MG EC tablet Take 325 mg by mouth daily.    [name] atorvastatin (LIPITOR) 10 MG tablet Take 10  mg by mouth daily. (cholesterol)    . budesonide-formoterol (SYMBICORT) 80-4.5 MCG/ACT inhaler Inhale 2 puffs into the lungs 2 (two) times daily as needed (shortness of breath/ wheezing).    . clopidogrel (PLAVIX) 75 MG tablet Take 75 mg by mouth daily.    [name] esomeprazole (NEXIUM) 40 MG capsule Take 1 capsule (40 mg total) by mouth 2 (two) times daily.    . fluticasone furoate-vilanterol (BREO ELLIPTA) 100-25 MCG/INH  AEPB Inhale 1 puff into the lungs as needed.     . furosemide (LASIX) 20 MG tablet Take 20 mg by mouth daily.     [name] gabapentin (NEURONTIN) 300 MG capsule Take 1 capsule (300 mg total) by mouth 3 (three) times daily. (Patient taking differently: Take 300 mg by mouth daily. )    . iron polysaccharides (NIFEREX) 150 MG capsule Take 1 capsule (150 mg total) by mouth daily.    . mometasone-formoterol (DULERA) 100-5 MCG/ACT AERO Take 2 puffs first thing in am and then another 2 puffs about 12 hours later. (Patient taking differently: Inhale 2 puffs into the lungs every 12 (twelve) hours. )    . naproxen (NAPROSYN) 500 MG tablet Take 500 mg by mouth 2 (two) times daily with a meal.    . oxybutynin (DITROPAN XL) 15 MG 24 hr tablet Take 15 mg by mouth at bedtime.    . polyethylene glycol powder (GLYCOLAX/MIRALAX) powder Take 17 g by mouth at bedtime as needed for mild constipation. Mix in 8 oz liquid and drink    . predniSONE (DELTASONE) 1 MG tablet Take 1 mg by mouth daily with breakfast.    . sitaGLIPtin-metformin (JANUMET) 50-500 MG tablet Take 1 tablet by mouth 2 (two) times daily with a meal.    .      . traMADol (ULTRAM) 50 MG tablet Take 1 tablet (50 mg total) by mouth every 8 (eight) hours as needed (pain).    .      .      .      .       Review of Systems PER HPI OTHERWISE ALL SYSTEMS ARE NEGATIVE.    Objective:   Physical Exam  Constitutional: He is oriented to person, place, and time. He appears well-developed and well-nourished. No distress.  HENT:  Head: Normocephalic and atraumatic.  Mouth/Throat: Oropharynx is clear and moist. No oropharyngeal exudate.  Eyes: Pupils are equal, round, and reactive to light. No scleral icterus.  Neck: Normal range of motion. Neck supple.  Cardiovascular: Normal rate, regular rhythm and normal heart sounds.   Pulmonary/Chest: Effort normal and breath sounds normal. No respiratory distress.  Abdominal: Soft. Bowel sounds are normal. He exhibits no  distension. There is no tenderness.  Musculoskeletal: He exhibits no edema.  SOFT NECK BRACE IN PLACE  Lymphadenopathy:    He has no cervical adenopathy.  Neurological: He is alert and oriented to person, place, and time.  NO NEW FOCAL DEFICITS  Psychiatric: He has a normal mood and affect.  Vitals reviewed.     Assessment & Plan:

## 2015-11-12 NOTE — Assessment & Plan Note (Signed)
SYMPTOMS FAIRLY WELL CONTROLLED.  Watch food bolus size. Recommend soft mechanical diet. FOLLOW UP IN 4 MOS.

## 2015-11-12 NOTE — Assessment & Plan Note (Signed)
SYMPTOMS CONTROLLED/RESOLVED.  CONTINUE TO MONITOR SYMPTOMS. 

## 2015-11-12 NOTE — Assessment & Plan Note (Addendum)
Unintentional BUT IN SETTING OF RECENT SURGERY. CT SCAN/CXR IN 2017.  CHECK TSH. CONTINUE TO MONITOR YOUR WEIGHT. ADD BOOST, ENSURE, OR CARNATION INSTANT BREAKFAST 3 TIMES DAILY.  FOLLOW UP IN 4 MOS.

## 2015-11-12 NOTE — Telephone Encounter (Signed)
Called Dr. Fredrik Rigger office (urologist) to see if appt could be moved up. Receptionist advised he has an appt 12/04/15 and unable to move up unless they have a cancellation. LMOM to inform pt's mom.

## 2015-11-12 NOTE — Patient Instructions (Signed)
TO PREVENT WEIGHT LOSS, ADD BOOST, ENSURE, OR CARNATION INSTANT BREAKFAST 3 TIMES DAILY.  FOLLOW A SOFT MECHANICAL DIET.  MEATS SHOULD BE CHOPPED OR GROUND ONLY. SEE INFO BELOW.  COMPLETE BLOOD DRAW.  FOLLOW UP IN 4 MOS.   SOFT MECHANICAL DIET This SOFT MECHANICAL DIET is restricted to:  Foods that are moist, soft-textured, and easy to chew and swallow.   Meats that are ground or are minced no larger than one-quarter inch pieces. Meats are moist with gravy or sauce added.   Foods that do not include bread or bread-like textures except soft pancakes, well-moistened with syrup or sauce.   Textures with some chewing ability required.   Casseroles without rice.   Cooked vegetables that are less than half an inch in size and easily mashed with a fork. No cooked corn, peas, broccoli, cauliflower, cabbage, Brussels sprouts, asparagus, or other fibrous, non-tender or rubbery cooked vegetables.   Canned fruit except for pineapple. Fruit must be cut into pieces no larger than half an inch in size.   Foods that do not include nuts, seeds, coconut, or sticky textures.

## 2015-11-12 NOTE — Progress Notes (Signed)
ON RECALL  °

## 2015-11-12 NOTE — Progress Notes (Signed)
CC'ED TO PCP 

## 2015-11-13 ENCOUNTER — Other Ambulatory Visit: Payer: Self-pay | Admitting: Neurosurgery

## 2015-11-13 ENCOUNTER — Telehealth: Payer: Self-pay | Admitting: Gastroenterology

## 2015-11-13 DIAGNOSIS — M79605 Pain in left leg: Secondary | ICD-10-CM

## 2015-11-13 NOTE — Telephone Encounter (Signed)
PLEASE CALL PT. HIS THYROID LEVEL IS NORMAL.  

## 2015-11-16 DIAGNOSIS — H913 Deaf nonspeaking, not elsewhere classified: Secondary | ICD-10-CM | POA: Diagnosis not present

## 2015-11-16 DIAGNOSIS — M6281 Muscle weakness (generalized): Secondary | ICD-10-CM | POA: Diagnosis not present

## 2015-11-16 DIAGNOSIS — E119 Type 2 diabetes mellitus without complications: Secondary | ICD-10-CM | POA: Diagnosis not present

## 2015-11-16 DIAGNOSIS — Z4789 Encounter for other orthopedic aftercare: Secondary | ICD-10-CM | POA: Diagnosis not present

## 2015-11-16 DIAGNOSIS — I69354 Hemiplegia and hemiparesis following cerebral infarction affecting left non-dominant side: Secondary | ICD-10-CM | POA: Diagnosis not present

## 2015-11-16 DIAGNOSIS — J449 Chronic obstructive pulmonary disease, unspecified: Secondary | ICD-10-CM | POA: Diagnosis not present

## 2015-11-17 NOTE — Telephone Encounter (Signed)
Pts wife is aware 

## 2015-11-18 DIAGNOSIS — E119 Type 2 diabetes mellitus without complications: Secondary | ICD-10-CM | POA: Diagnosis not present

## 2015-11-18 DIAGNOSIS — Z4789 Encounter for other orthopedic aftercare: Secondary | ICD-10-CM | POA: Diagnosis not present

## 2015-11-18 DIAGNOSIS — I69354 Hemiplegia and hemiparesis following cerebral infarction affecting left non-dominant side: Secondary | ICD-10-CM | POA: Diagnosis not present

## 2015-11-18 DIAGNOSIS — H913 Deaf nonspeaking, not elsewhere classified: Secondary | ICD-10-CM | POA: Diagnosis not present

## 2015-11-18 DIAGNOSIS — M6281 Muscle weakness (generalized): Secondary | ICD-10-CM | POA: Diagnosis not present

## 2015-11-18 DIAGNOSIS — J449 Chronic obstructive pulmonary disease, unspecified: Secondary | ICD-10-CM | POA: Diagnosis not present

## 2015-11-20 ENCOUNTER — Encounter: Payer: Self-pay | Admitting: Podiatry

## 2015-11-20 ENCOUNTER — Ambulatory Visit (INDEPENDENT_AMBULATORY_CARE_PROVIDER_SITE_OTHER): Payer: Medicare Other | Admitting: Podiatry

## 2015-11-20 VITALS — Ht 64.0 in | Wt 149.0 lb

## 2015-11-20 DIAGNOSIS — M79676 Pain in unspecified toe(s): Secondary | ICD-10-CM | POA: Diagnosis not present

## 2015-11-20 DIAGNOSIS — B351 Tinea unguium: Secondary | ICD-10-CM

## 2015-11-20 DIAGNOSIS — M79609 Pain in unspecified limb: Principal | ICD-10-CM

## 2015-11-20 NOTE — Progress Notes (Signed)
Patient ID: Charles [name], male   DOB: 1955-07-22, 60 y.o.   MRN: [id number] Complaint:  Visit Type: Patient returns to my office for continued preventative foot care services. Complaint: Patient states" my nails have grown long and thick and become painful to walk and wear shoes" Patient has been diagnosed with DM with no foot complications. The patient presents for preventative foot care services. No changes to ROS  Podiatric Exam: Vascular: dorsalis pedis and posterior tibial pulses are palpable bilateral. Capillary return is immediate. Temperature gradient is WNL. Skin turgor WNL  Sensorium: Normal Semmes Weinstein monofilament test. Normal tactile sensation bilaterally. Nail Exam: Pt has thick disfigured discolored nails with subungual debris noted bilateral entire nail hallux  Ulcer Exam: There is no evidence of ulcer or pre-ulcerative changes or infection. Orthopedic Exam: Muscle tone and strength are WNL. No limitations in general ROM. No crepitus or effusions noted. Foot type and digits show no abnormalities. Bony prominences are unremarkable. Skin: No Porokeratosis. No infection or ulcers  Diagnosis:  Onychomycosis, , Pain in right toe, pain in left toes  Treatment & Plan Procedures and Treatment: Consent by patient was obtained for treatment procedures. The patient understood the discussion of treatment and procedures well. All questions were answered thoroughly reviewed. Debridement of mycotic and hypertrophic toenails, 1 through 5 bilateral and clearing of subungual debris. No ulceration, no infection noted. Patient asked about diabetic shoes but he does not qualify. Return Visit-Office Procedure: Patient instructed to return to the office for a follow up visit 3 months for continued evaluation and treatment.   Gardiner Barefoot DPM

## 2015-11-23 DIAGNOSIS — H913 Deaf nonspeaking, not elsewhere classified: Secondary | ICD-10-CM | POA: Diagnosis not present

## 2015-11-23 DIAGNOSIS — Z4789 Encounter for other orthopedic aftercare: Secondary | ICD-10-CM | POA: Diagnosis not present

## 2015-11-23 DIAGNOSIS — J449 Chronic obstructive pulmonary disease, unspecified: Secondary | ICD-10-CM | POA: Diagnosis not present

## 2015-11-23 DIAGNOSIS — M6281 Muscle weakness (generalized): Secondary | ICD-10-CM | POA: Diagnosis not present

## 2015-11-23 DIAGNOSIS — I69354 Hemiplegia and hemiparesis following cerebral infarction affecting left non-dominant side: Secondary | ICD-10-CM | POA: Diagnosis not present

## 2015-11-23 DIAGNOSIS — E119 Type 2 diabetes mellitus without complications: Secondary | ICD-10-CM | POA: Diagnosis not present

## 2015-11-24 ENCOUNTER — Other Ambulatory Visit: Payer: Medicare Other

## 2015-11-24 ENCOUNTER — Ambulatory Visit
Admission: RE | Admit: 2015-11-24 | Discharge: 2015-11-24 | Disposition: A | Discharge status: Home / Self Care | Payer: Medicare Other | Source: Ambulatory Visit | Attending: Neurosurgery | Admitting: Neurosurgery

## 2015-11-24 DIAGNOSIS — M79605 Pain in left leg: Secondary | ICD-10-CM

## 2015-11-24 DIAGNOSIS — M25552 Pain in left hip: Secondary | ICD-10-CM | POA: Diagnosis not present

## 2015-11-25 DIAGNOSIS — I69354 Hemiplegia and hemiparesis following cerebral infarction affecting left non-dominant side: Secondary | ICD-10-CM | POA: Diagnosis not present

## 2015-11-25 DIAGNOSIS — E119 Type 2 diabetes mellitus without complications: Secondary | ICD-10-CM | POA: Diagnosis not present

## 2015-11-25 DIAGNOSIS — M6281 Muscle weakness (generalized): Secondary | ICD-10-CM | POA: Diagnosis not present

## 2015-11-25 DIAGNOSIS — H913 Deaf nonspeaking, not elsewhere classified: Secondary | ICD-10-CM | POA: Diagnosis not present

## 2015-11-25 DIAGNOSIS — Z4789 Encounter for other orthopedic aftercare: Secondary | ICD-10-CM | POA: Diagnosis not present

## 2015-11-25 DIAGNOSIS — J449 Chronic obstructive pulmonary disease, unspecified: Secondary | ICD-10-CM | POA: Diagnosis not present

## 2015-11-26 DIAGNOSIS — M5416 Radiculopathy, lumbar region: Secondary | ICD-10-CM | POA: Diagnosis not present

## 2015-11-26 DIAGNOSIS — M79605 Pain in left leg: Secondary | ICD-10-CM | POA: Diagnosis not present

## 2015-11-30 DIAGNOSIS — J449 Chronic obstructive pulmonary disease, unspecified: Secondary | ICD-10-CM | POA: Diagnosis not present

## 2015-11-30 DIAGNOSIS — Z4789 Encounter for other orthopedic aftercare: Secondary | ICD-10-CM | POA: Diagnosis not present

## 2015-11-30 DIAGNOSIS — I69354 Hemiplegia and hemiparesis following cerebral infarction affecting left non-dominant side: Secondary | ICD-10-CM | POA: Diagnosis not present

## 2015-11-30 DIAGNOSIS — H913 Deaf nonspeaking, not elsewhere classified: Secondary | ICD-10-CM | POA: Diagnosis not present

## 2015-11-30 DIAGNOSIS — M6281 Muscle weakness (generalized): Secondary | ICD-10-CM | POA: Diagnosis not present

## 2015-11-30 DIAGNOSIS — E119 Type 2 diabetes mellitus without complications: Secondary | ICD-10-CM | POA: Diagnosis not present

## 2015-12-01 DIAGNOSIS — H913 Deaf nonspeaking, not elsewhere classified: Secondary | ICD-10-CM | POA: Diagnosis not present

## 2015-12-01 DIAGNOSIS — E119 Type 2 diabetes mellitus without complications: Secondary | ICD-10-CM | POA: Diagnosis not present

## 2015-12-01 DIAGNOSIS — M6281 Muscle weakness (generalized): Secondary | ICD-10-CM | POA: Diagnosis not present

## 2015-12-01 DIAGNOSIS — I69354 Hemiplegia and hemiparesis following cerebral infarction affecting left non-dominant side: Secondary | ICD-10-CM | POA: Diagnosis not present

## 2015-12-01 DIAGNOSIS — Z4789 Encounter for other orthopedic aftercare: Secondary | ICD-10-CM | POA: Diagnosis not present

## 2015-12-01 DIAGNOSIS — J449 Chronic obstructive pulmonary disease, unspecified: Secondary | ICD-10-CM | POA: Diagnosis not present

## 2015-12-03 ENCOUNTER — Ambulatory Visit (INDEPENDENT_AMBULATORY_CARE_PROVIDER_SITE_OTHER): Payer: Medicare Other | Admitting: Neurology

## 2015-12-03 ENCOUNTER — Encounter: Payer: Self-pay | Admitting: Neurology

## 2015-12-03 VITALS — BP 141/83 | HR 85 | Resp 20

## 2015-12-03 DIAGNOSIS — R269 Unspecified abnormalities of gait and mobility: Secondary | ICD-10-CM

## 2015-12-03 DIAGNOSIS — M4802 Spinal stenosis, cervical region: Secondary | ICD-10-CM

## 2015-12-03 DIAGNOSIS — I63311 Cerebral infarction due to thrombosis of right middle cerebral artery: Secondary | ICD-10-CM | POA: Diagnosis not present

## 2015-12-03 NOTE — Progress Notes (Signed)
Chief Complaint  Patient presents with  . Follow-up    with mother, brother, and interpreter, pain in neck doing well  . Neck Pain    doing better, sleeping more  . Extremity Weakness    pain in his legs, numbness/tingling in R and Left arm      PATIENT: Charles [name] DOB: 23-Mar-1955  HISTORICAL  Charles [name] is a 60 years old right-handed male, accompanied by his mother, and his brother, interpreter, for EMG guided Botox injection for his spastic left hemiparesis,I saw him first in Nov 2015, he was previously patients of Dr. Leonie Man, and Dr. Janann Colonel, who has performed EMG guided Botox injection in January, April, July 2015, for his spastic left hemiparesis, which he responded very well  He was born deaf, He had a history of pulmonary sarcoidosis, was treated with long-term low-dose steroid, also with past medical history of hypertension, diabetes, hyperlipidemia,  He suffered stroke in April 2014, was treated at [place], with residual severe left hemiparesis, per record  MRI of the brain showed Remote right hemispheric infarct and small vessel disease type changes  MRA of brain abrupt  cutoff of flow at the right carotid terminus with nonvisualization of right middle cerebral artery branch vessels consistent with the patient's remote right hemispheric infarct. Fetal type origin of the posterior cerebral arteries with posterior cerebral artery supplied predominately from the anterior circulation. 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   2D Echocardiogram 60%, wall motion normal, LA normal size Carotid Doppler Bilateral: 1-39% ICA stenosis. Vertebral artery flow is antegrade  TEE no PFO, Pulm AVM  EEG in August 2014, normal  awake and asleep EEG. No focal or generalized epileptiform discharges noted   UPDATE May 8th 2017: He is with his mother and brother at today's clinical visit, last visit was November 25th 2015 for EMG guided Botox injection  to his spastic left upper and lower extremity, he denies significant improvement with Botox injection.  Today he came in with a new issue, complains a year history of intermittent right elbow discomfort, radiating pain to right arm, right hand, mainly involving right fifths, and fourth fingers, it happened with prolonged sitting, eating, or sleeping, he also noticed mild right hand weakness, difficulty to unscrew the bottle top. 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 He complains progressive worsening gait abnormality, falling few times, neck pain, low back pain, radiating pain to his right leg  UPDATE August 24 2015: He is accompanied by his mother brother and interpreter at today's clinical visit, he continue complains of low back pain radiating pain to right lower extremity, intermittent right arm hands paresthesia, worsening gait difficulty, urinary urgency,  We have personally reviewed MRI of cervical spine in June 2017: There is severe spinal stenosis at C3-C4 and at C4-C5 due to central disc protrusions/herniations, uncovertebral spurring, facet hypertrophy and congenitally short pedicles. There is subtle hyperintense signal within the spinal cord on sagittal STIR images just below the C3-C4 interspace but this is not confirmed on axial images. This could represent a mild cervical myelopathy. 2. There is moderate spinal stenosis at C5-C6 and C6-C7 mild spinal stenosis at C2-C3 to degenerative changes and congenitally short pedicles. 3. At  C3-C4 there is moderately severe bilateral foraminal narrowing at could lead to impingement either of the C4 nerve roots. There does not appear to be nerve root impingement  at the other cervical levels  MRI of lumbar spine in June 2017: At L4-L5, there is broad disc bulging, moderately severe facet hypertrophy, right greater than left, and minimal anterolisthesis. There is no nerve root compression at this level. When compared to the MRI dated 12/24/2012, the synovial cyst that was noted to the left on the prior MRI is no longer present. This relieves the left L5 nerve root impingement that was noted at that time. However, since the prior MRI there has been mild progression of the facet hypertrophy and minimal anterolisthesis . 2. There are milder degenerative changes at L3-L4 and L5-S1 that did not lead to any nerve root impingement, unchanged when compared to the previous MRI.  UPDATE Dec 03 2015: He had cervical decompression laminectomy C3-4-5 6 with foraminotomies of the C4-5 VI nerve roots by Dr. Saintclair Halsted on October 07 2015, patient reported significant improvement in his neck pain, family noticed he has difficulty sleeping, agitated.  I personally reviewed MRI cervical spine in September 2017: Posterior hardware fusion C3 through C7. Posterior decompression C3 through C5. Small posterior fluid collection in the soft tissues may represent postop fluid versus CSF leak. Small area of cord hyperintensity at the C4 level is unchanged compatible with chronic myelomalacia. No cord compression.  He complains of low back pain, left that time pain, had personally reviewed MRI of left femur there was no significant abnormality notice, MRI lumbar in June 2017, multilevel degenerative changes, but there was no significant canal or foraminal stenosis    REVIEW OF SYSTEMS: Full 14 system review of systems performed and notable only for as above ALLERGIES: Allergies  Allergen Reactions  . Shellfish Allergy Hives and Swelling    SWELLING REACTION UNSPECIFIED   . Hydrocodone Other (See Comments)    Makes "looney"  . Contrast Media [Iodinated Diagnostic Agents] Itching  .  Oxycodone Nausea And Vomiting    HOME MEDICATIONS: Current Outpatient Prescriptions on File Prior to Visit  Medication Sig Dispense Refill  . acetaminophen (TYLENOL) 650 MG CR tablet Take 650 mg by mouth every 8 (eight) hours as needed for pain.    [name] aspirin 325 MG EC tablet Take 325 mg by mouth daily.    [name] atorvastatin (LIPITOR) 10 MG tablet Take 10 mg by mouth daily. (cholesterol)    . BENICAR 20 MG tablet take 1 tablet by mouth once daily 30 tablet 0  . budesonide-formoterol (SYMBICORT) 80-4.5 MCG/ACT inhaler Inhale 2 puffs into the lungs 2 (two) times daily as needed (shortness of breath/ wheezing).    . clopidogrel (PLAVIX) 75 MG tablet Take 75 mg by mouth daily.    [name] esomeprazole (NEXIUM) 40 MG capsule Take 1 capsule (40 mg total) by mouth 2 (two) times daily. 60 capsule 0  . fluticasone furoate-vilanterol (BREO ELLIPTA) 100-25 MCG/INH AEPB Inhale 1 puff into the lungs as needed.     . furosemide (LASIX) 20 MG tablet Take 20 mg by mouth daily.     [name] gabapentin (NEURONTIN) 300 MG capsule Take 1 capsule (300 mg total) by mouth 3 (three) times daily. (Patient taking differently: Take 300 mg by mouth daily. ) 90 capsule 11  . HYDROcodone-acetaminophen (NORCO) 10-325 MG tablet Take 2 tablets by mouth every 4 (four) hours as needed for moderate pain. 60 tablet 0  . iron polysaccharides (NIFEREX) 150  MG capsule Take 1 capsule (150 mg total) by mouth daily. 30 capsule 2  . meloxicam (MOBIC) 7.5 MG tablet Take 1 tablet (7.5 mg total) by mouth 2 (two) times daily. 60 tablet 6  . mometasone-formoterol (DULERA) 100-5 MCG/ACT AERO Take 2 puffs first thing in am and then another 2 puffs about 12 hours later. (Patient taking differently: Inhale 2 puffs into the lungs every 12 (twelve) hours. ) 1 Inhaler 11  . naproxen (NAPROSYN) 500 MG tablet Take 500 mg by mouth 2 (two) times daily with a meal.  0  . NEXIUM 40 MG capsule take 1 capsule by mouth 30 MINUTES PRIOR TO MEALS----TAKE TWICE A DAY 60 capsule 11   . oxybutynin (DITROPAN XL) 15 MG 24 hr tablet Take 15 mg by mouth at bedtime.    . polyethylene glycol powder (GLYCOLAX/MIRALAX) powder Take 17 g by mouth at bedtime as needed for mild constipation. Mix in 8 oz liquid and drink    . predniSONE (DELTASONE) 1 MG tablet Take 1 mg by mouth daily with breakfast.    . sitaGLIPtin-metformin (JANUMET) 50-500 MG tablet Take 1 tablet by mouth 2 (two) times daily with a meal.    . traMADol (ULTRAM) 50 MG tablet Take 1 tablet (50 mg total) by mouth every 8 (eight) hours as needed. (Patient taking differently: Take 50 mg by mouth every 8 (eight) hours as needed (pain). ) 60 tablet 3   Current Facility-Administered Medications on File Prior to Visit  Medication Dose Route Frequency Provider Last Rate Last Dose  . botulinum toxin Type A (BOTOX) injection 300 Units  300 Units Intramuscular Once Drema Dallas, DO        PAST MEDICAL HISTORY: Past Medical History:  Diagnosis Date  . Anemia   . Asthma   . Essential hypertension, benign   . GERD (gastroesophageal reflux disease)   . Headache    migraines in the past per pt  . Hearing impaired   . History of stroke    Spastic hemiplegia, right MCA stroke April 2014 in [place]  . Mixed hyperlipidemia   . PAD (peripheral artery disease) ([place])   . Sarcoidosis ([place])   . Stroke [facility])    2014, left sided weakness  . Type 2 diabetes mellitus ([place])     PAST SURGICAL HISTORY: Past Surgical History:  Procedure Laterality Date  . BRAVO PH STUDY N/A 06/18/2013   pH STUDY SHOWS NEXIUM TWICE DAILY CONTROLS THE ACID IN HIS STOMACH. HE HAD VERY FEWEPISODE OF REGURGITATION RECORDED IN THE 2 DAYS THE STUDY WAS PERFORMED.   [name] COLONOSCOPY  2011   IN [place]  . ESOPHAGOGASTRODUODENOSCOPY N/A 06/18/2013   Dr.Fields- probable proximal esophageal web,dilation performed, bravo cap placed, mild non-erosive gastritis in the gastric antrum and on the greater curvature of the gastric body bx- granulomatous gastritis,  duodenal mucosa showed no abnormalities in the bulb and second portion of the duodenum.   . ESOPHAGOGASTRODUODENOSCOPY N/A 05/05/2015   Procedure: ESOPHAGOGASTRODUODENOSCOPY (EGD);  Surgeon: Danie Binder, MD;  [facility];  Service: Endoscopy;  Laterality: N/A;  730  . MALONEY DILATION N/A 06/18/2013   Procedure: MALONEY DILATION;  Surgeon: Danie Binder, MD;  Location: [facility];  Service: Endoscopy;  Laterality: N/A;  . OTHER SURGICAL HISTORY     cyst removal head and chest  . POSTERIOR CERVICAL FUSION/FORAMINOTOMY N/A 10/07/2015   Procedure: Cervical Three-Four, Cervical Four-Five, Cervical Five-Six, Cervical Six-Seven Posterior Cervical Laminectomy and Fusion;  Surgeon: Kary Kos,  MD;  Location: [place] [facility];  Service: Neurosurgery;  Laterality: N/A;  . SAVORY DILATION N/A 06/18/2013   Procedure: SAVORY DILATION;  Surgeon: Danie Binder, MD;  Location: [facility];  Service: Endoscopy;  Laterality: N/A;  . TEE WITHOUT CARDIOVERSION N/A 10/04/2012   Procedure: TRANSESOPHAGEAL ECHOCARDIOGRAM (TEE);  Surgeon: Thayer Headings, MD;  Location: [facility] ENDOSCOPY;  Service: Cardiovascular;  Laterality: N/A;  . [place] accident  Both legs fractured and repaired surgically    FAMILY HISTORY: Family History  Problem Relation Age of Onset  . Diabetes Mother   . Hypertension Mother   . Diabetes Father   . Hypertension Father   . Colon polyps Neg Hx   . Colon cancer Neg Hx     SOCIAL HISTORY:  Social History   Social History  . Marital status: Single    Spouse name: N/A  . Number of children: 0  . Years of education: HS   Occupational History  . UNEMPLOYED    Social History Main Topics  . Smoking status: Former Smoker    Packs/day: 1.00    Years: 29.00    Types: Cigarettes    Quit date: 02/22/1992  . Smokeless tobacco: Never Used  . Alcohol use 0.0 oz/week     Comment: Socially  . Drug use:      Comment: hx of crack cocaine "long time  ago" per pt  . Sexual activity: No   Other Topics Concern  . Not on file   Social History Narrative   Patient lives at home with his family. Brother, sister, mother    Patient does not work.    Patient has a high school education.    Patient has one step child      PHYSICAL EXAM   Vitals:   12/03/15 0909  BP: (!) 141/83  Pulse: 85  Resp: 20    Not recorded      There is no height or weight on file to calculate BMI.   PHYSICAL EXAMNIATION:  Gen: NAD, conversant, well nourised, obese, well groomed                     Cardiovascular: Regular rate rhythm, no peripheral edema, warm, nontender. Eyes: Conjunctivae clear without exudates or hemorrhage Neck: Supple, no carotid bruise. Pulmonary: Clear to auscultation bilaterally   NEUROLOGICAL EXAM:  MENTAL STATUS: Speech: He is mute depend on interpreter for history,   CRANIAL NERVES: CN II: Visual fields are full to confrontation. Fundoscopic exam is normal with sharp discs and no vascular changes. Pupils are round equal and briskly reactive to light. CN III, IV, VI: extraocular movement are normal. No ptosis. CN V: Facial sensation is intact to pinprick in all 3 divisions bilaterally. Corneal responses are intact.  CN VII: Face is symmetric with normal eye closure and smile. CN VIII: deaf CN IX, X: Palate elevates symmetrically. Phonation is normal. CN XI: Head turning and shoulder shrug are intact CN XII: Tongue is midline with normal movements and no atrophy.  MOTOR: Spastic left hemiparesis, wearing left ankle brace, mild left shoulder antigravity movement left shoulder abduction with passive movement maximum 90, left elbow 170, complains of left elbow pain with passive movement, left thumb in position, left hip flexion 4, left knee extension 4, left knee flexion 4, he also has mild right ankle dorsiflexion weakness.  He has right elbow Tinel signs, mild right finger abduction weakness REFLEXES:  Hyperreflexia  of both side, worse on the left side, right-sided Babinski sign.  SENSORY: Intact to light touch, pinprick, positional and vibratory sensation are intact in fingers and toes, with exception of decreased light touch in the right fifth, and half of right fourth finger extending to right ulnar palmar, dorsum of right hand  COORDINATION: There is no dysmetria on right finger-to-nose and heel-knee-shin.    GAIT/STANCE: Rely on his 4 foot cane, multiple tends to get up from seated position, left hemi-circumferential, unsteady gait      Lab Results  Component Value Date   WBC 4.9 10/23/2015   HGB 12.4 (L) 10/23/2015   HCT 35.8 (L) 10/23/2015   MCV 88.2 10/23/2015   PLT 172 10/23/2015      Component Value Date/Time   NA 136 05/22/2015 1529   K 4.0 05/22/2015 1529   CL 102 05/22/2015 1529   CO2 26 05/22/2015 1529   GLUCOSE 138 (H) 05/22/2015 1529   BUN 14 05/22/2015 1529   CREATININE 0.95 05/22/2015 1529   CALCIUM 8.6 (L) 05/22/2015 1529   PROT 7.2 05/22/2015 1529   ALBUMIN 4.4 05/22/2015 1529   AST 27 05/22/2015 1529   ALT 28 05/22/2015 1529   ALKPHOS 74 05/22/2015 1529   BILITOT 0.4 05/22/2015 1529   GFRNONAA >60 05/22/2015 1529   GFRAA >60 05/22/2015 1529   Lab Results  Component Value Date   CHOL 164 11/30/2012   HDL 79 11/30/2012   LDLCALC 77 11/30/2012   TRIG 42 11/30/2012   CHOLHDL 2.1 11/30/2012   Lab Results  Component Value Date   HGBA1C 5.1 09/30/2015   Lab Results  Component Value Date   VITAMINB12 399 05/22/2015   Lab Results  Component Value Date   TSH 2.38 11/12/2015      ASSESSMENT AND PLAN  Charles [name] is a 60 y.o. male  History of stroke, with residual spastic left hemiparesis  He denies significant improvement with EMG guided botulism toxin injection, will put it on hold now  Cervical spondylitic myelopathy  Status post cervical decompression surgery in August 2017, his neck pain has much improved,  Low back pain, left anterior  thigh and hip pain, gait abnormality  Musculoskeletal etiology  Continue hot compression  Mobic 7.5 milligrams twice a day,  Tramadol as needed  Congenital deaf, use sign language, history is through interpreter       Marcial Pacas, M.D. Ph.D.  [facility] [address] [place],  [address] [phone]

## 2015-12-04 DIAGNOSIS — R3129 Other microscopic hematuria: Secondary | ICD-10-CM | POA: Diagnosis not present

## 2015-12-04 DIAGNOSIS — N281 Cyst of kidney, acquired: Secondary | ICD-10-CM | POA: Diagnosis not present

## 2015-12-04 DIAGNOSIS — E119 Type 2 diabetes mellitus without complications: Secondary | ICD-10-CM | POA: Diagnosis not present

## 2015-12-04 DIAGNOSIS — H913 Deaf nonspeaking, not elsewhere classified: Secondary | ICD-10-CM | POA: Diagnosis not present

## 2015-12-04 DIAGNOSIS — M6281 Muscle weakness (generalized): Secondary | ICD-10-CM | POA: Diagnosis not present

## 2015-12-04 DIAGNOSIS — I69354 Hemiplegia and hemiparesis following cerebral infarction affecting left non-dominant side: Secondary | ICD-10-CM | POA: Diagnosis not present

## 2015-12-04 DIAGNOSIS — J449 Chronic obstructive pulmonary disease, unspecified: Secondary | ICD-10-CM | POA: Diagnosis not present

## 2015-12-04 DIAGNOSIS — Z4789 Encounter for other orthopedic aftercare: Secondary | ICD-10-CM | POA: Diagnosis not present

## 2015-12-04 DIAGNOSIS — N319 Neuromuscular dysfunction of bladder, unspecified: Secondary | ICD-10-CM | POA: Diagnosis not present

## 2015-12-07 ENCOUNTER — Other Ambulatory Visit: Payer: Self-pay | Admitting: *Deleted

## 2015-12-07 MED ORDER — GABAPENTIN 300 MG PO CAPS: 300.0000 mg | ORAL_CAPSULE | Freq: Three times a day (TID) | ORAL | 11 refills | Status: DC

## 2015-12-07 MED ORDER — MELOXICAM 7.5 MG PO TABS: 7.5000 mg | ORAL_TABLET | Freq: Two times a day (BID) | ORAL | 5 refills | Status: DC | PRN

## 2015-12-08 ENCOUNTER — Telehealth: Payer: Self-pay | Admitting: Neurology

## 2015-12-08 DIAGNOSIS — I69354 Hemiplegia and hemiparesis following cerebral infarction affecting left non-dominant side: Secondary | ICD-10-CM | POA: Diagnosis not present

## 2015-12-08 DIAGNOSIS — J449 Chronic obstructive pulmonary disease, unspecified: Secondary | ICD-10-CM | POA: Diagnosis not present

## 2015-12-08 DIAGNOSIS — E119 Type 2 diabetes mellitus without complications: Secondary | ICD-10-CM | POA: Diagnosis not present

## 2015-12-08 DIAGNOSIS — H913 Deaf nonspeaking, not elsewhere classified: Secondary | ICD-10-CM | POA: Diagnosis not present

## 2015-12-08 DIAGNOSIS — M6281 Muscle weakness (generalized): Secondary | ICD-10-CM | POA: Diagnosis not present

## 2015-12-08 DIAGNOSIS — Z4789 Encounter for other orthopedic aftercare: Secondary | ICD-10-CM | POA: Diagnosis not present

## 2015-12-08 NOTE — Telephone Encounter (Addendum)
Spoke to patient's mother on HIPAA - he is using gabapentin but it not taking meloxicam any longer.  [place] aware of this change.  They have an updated rx for his gabapentin.

## 2015-12-08 NOTE — Telephone Encounter (Signed)
Nicole/[facility] [phone] called to request refills of gabapentin (NEURONTIN) 300 MG capsule and meloxicam (MOBIC) 7.5 MG tablet.

## 2015-12-10 DIAGNOSIS — Z4789 Encounter for other orthopedic aftercare: Secondary | ICD-10-CM | POA: Diagnosis not present

## 2015-12-10 DIAGNOSIS — I69354 Hemiplegia and hemiparesis following cerebral infarction affecting left non-dominant side: Secondary | ICD-10-CM | POA: Diagnosis not present

## 2015-12-10 DIAGNOSIS — H913 Deaf nonspeaking, not elsewhere classified: Secondary | ICD-10-CM | POA: Diagnosis not present

## 2015-12-10 DIAGNOSIS — J449 Chronic obstructive pulmonary disease, unspecified: Secondary | ICD-10-CM | POA: Diagnosis not present

## 2015-12-10 DIAGNOSIS — E119 Type 2 diabetes mellitus without complications: Secondary | ICD-10-CM | POA: Diagnosis not present

## 2015-12-10 DIAGNOSIS — M6281 Muscle weakness (generalized): Secondary | ICD-10-CM | POA: Diagnosis not present

## 2015-12-15 DIAGNOSIS — Z7982 Long term (current) use of aspirin: Secondary | ICD-10-CM | POA: Diagnosis not present

## 2015-12-15 DIAGNOSIS — Z7902 Long term (current) use of antithrombotics/antiplatelets: Secondary | ICD-10-CM | POA: Diagnosis not present

## 2015-12-15 DIAGNOSIS — H913 Deaf nonspeaking, not elsewhere classified: Secondary | ICD-10-CM | POA: Diagnosis not present

## 2015-12-15 DIAGNOSIS — J449 Chronic obstructive pulmonary disease, unspecified: Secondary | ICD-10-CM | POA: Diagnosis not present

## 2015-12-15 DIAGNOSIS — Z87891 Personal history of nicotine dependence: Secondary | ICD-10-CM | POA: Diagnosis not present

## 2015-12-15 DIAGNOSIS — M6281 Muscle weakness (generalized): Secondary | ICD-10-CM | POA: Diagnosis not present

## 2015-12-15 DIAGNOSIS — D869 Sarcoidosis, unspecified: Secondary | ICD-10-CM | POA: Diagnosis not present

## 2015-12-15 DIAGNOSIS — I69354 Hemiplegia and hemiparesis following cerebral infarction affecting left non-dominant side: Secondary | ICD-10-CM | POA: Diagnosis not present

## 2015-12-15 DIAGNOSIS — E119 Type 2 diabetes mellitus without complications: Secondary | ICD-10-CM | POA: Diagnosis not present

## 2015-12-15 DIAGNOSIS — Z4789 Encounter for other orthopedic aftercare: Secondary | ICD-10-CM | POA: Diagnosis not present

## 2015-12-15 DIAGNOSIS — Z9181 History of falling: Secondary | ICD-10-CM | POA: Diagnosis not present

## 2015-12-16 DIAGNOSIS — I69354 Hemiplegia and hemiparesis following cerebral infarction affecting left non-dominant side: Secondary | ICD-10-CM | POA: Diagnosis not present

## 2015-12-16 DIAGNOSIS — M6281 Muscle weakness (generalized): Secondary | ICD-10-CM | POA: Diagnosis not present

## 2015-12-16 DIAGNOSIS — J449 Chronic obstructive pulmonary disease, unspecified: Secondary | ICD-10-CM | POA: Diagnosis not present

## 2015-12-16 DIAGNOSIS — H913 Deaf nonspeaking, not elsewhere classified: Secondary | ICD-10-CM | POA: Diagnosis not present

## 2015-12-16 DIAGNOSIS — Z4789 Encounter for other orthopedic aftercare: Secondary | ICD-10-CM | POA: Diagnosis not present

## 2015-12-16 DIAGNOSIS — E119 Type 2 diabetes mellitus without complications: Secondary | ICD-10-CM | POA: Diagnosis not present

## 2015-12-17 DIAGNOSIS — J449 Chronic obstructive pulmonary disease, unspecified: Secondary | ICD-10-CM | POA: Diagnosis not present

## 2015-12-17 DIAGNOSIS — M6281 Muscle weakness (generalized): Secondary | ICD-10-CM | POA: Diagnosis not present

## 2015-12-17 DIAGNOSIS — E119 Type 2 diabetes mellitus without complications: Secondary | ICD-10-CM | POA: Diagnosis not present

## 2015-12-17 DIAGNOSIS — H913 Deaf nonspeaking, not elsewhere classified: Secondary | ICD-10-CM | POA: Diagnosis not present

## 2015-12-17 DIAGNOSIS — Z4789 Encounter for other orthopedic aftercare: Secondary | ICD-10-CM | POA: Diagnosis not present

## 2015-12-17 DIAGNOSIS — I69354 Hemiplegia and hemiparesis following cerebral infarction affecting left non-dominant side: Secondary | ICD-10-CM | POA: Diagnosis not present

## 2015-12-22 ENCOUNTER — Encounter: Payer: Self-pay | Admitting: *Deleted

## 2015-12-22 DIAGNOSIS — M79605 Pain in left leg: Secondary | ICD-10-CM | POA: Diagnosis not present

## 2015-12-22 DIAGNOSIS — M5416 Radiculopathy, lumbar region: Secondary | ICD-10-CM | POA: Diagnosis not present

## 2015-12-23 ENCOUNTER — Encounter (HOSPITAL_COMMUNITY): Payer: Medicare Other

## 2015-12-23 ENCOUNTER — Encounter (HOSPITAL_COMMUNITY): Payer: Self-pay | Admitting: Oncology

## 2015-12-23 ENCOUNTER — Encounter (HOSPITAL_COMMUNITY): Payer: Medicare Other | Attending: Oncology | Admitting: Oncology

## 2015-12-23 VITALS — BP 163/99 | HR 99 | Temp 98.4°F | Resp 18 | Wt 144.7 lb

## 2015-12-23 DIAGNOSIS — D509 Iron deficiency anemia, unspecified: Secondary | ICD-10-CM | POA: Insufficient documentation

## 2015-12-23 DIAGNOSIS — D61818 Other pancytopenia: Secondary | ICD-10-CM | POA: Diagnosis not present

## 2015-12-23 DIAGNOSIS — Z23 Encounter for immunization: Secondary | ICD-10-CM | POA: Diagnosis present

## 2015-12-23 LAB — CBC WITH DIFFERENTIAL/PLATELET
Basophils Absolute: 0 10*3/uL (ref 0.0–0.1)
Basophils Relative: 0 %
EOS PCT: 0 %
Eosinophils Absolute: 0 10*3/uL (ref 0.0–0.7)
HEMATOCRIT: 40 % (ref 39.0–52.0)
Hemoglobin: 13.7 g/dL (ref 13.0–17.0)
LYMPHS ABS: 1.3 10*3/uL (ref 0.7–4.0)
LYMPHS PCT: 10 %
MCH: 29.7 pg (ref 26.0–34.0)
MCHC: 34.3 g/dL (ref 30.0–36.0)
MCV: 86.6 fL (ref 78.0–100.0)
MONO ABS: 0.8 10*3/uL (ref 0.1–1.0)
MONOS PCT: 6 %
NEUTROS ABS: 10.9 10*3/uL — AB (ref 1.7–7.7)
Neutrophils Relative %: 84 %
PLATELETS: 172 10*3/uL (ref 150–400)
RBC: 4.62 MIL/uL (ref 4.22–5.81)
RDW: 13.2 % (ref 11.5–15.5)
WBC: 12.9 10*3/uL — ABNORMAL HIGH (ref 4.0–10.5)

## 2015-12-23 MED ORDER — INFLUENZA VAC SPLIT QUAD 0.5 ML IM SUSY
0.5000 mL | PREFILLED_SYRINGE | Freq: Once | INTRAMUSCULAR | Status: AC
  Administered 2015-12-23: 0.5 mL via INTRAMUSCULAR

## 2015-12-23 MED ORDER — INFLUENZA VAC SPLIT QUAD 0.5 ML IM SUSY
PREFILLED_SYRINGE | INTRAMUSCULAR | Status: AC
  Filled 2015-12-23: qty 0.5

## 2015-12-23 NOTE — Progress Notes (Signed)
[facility] [place], [place] [address]  Need for prophylactic vaccination and inoculation against influenza  Other pancytopenia ([place]) - Plan: CBC with Differential  CURRENT THERAPY: Observation and PO iron supplementation.  INTERVAL HISTORY: Charles [name] 60 y.o. male returns for followup of Pancytopenia with a history of sarcoidosis, granulomatous gastritis, and prior CVA with left sided weakness.  He underwent decompressive cervical laminectomy C3, C4, C5, and C6 with foraminotomies of the C4, C5, C6 nerve roots, lateral mass fixation utilizing the globus ellipse lateral mass screw system from C3-C7, and posterior lateral arthrodesis C3-C7 utilizing locally harvested autograft mixed with DBX and BMP by Dr. Saintclair Halsted on 10/07/2015.  He notes low back pain that is chronic x 3-4 years.  He notes that it is about the same.  He is using Tramadol and this helps minimally.  He notes urinary frequency without pelvic pain, urinary burning, malodorous urine, or hematuria.  He reports that his urinary frequency is at baseline.  He sees Dr. Exie Parody in [place] for his urology care.  Review of Systems  Constitutional: Negative.  Negative for chills, fever and weight loss.  HENT: Negative.   Eyes: Negative.   Respiratory: Negative.  Negative for cough, sputum production and shortness of breath.   Cardiovascular: Negative.  Negative for chest pain.  Gastrointestinal: Negative for blood in stool, constipation, diarrhea, melena, nausea and vomiting.  Genitourinary: Positive for frequency. Negative for dysuria, flank pain and hematuria.  Musculoskeletal: Positive for back pain (Chronic since at least 2014).  Skin: Negative.   Neurological: Negative.  Negative for weakness.  Endo/Heme/Allergies: Negative.   Psychiatric/Behavioral: Negative.     Past Medical History:  Diagnosis Date  . Anemia   . Asthma   . Essential hypertension, benign   . GERD (gastroesophageal reflux disease)   .  Headache    migraines in the past per pt  . Hearing impaired   . History of stroke    Spastic hemiplegia, right MCA stroke April 2014 in [place]  . Mixed hyperlipidemia   . Other pancytopenia ([place]) 12/23/2015  . PAD (peripheral artery disease) ([place])   . Sarcoidosis ([place])   . Stroke [facility])    2014, left sided weakness  . Type 2 diabetes mellitus ([place])     Past Surgical History:  Procedure Laterality Date  . BRAVO PH STUDY N/A 06/18/2013   pH STUDY SHOWS NEXIUM TWICE DAILY CONTROLS THE ACID IN HIS STOMACH. HE HAD VERY FEWEPISODE OF REGURGITATION RECORDED IN THE 2 DAYS THE STUDY WAS PERFORMED.   [name] COLONOSCOPY  2011   [place]  . 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   . ESOPHAGOGASTRODUODENOSCOPY N/A 05/05/2015   Procedure: ESOPHAGOGASTRODUODENOSCOPY (EGD);  Surgeon: Danie Binder, MD;  [facility];  Service: Endoscopy;  Laterality: N/A;  730  . MALONEY DILATION N/A 06/18/2013   Procedure: MALONEY DILATION;  Surgeon: Danie Binder, MD;  [facility];  Service: Endoscopy;  Laterality: N/A;  . OTHER SURGICAL HISTORY     cyst removal head and chest  . POSTERIOR CERVICAL FUSION/FORAMINOTOMY N/A 10/07/2015   Procedure: Cervical Three-Four, Cervical Four-Five, Cervical Five-Six, Cervical Six-Seven Posterior Cervical Laminectomy and Fusion;  Surgeon: Kary Kos, MD;  Location:  [place] NEURO ORS;  Service: Neurosurgery;  Laterality: N/A;  . SAVORY DILATION N/A 06/18/2013   Procedure: SAVORY DILATION;  Surgeon: Danie Binder, MD;  [facility]: [facility];  Service: Endoscopy;  Laterality: N/A;  . TEE WITHOUT CARDIOVERSION N/A 10/04/2012   Procedure: TRANSESOPHAGEAL ECHOCARDIOGRAM  (TEE);  Surgeon: Thayer Headings, MD;  Location: [facility] ENDOSCOPY;  Service: Cardiovascular;  Laterality: N/A;  . [place] accident  Both legs fractured and repaired surgically    Family History  Problem Relation Age of Onset  . Diabetes Mother   . Hypertension Mother   . Diabetes Father   . Hypertension Father   . Colon polyps Neg Hx   . Colon cancer Neg Hx     Social History   Social History  . Marital status: Single    Spouse name: N/A  . Number of children: 0  . Years of education: HS   Occupational History  . UNEMPLOYED    Social History Main Topics  . Smoking status: Former Smoker    Packs/day: 1.00    Years: 29.00    Types: Cigarettes    Quit date: 02/22/1992  . Smokeless tobacco: Never Used  . Alcohol use 0.0 oz/week     Comment: Socially  . Drug use:      Comment: hx of crack cocaine "long time ago" per pt  . Sexual activity: No   Other Topics Concern  . None   Social History Narrative   Patient lives at home with his family. Brother, sister, mother    Patient does not work.    Patient has a high school education.    Patient has one step child      PHYSICAL EXAMINATION  ECOG PERFORMANCE STATUS: 1 - Symptomatic but completely ambulatory  Vitals:   12/23/15 1033  BP: (!) 163/99  Pulse: 99  Resp: 18  Temp: 98.4 F (36.9 C)    GENERAL:alert, no distress, well nourished, well developed, comfortable, cooperative, smiling and accompanied by his brother, mother, and sign language interpreter. SKIN: skin color, texture, turgor are normal, no rashes or significant lesions HEAD: Normocephalic, No masses, lesions, tenderness or abnormalities EYES: normal, Conjunctiva are pink and non-injected EARS: External ears normal OROPHARYNX:lips, buccal mucosa, and tongue normal and mucous membranes are moist  NECK: soft neck brace in place. LYMPH:  no palpable lymphadenopathy BREAST:not examined LUNGS: clear to auscultation and  percussion HEART: regular rate & rhythm, no murmurs and no gallops ABDOMEN:abdomen soft, obese and normal bowel sounds BACK: Back symmetric, no curvature. EXTREMITIES:less then 2 second capillary refill, no joint deformities, effusion, or inflammation, no skin discoloration, no cyanosis  NEURO: alert & oriented x 3 with fluent speech, deaf, gait normal   LABORATORY DATA: CBC    Component Value Date/Time   WBC 12.9 (H) 12/23/2015 0951   RBC 4.62 12/23/2015 0951   HGB 13.7 12/23/2015 0951   HCT 40.0 12/23/2015 0951   PLT 172 12/23/2015 0951   MCV 86.6 12/23/2015 0951   MCH 29.7 12/23/2015 0951   MCHC 34.3 12/23/2015 0951   RDW 13.2 12/23/2015 0951   LYMPHSABS 1.3 12/23/2015 0951   MONOABS 0.8 12/23/2015 0951   EOSABS 0.0 12/23/2015 0951   BASOSABS 0.0 12/23/2015 0951      Chemistry      Component Value Date/Time   NA 136 05/22/2015 1529   K 4.0 05/22/2015 1529   CL 102 05/22/2015 1529   CO2 26 05/22/2015 1529  BUN 14 05/22/2015 1529   CREATININE 0.95 05/22/2015 1529      Component Value Date/Time   CALCIUM 8.6 (L) 05/22/2015 1529   ALKPHOS 74 05/22/2015 1529   AST 27 05/22/2015 1529   ALT 28 05/22/2015 1529   BILITOT 0.4 05/22/2015 1529     Lab Results  Component Value Date   IRON 100 09/22/2015   TIBC 335 09/22/2015   FERRITIN 74 09/22/2015     PENDING LABS:   RADIOGRAPHIC STUDIES:  Mr Femur Left Wo Contrast  Result Date: 11/24/2015 CLINICAL DATA:  Left thigh pain anteriorly.  No trauma. EXAM: MR OF THE LEFT FEMUR WITHOUT CONTRAST TECHNIQUE: Multiplanar, multisequence MR imaging was performed. No intravenous contrast was administered. COMPARISON:  None. FINDINGS: Bones/Joint/Cartilage No fracture or dislocation. No avascular necrosis. No periosteal reaction or bone destruction. Metallic hardware in the right knee from prior arthroplasty with adjacent susceptibility artifact. Normal alignment. No joint effusion. Small bilateral femoral head marginal  osteophytes. Right distal femoral orthopedic hardware resulting in susceptibility artifact partially obscuring the adjacent soft tissue and osseous structures. Ligaments Collateral ligaments are intact. Muscles and Tendons Overall the left thigh musculature is relatively smaller in size compared with the right without significant fatty atrophy. Flexors: Normal. Extensors: Normal. Abductors: Normal. Adductors: Normal. Rotators: Normal. Hamstrings: Normal. Soft tissue No fluid collection or hematoma. No soft tissue mass. Sciatic nerve is normal bilaterally. IMPRESSION: 1. No MRI findings to explain the patient's left anterior thigh pain. Electronically Signed   By: Kathreen Devoid   On: 11/24/2015 15:15     PATHOLOGY:    ASSESSMENT AND PLAN:  Other pancytopenia ([place]) Pancytopenia with a history of sarcoidosis, granulomatous gastritis, and prior CVA with left sided weakness.  Labs today: CBC diff.  I personally reviewed and went over laboratory results with the patient.  The results are noted within this dictation. HGB and PLT count are WNL today.  Mild leukocytosis with neutrophilia is noted.  Patient denies any fevers, chills, cough, sputum production, and urinary complaints.  No findings or concerns for infection at this time.  He is advised to follow-up with his primary care provider/neurology/urology as directed.  Influenza immunization today.   Labs in 4 months: CBC diff.  Return in 4 months for follow-up.   ORDERS PLACED FOR THIS ENCOUNTER: Orders Placed This Encounter  Procedures  . CBC with Differential    MEDICATIONS PRESCRIBED THIS ENCOUNTER: Meds ordered this encounter  Medications  . meloxicam (MOBIC) 7.5 MG tablet    Refill:  1  . Influenza vac split quadrivalent PF (FLUARIX) injection 0.5 mL    THERAPY PLAN:  Continue observation of blood counts.  All questions were answered. The patient knows to call the clinic with any problems, questions or concerns. We can  certainly see the patient much sooner if necessary.  Patient and plan discussed with Dr. Ancil Linsey and she is in agreement with the aforementioned.   This note is electronically signed by: Doy Mince 12/23/2015 9:36 PM

## 2015-12-23 NOTE — Assessment & Plan Note (Addendum)
Pancytopenia with a history of sarcoidosis, granulomatous gastritis, and prior CVA with left sided weakness.  Labs today: CBC diff.  I personally reviewed and went over laboratory results with the patient.  The results are noted within this dictation. HGB and PLT count are WNL today.  Mild leukocytosis with neutrophilia is noted.  Patient denies any fevers, chills, cough, sputum production, and urinary complaints.  No findings or concerns for infection at this time.  He is advised to follow-up with his primary care provider/neurology/urology as directed.  Influenza immunization today.   Labs in 4 months: CBC diff.  Return in 4 months for follow-up.

## 2015-12-23 NOTE — Patient Instructions (Signed)
Charles [name] at [facility] Discharge Instructions  RECOMMENDATIONS MADE BY THE CONSULTANT AND ANY TEST RESULTS WILL BE SENT TO YOUR REFERRING PHYSICIAN.  You were seen today by Kirby Crigler PA-C. Flu shot given today. Continue taking iron supplement. Return in 4 months for follow up and labs.   Thank you for choosing [place] at [facility] to provide your oncology and hematology care.  To afford each patient quality time with our provider, please arrive at least 15 minutes before your scheduled appointment time.   Beginning January 23rd 2017 lab work for the [name] will be done in the  Main lab at [facility] on 1st floor. If you have a lab appointment with the [name] please come in thru the  Main Entrance and check in at the main information desk  You need to re-schedule your appointment should you arrive 10 or more minutes late.  We strive to give you quality time with our providers, and arriving late affects you and other patients whose appointments are after yours.  Also, if you no show three or more times for appointments you may be dismissed from the clinic at the providers discretion.     Again, thank you for choosing [facility].  Our hope is that these requests will decrease the amount of time that you wait before being seen by our physicians.       _____________________________________________________________  Should you have questions after your visit to [name], please contact our office at [phone] between the hours of 8:30 a.m. and 4:30 p.m.  Voicemails left after 4:30 p.m. will not be returned until the following business day.  For prescription refill requests, have your pharmacy contact our office.         Resources For Cancer Patients and their Caregivers ? American Cancer Society: Can assist with transportation, wigs, general needs, runs Look Good Feel Better.         ([phone] 581-0561 ? Cancer Care: Provides financial assistance, online support groups, medication/co-pay assistance.  1-800-813-HOPE 662-356-1642) ? [place] Assists [place] cancer patients and their families through emotional , educational and financial support.  6106216244 ? [place] DSS Where to apply for food stamps, Medicaid and utility assistance. (863)824-5528 ? [facility]: Transportation to medical appointments. (423)522-5027 ? Social Security Administration: May apply for disability if have a Stage IV cancer. 510 854 0439 (928)103-2678 ? [employer], Disability and Transit Services: Assists with nutrition, care and transit needs. [place] Support Programs: @10RELATIVEDAYS @ > Cancer Support Group  2nd Tuesday of the month 1pm-2pm, Journey Room  > Creative Journey  3rd Tuesday of the month 1130am-1pm, Journey Room  > Look Good Feel Better  1st Wednesday of the month 10am-12 noon, Journey Room (Call [place] to register [phone])

## 2015-12-23 NOTE — Progress Notes (Signed)
Pt c/o left leg pain, sore, pain when lifting his and walking. Pt also c/o  Pain in his lower back. Pt states that he still has pain where he had surgery when raising up, pt states that when opening his left hand it is very sore and causes pain.   Pt states that he feels weak all the time. He thinks that he needs something to boost him up.   Pt given flu shot. Pt tolerated well. Pt stable and discharged home with his brother and mother.

## 2015-12-29 ENCOUNTER — Emergency Department (HOSPITAL_COMMUNITY)
Admission: EM | Admit: 2015-12-29 | Discharge: 2015-12-30 | Disposition: A | Discharge status: Home / Self Care | Payer: Medicare Other | Attending: Emergency Medicine | Admitting: Emergency Medicine

## 2015-12-29 ENCOUNTER — Encounter (HOSPITAL_COMMUNITY): Payer: Self-pay | Admitting: Emergency Medicine

## 2015-12-29 DIAGNOSIS — Z7982 Long term (current) use of aspirin: Secondary | ICD-10-CM | POA: Diagnosis not present

## 2015-12-29 DIAGNOSIS — H913 Deaf nonspeaking, not elsewhere classified: Secondary | ICD-10-CM | POA: Diagnosis not present

## 2015-12-29 DIAGNOSIS — I10 Essential (primary) hypertension: Secondary | ICD-10-CM | POA: Insufficient documentation

## 2015-12-29 DIAGNOSIS — Z79899 Other long term (current) drug therapy: Secondary | ICD-10-CM | POA: Insufficient documentation

## 2015-12-29 DIAGNOSIS — F332 Major depressive disorder, recurrent severe without psychotic features: Secondary | ICD-10-CM | POA: Diagnosis not present

## 2015-12-29 DIAGNOSIS — R45851 Suicidal ideations: Secondary | ICD-10-CM

## 2015-12-29 DIAGNOSIS — Z8249 Family history of ischemic heart disease and other diseases of the circulatory system: Secondary | ICD-10-CM | POA: Diagnosis not present

## 2015-12-29 DIAGNOSIS — Z87891 Personal history of nicotine dependence: Secondary | ICD-10-CM | POA: Diagnosis not present

## 2015-12-29 DIAGNOSIS — J45909 Unspecified asthma, uncomplicated: Secondary | ICD-10-CM | POA: Insufficient documentation

## 2015-12-29 DIAGNOSIS — Z833 Family history of diabetes mellitus: Secondary | ICD-10-CM | POA: Diagnosis not present

## 2015-12-29 DIAGNOSIS — E119 Type 2 diabetes mellitus without complications: Secondary | ICD-10-CM | POA: Diagnosis not present

## 2015-12-29 LAB — COMPREHENSIVE METABOLIC PANEL
ALBUMIN: 3.8 g/dL (ref 3.5–5.0)
ALK PHOS: 77 U/L (ref 38–126)
ALT: 23 U/L (ref 17–63)
AST: 24 U/L (ref 15–41)
Anion gap: 3 — ABNORMAL LOW (ref 5–15)
BUN: 9 mg/dL (ref 6–20)
CALCIUM: 8.9 mg/dL (ref 8.9–10.3)
CO2: 30 mmol/L (ref 22–32)
CREATININE: 1.08 mg/dL (ref 0.61–1.24)
Chloride: 103 mmol/L (ref 101–111)
GFR calc Af Amer: >60 mL/min (ref >60)
GFR calc non Af Amer: >60 mL/min (ref >60)
GLUCOSE: 127 mg/dL — AB (ref 65–99)
Potassium: 4.1 mmol/L (ref 3.5–5.1)
SODIUM: 136 mmol/L (ref 135–145)
Total Bilirubin: 0.3 mg/dL (ref 0.3–1.2)
Total Protein: 6.9 g/dL (ref 6.5–8.1)

## 2015-12-29 LAB — CBC
HEMATOCRIT: 36.1 % — AB (ref 39.0–52.0)
HEMOGLOBIN: 12.3 g/dL — AB (ref 13.0–17.0)
MCH: 29.6 pg (ref 26.0–34.0)
MCHC: 34.1 g/dL (ref 30.0–36.0)
MCV: 87 fL (ref 78.0–100.0)
Platelets: 125 10*3/uL — ABNORMAL LOW (ref 150–400)
RBC: 4.15 MIL/uL — ABNORMAL LOW (ref 4.22–5.81)
RDW: 12.8 % (ref 11.5–15.5)
WBC: 5.8 10*3/uL (ref 4.0–10.5)

## 2015-12-29 LAB — RAPID URINE DRUG SCREEN, HOSP PERFORMED
Amphetamines: NOT DETECTED
BARBITURATES: NOT DETECTED
BENZODIAZEPINES: NOT DETECTED
COCAINE: NOT DETECTED
OPIATES: NOT DETECTED
TETRAHYDROCANNABINOL: NOT DETECTED

## 2015-12-29 LAB — ETHANOL: Alcohol, Ethyl (B): <5 mg/dL (ref <5)

## 2015-12-29 MED ORDER — METFORMIN HCL 500 MG PO TABS
500.0000 mg | ORAL_TABLET | Freq: Two times a day (BID) | ORAL | Status: DC
  Administered 2015-12-30: 500 mg via ORAL
  Filled 2015-12-29: qty 1

## 2015-12-29 MED ORDER — MOMETASONE FURO-FORMOTEROL FUM 100-5 MCG/ACT IN AERO
INHALATION_SPRAY | RESPIRATORY_TRACT | Status: AC
  Filled 2015-12-29: qty 8.8

## 2015-12-29 MED ORDER — SITAGLIPTIN PHOS-METFORMIN HCL 50-500 MG PO TABS: 1.0000 | ORAL_TABLET | Freq: Two times a day (BID) | ORAL | Status: DC

## 2015-12-29 MED ORDER — OXYBUTYNIN CHLORIDE ER 5 MG PO TB24
15.0000 mg | ORAL_TABLET | Freq: Every day | ORAL | Status: DC
Start: 2015-12-29 — End: 2015-12-30
  Administered 2015-12-29: 15 mg via ORAL
  Filled 2015-12-29 (×3): qty 3

## 2015-12-29 MED ORDER — PANTOPRAZOLE SODIUM 40 MG PO TBEC
80.0000 mg | DELAYED_RELEASE_TABLET | Freq: Every day | ORAL | Status: DC
  Administered 2015-12-30: 80 mg via ORAL
  Filled 2015-12-29: qty 2

## 2015-12-29 MED ORDER — GABAPENTIN 300 MG PO CAPS
300.0000 mg | ORAL_CAPSULE | Freq: Three times a day (TID) | ORAL | Status: DC
  Administered 2015-12-29 – 2015-12-30 (×2): 300 mg via ORAL
  Filled 2015-12-29 (×2): qty 1

## 2015-12-29 MED ORDER — ATORVASTATIN CALCIUM 10 MG PO TABS
10.0000 mg | ORAL_TABLET | Freq: Every day | ORAL | Status: DC
  Filled 2015-12-29 (×3): qty 1

## 2015-12-29 MED ORDER — LINAGLIPTIN 5 MG PO TABS
5.0000 mg | ORAL_TABLET | Freq: Every day | ORAL | Status: DC
  Filled 2015-12-29 (×3): qty 1

## 2015-12-29 MED ORDER — CLOPIDOGREL BISULFATE 75 MG PO TABS
75.0000 mg | ORAL_TABLET | Freq: Every day | ORAL | Status: DC
  Administered 2015-12-29 – 2015-12-30 (×2): 75 mg via ORAL
  Filled 2015-12-29 (×2): qty 1

## 2015-12-29 MED ORDER — FUROSEMIDE 40 MG PO TABS
20.0000 mg | ORAL_TABLET | Freq: Every day | ORAL | Status: DC
  Administered 2015-12-29 – 2015-12-30 (×2): 20 mg via ORAL
  Filled 2015-12-29 (×2): qty 1

## 2015-12-29 MED ORDER — OXYBUTYNIN CHLORIDE ER 5 MG PO TB24
ORAL_TABLET | ORAL | Status: AC
  Filled 2015-12-29: qty 3

## 2015-12-29 MED ORDER — MOMETASONE FURO-FORMOTEROL FUM 100-5 MCG/ACT IN AERO
2.0000 | INHALATION_SPRAY | Freq: Two times a day (BID) | RESPIRATORY_TRACT | Status: DC
  Filled 2015-12-29: qty 8.8

## 2015-12-29 MED ORDER — ASPIRIN EC 325 MG PO TBEC
325.0000 mg | DELAYED_RELEASE_TABLET | Freq: Every day | ORAL | Status: DC
  Administered 2015-12-29 – 2015-12-30 (×2): 325 mg via ORAL
  Filled 2015-12-29 (×2): qty 1

## 2015-12-29 NOTE — ED Triage Notes (Signed)
PT is deaf and comes from [place] with interpreter that they have hired. PT states SI thoughts with no particular plan over the past week. PT denies any substance abuse or HI and denies any visual hallucinations.

## 2015-12-29 NOTE — ED Notes (Signed)
Pt has arrived here via Adult Protective Services, [place] is his representative for APS; She states she has been involved in his case x 1 week.  She states pt has some MR and is deaf and has left sided weakness and memory loss due to a stroke; pt lives at home with his mom who makes his life very stressful; the mom fusses at the patient a lot and the pt feels depressed;  Pt has had in his possession a knife that the mother has taken away.  The pt has made the comment to others that he would walk out into traffic.  Collet[place] has left her number, [phone] she will call in the morning to check in on pt.    The patient has a therapist, Maylon Peppers, MSW and she is a Transport planner For the Deaf & Hard of [employer]. She has been working with the patient and needs to be notified of his condition, if and when he is admitted. Her number is [phone] (cell text only), fax number for any medical records if [phone].

## 2015-12-29 NOTE — ED Notes (Addendum)
Pt was wanded by security and belongings placed in secure locker.
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
## 2015-12-30 DIAGNOSIS — F332 Major depressive disorder, recurrent severe without psychotic features: Secondary | ICD-10-CM | POA: Diagnosis not present

## 2015-12-30 DIAGNOSIS — Z833 Family history of diabetes mellitus: Secondary | ICD-10-CM

## 2015-12-30 DIAGNOSIS — Z87891 Personal history of nicotine dependence: Secondary | ICD-10-CM

## 2015-12-30 DIAGNOSIS — H913 Deaf nonspeaking, not elsewhere classified: Secondary | ICD-10-CM | POA: Diagnosis not present

## 2015-12-30 DIAGNOSIS — R45851 Suicidal ideations: Secondary | ICD-10-CM | POA: Insufficient documentation

## 2015-12-30 DIAGNOSIS — Z7982 Long term (current) use of aspirin: Secondary | ICD-10-CM

## 2015-12-30 DIAGNOSIS — Z8249 Family history of ischemic heart disease and other diseases of the circulatory system: Secondary | ICD-10-CM

## 2015-12-30 LAB — CBG MONITORING, ED: GLUCOSE-CAPILLARY: 107 mg/dL — AB (ref 65–99)

## 2015-12-30 MED ORDER — KETOROLAC TROMETHAMINE 10 MG PO TABS: 10.0000 mg | ORAL_TABLET | Freq: Once | ORAL | Status: DC

## 2015-12-30 MED ORDER — TRAMADOL HCL 50 MG PO TABS
50.0000 mg | ORAL_TABLET | Freq: Once | ORAL | Status: AC
  Administered 2015-12-30: 50 mg via ORAL
  Filled 2015-12-30: qty 1

## 2015-12-30 NOTE — Consult Note (Signed)
Telepsych Consultation   Reason for Consult:  Suicidal Ideation Referring Physician:  EDP Patient Identification: Charles [name] MRN:  [id number] Principal Diagnosis: MDD (major depressive disorder), recurrent episode, severe ([place])   Diagnosis:   Patient Active Problem List   Diagnosis Date Noted  . Suicidal ideation [R45.851]   . Other pancytopenia ([place]) [H84.696] 12/23/2015  . Loss of weight [R63.4] 11/12/2015  . 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 Iron deficiency anemia [D50.9] 09/22/2015  . Spinal stenosis in cervical region [M48.02] 08/21/2015  . Neck pain [M54.2] 08/12/2015  . Low back pain [M54.5] 08/12/2015  . Abnormality of gait [R26.9] 08/12/2015  . Neuropathy of right upper extremity [G56.91] 06/29/2015  . Acute rhinitis [J00] 05/05/2015  . Abdominal pain, epigastric [R10.13] 04/23/2015  . Arm pain, right [M79.601] 04/23/2015  . Atypical chest pain [R07.89] 07/23/2013  . 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 Dyspepsia [R10.13] 06/13/2013  . Dysphagia [R13.10] 06/13/2013  . Headache [R51] 11/30/2012  . Solitary pulmonary nodule [R91.1] 11/20/2012  . Spastic hemiplegia affecting left nondominant side ([place]) [G81.14] 11/08/2012  . Essential hypertension, benign [I10] 10/12/2012  . COPD GOLD II with reversibility  [J44.9] 10/12/2012  . 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 CVA (cerebral vascular accident) ([place]) [I63.9] 10/03/2012  . Type II or unspecified type diabetes mellitus without mention of complication, uncontrolled [E11.65] 10/03/2012  . Sarcoidosis ([place]) [D86.9] 10/03/2012    Total Time spent with patient: 30 minutes  Subjective:   Charles [name] is a 60 y.o. male patient admitted with suicidal ideation. Pt is deaf and conversation took place with the aid of an interpreter. Pt stated he did not know why his therapist brought him to the hospital. Pt states "yesterday was the first time I met her, Charles [name], and I am not  really sure what her role is. I said to her that sometimes I feel like I want to hurt myself and she said I need to be safe so she brought me to the hospital."   HPI:  Per tele Assessment note on chart written by Faylene Kurtz, [facility] Counselor: Charles [name] is an 60 y.o. male brought into the [facility] voluntarily by his new therapist, Maylon Peppers, MSW, after pt told her he was thinking of killing himself by walking into traffic. Pt has recently been referred to Adult Protective Services and a case opened this week. Pt denies HI and AVH. Per note in pt record, pt has MR and pt was observed to lack a level of abstract reasoning and only understand and communicate on a concrete level. Pt is deaf and communicated through an interpreter supplied by APS. Pt gave permission for his sister, Charles [name], to attend the assessment and participate. Pt sts he has been thinking about killing himself for over a month. Pt sts he tried to stab himself in the stomach 1-2 months ago but, family tookm away the knife he had. Pt sts he is "tired of the fussing" and "only wants peace, fun and joy." Pt sts that his mother fusses at him "all the time." Pt has several chronic medical conditions including diabetes type II and spinal stenosis per pt record. Pt had a stroke in 2014 and had to move to [place] from [place] as a result. Pt continues to have significant left side weakness as a result. Pt sts he does not have a psychiatrist or therapist and never has had. Pt denies current alcohol or drug use having stopped "a long time ago."  Pt's BAL and UDS taken in the ED tonight are negative. Pt's symptoms of depression including sadness, fatigue, excessive guilt, decreased self esteem, tearfulness / crying spells, self isolation, lack of motivation for activities and pleasure, irritability, negative outlook, difficulty thinking & concentrating, feeling helpless and hopeless, sleep and eating disturbances. Pt has symptoms of anxiety  including intrusive thoughts, excessive worrying, restlessness and irritability along with sleep disturbances.   Pt lives with his mother. Pt sts he wants to move back to [place] so he can have "some peace and quiet." Pt sts he does not have a guardian but, seemed a bit confused by the concept. Pt's sister sts he does not have a guardian. Pt receives disability income. Pt sts he expereieces interrupted sleep having to get up to use the bathroom many times each night. Pt sts he eats "okay." Pt denies physical, sexual or verbal/emotional abuse.   Pt was dressed in scrubs. Pt was alert, cooperative and somewhat irritable. Pt stated he did not know why his new therapist had brought him to the hospital. Pt kept good eye contact and did not speak.. Pt moved in a normal manner when moving. Pt's thought process was coherent and relevant and judgement was impaired.  No indication of delusional thinking or response to internal stimuli. Pt's mood was stated as depressed and somewhat anxious and his blunted affect was congruent.  Pt was oriented x 4, to person, place, time and situation.   Today during Tele psych assessment:  Charles [name] is a 60 year old AA male who was transported to the [facility] after he made a statement to his therapist, Charles [name], that he sometimes has thoughts of wanting to hurt himself. Pt was calm and cooperative, alert & oriented x 3, denies suicidal and homicidal ideations and auditory and visual hallucinations. Pt does not appear to be responding to internal stimuli. Pt stated he does not feel that way today and has no plan to hurt himself. Pt states "when I do have those thoughts I just pray. I just want to have peace in my life instead of my mother fussing at me all the time." Pt stated his sister came to visit him last night and offered support and prayed with him. Pt lives with his mother whom he states "fusses at me all the time but I don't fuss back, I just remain quiet and let it  happen." This writer inquired if Pt gets along with his mother and his reply was "yes most of the time."  Pt stated he wants to live independently and have peace and quiet in his life.   Discussed case with Dr Dwyane Dee who recommends discharge home with outpatient mental health services.  Past Psychiatric History: Depression  Risk to Self: Suicidal Ideation: Yes-Currently Present Suicidal Intent: Yes-Currently Present Is patient at risk for suicide?: Yes Suicidal Plan?: Yes-Currently Present Specify Current Suicidal Plan:  (Plan to walk into traffic to be hit by a truck) Access to Means: Yes Specify Access to Suicidal Means:  (traffic) What has been your use of drugs/alcohol within the last 12 months?:  (none recent use; BAL & UDS negative today) How many times?:  (0) Other Self Harm Risks:  (none reported) Triggers for Past Attempts: None known Intentional Self Injurious Behavior: None Risk to Others: Homicidal Ideation: No (denies) Thoughts of Harm to Others: No (denies) Current Homicidal Intent: No Current Homicidal Plan: No Access to Homicidal Means: No (sts no access to guns & family took knives away) Identified Victim:  (  none reported) History of harm to others?: No (none reported) Assessment of Violence: None Noted Violent Behavior Description:  (na) Does patient have access to weapons?: No Criminal Charges Pending?: No Does patient have a court date: No Prior Inpatient Therapy: Prior Inpatient Therapy: No Prior Outpatient Therapy: Prior Outpatient Therapy: No Does patient have an ACCT team?: No Does patient have Intensive In-House Services?  : No Does patient have [facility] services? : No Does patient have P4CC services?: No  Past Medical History:  Past Medical History:  Diagnosis Date  . Anemia   . Asthma   . Essential hypertension, benign   . GERD (gastroesophageal reflux disease)   . Headache    migraines in the past per pt  . Hearing impaired   . History of  stroke    Spastic hemiplegia, right MCA stroke April 2014 in [place]  . Mixed hyperlipidemia   . Other pancytopenia ([employer]) 12/23/2015  . PAD (peripheral artery disease) ([place])   . Sarcoidosis ([place])   . Stroke [facility])    2014, left sided weakness  . Type 2 diabetes mellitus ([place])     Past Surgical History:  Procedure Laterality Date  . BRAVO PH STUDY N/A 06/18/2013   pH STUDY SHOWS NEXIUM TWICE DAILY CONTROLS THE ACID IN HIS STOMACH. HE HAD VERY FEWEPISODE OF REGURGITATION RECORDED IN THE 2 DAYS THE STUDY WAS PERFORMED.   [name] COLONOSCOPY  2011   IN [place]  . 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   . ESOPHAGOGASTRODUODENOSCOPY N/A 05/05/2015   Procedure: ESOPHAGOGASTRODUODENOSCOPY (EGD);  Surgeon: Danie Binder, MD;  [facility];  Service: Endoscopy;  Laterality: N/A;  730  . MALONEY DILATION N/A 06/18/2013   Procedure: MALONEY DILATION;  Surgeon: Danie Binder, MD;  Location: [facility];  Service: Endoscopy;  Laterality: N/A;  . OTHER SURGICAL HISTORY     cyst removal head and chest  . POSTERIOR CERVICAL FUSION/FORAMINOTOMY N/A 10/07/2015   Procedure: Cervical Three-Four, Cervical Four-Five, Cervical Five-Six, Cervical Six-Seven Posterior Cervical Laminectomy and Fusion;  Surgeon: Kary Kos, MD;  Location: [facility]URO ORS;  Service: Neurosurgery;  Laterality: N/A;  . SAVORY DILATION N/A 06/18/2013   Procedure: SAVORY DILATION;  Surgeon: Danie Binder, MD;  Location: [facility];  Service: Endoscopy;  Laterality: N/A;  . TEE WITHOUT CARDIOVERSION N/A 10/04/2012   Procedure: TRANSESOPHAGEAL ECHOCARDIOGRAM (TEE);  Surgeon: Thayer Headings, MD;  Location: [facility] ENDOSCOPY;  Service:  Cardiovascular;  Laterality: N/A;  . [place] accident  Both legs fractured and repaired surgically   Family History:  Family History  Problem Relation Age of Onset  . Diabetes Mother   . Hypertension Mother   . Diabetes Father   . Hypertension Father   . Colon polyps Neg Hx   . Colon cancer Neg Hx    Family Psychiatric  History: unknown Social History:  History  Alcohol Use  . 0.0 oz/week    Comment: Socially     History  Drug Use    Comment: hx of crack cocaine "long time ago" per pt    Social History   Social History  . Marital status: Single    Spouse name: N/A  . Number of children: 0  .  Years of education: HS   Occupational History  . UNEMPLOYED    Social History Main Topics  . Smoking status: Former Smoker    Packs/day: 1.00    Years: 29.00    Types: Cigarettes    Quit date: 02/22/1992  . Smokeless tobacco: Never Used  . Alcohol use 0.0 oz/week     Comment: Socially  . Drug use:      Comment: hx of crack cocaine "long time ago" per pt  . Sexual activity: No   Other Topics Concern  . None   Social History Narrative   Patient lives at home with his family. Brother, sister, mother    Patient does not work.    Patient has a high school education.    Patient has one step child    Additional Social History:    Allergies:   Allergies  Allergen Reactions  . Shellfish Allergy Hives and Swelling    SWELLING REACTION UNSPECIFIED   . Hydrocodone Other (See Comments)    Makes "looney"  . Contrast Media [Iodinated Diagnostic Agents] Itching  . Oxycodone Nausea And Vomiting    Labs:  Results for orders placed or performed during the hospital encounter of 12/29/15 (from the past 48 hour(s))  Rapid urine drug screen (hospital performed)     Status: None   Collection Time: 12/29/15  6:32 PM  Result Value Ref Range   Opiates NONE DETECTED NONE DETECTED   Cocaine NONE DETECTED NONE DETECTED   Benzodiazepines NONE DETECTED  NONE DETECTED   Amphetamines NONE DETECTED NONE DETECTED   Tetrahydrocannabinol NONE DETECTED NONE DETECTED   Barbiturates NONE DETECTED NONE DETECTED    Comment:        DRUG SCREEN FOR MEDICAL PURPOSES ONLY.  IF CONFIRMATION IS NEEDED FOR ANY PURPOSE, NOTIFY LAB WITHIN 5 DAYS.        LOWEST DETECTABLE LIMITS FOR URINE DRUG SCREEN Drug Class       Cutoff (ng/mL) Amphetamine      1000 Barbiturate      200 Benzodiazepine   093 Tricyclics       818 Opiates          300 Cocaine          300 THC              50   Comprehensive metabolic panel     Status: Abnormal   Collection Time: 12/29/15  8:39 PM  Result Value Ref Range   Sodium 136 135 - 145 mmol/L   Potassium 4.1 3.5 - 5.1 mmol/L   Chloride 103 101 - 111 mmol/L   CO2 30 22 - 32 mmol/L   Glucose, Bld 127 (H) 65 - 99 mg/dL   BUN 9 6 - 20 mg/dL   Creatinine, Ser 1.08 0.61 - 1.24 mg/dL   Calcium 8.9 8.9 - 10.3 mg/dL   Total Protein 6.9 6.5 - 8.1 g/dL   Albumin 3.8 3.5 - 5.0 g/dL   AST 24 15 - 41 U/L   ALT 23 17 - 63 U/L   Alkaline Phosphatase 77 38 - 126 U/L   Total Bilirubin 0.3 0.3 - 1.2 mg/dL   GFR calc non Af Amer >60 >60 mL/min   GFR calc Af Amer >60 >60 mL/min    Comment: (NOTE) The eGFR has been calculated using the CKD EPI equation. This calculation has not been validated in all clinical situations. eGFR's persistently <60 mL/min signify possible Chronic Kidney Disease.    Anion gap 3 (  L) 5 - 15  Ethanol     Status: None   Collection Time: 12/29/15  8:39 PM  Result Value Ref Range   Alcohol, Ethyl (B) <5 <5 mg/dL    Comment:        LOWEST DETECTABLE LIMIT FOR SERUM ALCOHOL IS 5 mg/dL FOR MEDICAL PURPOSES ONLY   cbc     Status: Abnormal   Collection Time: 12/29/15  8:39 PM  Result Value Ref Range   WBC 5.8 4.0 - 10.5 K/uL   RBC 4.15 (L) 4.22 - 5.81 MIL/uL   Hemoglobin 12.3 (L) 13.0 - 17.0 g/dL   HCT 36.1 (L) 39.0 - 52.0 %   MCV 87.0 78.0 - 100.0 fL   MCH 29.6 26.0 - 34.0 pg   MCHC 34.1 30.0 - 36.0  g/dL   RDW 12.8 11.5 - 15.5 %   Platelets 125 (L) 150 - 400 K/uL  CBG monitoring, ED     Status: Abnormal   Collection Time: 12/30/15  7:20 AM  Result Value Ref Range   Glucose-Capillary 107 (H) 65 - 99 mg/dL    Current Facility-Administered Medications  Medication Dose Route Frequency Provider Last Rate Last Dose  . aspirin EC tablet 325 mg  325 mg Oral Daily Francine Graven, DO   325 mg at 12/29/15 2230  . atorvastatin (LIPITOR) tablet 10 mg  10 mg Oral q1800 Francine Graven, DO      . botulinum toxin Type A (BOTOX) injection 300 Units  300 Units Intramuscular Once Drema Dallas, DO      . clopidogrel (PLAVIX) tablet 75 mg  75 mg Oral Daily Francine Graven, DO   75 mg at 12/29/15 2230  . furosemide (LASIX) tablet 20 mg  20 mg Oral Daily Francine Graven, DO   20 mg at 12/29/15 2230  . gabapentin (NEURONTIN) capsule 300 mg  300 mg Oral TID Francine Graven, DO   300 mg at 12/29/15 2240  . metFORMIN (GLUCOPHAGE) tablet 500 mg  500 mg Oral BID WC Francine Graven, DO   500 mg at 12/30/15 0720   And  . linagliptin (TRADJENTA) tablet 5 mg  5 mg Oral Q breakfast Francine Graven, DO      . mometasone-formoterol [facility]) 100-5 MCG/ACT inhaler 2 puff  2 puff Inhalation Q12H Francine Graven, DO      . oxybutynin (DITROPAN-XL) 24 hr tablet 15 mg  15 mg Oral QHS Francine Graven, DO   15 mg at 12/29/15 2240  . pantoprazole (PROTONIX) EC tablet 80 mg  80 mg Oral Q1200 Francine Graven, DO       Current Outpatient Prescriptions  Medication Sig Dispense Refill  . acetaminophen (TYLENOL) 650 MG CR tablet Take 650 mg by mouth every 8 (eight) hours as needed for pain.    [name] aspirin 325 MG EC tablet Take 325 mg by mouth daily.    [name] atorvastatin (LIPITOR) 10 MG tablet Take 10 mg by mouth daily. (cholesterol)    . budesonide-formoterol (SYMBICORT) 80-4.5 MCG/ACT inhaler Inhale 2 puffs into the lungs 2 (two) times daily as needed (shortness of breath/ wheezing).    . clopidogrel (PLAVIX) 75 MG tablet  Take 75 mg by mouth daily.    [name] esomeprazole (NEXIUM) 40 MG capsule Take 1 capsule (40 mg total) by mouth 2 (two) times daily. 60 capsule 0  . fluticasone furoate-vilanterol (BREO ELLIPTA) 100-25 MCG/INH AEPB Inhale 1 puff into the lungs as needed.     . furosemide (LASIX) 20 MG tablet Take  20 mg by mouth daily.     [name] gabapentin (NEURONTIN) 300 MG capsule Take 1 capsule (300 mg total) by mouth 3 (three) times daily. 90 capsule 11  . meloxicam (MOBIC) 7.5 MG tablet   1  . mometasone-formoterol (DULERA) 100-5 MCG/ACT AERO Take 2 puffs first thing in am and then another 2 puffs about 12 hours later. (Patient taking differently: Inhale 2 puffs into the lungs every 12 (twelve) hours. ) 1 Inhaler 11  . naproxen (NAPROSYN) 500 MG tablet Take 500 mg by mouth 2 (two) times daily with a meal.  0  . NEXIUM 40 MG capsule take 1 capsule by mouth 30 MINUTES PRIOR TO MEALS----TAKE TWICE A DAY 60 capsule 11  . oxybutynin (DITROPAN XL) 15 MG 24 hr tablet Take 15 mg by mouth at bedtime.    . polyethylene glycol powder (GLYCOLAX/MIRALAX) powder Take 17 g by mouth at bedtime as needed for mild constipation. Mix in 8 oz liquid and drink    . predniSONE (DELTASONE) 1 MG tablet Take 1 mg by mouth daily with breakfast.    . sitaGLIPtin-metformin (JANUMET) 50-500 MG tablet Take 1 tablet by mouth 2 (two) times daily with a meal.    . traMADol (ULTRAM) 50 MG tablet Take 1 tablet (50 mg total) by mouth every 8 (eight) hours as needed. (Patient taking differently: Take 50 mg by mouth every 8 (eight) hours as needed (pain). ) 60 tablet 3    Musculoskeletal: Unable to assess: camera  Psychiatric Specialty Exam: Physical Exam  Review of Systems  Psychiatric/Behavioral: Positive for depression and suicidal ideas (passive without a plan). Negative for hallucinations, memory loss and substance abuse. The patient is not nervous/anxious and does not have insomnia.     Blood pressure 124/75, pulse 78, temperature 98.1 F (36.7  C), resp. rate 18, height '5\' 4"'$  (1.626 m), weight 65.3 kg (144 lb), SpO2 100 %.Body mass index is 24.72 kg/m.  General Appearance: Casual and Fairly Groomed  Eye Contact:  Good  Speech:  deaf, speaks via interpretor  Volume:  sign language via interpretor  Mood:  Depressed  Affect:  Congruent and Depressed  Thought Process:  Coherent, Goal Directed and Linear  Orientation:  Full (Time, Place, and Person)  Thought Content:  Logical  Suicidal Thoughts:  Yes.  without intent/plan  Homicidal Thoughts:  No  Memory:  Immediate;   Good Recent;   Good  Judgement:  Fair  Insight:  Good  Psychomotor Activity:  Normal  Concentration:  Concentration: Good and Attention Span: Good  Recall:  Good  Fund of Knowledge:  Good  Language:  sign language speaks through interpretor  Akathisia:  No  Handed:  Right  AIMS (if indicated):     Assets:  Communication Skills Desire for Improvement Financial Resources/Insurance Housing Social Support Transportation  ADL's:  Intact  Cognition:  WNL  Sleep:        Treatment Plan Summary: MDD (major depressive disorder), recurrent episode, severe ([place]) Follow up with therapist   Disposition: No evidence of imminent risk to self or others at present.   Patient does not meet criteria for psychiatric inpatient admission.  Discharge home.  Ethelene Hal, NP 12/30/2015 10:19 AM

## 2015-12-30 NOTE — ED Notes (Addendum)
interrupter here. TTS in progress

## 2015-12-30 NOTE — ED Notes (Signed)
Pt given meal at this time.

## 2015-12-30 NOTE — BH Assessment (Addendum)
Tele Assessment Note   Charles [name] is an 60 y.o. male brought into the [facility] tonight voluntarily by his new therapist, Maylon Peppers, MSW, after pt told her he was thinking of killing himself by walking into traffic. Pt has recently been referred to Adult Protective Services and a case opened this week. Pt denies HI and AVH. Per note in pt record, pt has MR and pt was observed to lack a level of abstract reasoning and only understand and communicate on a concrete level. Pt is deaf and communicated through an interpreter supplied by APS. Pt gave permission for his sister, [name], to attend the assessment and participate. Pt sts he has been thinking about killing himself for over a month. Pt sts he tried to stab himself in the stomach 1-2 months ago but, family tookm away the knife he had. Pt sts he is "tired of the fussing" and "only wants peace, fun and joy." Pt sts that his mother fusses at him "all the time." Pt has several chronic medical conditions including diabetes type II and spinal stenosis per pt record. Pt had a stroke in 2014 and had to move to [place] from [place] as a result. Pt continues to have significant left side weakness as a result. Pt sts he does not have a psychiatrist or therapist and never has had. Pt denies current alcohol or drug use having stopped "a long time ago."  Pt's BAL and UDS taken in the ED tonight are negative. Pt's symptoms of depression including sadness, fatigue, excessive guilt, decreased self esteem, tearfulness / crying spells, self isolation, lack of motivation for activities and pleasure, irritability, negative outlook, difficulty thinking & concentrating, feeling helpless and hopeless, sleep and eating disturbances. Pt has symptoms of anxiety including intrusive thoughts, excessive worrying, restlessness and irritability along with sleep disturbances.   Pt lives with his mother. Pt sts he wants to move back to [place] so he can have "some peace and  quiet." Pt sts he does not have a guardian but, seemed a bit confused by the concept. Pt's sister sts he does not have a guardian. Pt receives disability income. Pt sts he expereieces interrupted sleep having to get up to use the bathroom many times each night. Pt sts he eats "okay." Pt denies physical, sexual or verbal/emotional abuse.   Pt was dressed in scrubs. Pt was alert, cooperative and somewhat irritable. Pt stated he did not know why his new therapist had brought him to the hospital. Pt kept good eye contact and did not speak.. Pt moved in a normal manner when moving. Pt's thought process was coherent and relevant and judgement was impaired.  No indication of delusional thinking or response to internal stimuli. Pt's mood was stated as depressed and somewhat anxious and his blunted affect was congruent.  Pt was oriented x 4, to person, place, time and situation.  Stated as Diagnosis: MDD, Recurrent, Severe  Past Medical History:  Past Medical History:  Diagnosis Date  . Anemia   . Asthma   . Essential hypertension, benign   . GERD (gastroesophageal reflux disease)   . Headache    migraines in the past per pt  . Hearing impaired   . History of stroke    Spastic hemiplegia, right MCA stroke April 2014 in [place]  . Mixed hyperlipidemia   . Other pancytopenia ([place]) 12/23/2015  . PAD (peripheral artery disease) ([place])   . Sarcoidosis ([place])   . Stroke [facility])    2014, left sided  weakness  . Type 2 diabetes mellitus ([place])     Past Surgical History:  Procedure Laterality Date  . BRAVO PH STUDY N/A 06/18/2013   pH STUDY SHOWS NEXIUM TWICE DAILY CONTROLS THE ACID IN HIS STOMACH. HE HAD VERY FEWEPISODE OF REGURGITATION RECORDED IN THE 2 DAYS THE STUDY WAS PERFORMED.   [name] COLONOSCOPY  2011   IN [place]  . ESOPHAGOGASTRODUODENOSCOPY N/A 06/18/2013   Dr.Fields- probable proximal esophageal web,dilation performed, bravo cap placed, mild non-erosive gastritis in the gastric antrum and on the  greater curvature of the gastric body bx- granulomatous gastritis, duodenal mucosa showed no abnormalities in the bulb and second portion of the duodenum.   . ESOPHAGOGASTRODUODENOSCOPY N/A 05/05/2015   Procedure: ESOPHAGOGASTRODUODENOSCOPY (EGD);  Surgeon: Danie Binder, MD;  [facility];  Service: Endoscopy;  Laterality: N/A;  730  . MALONEY DILATION N/A 06/18/2013   Procedure: MALONEY DILATION;  Surgeon: Danie Binder, MD;  [facility];  Service: Endoscopy;  Laterality: N/A;  . OTHER SURGICAL HISTORY     cyst removal head and chest  . POSTERIOR CERVICAL FUSION/FORAMINOTOMY N/A 10/07/2015   Procedure: Cervical Three-Four, Cervical Four-Five, Cervical Five-Six, Cervical Six-Seven Posterior Cervical Laminectomy and Fusion;  Surgeon: Kary Kos, MD;  Location: [facility] NEURO ORS;  Service: Neurosurgery;  Laterality: N/A;  . SAVORY DILATION N/A 06/18/2013   Procedure: SAVORY DILATION;  Surgeon: Danie Binder, MD;  [facility];  Service: Endoscopy;  Laterality: N/A;  . TEE WITHOUT CARDIOVERSION N/A 10/04/2012   Procedure: TRANSESOPHAGEAL ECHOCARDIOGRAM (TEE);  Surgeon: Thayer Headings, MD;  Location[facility] END[facility]COPY;  Service: Cardiovascular;  Laterality: N/A;  . [place] accident  Both legs fractured and repaired surgically    Family History:  Family History  Problem Relation Age of Onset  . Diabetes Mother   . Hypertension Mother   . Diabetes Father   . Hypertension Father   . Colon polyps Neg Hx   . Colon cancer Neg Hx     Social History:  reports that he quit smoking about 23 years ago. His smoking use included Cigarettes. He has a 29.00 pack-year smoking history. He has never used smokeless tobacco. He reports that he drinks alcohol. He reports that he uses drugs.  Additional Social History:  Alcohol / Drug Use Prescriptions: see MAR History of alcohol / drug use?: Yes Longest period of sobriety (when/how long):  unknown Substance #1 Name of Substance 1: Alcohol 1 - Age of First Use: unk 1 - Amount (size/oz): unk 1 - Frequency: unk 1 - Duration: unk 1 - Last Use / Amount: stopped "long time ago" Substance #2 Name of Substance 2: Cannabis 2 - Age of First Use: unk 2 - Amount (size/oz): unk 2 - Frequency: unk 2 - Duration: unk 2 - Last Use / Amount: stopped "long time ago" Substance #3 Name of Substance 3: Tobacco 3 - Age of First Use: unk 3 - Amount (size/oz): unk 3 - Frequency: unk 3 - Duration: unk 3 - Last Use / Amount: stopped 02/22/92  CIWA: CIWA-Ar BP: 154/89 Pulse Rate: 89 COWS:    PATIENT STRENGTHS: (choose at least two) Religious Affiliation Supportive family/friends  Allergies:  Allergies  Allergen Reactions  . Shellfish Allergy Hives and Swelling    SWELLING REACTION UNSPECIFIED   . Hydrocodone Other (See Comments)    Makes "looney"  . Contrast Media [Iodinated Diagnostic Agents] Itching  . Oxycodone Nausea And Vomiting  Home Medications:  (Not in a hospital admission)  OB/GYN Status:  No LMP for male patient.  General Assessment Data [facility] of Assessment: [facility] TTS Assessment: In system Is this a Tele or Face-to-Face Assessment?: Tele Assessment Is this an Initial Assessment or a Re-assessment for this encounter?: Initial Assessment Marital status: Single Living Arrangements: Parent (lives w his mother) Can pt return to current living arrangement?: Yes Admission Status: Voluntary Is patient capable of signing voluntary admission?: Yes Referral Source: Other (Therapist, Maylon Peppers, MSW) Insurance type:  (Medicare)  Medical Screening Exam ([place]) Medical Exam completed: Yes  Crisis Care Plan Living Arrangements: Parent (lives w his mother) Legal Guardian:  (self) Name of Psychiatrist:  (none) Name of Therapist:  (New therapist as of 12/29/15 Maylon Peppers, MSW [phone])  Education Status Is patient currently in school?: No Highest  grade of school patient has completed:  (unk)  Risk to self with the past 6 months Suicidal Ideation: Yes-Currently Present Has patient been a risk to self within the past 6 months prior to admission? : Yes Suicidal Intent: Yes-Currently Present Has patient had any suicidal intent within the past 6 months prior to admission? : Yes Is patient at risk for suicide?: Yes Suicidal Plan?: Yes-Currently Present Has patient had any suicidal plan within the past 6 months prior to admission? : Yes Specify Current Suicidal Plan:  (Plan to walk into traffic to be hit by a truck) Access to Means: Yes Specify Access to Suicidal Means:  (traffic) What has been your use of drugs/alcohol within the last 12 months?:  (none recent use; BAL & UDS negative today) Previous Attempts/Gestures: No (none reported) How many times?:  (0) Other Self Harm Risks:  (none reported) Triggers for Past Attempts: None known Intentional Self Injurious Behavior: None Family Suicide History: Unknown Recent stressful life event(s): Conflict (Comment), Recent negative physical changes Persecutory voices/beliefs?:  (Conflict w mom & brother; Disability from CVA) Depression: Yes Depression Symptoms: Insomnia, Tearfulness, Isolating, Fatigue, Guilt, Loss of interest in usual pleasures, Feeling worthless/self pity, Feeling angry/irritable Suicide prevention information given to non-admitted patients: Not applicable  Risk to Others within the past 6 months Homicidal Ideation: No (denies) Does patient have any lifetime risk of violence toward others beyond the six months prior to admission? : No (none reported) Thoughts of Harm to Others: No (denies) Current Homicidal Intent: No Current Homicidal Plan: No Access to Homicidal Means: No (sts no access to guns & family took knives away) Identified Victim:  (none reported) History of harm to others?: No (none reported) Assessment of Violence: None Noted Violent Behavior  Description:  (na) Does patient have access to weapons?: No Criminal Charges Pending?: No Does patient have a court date: No Is patient on probation?: No  Psychosis Hallucinations: None noted (denies) Delusions: None noted  Mental Status Report Appearance/Hygiene: Unremarkable, In scrubs Eye Contact: Good Motor Activity: Freedom of movement (significant weakness on left side due to stroke) Speech: Other (Comment) (Deaf & Mute) Level of Consciousness: Alert Mood: Depressed, Irritable Affect: Anxious, Blunted, Depressed Anxiety Level: Minimal Thought Processes: Coherent, Relevant Judgement: Impaired Orientation: Person, Place, Time, Situation Obsessive Compulsive Thoughts/Behaviors: None (none reported)  Cognitive Functioning Concentration: Decreased Memory: Recent Intact, Remote Intact IQ:  (Uncertain; Per pt record per APS worker IDD/MR) Insight: Poor Impulse Control: Fair Appetite: Good Sleep: Decreased Total Hours of Sleep:  (could not estimate) Vegetative Symptoms: None (none reported)  ADLScreening [facility] Assessment Services) Patient's cognitive ability adequate to safely complete daily activities?:  (  Uncertain) Patient able to express need for assistance with ADLs?: Yes Independently performs ADLs?: No (Barriers to balance and ambulation; Left side weak -CVA)  Prior Inpatient Therapy Prior Inpatient Therapy: No  Prior Outpatient Therapy Prior Outpatient Therapy: No Does patient have an ACCT team?: No Does patient have Intensive In-House Services?  : No Does patient have [facility] services? : No Does patient have P4CC services?: No  ADL Screening (condition at time of admission) Patient's cognitive ability adequate to safely complete daily activities?:  (Uncertain) Patient able to express need for assistance with ADLs?: Yes Independently performs ADLs?: No (Barriers to balance and ambulation; Left side weak -CVA)       Abuse/Neglect Assessment (Assessment to  be complete while patient is alone) Physical Abuse: Denies Verbal Abuse: Yes, present (Comment) (pt describes continuing verball "fussing" at him by mother resulting in SI; Possibly Verbal Abuse) Sexual Abuse: Denies Exploitation of patient/patient's resources: Denies Self-Neglect: Denies     Regulatory affairs officer (For Healthcare) Does patient have an advance directive?: No Would patient like information on creating an advanced directive?: No - patient declined information    Additional Information 1:1 In Past 12 Months?: No CIRT Risk: No Elopement Risk: No Does patient have medical clearance?: Yes     Disposition:  Disposition Initial Assessment Completed for this Encounter: Yes Disposition of Patient: Other dispositions (Per Patriciaann Clan, PA: Observe tonight-AM psych re-eval) Other disposition(s): Other (Comment)  Per Patriciaann Clan, PA: Recommend observe pt overnight & re-evaluation during AM shift by psychiatry for final disposition.  Spoke with Dr. Leonides Schanz, [place], at [place] of recommendation.   Faylene Kurtz, MS, CRC, [place] Triage Specialist [facility] T 12/30/2015 12:38 AM

## 2015-12-30 NOTE — ED Notes (Signed)
Pt requesting shower at this time, charge nurse notified.

## 2015-12-30 NOTE — ED Notes (Signed)
D/c instructions review with use of ASL interpretor. Pt denies any SI/HI at this time and repeatedly states "I'm going to pray and go to church, I think that will make me happier". Pt instructed to seek immediate care if SI/HI thoughts return. Pt denied any questions at this time. Family member, brother [name], to pick pt up.

## 2015-12-30 NOTE — ED Notes (Signed)
Spoke with Jinny Blossom, Social Worker with [facility], and was told she spoke to APS about pt and ok for pt's brother, yezen, landman pick up pt from hospital and for pt to go back home with his mother.

## 2015-12-30 NOTE — Progress Notes (Signed)
Pt was re-evaluated via psychiatry- recommend pt does not meet inpatient criteria and advise d/c to continue working with providers in the community. Spoke with pt's APS worker Tyron Russell [phone]. She states plan for d/c will be return home (lives with mother), states, "there is no emergency placement for him at this time- we will continue monitoring the home situation and working with him and the family going forward." States that pt's therapist Maylon Peppers, [phone], is deaf and uses sign language only, advises she may not be available to speak with CSW via phone. States she will inform therapist of events of today and that if therapist needs addt'l information will have her contact.  Advises pt's "brother Cleto [phone] would be the family member to contact about transporting him back home."  Sharren Bridge, MSW, [place] Work, Disposition  12/30/2015 [phone]

## 2015-12-30 NOTE — Discharge Instructions (Signed)
Follow up with out pt tx for depression as instructed by behavior health

## 2015-12-30 NOTE — Progress Notes (Addendum)
Reviewed pt's chart. Pt was seen by TTS last night and recommended to be seen again today by psychiatry. Called pt's APS worker Charles [name] [phone]- she states that she just became involved with patient last week as APS was contacted b/c "Charles [name] has had a stroke and has left sided weakness, is also deaf and was receiving physical/occupational therapy, however his mother, whom he lives with, won't let him access it, is throwing him out of the home." She states, "Evidently Charles [name] made a statement that he was suicidal and wanted to stab himself last week but his mother wouldn't let him go to the hospital- just took the knife away." CSW asked about MR diagnosis which ED note and TTS assessment discusses. Charles [name] states, "I don't know anything about an MR diagnosis, this is the first I'm hearing about it, I don't know why anyone would say that I said that." Advises contacting pt's new therapist to question known diagnoses. Attempted reaching individual listed as pt's therapist, Charles [name], [phone]. No voicemail box set up.  Will continue following.  Sharren Bridge, MSW, LCSW Clinical Social Work, Disposition  12/30/2015 [phone]  Attempted again to call pt's therapist for collateral information [phone]. No voicemail box set up.  Discussed case with psych team and plan to have pt provided with re-evaluation today with use of ASL interpretor.

## 2015-12-30 NOTE — ED Notes (Signed)
Pt's brother at bedside, pt changing into clothing at this time. Items returned to pt and no further questions at this time.

## 2015-12-30 NOTE — ED Notes (Signed)
Called [facility] main line and several other lines, no answer at this time.

## 2015-12-30 NOTE — ED Notes (Signed)
Security, Optometrist, and Chartered certified accountant notified of pt requesting a shower.

## 2015-12-31 NOTE — Progress Notes (Signed)
Received call from pt's therapist Maylon Peppers via interpretor. Asked outcome of pt's ED encounter with CSW provided. Asked recommendations for pt- CSW provided information available from assessment, including pt continue working with outpatient providers to address depressive symptoms and with APS for monitoring of living environment.  Therapist requested copy of discharge summary for records- CSW directed call to medical records.  Sharren Bridge, MSW, LCSW Clinical Social Work, Disposition  12/31/2015 [phone]

## 2016-01-19 DIAGNOSIS — I10 Essential (primary) hypertension: Secondary | ICD-10-CM | POA: Diagnosis not present

## 2016-01-19 DIAGNOSIS — M5137 Other intervertebral disc degeneration, lumbosacral region: Secondary | ICD-10-CM | POA: Diagnosis not present

## 2016-01-19 DIAGNOSIS — M79605 Pain in left leg: Secondary | ICD-10-CM | POA: Diagnosis not present

## 2016-01-19 DIAGNOSIS — Z681 Body mass index (BMI) 19 or less, adult: Secondary | ICD-10-CM | POA: Diagnosis not present

## 2016-01-19 DIAGNOSIS — M542 Cervicalgia: Secondary | ICD-10-CM | POA: Diagnosis not present

## 2016-02-02 ENCOUNTER — Encounter: Payer: Self-pay | Admitting: Gastroenterology

## 2016-02-09 ENCOUNTER — Encounter: Payer: Self-pay | Admitting: Internal Medicine

## 2016-02-09 ENCOUNTER — Ambulatory Visit (INDEPENDENT_AMBULATORY_CARE_PROVIDER_SITE_OTHER): Payer: Medicare Other | Admitting: Internal Medicine

## 2016-02-09 VITALS — BP 160/88 | HR 68 | Ht 64.0 in | Wt 152.0 lb

## 2016-02-09 DIAGNOSIS — D869 Sarcoidosis, unspecified: Secondary | ICD-10-CM

## 2016-02-09 DIAGNOSIS — I10 Essential (primary) hypertension: Secondary | ICD-10-CM | POA: Diagnosis not present

## 2016-02-09 DIAGNOSIS — I63311 Cerebral infarction due to thrombosis of right middle cerebral artery: Secondary | ICD-10-CM | POA: Diagnosis not present

## 2016-02-09 DIAGNOSIS — J449 Chronic obstructive pulmonary disease, unspecified: Secondary | ICD-10-CM

## 2016-02-09 MED ORDER — MOMETASONE FURO-FORMOTEROL FUM 100-5 MCG/ACT IN AERO: 2.0000 | INHALATION_SPRAY | Freq: Two times a day (BID) | RESPIRATORY_TRACT | 11 refills | Status: DC

## 2016-02-09 NOTE — Assessment & Plan Note (Signed)
-   ex intol since age 60 - trial off acei 10/12/2012  - 11/19/2012   Walked RA x one lap @ 185 stopped due to  Legs gave out no desat  - trial of breo samples 11/19/2012 >> improved on breo/spiriva 04/01/2013 > try off spiriva 05/14/2013 due to dry mouth> no worse 08/09/2013  - Spirometry 11/19/2012  FEV1  1.29 (49) ratio 55  - Spirometry 10/17/2013   FEV1  1.17 (44%) ratio 55 off breo x one week and prednisone at 5 mg daily > d/c BREO as pt not convinced help - 10/17/2013   Walked RA x one lap @ 185 stopped due to fatigue not sob  - PFTs  06/13/2014  FEV1 1.47(60) ratio 53 with 30% improvement   dlco 65 improves to 109 p alv vol while on pred 10 mg daily  - 06/13/2014 p extensive coaching HFA effectiveness =    50% so try dulera 200 2 twice daily  - 11/04/2014  hfa < 25 % but pt doing well on dulera 200 so rec continue for now  - 05/04/2015  extensive coaching HFA effectiveness =    0% so no need for the high dose of dulera > try 100 2bid > no worse symptoms  08/10/2015  - 02/09/2016  After extensive coaching HFA effectiveness =    25%   He has moderate dz that has stabilized on duler a100 2bid despite suboptimal hfa technique   Since he is so exceptionally hard to communicate with and assure med reconciliation I rec no change rx as long as doing well and f/u pulmonary can be prn   I had an extended discussion with the patient via interpretor  reviewing all relevant studies completed to date and  lasting 15 to 20 minutes of a 25 minute visit    Each maintenance medication was reviewed in detail including most importantly the difference between maintenance and prns and under what circumstances the prns are to be triggered using an action plan format that is not reflected in the computer generated alphabetically organized AVS.    Please see AVS for unique instructions that I personally wrote and verbalized to the the pt in detail and then reviewed with pt  by my nurse highlighting any  changes in therapy  recommended at today's visit to their plan of care.

## 2016-02-09 NOTE — Assessment & Plan Note (Signed)
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

## 2016-02-09 NOTE — Patient Instructions (Addendum)
Plan A = Automatic = dulera 100 Take 2 puffs first thing in am and then another 2 puffs about 12 hours later.     Plan B = Backup Only use your albuterol (HFA)as a rescue medication to be used if you can't catch your breath by resting or doing a relaxed purse lip breathing pattern.  - The less you use it, the better it will work when you need it. - Ok to use the inhaler up to 2 puffs  every 4 hours if you must but call for appointment if use goes up over your usual need - Don't leave home without it !!  (think of it like the spare tire for your car)     If you are satisfied with your treatment plan,  let your doctor know and he/she can either refill your medications or you can return here when your prescription runs out.     If in any way you are not 100% satisfied,  please tell us.  If 100% better, tell your friends!  Pulmonary follow up is as needed  - in one year for refills or let Dr Manuella Ghazi refill

## 2016-02-09 NOTE — Assessment & Plan Note (Signed)
Not optimally controlled on present regimen. I reviewed this with the patient and emphasized importance of follow-up with primary care.     

## 2016-02-09 NOTE — Progress Notes (Signed)
Subjective:   Patient ID: Charles [name], male    DOB: [birth year]    MRN: [id number]  Brief patient profile:  67 yowm dx  Active smoker first aware of sob age 60 referred to pulmonary clinic 10/11/12 with dx of sarcoid and atypical sob ? acei effect extremely difficult to maintain med reconciliation as he is both deaf and very stubborn re taking care of his own meds    History of Present Illness   [name] -Interpretor  Hx of CVA w/ hemiplegia   10/11/2012  1st [facility] pulmonary eval cc short of breath ? When did you first notice it ?  Mother "well the doctor's discovered" it dx with sarcoid in [place] and placed on prednisone since decades and maintained on prednisone and prinizil.  He was able to play sports in high school but mother says ?always needed advair (note advair wasn't made until he was 65) "well he was always on some pump" (interesting comment as he showed no capacity at all to use hfa).  Thru the interpreter after mutliple back and forths and asking the mother to let him answer the question, he has been variably sob since age 27, sometimes can't get across the room s giving out but never at rest, never sleeping, assoc with dry cough ? Since when  > on ACEi  Dx and rx as sarcoid by [name] doc on pred 10 mg per day but not clear whether ever changed his breathing for the better and so maint on pred 10 mg per day  >>D/C acei and advair    05/13/2014 f/u ov/[name] re: sarcoid/ chronically steroid dep  Chief Complaint  Patient presents with  . Follow-up    Pt c/o incr SOB and dryness in chest when breathing. Pt does not feel that Prednisone is working. Pt reports changing diet some since last OV.   maintained on 10 mg daily  No worse cough  "brought all meds"  =  No proair  Pt has assumed all med management and taking ppi incorrectly and pred 10 where it was one half Pt nor fm using the med calendar format rec Please remember to go to the x-ray department downstairs  for your tests - we will call you with the results when they are available. Only use your albuterol as a rescue medication to  [facility] to double prednisone if breathing worse between now and the next visit Nexium 40 mg Take 30-60 min should be before first meal of the day     06/13/2014 f/u ov/[name] re: copd GOLD II with reversiblity / did not bring spiriva with him / did bring proair but hfa quite poor/ pred at 10 mg daily   Chief Complaint  Patient presents with  . Follow-up    PFT done today. Breathing has improved. No new co's today.     Still not taking ppi ac  >[id number]  rx    11/04/2014 f/u ov/[name] re: COPD Gold II with reversibility/ pred at 10 mg daily/ dulera on 0 didn't know it. Chief Complaint  Patient presents with  . Follow-up    Pt states that his breathing is unchanged since the last visit. He is using rescue inhaler   limited more by back than breathing' very poor hfa/ dulera completely empty, pt did not know when the counter went to 0 rec No change in medications Work on inhaler technique:    You will need to let your family listen to the way you  inhale to be sure your are doing it effectively  Please schedule a follow up visit in 3 months but call sooner if needed with all inhalers in hand     02/03/2015  f/u ov/[name] re: GOLD II/ sarcoid rx =  prednisone 10 / dulera but verypoor hfa - did not bring all inhalers as rec/ pred at 10 mg daily  Chief Complaint  Patient presents with  . Follow-up    Pt states that he has been smoking about once a month and recently did have some SOB afterward that did improve with albuterol HFA use. Pt denies any current cough/wheeze/SOB/CP/tightness. Pt states he does use his inhalers as directed.    very confusing again as to exactly what he takes. I don't believe he's been using Spiriva at all. On the other hand he actually looks pretty good and is really not limited by his breathing at this point. rec Ok to leave off spiriva if you  don't think it's helping  Work on inhaler technique:     05/04/2015  f/u ov/[name] re: GOLD II copd/ sarcoid on prednisone 10 mg daily and dulera 200 bid with hfa close to 0% effective  Chief Complaint  Patient presents with  . Follow-up    pt doing well, does note some L sided sinus congestion with yellow mucus on occasion.    Not limited by breathing from desired activities rec Work on inhaler technique:    I called in a new strength of dulera = 100 Take 2 puffs first thing in am and then another 2 puffs about 12 hours later.  Augmentin 875 mg take one pill twice daily  X 10 days   Nasal saline is excellent to help with nasal congestion      08/10/2015  f/u ov/[name] re: G II COPD/sarcoid/  dulera 100 bid / pred 1 mg daily  Chief Complaint  Patient presents with  . Follow-up    Breathing is doing well. He has occ cough with yellow sputum.   not clear how long he's been on 1 mg pred instead of 10 mg per day but tol well despite very poor hfa tehnique/ sleeping fine as well  rec Please schedule a follow up visit in 6 months but call sooner if needed for breathing issues but bring all inhalers   02/09/2016  f/u ov/[name] re: GII  COPD / burned out sarcoid  Did not bring all inhalers as requested / still on pred 1 mg daily per Dr Manuella Ghazi Chief Complaint  Patient presents with  . Follow-up    Pt states his breathing is doing well and denies any new co's today.    Not limited by breathing from desired activities  And no apparent need for saba on dulera 100 2bid despite poor hfa (see a/p)    No obvious day to day or daytime variability or assoc cough or   cp or chest tightness, subjective wheeze or overt   hb symptoms. No unusual exp hx or h/o childhood pna/ asthma or knowledge of premature birth.  Sleeping ok without nocturnal  or early am exacerbation  of respiratory  c/o's or need for noct saba. Also denies any obvious fluctuation of symptoms with weather or environmental changes or other  aggravating or alleviating factors except as outlined above   Current Medications, Allergies, Complete Past Medical History, Past Surgical History, Family History, and Social History were reviewed in [employer] record.  ROS  The following are not active  complaints unless bolded sore throat, dysphagia, dental problems, itching, sneezing,  nasal congestion or excess/ purulent secretions, ear ache,   fever, chills, sweats, unintended wt loss, classically pleuritic or exertional cp, hemoptysis,  orthopnea pnd or leg swelling, presyncope, palpitations, abdominal pain, anorexia, nausea, vomiting, diarrhea  or change in bowel or bladder habits, change in stools or urine, dysuria,hematuria,  rash, arthralgias, visual complaints, headache, numbness, weakness or ataxia or problems with walking or coordination walks with cane very slowly at home,  change in mood/affect or memory.                  Objective:   Physical Exam  amb bm  Deaf  [place] walks with 4 pronged crutch  08/08/2013  170 > 172 7/16 >  10/17/2013 168 >171 01/13/2014 > 05/13/2014  167>06/13/2014   172 >176 07/01/2014 > 11/04/2014 177 > .02/04/2015 179 > 05/04/2015    167 > 08/10/2015  155 > 02/09/2016   Vital signs reviewed note bp  160/88 not clear if took am meds yet  - Note on arrival 02 sats 99  % on RA    Interpretor in room /Deaf   HEENT: nl dentition, turbinates, and oropharynx. Nl external ear canals without cough reflex   NECK :  without JVD/Nodes/TM/ nl carotid upstrokes bilaterally   LUNGS: no acc muscle use, clear to A and P bilaterally without cough on insp or exp maneuvers   CV:  RRR  no s3 or murmur or increase in P2, no edema   ABD:  soft and nontender with nl excursion in the supine position. No bruits or organomegaly, bowel sounds nl  MS:  warm without deformities, calf tenderness, cyanosis or clubbing  SKIN: warm and dry without lesions      I personally reviewed images and agree with  radiology impression as follows:  CXR:  04/17/15  Evidence of prior granulomatous disease. Areas of scarring bilaterally, stable. No edema or consolidation.          Assessment & Plan:

## 2016-02-26 ENCOUNTER — Ambulatory Visit: Payer: Medicare Other | Admitting: Podiatry

## 2016-03-08 ENCOUNTER — Other Ambulatory Visit: Payer: Self-pay | Admitting: Neurosurgery

## 2016-03-08 ENCOUNTER — Encounter: Payer: Self-pay | Admitting: Neurology

## 2016-03-08 ENCOUNTER — Ambulatory Visit (INDEPENDENT_AMBULATORY_CARE_PROVIDER_SITE_OTHER): Payer: Medicare Other | Admitting: Neurology

## 2016-03-08 VITALS — BP 181/97 | HR 71 | Ht 64.0 in | Wt 157.0 lb

## 2016-03-08 DIAGNOSIS — M4712 Other spondylosis with myelopathy, cervical region: Secondary | ICD-10-CM | POA: Diagnosis not present

## 2016-03-08 DIAGNOSIS — I63311 Cerebral infarction due to thrombosis of right middle cerebral artery: Secondary | ICD-10-CM | POA: Diagnosis not present

## 2016-03-08 DIAGNOSIS — M5441 Lumbago with sciatica, right side: Secondary | ICD-10-CM

## 2016-03-08 DIAGNOSIS — M546 Pain in thoracic spine: Secondary | ICD-10-CM

## 2016-03-08 DIAGNOSIS — G8929 Other chronic pain: Secondary | ICD-10-CM

## 2016-03-08 DIAGNOSIS — M542 Cervicalgia: Secondary | ICD-10-CM

## 2016-03-08 DIAGNOSIS — M5137 Other intervertebral disc degeneration, lumbosacral region: Secondary | ICD-10-CM | POA: Diagnosis not present

## 2016-03-08 DIAGNOSIS — R269 Unspecified abnormalities of gait and mobility: Secondary | ICD-10-CM | POA: Diagnosis not present

## 2016-03-08 MED ORDER — TIZANIDINE HCL 2 MG PO TABS: 2.0000 mg | ORAL_TABLET | Freq: Three times a day (TID) | ORAL | 6 refills | Status: DC

## 2016-03-08 MED ORDER — DULOXETINE HCL 60 MG PO CPEP: 60.0000 mg | ORAL_CAPSULE | Freq: Every day | ORAL | 12 refills | Status: DC

## 2016-03-08 NOTE — Progress Notes (Signed)
Chief Complaint  Patient presents with  . Pain    He is here with his mother Sherrin Daisy), brother [name]), and an interpreter (pt is deaf).  Reporting pain in left leg and right arm.  He also has weakness in his right arm - says it feels like it "freezes up".  . Headache    He is having headaches on most days.  Feels a "shaking" sensation in the back of his head and neck that is not always associated with his headaches.  Charles [name] Hx of CVA      PATIENT: Charles [name] DOB: 1955/05/02  HISTORICAL  KAIMI [name] is a 61 years old right-handed male, accompanied by his mother, and his brother, interpreter, for EMG guided Botox injection for his spastic left hemiparesis,I saw him first in Nov 2015, he was previously patients of Dr. Leonie Man, and Dr. Janann Colonel, who has performed EMG guided Botox injection in January, April, July 2015, for his spastic left hemiparesis, which he responded very well  He was born deaf, He had a history of pulmonary sarcoidosis, was treated with long-term low-dose steroid, also with past medical history of hypertension, diabetes, hyperlipidemia,  He suffered stroke in April 2014, was treated at [place], with residual severe left hemiparesis, per record  MRI of the brain showed remote right hemispheric infarct and small vessel disease type changes  MRA of brain abrupt  cutoff of flow at the right carotid terminus with nonvisualization of right middle cerebral artery branch vessels consistent with the patient's remote right hemispheric infarct. Fetal type origin of the posterior cerebral arteries with posterior cerebral artery supplied predominately from the anterior circulation. 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   2D Echocardiogram 60%, wall motion normal, LA normal size Carotid Doppler Bilateral: 1-39% ICA stenosis. Vertebral artery flow is antegrade  TEE no PFO, Pulm AVM  EEG in August 2014, normal  awake and asleep EEG. No focal or  generalized epileptiform discharges noted   UPDATE May 8th 2017: He is with his mother and brother at today's clinical visit, last visit was November 25th 2015 for EMG guided Botox injection to his spastic left upper and lower extremity, he denies significant improvement with Botox injection.  Today he came in with a new issue, complains a year history of intermittent right elbow discomfort, radiating pain to right arm, right hand, mainly involving right fifths, and fourth fingers, it happened with prolonged sitting, eating, or sleeping, he also noticed mild right hand weakness, difficulty to unscrew the bottle top. 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 He complains progressive worsening gait abnormality, falling few times, neck pain, low back pain, radiating pain to his right leg  UPDATE August 24 2015: He is accompanied by his mother brother and interpreter at today's clinical visit, he continue complains of low back pain radiating pain to right lower extremity, intermittent right arm hands paresthesia, worsening gait difficulty, urinary urgency,  We have personally reviewed MRI of cervical spine in June 2017: There is severe spinal stenosis at C3-C4 and at C4-C5 due to central disc protrusions/herniations, uncovertebral spurring, facet hypertrophy and congenitally short pedicles. There is subtle hyperintense signal within the spinal cord on sagittal STIR images just below  the C3-C4 interspace but this is not confirmed on axial images. This could represent a mild cervical myelopathy. 2. There is moderate spinal stenosis at C5-C6 and C6-C7 mild spinal stenosis at C2-C3 to degenerative changes and congenitally short  pedicles. 3. At C3-C4 there is moderately severe bilateral foraminal narrowing at could lead to impingement either of the C4 nerve roots. There does not appear to be nerve root impingement at the other cervical levels  MRI of lumbar spine in June 2017: At L4-L5, there is broad disc bulging, moderately severe facet hypertrophy, right greater than left, and minimal anterolisthesis. There is no nerve root compression at this level. When compared to the MRI dated 12/24/2012, the synovial cyst that was noted to the left on the prior MRI is no longer present. This relieves the left L5 nerve root impingement that was noted at that time. However, since the prior MRI there has been mild progression of the facet hypertrophy and minimal anterolisthesis . 2. There are milder degenerative changes at L3-L4 and L5-S1 that did not lead to any nerve root impingement, unchanged when compared to the previous MRI.  UPDATE Dec 03 2015: He had cervical decompression laminectomy C3-4-5 6 with foraminotomies of the C4-5 VI nerve roots by Dr. Saintclair Halsted on October 07 2015, patient reported significant improvement in his neck pain, family noticed he has difficulty sleeping, agitated.  I personally reviewed MRI cervical spine in September 2017: Posterior hardware fusion C3 through C7. Posterior decompression C3 through C5. Small posterior fluid collection in the soft tissues may represent postop fluid versus CSF leak. Small area of cord hyperintensity at the C4 level is unchanged compatible with chronic myelomalacia. No cord compression.  He complains of low back pain, left that time pain, had personally reviewed MRI of left femur there was no significant abnormality notice, MRI lumbar in June 2017, multilevel degenerative changes, but there was no significant canal or foraminal stenosis   UPDATE Jan 16th 2018: He complains of neck pain, using heating pad, stiff, limited range of motion, lean back to get relief, difficulty to  get a comfortable position to sleep, he lost drip of his right hand,  Left leg pain when bearing weight, left leg tightness,  We have personally reviewed MRI of left leg showed no significant abnormality, MRI of lumbar in June 2017 showed mild degenerative disc disease but no significant canal or foraminal stenosis CAT scan of the brain in August 2017 showed large right MCA stroke  REVIEW OF SYSTEMS: Full 14 system review of systems performed and notable only for as above ALLERGIES: Allergies  Allergen Reactions  . Shellfish Allergy Hives and Swelling    SWELLING REACTION UNSPECIFIED   . Hydrocodone Other (See Comments)    Makes "looney"  . Contrast Media [Iodinated Diagnostic Agents] Itching  . Oxycodone Nausea And Vomiting    HOME MEDICATIONS: Current Outpatient Prescriptions on File Prior to Visit  Medication Sig Dispense Refill  . albuterol (PROAIR HFA) 108 (90 Base) MCG/ACT inhaler Inhale 2 puffs into the lungs every 6 (six) hours as needed for wheezing or shortness of breath.    Charles [name] aspirin 325 MG EC tablet Take 325 mg by mouth daily.    Charles [name] atorvastatin (LIPITOR) 10 MG tablet Take 10 mg by mouth daily. (cholesterol)    . clopidogrel (PLAVIX) 75 MG tablet Take 75 mg by mouth daily.    Charles [name] esomeprazole (NEXIUM) 40 MG capsule Take 1 capsule (40 mg total) by mouth 2 (two) times daily. 60 capsule  0  . gabapentin (NEURONTIN) 300 MG capsule Take 1 capsule (300 mg total) by mouth 3 (three) times daily. 90 capsule 11  . meloxicam (MOBIC) 7.5 MG tablet   1  . mometasone-formoterol (DULERA) 100-5 MCG/ACT AERO Inhale 2 puffs into the lungs every 12 (twelve) hours. 1 Inhaler 11  . naproxen (NAPROSYN) 500 MG tablet Take 500 mg by mouth 2 (two) times daily with a meal.  0  . olmesartan (BENICAR) 20 MG tablet Take 20 mg by mouth daily.    Charles [name] oxybutynin (DITROPAN XL) 15 MG 24 hr tablet Take 15 mg by mouth at bedtime.    . predniSONE (DELTASONE) 1 MG tablet Take 1 mg by mouth daily with breakfast.      . sitaGLIPtin-metformin (JANUMET) 50-500 MG tablet Take 1 tablet by mouth 2 (two) times daily with a meal.    . traMADol (ULTRAM) 50 MG tablet Take 1 tablet (50 mg total) by mouth every 8 (eight) hours as needed. (Patient taking differently: Take 50 mg by mouth every 8 (eight) hours as needed (pain). ) 60 tablet 3   Current Facility-Administered Medications on File Prior to Visit  Medication Dose Route Frequency Provider Last Rate Last Dose  . botulinum toxin Type A (BOTOX) injection 300 Units  300 Units Intramuscular Once Drema Dallas, DO        PAST MEDICAL HISTORY: Past Medical History:  Diagnosis Date  . Anemia   . Asthma   . Essential hypertension, benign   . GERD (gastroesophageal reflux disease)   . Headache    migraines in the past per pt  . Hearing impaired   . History of stroke    Spastic hemiplegia, right MCA stroke April 2014 in [place]  . Mixed hyperlipidemia   . Other pancytopenia ([place]) 12/23/2015  . PAD (peripheral artery disease) ([place])   . Sarcoidosis ([place])   . Stroke [facility])    2014, left sided weakness  . Type 2 diabetes mellitus ([place])     PAST SURGICAL HISTORY: Past Surgical History:  Procedure Laterality Date  . BRAVO PH STUDY N/A 06/18/2013   pH STUDY SHOWS NEXIUM TWICE DAILY CONTROLS THE ACID IN HIS STOMACH. HE HAD VERY FEWEPISODE OF REGURGITATION RECORDED IN THE 2 DAYS THE STUDY WAS PERFORMED.   Charles [name] COLONOSCOPY  2011   IN [place]  . 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   . ESOPHAGOGASTRODUODENOSCOPY N/A 05/05/2015   Procedure: ESOPHAGOGASTRODUODENOSCOPY (EGD);  Surgeon: Danie Binder, MD;  Location: [facility];  Service: Endoscopy;  Laterality: N/A;  730   . MALONEY DILATION N/A 06/18/2013   Procedure: MALONEY DILATION;  Surgeon: Danie Binder, MD;  Location: [facility];  Service: Endoscopy;  Laterality: N/A;  . OTHER SURGICAL HISTORY     cyst removal head and chest  . POSTERIOR CERVICAL FUSION/FORAMINOTOMY N/A 10/07/2015   Procedure: Cervical Three-Four, Cervical Four-Five, Cervical Five-Six, Cervical Six-Seven Posterior Cervical Laminectomy and Fusion;  Surgeon: Kary Kos, MD;  Location: [facility] NEURO ORS;  Service: Neurosurgery;  Laterality: N/A;  . SAVORY DILATION N/A 06/18/2013   Procedure: SAVORY DILATION;  Surgeon: Danie Binder, MD;  Location: [facility];  Service: Endoscopy;  Laterality: N/A;  . TEE WITHOUT CARDIOVERSION N/A 10/04/2012   Procedure: TRANSESOPHAGEAL  ECHOCARDIOGRAM (TEE);  Surgeon: Thayer Headings, MD;  Location[facility] ENDOSCOPY;  Service: Cardiovascular;  Laterality: N/A;  . [place] accident  Both legs fractured and repaired surgically    FAMILY HISTORY: Family History  Problem Relation Age of Onset  . Diabetes Mother   . Hypertension Mother   . Diabetes Father   . Hypertension Father   . Colon polyps Neg Hx   . Colon cancer Neg Hx     SOCIAL HISTORY:  Social History   Social History  . Marital status: Single    Spouse name: N/A  . Number of children: 0  . Years of education: HS   Occupational History  . UNEMPLOYED    Social History Main Topics  . Smoking status: Former Smoker    Packs/day: 1.00    Years: 29.00    Types: Cigarettes    Quit date: 02/22/1992  . Smokeless tobacco: Never Used  . Alcohol use 0.0 oz/week     Comment: Socially  . Drug use:      Comment: hx of crack cocaine "long time ago" per pt  . Sexual activity: No   Other Topics Concern  . Not on file   Social History Narrative   Patient lives at home with his family. Brother, sister, mother    Patient does not work.    Patient has a high school education.    Patient has one step child       PHYSICAL EXAM   Vitals:   03/08/16 0932  BP: (!) 181/97  Pulse: 71  Weight: 157 lb (71.2 kg)  Height: 5\' 4"  (1.626 m)    Not recorded      Body mass index is 26.95 kg/m.   PHYSICAL EXAMNIATION:  Gen: NAD, conversant, well nourised, obese, well groomed                     Cardiovascular: Regular rate rhythm, no peripheral edema, warm, nontender. Eyes: Conjunctivae clear without exudates or hemorrhage Neck: Supple, no carotid bruise. Pulmonary: Clear to auscultation bilaterally   NEUROLOGICAL EXAM:  MENTAL STATUS: Speech: He is mute depend on interpreter for history,   CRANIAL NERVES: CN II: Visual fields are full to confrontation. Fundoscopic exam is normal with sharp discs and no vascular changes. Pupils are round equal and briskly reactive to light. CN III, IV, VI: extraocular movement are normal. No ptosis. CN V: Facial sensation is intact to pinprick in all 3 divisions bilaterally. Corneal responses are intact.  CN VII: Face is symmetric with normal eye closure and smile. CN VIII: deaf CN IX, X: Palate elevates symmetrically. Phonation is normal. CN XI: Head turning and shoulder shrug are intact CN XII: Tongue is midline with normal movements and no atrophy.  MOTOR: Spastic left hemiparesis, wearing left ankle brace, mild left shoulder antigravity movement left shoulder abduction with passive movement maximum 90, left elbow 170, complains of left elbow pain with passive movement, left thumb in position, left hip flexion 3, left knee extension 4, left knee flexion 4, he also has mild right ankle dorsiflexion weakness.  He has right elbow Tinel signs, mild right finger abduction weakness, mild right hand grip weakness  REFLEXES:  Hyperreflexia of both side, worse on the left side, bilateral Babinski sign.  SENSORY: Intact to light touch, pinprick, positional and vibratory sensation are intact in fingers and toes, with exception of decreased light touch in  the right fifth, and half of  right fourth finger extending to right ulnar palmar, dorsum of right hand  COORDINATION: There is no dysmetria on right finger-to-nose and heel-knee-shin.    GAIT/STANCE: Rely on his 4 foot cane, multiple tends to get up from seated position, left hemi-circumferential, unsteady gait      Lab Results  Component Value Date   WBC 5.8 12/29/2015   HGB 12.3 (L) 12/29/2015   HCT 36.1 (L) 12/29/2015   MCV 87.0 12/29/2015   PLT 125 (L) 12/29/2015      Component Value Date/Time   NA 136 12/29/2015 2039   K 4.1 12/29/2015 2039   CL 103 12/29/2015 2039   CO2 30 12/29/2015 2039   GLUCOSE 127 (H) 12/29/2015 2039   BUN 9 12/29/2015 2039   CREATININE 1.08 12/29/2015 2039   CALCIUM 8.9 12/29/2015 2039   PROT 6.9 12/29/2015 2039   ALBUMIN 3.8 12/29/2015 2039   AST 24 12/29/2015 2039   ALT 23 12/29/2015 2039   ALKPHOS 77 12/29/2015 2039   BILITOT 0.3 12/29/2015 2039   GFRNONAA >60 12/29/2015 2039   GFRAA >60 12/29/2015 2039   Lab Results  Component Value Date   CHOL 164 11/30/2012   HDL 79 11/30/2012   LDLCALC 77 11/30/2012   TRIG 42 11/30/2012   CHOLHDL 2.1 11/30/2012   Lab Results  Component Value Date   HGBA1C 5.1 09/30/2015   Lab Results  Component Value Date   VITAMINB12 399 05/22/2015   Lab Results  Component Value Date   TSH 2.38 11/12/2015      ASSESSMENT AND PLAN  TUF LATONA is a 61 y.o. male  History of stroke, with residual spastic left hemiparesis  He denies significant improvement with EMG guided botulism toxin injection, will put it on hold now  Cervical spondylitic myelopathy  Status post cervical decompression surgery in August 2017, Continued to complains of neck pain  I have encouraged him neck stretching exercise, hot compression  Continue gabapentin 300 mg 3 times a day  Tizanidine 2 mg 3 times a day,  Tramadol, Mobic as needed for pain  Also add on Cymbalta 60 mg daily  Return to clinic in 4 months, other  options are baclofen, Flexeril,   Low back pain, left anterior thigh and hip pain, gait abnormality  Musculoskeletal etiology  Continue hot compression  Mobic 7.5 milligrams twice a day,  Tramadol as needed  Congenital deaf, use sign language, history is through interpreter       Marcial Pacas, M.D. Ph.D.  [facility] [address], [place], [place] [address] [phone]

## 2016-03-15 ENCOUNTER — Ambulatory Visit
Admission: RE | Admit: 2016-03-15 | Discharge: 2016-03-15 | Disposition: A | Discharge status: Home / Self Care | Payer: Medicare Other | Source: Ambulatory Visit | Attending: Neurosurgery | Admitting: Neurosurgery

## 2016-03-15 DIAGNOSIS — M546 Pain in thoracic spine: Secondary | ICD-10-CM

## 2016-03-15 DIAGNOSIS — M50222 Other cervical disc displacement at C5-C6 level: Secondary | ICD-10-CM | POA: Diagnosis not present

## 2016-03-15 DIAGNOSIS — M50221 Other cervical disc displacement at C4-C5 level: Secondary | ICD-10-CM | POA: Diagnosis not present

## 2016-03-16 DIAGNOSIS — Z7902 Long term (current) use of antithrombotics/antiplatelets: Secondary | ICD-10-CM | POA: Diagnosis not present

## 2016-03-16 DIAGNOSIS — Z79899 Other long term (current) drug therapy: Secondary | ICD-10-CM | POA: Diagnosis not present

## 2016-03-16 DIAGNOSIS — Z8673 Personal history of transient ischemic attack (TIA), and cerebral infarction without residual deficits: Secondary | ICD-10-CM | POA: Diagnosis not present

## 2016-03-16 DIAGNOSIS — R45851 Suicidal ideations: Secondary | ICD-10-CM | POA: Diagnosis not present

## 2016-03-16 DIAGNOSIS — Z87891 Personal history of nicotine dependence: Secondary | ICD-10-CM | POA: Diagnosis not present

## 2016-03-16 DIAGNOSIS — I10 Essential (primary) hypertension: Secondary | ICD-10-CM | POA: Diagnosis not present

## 2016-03-16 DIAGNOSIS — H9190 Unspecified hearing loss, unspecified ear: Secondary | ICD-10-CM | POA: Diagnosis not present

## 2016-03-16 DIAGNOSIS — E7800 Pure hypercholesterolemia, unspecified: Secondary | ICD-10-CM | POA: Diagnosis not present

## 2016-03-16 DIAGNOSIS — Z7984 Long term (current) use of oral hypoglycemic drugs: Secondary | ICD-10-CM | POA: Diagnosis not present

## 2016-03-16 DIAGNOSIS — F29 Unspecified psychosis not due to a substance or known physiological condition: Secondary | ICD-10-CM | POA: Diagnosis not present

## 2016-03-16 DIAGNOSIS — E119 Type 2 diabetes mellitus without complications: Secondary | ICD-10-CM | POA: Diagnosis not present

## 2016-03-17 ENCOUNTER — Ambulatory Visit: Payer: Medicare Other | Admitting: Gastroenterology

## 2016-03-17 DIAGNOSIS — F331 Major depressive disorder, recurrent, moderate: Secondary | ICD-10-CM | POA: Diagnosis not present

## 2016-03-29 ENCOUNTER — Ambulatory Visit: Payer: Medicare Other | Admitting: Podiatry

## 2016-03-31 ENCOUNTER — Ambulatory Visit: Payer: Medicare Other | Admitting: Gastroenterology

## 2016-04-01 ENCOUNTER — Ambulatory Visit: Payer: Self-pay | Admitting: Cardiology

## 2016-04-07 ENCOUNTER — Ambulatory Visit: Payer: Medicare Other | Admitting: Gastroenterology

## 2016-04-15 ENCOUNTER — Ambulatory Visit: Payer: Medicare Other | Admitting: Podiatry

## 2016-04-16 DIAGNOSIS — E119 Type 2 diabetes mellitus without complications: Secondary | ICD-10-CM | POA: Diagnosis not present

## 2016-04-16 DIAGNOSIS — R918 Other nonspecific abnormal finding of lung field: Secondary | ICD-10-CM | POA: Diagnosis not present

## 2016-04-16 DIAGNOSIS — R410 Disorientation, unspecified: Secondary | ICD-10-CM | POA: Diagnosis not present

## 2016-04-16 DIAGNOSIS — S01512A Laceration without foreign body of oral cavity, initial encounter: Secondary | ICD-10-CM | POA: Diagnosis not present

## 2016-04-16 DIAGNOSIS — I517 Cardiomegaly: Secondary | ICD-10-CM | POA: Diagnosis not present

## 2016-04-16 DIAGNOSIS — H9190 Unspecified hearing loss, unspecified ear: Secondary | ICD-10-CM | POA: Diagnosis not present

## 2016-04-16 DIAGNOSIS — R569 Unspecified convulsions: Secondary | ICD-10-CM | POA: Diagnosis not present

## 2016-04-16 DIAGNOSIS — Z794 Long term (current) use of insulin: Secondary | ICD-10-CM | POA: Diagnosis not present

## 2016-04-16 DIAGNOSIS — Z79899 Other long term (current) drug therapy: Secondary | ICD-10-CM | POA: Diagnosis not present

## 2016-04-16 DIAGNOSIS — Z87891 Personal history of nicotine dependence: Secondary | ICD-10-CM | POA: Diagnosis not present

## 2016-04-16 DIAGNOSIS — Z8673 Personal history of transient ischemic attack (TIA), and cerebral infarction without residual deficits: Secondary | ICD-10-CM | POA: Diagnosis not present

## 2016-04-16 DIAGNOSIS — J449 Chronic obstructive pulmonary disease, unspecified: Secondary | ICD-10-CM | POA: Diagnosis not present

## 2016-04-16 DIAGNOSIS — F329 Major depressive disorder, single episode, unspecified: Secondary | ICD-10-CM | POA: Diagnosis not present

## 2016-04-21 ENCOUNTER — Other Ambulatory Visit (HOSPITAL_COMMUNITY): Payer: Medicare Other

## 2016-04-21 ENCOUNTER — Ambulatory Visit (HOSPITAL_COMMUNITY): Payer: Medicare Other

## 2016-05-12 DIAGNOSIS — F329 Major depressive disorder, single episode, unspecified: Secondary | ICD-10-CM | POA: Diagnosis not present

## 2016-05-13 DIAGNOSIS — I639 Cerebral infarction, unspecified: Secondary | ICD-10-CM | POA: Diagnosis not present

## 2016-05-13 DIAGNOSIS — R51 Headache: Secondary | ICD-10-CM | POA: Diagnosis not present

## 2016-05-13 DIAGNOSIS — I679 Cerebrovascular disease, unspecified: Secondary | ICD-10-CM | POA: Diagnosis not present

## 2016-05-13 DIAGNOSIS — Z789 Other specified health status: Secondary | ICD-10-CM | POA: Diagnosis not present

## 2016-05-13 DIAGNOSIS — I10 Essential (primary) hypertension: Secondary | ICD-10-CM | POA: Diagnosis not present

## 2016-05-13 DIAGNOSIS — G8110 Spastic hemiplegia affecting unspecified side: Secondary | ICD-10-CM | POA: Diagnosis not present

## 2016-05-13 DIAGNOSIS — I739 Peripheral vascular disease, unspecified: Secondary | ICD-10-CM | POA: Diagnosis not present

## 2016-05-13 DIAGNOSIS — E1165 Type 2 diabetes mellitus with hyperglycemia: Secondary | ICD-10-CM | POA: Diagnosis not present

## 2016-05-13 DIAGNOSIS — F419 Anxiety disorder, unspecified: Secondary | ICD-10-CM | POA: Diagnosis not present

## 2016-05-13 DIAGNOSIS — E7800 Pure hypercholesterolemia, unspecified: Secondary | ICD-10-CM | POA: Diagnosis not present

## 2016-05-13 DIAGNOSIS — Z6824 Body mass index (BMI) 24.0-24.9, adult: Secondary | ICD-10-CM | POA: Diagnosis not present

## 2016-05-13 DIAGNOSIS — Z299 Encounter for prophylactic measures, unspecified: Secondary | ICD-10-CM | POA: Diagnosis not present

## 2016-05-18 DIAGNOSIS — F329 Major depressive disorder, single episode, unspecified: Secondary | ICD-10-CM | POA: Diagnosis not present

## 2016-05-22 ENCOUNTER — Emergency Department (HOSPITAL_COMMUNITY)
Admission: EM | Admit: 2016-05-22 | Discharge: 2016-05-23 | Disposition: A | Discharge status: Home / Self Care | Payer: Medicare Other | Attending: Emergency Medicine | Admitting: Emergency Medicine

## 2016-05-22 ENCOUNTER — Encounter (HOSPITAL_COMMUNITY): Payer: Self-pay | Admitting: *Deleted

## 2016-05-22 DIAGNOSIS — R51 Headache: Secondary | ICD-10-CM | POA: Diagnosis present

## 2016-05-22 DIAGNOSIS — G43009 Migraine without aura, not intractable, without status migrainosus: Secondary | ICD-10-CM | POA: Diagnosis not present

## 2016-05-22 DIAGNOSIS — I10 Essential (primary) hypertension: Secondary | ICD-10-CM | POA: Diagnosis not present

## 2016-05-22 DIAGNOSIS — J45909 Unspecified asthma, uncomplicated: Secondary | ICD-10-CM | POA: Diagnosis not present

## 2016-05-22 DIAGNOSIS — E119 Type 2 diabetes mellitus without complications: Secondary | ICD-10-CM | POA: Insufficient documentation

## 2016-05-22 DIAGNOSIS — Z87891 Personal history of nicotine dependence: Secondary | ICD-10-CM | POA: Diagnosis not present

## 2016-05-22 DIAGNOSIS — Z7984 Long term (current) use of oral hypoglycemic drugs: Secondary | ICD-10-CM | POA: Diagnosis not present

## 2016-05-22 DIAGNOSIS — Z7982 Long term (current) use of aspirin: Secondary | ICD-10-CM | POA: Diagnosis not present

## 2016-05-22 DIAGNOSIS — G4489 Other headache syndrome: Secondary | ICD-10-CM | POA: Diagnosis not present

## 2016-05-22 DIAGNOSIS — R4781 Slurred speech: Secondary | ICD-10-CM | POA: Diagnosis not present

## 2016-05-22 DIAGNOSIS — I6789 Other cerebrovascular disease: Secondary | ICD-10-CM | POA: Diagnosis not present

## 2016-05-22 DIAGNOSIS — R531 Weakness: Secondary | ICD-10-CM | POA: Diagnosis not present

## 2016-05-22 DIAGNOSIS — R061 Stridor: Secondary | ICD-10-CM | POA: Diagnosis not present

## 2016-05-22 HISTORY — DX: Unspecified convulsions: R56.9

## 2016-05-22 NOTE — ED Triage Notes (Addendum)
Family states pt started having a left sided headache with photo sensitivity since around 10:30 pm. Family states that the pt has been holding his head saying that his head is throbbing. Pt also has been shaky. 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  Family reporting that the pt may have had a seizure. Pt was just released from a hospital in [place] ([place]).

## 2016-05-23 DIAGNOSIS — G43009 Migraine without aura, not intractable, without status migrainosus: Secondary | ICD-10-CM | POA: Diagnosis not present

## 2016-05-23 LAB — CBG MONITORING, ED: GLUCOSE-CAPILLARY: 117 mg/dL — AB (ref 65–99)

## 2016-05-23 MED ORDER — SODIUM CHLORIDE 0.9 % IV BOLUS (SEPSIS)
1000.0000 mL | Freq: Once | INTRAVENOUS | Status: AC
  Administered 2016-05-23: 1000 mL via INTRAVENOUS

## 2016-05-23 MED ORDER — METOCLOPRAMIDE HCL 5 MG/ML IJ SOLN
10.0000 mg | Freq: Once | INTRAMUSCULAR | Status: AC
  Administered 2016-05-23: 10 mg via INTRAVENOUS
  Filled 2016-05-23: qty 2

## 2016-05-23 MED ORDER — LEVETIRACETAM 500 MG PO TABS: 500.0000 mg | ORAL_TABLET | Freq: Two times a day (BID) | ORAL | 0 refills | Status: DC

## 2016-05-23 MED ORDER — DIPHENHYDRAMINE HCL 50 MG/ML IJ SOLN
25.0000 mg | Freq: Once | INTRAMUSCULAR | Status: AC
  Administered 2016-05-23: 25 mg via INTRAVENOUS
  Filled 2016-05-23: qty 1

## 2016-05-23 MED ORDER — SODIUM CHLORIDE 0.9 % IV BOLUS (SEPSIS)
500.0000 mL | Freq: Once | INTRAVENOUS | Status: AC
  Administered 2016-05-23: 500 mL via INTRAVENOUS

## 2016-05-23 MED ORDER — LEVETIRACETAM IN NACL 1000 MG/100ML IV SOLN
1000.0000 mg | Freq: Once | INTRAVENOUS | Status: AC
  Administered 2016-05-23: 1000 mg via INTRAVENOUS
  Filled 2016-05-23: qty 100

## 2016-05-23 MED ORDER — DEXAMETHASONE SODIUM PHOSPHATE 10 MG/ML IJ SOLN
10.0000 mg | Freq: Once | INTRAMUSCULAR | Status: AC
  Administered 2016-05-23: 10 mg via INTRAVENOUS
  Filled 2016-05-23: qty 1

## 2016-05-23 NOTE — ED Notes (Signed)
Pt sleeping. 

## 2016-05-23 NOTE — Discharge Instructions (Signed)
Look at the migraine headache information. He needs to get the keppra (seizure medication) filled later today and take it twice a day. Follow up with Dr Manuella Ghazi.

## 2016-05-23 NOTE — ED Provider Notes (Signed)
[facility] DEPT Provider Note   CSN: [id number] Arrival date & time: 05/22/16  2332   By signing my name below, I, Charles [name], attest that this documentation has been prepared under the direction and in the presence of Charles Porter, MD. Electronically Signed: Hilbert [name], Scribe. 05/23/16. 1:16 AM.  Time seen 12:08 AM   History   Chief Complaint Chief Complaint  Patient presents with  . Headache    The history is provided by the patient. A language interpreter was used.  HPI Comments: Charles [name] is a 61 y.o. male brought in by ambulance, who presents to the Emergency Department complaining of a left-sided headache for the past couple of days. Around 10:30 pm on 05/23/2016 the headache began to worsen with photophobia. The patient describes his headache as a "throbbing" pain. The patient also reports some blurriness in his left eye. The patient states that he was trying to sleep when the pain began. He states the headache made him feel anxiety and nervous. He states that he then got out of bed and began to have left-sided shakiness. he states that he has had headaches in the past. He states that his last headache similar to this one occurred after he had a stroke in 2014 and actually he has been having these headaches every month since his stroke.  The patient believes that his headaches could be due to something he ate or drank. The patient was recently admitted to [facility] for suicidal ideation and was diagnosed with seizures during that hospitalization. discharged from the hospital for seizures on 05/10/2016. He was started on kepra 500 mg BID. He states that he has been out of his seizure medication for the past 2 to 3 weeks. He was only dispensed 14 pills so he has probably only been out of the medications 1 week. They state they are waiting for the pharmacy to call that his medications have been filled.  His family has never seen him have a seizure. The  patient ambulates with a cane after his stroke. Charles [name] He denies nausea, fever, vomiting, and any urinary symptoms. He denies new weakness. He also denies SI/HI.  PCP [name], MD   Past Medical History:  Diagnosis Date  . Anemia   . Asthma   . Deaf   . Essential hypertension, benign   . GERD (gastroesophageal reflux disease)   . Headache    migraines in the past per pt  . Hearing impaired   . History of stroke    Spastic hemiplegia, right MCA stroke April 2014 in [place]  . Mixed hyperlipidemia   . Other pancytopenia ([place]) 12/23/2015  . PAD (peripheral artery disease) ([place])   . Sarcoidosis ([place])   . Seizures ([place])   . Stroke [facility])    2014, left sided weakness  . Type 2 diabetes mellitus [facility])     Patient Active Problem List   Diagnosis Date Noted  . MDD (major depressive disorder), recurrent episode, severe ([place]) 12/30/2015  . Suicidal ideation   . Other pancytopenia ([place]) 12/23/2015  . Loss of weight 11/12/2015  . 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 Iron deficiency anemia 09/22/2015  . Spinal stenosis in cervical region 08/21/2015  . Neck pain 08/12/2015  . Low back pain 08/12/2015  . Abnormality of gait 08/12/2015  . Neuropathy of right upper extremity 06/29/2015  . Acute rhinitis 05/05/2015  . Abdominal pain, epigastric 04/23/2015  . Arm pain, right 04/23/2015  . Atypical chest pain 07/23/2013  .  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 Dyspepsia 06/13/2013  . Dysphagia 06/13/2013  . Headache 11/30/2012  . Solitary pulmonary nodule 11/20/2012  . Spastic hemiplegia affecting left nondominant side ([place]) 11/08/2012  . Essential hypertension, benign 10/12/2012  . COPD GOLD II with reversibility  10/12/2012  . 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 CVA (cerebral vascular accident) ([facility]) 10/03/2012  . Type II or unspecified type diabetes mellitus without mention of complication, uncontrolled 10/03/2012  . Sarcoidosis ([place]) 10/03/2012    Past  Surgical History:  Procedure Laterality Date  . BRAVO PH STUDY N/A 06/18/2013   pH STUDY SHOWS NEXIUM TWICE DAILY CONTROLS THE ACID IN HIS STOMACH. HE HAD VERY FEWEPISODE OF REGURGITATION RECORDED IN THE 2 DAYS THE STUDY WAS PERFORMED.   Charles [name] COLONOSCOPY  2011   IN [place]  . 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   . ESOPHAGOGASTRODUODENOSCOPY N/A 05/05/2015   Procedure: ESOPHAGOGASTRODUODENOSCOPY (EGD);  Surgeon: Danie Binder, MD;  Location[facility] END[facility];  Service: Endoscopy;  Laterality: N/A;  730  . MALONEY DILATION N/A 06/18/2013   Procedure: MALONEY DILATION;  Surgeon: Danie Binder, MD;  Location: [facility];  Service: Endoscopy;  Laterality: N/A;  . OTHER SURGICAL HISTORY     cyst removal head and chest  . POSTERIOR CERVICAL FUSION/FORAMINOTOMY N/A 10/07/2015   Procedure: Cervical Three-Four, Cervical Four-Five, Cervical Five-Six, Cervical Six-Seven Posterior Cervical Laminectomy and Fusion;  Surgeon: Kary Kos, MD;  Location: [facility] NEURO ORS;  Service: Neurosurgery;  Laterality: N/A;  . SAVORY DILATION N/A 06/18/2013   Procedure: SAVORY DILATION;  Surgeon: Danie Binder, MD;  Location: [facility];  Service: Endoscopy;  Laterality: N/A;  . TEE WITHOUT CARDIOVERSION N/A 10/04/2012   Procedure: TRANSESOPHAGEAL ECHOCARDIOGRAM (TEE);  Surgeon: Thayer Headings, MD;  Location: [place] [facility];  Service: Cardiovascular;  Laterality: N/A;  . [place] accident  Both legs fractured and repaired surgically       Home Medications    Prior to Admission medications   Medication Sig Start Date End Date Taking? Authorizing Provider  albuterol (PROAIR HFA) 108 (90 Base)  MCG/ACT inhaler Inhale 2 puffs into the lungs every 6 (six) hours as needed for wheezing or shortness of breath.    Historical Provider, MD  aspirin 325 MG EC tablet Take 325 mg by mouth daily.    Historical Provider, MD  atorvastatin (LIPITOR) 10 MG tablet Take 10 mg by mouth daily. (cholesterol)    Historical Provider, MD  clopidogrel (PLAVIX) 75 MG tablet Take 75 mg by mouth daily.    Historical Provider, MD  DULoxetine (CYMBALTA) 60 MG capsule Take 1 capsule (60 mg total) by mouth daily. 03/08/16   Marcial Pacas, MD  esomeprazole (NEXIUM) 40 MG capsule Take 1 capsule (40 mg total) by mouth 2 (two) times daily. 04/17/15   Carmin Muskrat, MD  gabapentin (NEURONTIN) 300 MG capsule Take 1 capsule (300 mg total) by mouth 3 (three) times daily. 12/07/15   Marcial Pacas, MD  levETIRAcetam (KEPPRA) 500 MG tablet Take 1 tablet (500 mg total) by mouth 2 (two) times daily. 05/23/16   Charles Porter, MD  meloxicam (MOBIC) 7.5 MG tablet  10/01/15   Historical Provider, MD  mometasone-formoterol (DULERA) 100-5 MCG/ACT AERO Inhale  2 puffs into the lungs every 12 (twelve) hours. 02/09/16   Tanda Rockers, MD  naproxen (NAPROSYN) 500 MG tablet Take 500 mg by mouth 2 (two) times daily with a meal. 09/07/15   Historical Provider, MD  olmesartan (BENICAR) 20 MG tablet Take 20 mg by mouth daily.    Historical Provider, MD  oxybutynin (DITROPAN XL) 15 MG 24 hr tablet Take 15 mg by mouth at bedtime.    Historical Provider, MD  predniSONE (DELTASONE) 1 MG tablet Take 1 mg by mouth daily with breakfast.    Historical Provider, MD  sitaGLIPtin-metformin (JANUMET) 50-500 MG tablet Take 1 tablet by mouth 2 (two) times daily with a meal.    Historical Provider, MD  tiZANidine (ZANAFLEX) 2 MG tablet Take 1 tablet (2 mg total) by mouth 3 (three) times daily. 03/08/16   Marcial Pacas, MD  traMADol (ULTRAM) 50 MG tablet Take 1 tablet (50 mg total) by mouth every 8 (eight) hours as needed. Patient taking differently: Take 50 mg by mouth every 8  (eight) hours as needed (pain).  08/12/15   Marcial Pacas, MD    Family History Family History  Problem Relation Age of Onset  . Diabetes Mother   . Hypertension Mother   . Diabetes Father   . Hypertension Father   . Colon polyps Neg Hx   . Colon cancer Neg Hx     Social History Social History  Substance Use Topics  . Smoking status: Former Smoker    Packs/day: 1.00    Years: 29.00    Types: Cigarettes    Quit date: 02/22/1992  . Smokeless tobacco: Never Used  . Alcohol use No  lives with his Mother   Allergies   Shellfish allergy; Hydrocodone; Contrast media [iodinated diagnostic agents]; and Oxycodone   Review of Systems Review of Systems  Constitutional: Negative for fever.  Eyes: Positive for photophobia.  Respiratory: Negative for cough and shortness of breath.   Cardiovascular: Negative for chest pain.  Gastrointestinal: Negative for abdominal pain.  Neurological: Positive for headaches.  Psychiatric/Behavioral: The patient is nervous/anxious.   All other systems reviewed and are negative.    Physical Exam Updated Vital Signs BP (!) 164/95   Pulse 66   Temp 98.4 F (36.9 C) (Oral)   Resp 11   Ht 5\' 4"  (1.626 m)   Wt 164 lb (74.4 kg)   SpO2 94%   BMI 28.15 kg/m   Vital signs normal except for hypertension   Physical Exam  Constitutional: He is oriented to person, place, and time. He appears well-developed and well-nourished.  Non-toxic appearance. He does not appear ill. No distress.  Sign language interpreter was used.  HENT:  Head: Normocephalic and atraumatic.  Right Ear: External ear normal.  Left Ear: External ear normal.  Nose: Nose normal. No mucosal edema or rhinorrhea.  Mouth/Throat: Oropharynx is clear and moist and mucous membranes are normal. No dental abscesses or uvula swelling.  Eyes: Conjunctivae and EOM are normal. Pupils are equal, round, and reactive to light.  Neck: Normal range of motion and full passive range of motion without  pain. Neck supple.  Cardiovascular: Normal rate, regular rhythm and normal heart sounds.  Exam reveals no gallop and no friction rub.   No murmur heard. Pulmonary/Chest: Effort normal and breath sounds normal. No respiratory distress. He has no wheezes. He has no rhonchi. He has no rales. He exhibits no tenderness and no crepitus.  Abdominal: Soft. Normal appearance and bowel sounds are  normal. He exhibits no distension. There is no tenderness. There is no rebound and no guarding.  Musculoskeletal: He exhibits deformity. He exhibits no edema or tenderness.  Neurological: He is alert and oriented to person, place, and time. He has normal strength. No cranial nerve deficit.  He has flexion contractures of left upper arm with no mobility. He is able to lift his left leg against gravity and have flexion. He has normal grip strength on right. No pronator drift on right. He has normal strength in RLE.  Skin: Skin is warm, dry and intact. No rash noted. No erythema. No pallor.  Psychiatric: He has a normal mood and affect. His speech is normal and behavior is normal. His mood appears not anxious.  Nursing note and vitals reviewed.   ED Treatments / Results   DIAGNOSTIC STUDIES: Oxygen Saturation is 97% on RA, normal by my interpretation.    Labs (all labs ordered are listed, but only abnormal results are displayed) Results for orders placed or performed during the hospital encounter of 05/22/16  CBG monitoring, ED  Result Value Ref Range   Glucose-Capillary 117 (H) 65 - 99 mg/dL   Laboratory interpretation all normal   EKG  EKG Interpretation  Date/Time:  Sunday May 22 2016 23:44:27 EDT Ventricular Rate:  66 PR Interval:    QRS Duration: 99 QT Interval:  422 QTC Calculation: 443 R Axis:   76 Text Interpretation:  Sinus rhythm Probable left ventricular hypertrophy Early repolarization pattern No significant change since last tracing  17 Apr 2015 Confirmed by Breiana Stratmann  MD-I, Geriann Lafont ([id number])  on 05/22/2016 11:57:46 PM       Radiology No results found.  Procedures Procedures (including critical care time)  Medications Ordered in ED Medications  metoCLOPramide (REGLAN) injection 10 mg (10 mg Intravenous Given 05/23/16 0101)  diphenhydrAMINE (BENADRYL) injection 25 mg (25 mg Intravenous Given 05/23/16 0102)  dexamethasone (DECADRON) injection 10 mg (10 mg Intravenous Given 05/23/16 0102)  sodium chloride 0.9 % bolus 1,000 mL (0 mLs Intravenous Stopped 05/23/16 0138)  sodium chloride 0.9 % bolus 500 mL (0 mLs Intravenous Stopped 05/23/16 0223)  levETIRAcetam (KEPPRA) IVPB 1000 mg/100 mL premix (1,000 mg Intravenous Given 05/23/16 0138)     Initial Impression / Assessment and Plan / ED Course  I have reviewed the triage vital signs and the nursing notes.  Pertinent labs & imaging results that were available during my care of the patient were reviewed by me and considered in my medical decision making (see chart for details).   COORDINATION OF CARE: 12:19 AM Discussed treatment plan with pt at bedside and pt agreed to plan. I will check the patient's CT scan.  Patient describes his headache like a migraine type headache. He has been getting them monthly. His family was advised he might get headaches after his stroke. He was given migraine cocktail and his headache resolved. He was given a loading dose of his Keppra IV. At time of discharge he felt much improved. CT scan was not done because patient has a history of these headaches in his neurological symptoms are not worse from his baseline.  Final Clinical Impressions(s) / ED Diagnoses   Final diagnoses:  Migraine without aura and without status migrainosus, not intractable    New Prescriptions New Prescriptions   LEVETIRACETAM (KEPPRA) 500 MG TABLET    Take 1 tablet (500 mg total) by mouth 2 (two) times daily.   Plan discharge  Charles Porter, MD, FACEP  I personally performed the  services described in this documentation, which  was scribed in my presence. The recorded information has been reviewed and considered.  Charles Porter, MD, Barbette Or, MD 05/23/16 [phone]

## 2016-05-25 DIAGNOSIS — I10 Essential (primary) hypertension: Secondary | ICD-10-CM | POA: Diagnosis not present

## 2016-05-25 DIAGNOSIS — Z299 Encounter for prophylactic measures, unspecified: Secondary | ICD-10-CM | POA: Diagnosis not present

## 2016-05-25 DIAGNOSIS — Z789 Other specified health status: Secondary | ICD-10-CM | POA: Diagnosis not present

## 2016-05-25 DIAGNOSIS — Z6824 Body mass index (BMI) 24.0-24.9, adult: Secondary | ICD-10-CM | POA: Diagnosis not present

## 2016-05-25 DIAGNOSIS — Z8659 Personal history of other mental and behavioral disorders: Secondary | ICD-10-CM | POA: Diagnosis not present

## 2016-05-25 DIAGNOSIS — R51 Headache: Secondary | ICD-10-CM | POA: Diagnosis not present

## 2016-05-25 DIAGNOSIS — E7800 Pure hypercholesterolemia, unspecified: Secondary | ICD-10-CM | POA: Diagnosis not present

## 2016-05-25 DIAGNOSIS — E1165 Type 2 diabetes mellitus with hyperglycemia: Secondary | ICD-10-CM | POA: Diagnosis not present

## 2016-05-25 DIAGNOSIS — I739 Peripheral vascular disease, unspecified: Secondary | ICD-10-CM | POA: Diagnosis not present

## 2016-05-25 DIAGNOSIS — K219 Gastro-esophageal reflux disease without esophagitis: Secondary | ICD-10-CM | POA: Diagnosis not present

## 2016-06-03 ENCOUNTER — Encounter: Payer: Self-pay | Admitting: Endocrinology

## 2016-06-03 ENCOUNTER — Ambulatory Visit (INDEPENDENT_AMBULATORY_CARE_PROVIDER_SITE_OTHER): Payer: Medicare Other | Admitting: Endocrinology

## 2016-06-03 VITALS — BP 122/84 | HR 74 | Ht 64.0 in | Wt 162.0 lb

## 2016-06-03 DIAGNOSIS — I63311 Cerebral infarction due to thrombosis of right middle cerebral artery: Secondary | ICD-10-CM

## 2016-06-03 DIAGNOSIS — E1151 Type 2 diabetes mellitus with diabetic peripheral angiopathy without gangrene: Secondary | ICD-10-CM | POA: Diagnosis not present

## 2016-06-03 MED ORDER — GLUCOSE BLOOD VI STRP: 1.0000 | ORAL_STRIP | Freq: Every day | 3 refills | Status: DC

## 2016-06-03 NOTE — Patient Instructions (Addendum)
good diet and exercise significantly improve the control of your diabetes.  please let me know if you wish to be referred to a dietician.  high blood sugar is very risky to your health.  you should see an eye doctor and dentist every year.  It is very important to get all recommended vaccinations.  Controlling your blood pressure and cholesterol drastically reduces the damage diabetes does to your body.  Those who smoke should quit.  Please discuss these with your doctor.  check your blood sugar once a day.  vary the time of day when you check, between before the 3 meals, and at bedtime.  also check if you have symptoms of your blood sugar being too high or too low.  please keep a record of the readings and bring it to your next appointment here (or you can bring the meter itself).  You can write it on any piece of paper.  please call us sooner if your blood sugar goes below 70, or if you have a lot of readings over 200.   Please continue the same [place].   I would be happy to see you back here as needed.

## 2016-06-03 NOTE — Progress Notes (Signed)
Subjective:    Patient ID: Charles [name], male    DOB: Apr 26, 1955, 61 y.o.   MRN: [id number]  HPI pt is referred by Dr Manuella Ghazi, for diabetes.  Pt states DM was dx'ed in 2010; he has mild if any neuropathy of the lower extremities, but he has assoc CVA; he has been on insulin since; pt says his diet is good, but exercise is limited by CVA; he has never had pancreatitis, pancreatic surgery, severe hypoglycemia or DKA.  He takes [place] only.  He takes low-dose prednisone for sarcoidosis.  He does not check cbg's.   Past Medical History:  Diagnosis Date  . Anemia   . Asthma   . Deaf   . Essential hypertension, benign   . GERD (gastroesophageal reflux disease)   . Headache    migraines in the past per pt  . Hearing impaired   . History of stroke    Spastic hemiplegia, right MCA stroke April 2014 in [place]  . Mixed hyperlipidemia   . Other pancytopenia ([place]) 12/23/2015  . PAD (peripheral artery disease) ([place])   . Sarcoidosis   . Seizures ([place])   . Stroke [facility])    2014, left sided weakness  . Type 2 diabetes mellitus ([place])     Past Surgical History:  Procedure Laterality Date  . BRAVO PH STUDY N/A 06/18/2013   pH STUDY SHOWS NEXIUM TWICE DAILY CONTROLS THE ACID IN HIS STOMACH. HE HAD VERY FEWEPISODE OF REGURGITATION RECORDED IN THE 2 DAYS THE STUDY WAS PERFORMED.   [name] COLONOSCOPY  2011   [place]  . 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   . ESOPHAGOGASTRODUODENOSCOPY N/A 05/05/2015   Procedure: ESOPHAGOGASTRODUODENOSCOPY (EGD);  Surgeon: Danie Binder, MD;  [facility];  Service: Endoscopy;  Laterality: N/A;  730  . MALONEY DILATION N/A 06/18/2013   Procedure: MALONEY  DILATION;  Surgeon: Danie Binder, MD;  Location[facility];  Service: Endoscopy;  Laterality: N/A;  . OTHER SURGICAL HISTORY     cyst removal head and chest  . POSTERIOR CERVICAL FUSION/FORAMINOTOMY N/A 10/07/2015   Procedure: Cervical Three-Four, Cervical Four-Five, Cervical Five-Six, Cervical Six-Seven Posterior Cervical Laminectomy and Fusion;  Surgeon: Kary Kos, MD;  Location: [facility];  Service: Neurosurgery;  Laterality: N/A;  . SAVORY DILATION N/A 06/18/2013   Procedure: SAVORY DILATION;  Surgeon: Danie Binder, MD;  [facility];  Service: Endoscopy;  Laterality: N/A;  . TEE WITHOUT CARDIOVERSION N/A 10/04/2012   Procedure: TRANSESOPHAGEAL ECHOCARDIOGRAM (TEE);  Surgeon: Thayer Headings, MD;  Location: [facility];  Service: Cardiovascular;  Laterality: N/A;  . [place] accident  Both legs fractured and repaired surgically    Social History   Social History  . Marital status: Single    Spouse name: N/A  . Number of children: 0  . Years of education: HS   Occupational History  . UNEMPLOYED    Social History Main Topics  . Smoking status: Former Smoker    Packs/day: 1.00    Years: 29.00    Types: Cigarettes    Quit date: 02/22/1992  . Smokeless tobacco: Never Used  . Alcohol use No  . Drug use: Yes  Comment: hx of crack cocaine "long time ago" per pt  . Sexual activity: No   Other Topics Concern  . Not on file   Social History Narrative   Patient lives at home with his family. Brother, sister, mother    Patient does not work.    Patient has a high school education.    Patient has one step child     Current Outpatient Prescriptions on File Prior to Visit  Medication Sig Dispense Refill  . albuterol (PROAIR HFA) 108 (90 Base) MCG/ACT inhaler Inhale 2 puffs into the lungs every 6 (six) hours as needed for wheezing or shortness of breath.    [name] aspirin 325 MG EC tablet Take 325 mg by mouth daily.    [name] atorvastatin  (LIPITOR) 10 MG tablet Take 10 mg by mouth daily. (cholesterol)    . esomeprazole (NEXIUM) 40 MG capsule Take 1 capsule (40 mg total) by mouth 2 (two) times daily. 60 capsule 0  . gabapentin (NEURONTIN) 300 MG capsule Take 1 capsule (300 mg total) by mouth 3 (three) times daily. 90 capsule 11  . levETIRAcetam (KEPPRA) 500 MG tablet Take 1 tablet (500 mg total) by mouth 2 (two) times daily. 60 tablet 0  . mometasone-formoterol (DULERA) 100-5 MCG/ACT AERO Inhale 2 puffs into the lungs every 12 (twelve) hours. 1 Inhaler 11  . predniSONE (DELTASONE) 1 MG tablet Take 1 mg by mouth daily with breakfast.    . clopidogrel (PLAVIX) 75 MG tablet Take 75 mg by mouth daily.    . DULoxetine (CYMBALTA) 60 MG capsule Take 1 capsule (60 mg total) by mouth daily. 30 capsule 12  . meloxicam (MOBIC) 7.5 MG tablet   1  . naproxen (NAPROSYN) 500 MG tablet Take 500 mg by mouth 2 (two) times daily with a meal.  0  . olmesartan (BENICAR) 20 MG tablet Take 20 mg by mouth daily.    [name] oxybutynin (DITROPAN XL) 15 MG 24 hr tablet Take 15 mg by mouth at bedtime.    . sitaGLIPtin-metformin (JANUMET) 50-500 MG tablet Take 1 tablet by mouth 2 (two) times daily with a meal.    . tiZANidine (ZANAFLEX) 2 MG tablet Take 1 tablet (2 mg total) by mouth 3 (three) times daily. 60 tablet 6  . traMADol (ULTRAM) 50 MG tablet Take 1 tablet (50 mg total) by mouth every 8 (eight) hours as needed. (Patient taking differently: Take 50 mg by mouth every 8 (eight) hours as needed (pain). ) 60 tablet 3   Current Facility-Administered Medications on File Prior to Visit  Medication Dose Route Frequency Provider Last Rate Last Dose  . botulinum toxin Type A (BOTOX) injection 300 Units  300 Units Intramuscular Once Drema Dallas, DO        Allergies  Allergen Reactions  . Shellfish Allergy Hives and Swelling    SWELLING REACTION UNSPECIFIED   . Hydrocodone Other (See Comments)    Makes "looney"  . Contrast Media [Iodinated Diagnostic Agents]  Itching  . Oxycodone Nausea And Vomiting    Family History  Problem Relation Age of Onset  . Diabetes Mother   . Hypertension Mother   . Diabetes Father   . Hypertension Father   . Colon polyps Neg Hx   . Colon cancer Neg Hx     BP 122/84   Pulse 74   Ht 5\' 4"  (1.626 m)   Wt 162 lb (73.5 kg)   SpO2 97%   BMI 27.81 kg/m  Review of Systems denies weight loss, blurry vision, chest pain, sob, n/v, muscle cramps, excessive diaphoresis, cold intolerance, rhinorrhea, and easy bruising.  He has chronic hearing loss, intermittent headache, low-back pain, depression, frequent urination, and weakness of the LUE and LLE.      Objective:   Physical Exam VS: see vs page GEN: no distress.  In wheelchair HEAD: head: no deformity eyes: no periorbital swelling, no proptosis external nose and ears are normal mouth: no lesion seen NECK: supple, thyroid is not enlarged CHEST WALL: no deformity LUNGS: clear to auscultation CV: reg rate and rhythm, no murmur ABD: abdomen is soft, nontender.  no hepatosplenomegaly.  not distended.  no hernia MUSCULOSKELETAL: muscle bulk and strength are grossly normal on the right, but severely decreased on the LLE and LUE.  no obvious joint swelling.  gait is normal and steady EXTEMITIES: no deformity.  no ulcer on the feet.  feet are of normal color and temp.  no edema PULSES: dorsalis pedis intact bilat.  no carotid bruit NEURO:  cn 2-12 grossly intact.   readily moves all 4's.  sensation is intact to touch on the feet SKIN:  Normal texture and temperature.  No rash or suspicious lesion is visible.   NODES:  None palpable at the neck PSYCH: alert, well-oriented.  Does not appear anxious nor depressed.    outside test results are reviewed: a1c=5.8%  I have reviewed outside records, and summarized: Pt was noted to have DM, and ref here.  he was seen in f/u of hospitalization, and was feeling much better    Assessment & Plan:  Type 2 DM:  well-controlled.  Please continue the same medication. Not absolute indication to monitor cbg's, but it is good to check anyway. h/o CVA: he should avoid hypoglycemia.    Patient Instructions  good diet and exercise significantly improve the control of your diabetes.  please let me know if you wish to be referred to a dietician.  high blood sugar is very risky to your health.  you should see an eye doctor and dentist every year.  It is very important to get all recommended vaccinations.  Controlling your blood pressure and cholesterol drastically reduces the damage diabetes does to your body.  Those who smoke should quit.  Please discuss these with your doctor.  check your blood sugar once a day.  vary the time of day when you check, between before the 3 meals, and at bedtime.  also check if you have symptoms of your blood sugar being too high or too low.  please keep a record of the readings and bring it to your next appointment here (or you can bring the meter itself).  You can write it on any piece of paper.  please call us sooner if your blood sugar goes below 70, or if you have a lot of readings over 200.   Please continue the same [place].   I would be happy to see you back here as needed.

## 2016-06-13 ENCOUNTER — Telehealth: Payer: Self-pay | Admitting: Neurology

## 2016-06-13 ENCOUNTER — Other Ambulatory Visit: Payer: Self-pay | Admitting: Neurosurgery

## 2016-06-13 DIAGNOSIS — M542 Cervicalgia: Secondary | ICD-10-CM

## 2016-06-13 NOTE — Telephone Encounter (Signed)
Spoke to patient's mother on HIPAA - reports patient is having the same pain but it has worsened to the point of causing him gait difficulty.  She would like him to be seen this week.  He has been worked into Dr. Rhea Belton schedule on 06/15/16.

## 2016-06-13 NOTE — Telephone Encounter (Signed)
Patients mother called office in reference to patient having pain in his head down his neck to his back all the way down to his buttocks.  Patient states he can barely walk at time.  Patient in ED in April.  Requesting patient be seen sooner than July.  Please call

## 2016-06-15 ENCOUNTER — Ambulatory Visit: Payer: Self-pay | Admitting: Neurology

## 2016-06-15 ENCOUNTER — Telehealth: Payer: Self-pay | Admitting: *Deleted

## 2016-06-15 NOTE — Telephone Encounter (Signed)
No showed follow up appointment. 

## 2016-06-16 ENCOUNTER — Encounter: Payer: Self-pay | Admitting: Neurology

## 2016-07-04 ENCOUNTER — Ambulatory Visit
Admission: RE | Admit: 2016-07-04 | Discharge: 2016-07-04 | Disposition: A | Discharge status: Home / Self Care | Payer: Medicare Other | Source: Ambulatory Visit | Attending: Neurosurgery | Admitting: Neurosurgery

## 2016-07-04 DIAGNOSIS — M542 Cervicalgia: Secondary | ICD-10-CM

## 2016-07-05 ENCOUNTER — Ambulatory Visit (INDEPENDENT_AMBULATORY_CARE_PROVIDER_SITE_OTHER): Payer: Medicare Other | Admitting: Neurology

## 2016-07-05 ENCOUNTER — Encounter: Payer: Self-pay | Admitting: Neurology

## 2016-07-05 VITALS — BP 158/96 | HR 63 | Ht 64.0 in | Wt 167.0 lb

## 2016-07-05 DIAGNOSIS — I63311 Cerebral infarction due to thrombosis of right middle cerebral artery: Secondary | ICD-10-CM

## 2016-07-05 DIAGNOSIS — M5441 Lumbago with sciatica, right side: Secondary | ICD-10-CM

## 2016-07-05 DIAGNOSIS — I69354 Hemiplegia and hemiparesis following cerebral infarction affecting left non-dominant side: Secondary | ICD-10-CM | POA: Diagnosis not present

## 2016-07-05 DIAGNOSIS — R269 Unspecified abnormalities of gait and mobility: Secondary | ICD-10-CM

## 2016-07-05 DIAGNOSIS — G8929 Other chronic pain: Secondary | ICD-10-CM

## 2016-07-05 NOTE — Progress Notes (Signed)
Chief Complaint  Patient presents with  . Pain    He is here with his mother Sherrin Daisy), brother [name]), and an interpreter (pt is deaf).  He is here for worsening pain from his neck down to his buttocks.  He is having more gait difficulty due to this pain.      PATIENT: Charles [name] DOB: 1955/05/22  HISTORICAL  BLANCA THORNTON is a 61 years old right-handed male, accompanied by his mother, and his brother, interpreter, for EMG guided Botox injection for his spastic left hemiparesis,I saw him first in Nov 2015, he was previously patients of Dr. Leonie Man, and Dr. Janann Colonel, who has performed EMG guided Botox injection in January, April, July 2015, for his spastic left hemiparesis, which he responded very well  He was born deaf, He had a history of pulmonary sarcoidosis, was treated with long-term low-dose steroid, also with past medical history of hypertension, diabetes, hyperlipidemia,  He suffered stroke in April 2014, was treated at [place], with residual severe left hemiparesis, per record  MRI of the brain showed remote right hemispheric infarct and small vessel disease type changes  MRA of brain abrupt  cutoff of flow at the right carotid terminus with nonvisualization of right middle cerebral artery branch vessels consistent with the patient's remote right hemispheric infarct. Fetal type origin of the posterior cerebral arteries with posterior cerebral artery supplied predominately from the anterior circulation. 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   2D Echocardiogram 60%, wall motion normal, LA normal size Carotid Doppler Bilateral: 1-39% ICA stenosis. Vertebral artery flow is antegrade  TEE no PFO, Pulm AVM  EEG in August 2014, normal  awake and asleep EEG. No focal or generalized epileptiform discharges noted   UPDATE May 8th 2017: He is with his mother and brother at today's clinical visit, last visit was November 25th 2015 for EMG guided  Botox injection to his spastic left upper and lower extremity, he denies significant improvement with Botox injection.  Today he came in with a new issue, complains a year history of intermittent right elbow discomfort, radiating pain to right arm, right hand, mainly involving right fifths, and fourth fingers, it happened with prolonged sitting, eating, or sleeping, he also noticed mild right hand weakness, difficulty to unscrew the bottle top. 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 He complains progressive worsening gait abnormality, falling few times, neck pain, low back pain, radiating pain to his right leg  UPDATE August 24 2015: He is accompanied by his mother brother and interpreter at today's clinical visit, he continue complains of low back pain radiating pain to right lower extremity, intermittent right arm hands paresthesia, worsening gait difficulty, urinary urgency,  We have personally reviewed MRI of cervical spine in June 2017: There is severe spinal stenosis at C3-C4 and at C4-C5 due to central disc protrusions/herniations, uncovertebral spurring, facet hypertrophy and congenitally short pedicles. There is subtle hyperintense signal within the spinal cord on sagittal STIR images just below the C3-C4 interspace but this is not confirmed on axial images. This could represent a mild cervical myelopathy. 2. There is moderate spinal stenosis at C5-C6 and C6-C7 mild spinal stenosis at C2-C3 to degenerative changes and congenitally short pedicles.  3. At C3-C4 there is moderately severe bilateral foraminal narrowing at could lead to impingement either of the C4 nerve roots. There does not appear to be nerve  root impingement at the other cervical levels  MRI of lumbar spine in June 2017: At L4-L5, there is broad disc bulging, moderately severe facet hypertrophy, right greater than left, and minimal anterolisthesis. There is no nerve root compression at this level. When compared to the MRI dated 12/24/2012, the synovial cyst that was noted to the left on the prior MRI is no longer present. This relieves the left L5 nerve root impingement that was noted at that time. However, since the prior MRI there has been mild progression of the facet hypertrophy and minimal anterolisthesis . 2. There are milder degenerative changes at L3-L4 and L5-S1 that did not lead to any nerve root impingement, unchanged when compared to the previous MRI.  UPDATE Dec 03 2015: He had cervical decompression laminectomy C3-4-5 6 with foraminotomies of the C4-5 VI nerve roots by Dr. Saintclair Halsted on October 07 2015, patient reported significant improvement in his neck pain, family noticed he has difficulty sleeping, agitated.  I personally reviewed MRI cervical spine in September 2017: Posterior hardware fusion C3 through C7. Posterior decompression C3 through C5. Small posterior fluid collection in the soft tissues may represent postop fluid versus CSF leak. Small area of cord hyperintensity at the C4 level is unchanged compatible with chronic myelomalacia. No cord compression.  He complains of low back pain, left that time pain, had personally reviewed MRI of left femur there was no significant abnormality notice, MRI lumbar in June 2017, multilevel degenerative changes, but there was no significant canal or foraminal stenosis   UPDATE Jan 16th 2018: He complains of neck pain, using heating pad, stiff, limited range of motion, lean back to get relief, difficulty to get a comfortable position to sleep, he lost drip of his right hand,  Left leg pain when bearing weight, left leg tightness,  We have personally reviewed MRI of left leg  showed no significant abnormality, MRI of lumbar in June 2017 showed mild degenerative disc disease but no significant canal or foraminal stenosis CAT scan of the brain in August 2017 showed large right MCA stroke  Update Jul 05 2016: He complains of worsening left-sided low back pain, unsteady gait, we have personally reviewed MRI of lumbar in June 2017, only mild degenerative changes, there is no significant canal or foraminal stenosis.  He is taking Mobic/naproxen as needed, worsening spastic left hemiparesis, left hands in position   REVIEW OF SYSTEMS: Full 14 system review of systems performed and notable only for as above ALLERGIES: Allergies  Allergen Reactions  . Shellfish Allergy Hives and Swelling    SWELLING REACTION UNSPECIFIED   . Hydrocodone Other (See Comments)    Makes "looney"  . Contrast Media [Iodinated Diagnostic Agents] Itching  . Oxycodone Nausea And Vomiting    HOME MEDICATIONS: Current Outpatient Prescriptions on File Prior to Visit  Medication Sig Dispense Refill  . albuterol (PROAIR HFA) 108 (90 Base) MCG/ACT inhaler Inhale 2 puffs into the lungs every 6 (six) hours as needed for wheezing or shortness of breath.    [name] aspirin 325 MG EC tablet Take 325 mg by mouth daily.    [name] atorvastatin (LIPITOR) 10 MG tablet Take 10 mg by mouth daily. (cholesterol)    . cholecalciferol (VITAMIN D) 1000 units tablet Take 1,000 Units by mouth daily.    . clopidogrel (PLAVIX) 75 MG tablet  Take 75 mg by mouth daily.    . DULoxetine (CYMBALTA) 60 MG capsule Take 1 capsule (60 mg total) by mouth daily. 30 capsule 12  . escitalopram (LEXAPRO) 10 MG tablet Take 10 mg by mouth daily.    [name] esomeprazole (NEXIUM) 40 MG capsule Take 1 capsule (40 mg total) by mouth 2 (two) times daily. 60 capsule 0  . gabapentin (NEURONTIN) 300 MG capsule Take 1 capsule (300 mg total) by mouth 3 (three) times daily. 90 capsule 11  . glucose blood (ONETOUCH VERIO) test strip 1 each by Other route daily.  And lancets 1/day 100 each 3  . hydrochlorothiazide (MICROZIDE) 12.5 MG capsule Take 12.5 mg by mouth daily.    [name] levETIRAcetam (KEPPRA) 500 MG tablet Take 1 tablet (500 mg total) by mouth 2 (two) times daily. 60 tablet 0  . losartan (COZAAR) 50 MG tablet Take 50 mg by mouth daily.    . meloxicam (MOBIC) 7.5 MG tablet   1  . mometasone-formoterol (DULERA) 100-5 MCG/ACT AERO Inhale 2 puffs into the lungs every 12 (twelve) hours. 1 Inhaler 11  . naproxen (NAPROSYN) 500 MG tablet Take 500 mg by mouth 2 (two) times daily with a meal.  0  . olmesartan (BENICAR) 20 MG tablet Take 20 mg by mouth daily.    [name] oxybutynin (DITROPAN XL) 15 MG 24 hr tablet Take 15 mg by mouth at bedtime.    . predniSONE (DELTASONE) 1 MG tablet Take 1 mg by mouth daily with breakfast.    . sitaGLIPtin (JANUVIA) 100 MG tablet Take 100 mg by mouth daily.    . sitaGLIPtin-metformin (JANUMET) 50-500 MG tablet Take 1 tablet by mouth 2 (two) times daily with a meal.    . Tiotropium Bromide Monohydrate (SPIRIVA HANDIHALER IN) Inhale into the lungs.    [name] tiZANidine (ZANAFLEX) 2 MG tablet Take 1 tablet (2 mg total) by mouth 3 (three) times daily. 60 tablet 6  . traMADol (ULTRAM) 50 MG tablet Take 1 tablet (50 mg total) by mouth every 8 (eight) hours as needed. (Patient taking differently: Take 50 mg by mouth every 8 (eight) hours as needed (pain). ) 60 tablet 3   Current Facility-Administered Medications on File Prior to Visit  Medication Dose Route Frequency Provider Last Rate Last Dose  . botulinum toxin Type A (BOTOX) injection 300 Units  300 Units Intramuscular Once Drema Dallas, DO        PAST MEDICAL HISTORY: Past Medical History:  Diagnosis Date  . Anemia   . Asthma   . Deaf   . Essential hypertension, benign   . GERD (gastroesophageal reflux disease)   . Headache    migraines in the past per pt  . Hearing impaired   . History of stroke    Spastic hemiplegia, right MCA stroke April 2014 in [place]  . Mixed  hyperlipidemia   . Other pancytopenia ([place]) 12/23/2015  . PAD (peripheral artery disease) ([place])   . Sarcoidosis   . Seizures ([place])   . Stroke [facility])    2014, left sided weakness  . Type 2 diabetes mellitus ([place])     PAST SURGICAL HISTORY: Past Surgical History:  Procedure Laterality Date  . BRAVO PH STUDY N/A 06/18/2013   pH STUDY SHOWS NEXIUM TWICE DAILY CONTROLS THE ACID IN HIS STOMACH. HE HAD VERY FEWEPISODE OF REGURGITATION RECORDED IN THE 2 DAYS THE STUDY WAS PERFORMED.   [name] COLONOSCOPY  2011   [place]  . ESOPHAGOGASTRODUODENOSCOPY N/A 06/18/2013  Dr.Fields- probable proximal esophageal web,dilation performed, bravo cap placed, mild non-erosive gastritis in the gastric antrum and on the greater curvature of the gastric body bx- granulomatous gastritis, duodenal mucosa showed no abnormalities in the bulb and second portion of the duodenum.   . ESOPHAGOGASTRODUODENOSCOPY N/A 05/05/2015   Procedure: ESOPHAGOGASTRODUODENOSCOPY (EGD);  Surgeon: Danie Binder, MD;  Location: [facility];  Service: Endoscopy;  Laterality: N/A;  730  . MALONEY DILATION N/A 06/18/2013   Procedure: MALONEY DILATION;  Surgeon: Danie Binder, MD;  Location: [facility];  Service: Endoscopy;  Laterality: N/A;  . OTHER SURGICAL HISTORY     cyst removal head and chest  . POSTERIOR CERVICAL FUSION/FORAMINOTOMY N/A 10/07/2015   Procedure: Cervical Three-Four, Cervical Four-Five, Cervical Five-Six, Cervical Six-Seven Posterior Cervical Laminectomy and Fusion;  Surgeon: Kary Kos, MD;  Location: [place] [facility]UR[facility] OR[facility];  Service: Neurosurgery;  Laterality: N/A;  . SAVORY DILATION N/A 06/18/2013   Procedure: SAVORY DILATION;  Surgeon: Danie Binder, MD;  Location: [facility];  Service: Endoscopy;  Laterality: N/A;  . TEE WITHOUT CARDIOVERSION N/A 10/04/2012   Procedure: TRANSESOPHAGEAL ECHOCARDIOGRAM (TEE);  Surgeon: Thayer Headings, MD;  Location: [facility] ENDOSCOPY;  Service: Cardiovascular;  Laterality: N/A;  . [place] accident  Both legs fractured and repaired surgically    FAMILY HISTORY: Family History  Problem Relation Age of Onset  . Diabetes Mother   . Hypertension Mother   . Diabetes Father   . Hypertension Father   . Colon polyps Neg Hx   . Colon cancer Neg Hx     SOCIAL HISTORY:  Social History   Social History  . Marital status: Single    Spouse name: N/A  . Number of children: 0  . Years of education: HS   Occupational History  . UNEMPLOYED    Social History Main Topics  . Smoking status: Former Smoker    Packs/day: 1.00    Years: 29.00    Types: Cigarettes    Quit date: 02/22/1992  . Smokeless tobacco: Never Used  . Alcohol use No  . Drug use: Yes     Comment: hx of crack cocaine "long time ago" per pt  . Sexual activity: No   Other Topics Concern  . Not on file   Social History Narrative   Patient lives at home with his family. Brother, sister, mother    Patient does not work.    Patient has a high school education.    Patient has one step child      PHYSICAL EXAM   Vitals:   07/05/16 0720  BP: (!) 158/96  Pulse: 63  Weight: 167 lb (75.8 kg)  Height: 5\' 4"  (1.626 m)    Not recorded      Body mass index is 28.67 kg/m.   PHYSICAL EXAMNIATION:  Gen: NAD, conversant, well nourised, obese, well groomed                     Cardiovascular: Regular rate rhythm, no peripheral edema, warm, nontender. Eyes: Conjunctivae clear without exudates or hemorrhage Neck: Supple, no carotid bruise. Pulmonary: Clear to auscultation bilaterally   NEUROLOGICAL EXAM:  MENTAL STATUS: Speech: He is mute depend on interpreter for history,   CRANIAL NERVES: CN II: Visual fields are full to confrontation. Fundoscopic exam is normal with sharp discs and no vascular changes. Pupils are round equal and briskly reactive to light. CN III, IV, VI: extraocular movement  are normal. No ptosis. CN V: Facial sensation is intact to pinprick in all 3  divisions bilaterally. Corneal responses are intact.  CN VII: Face is symmetric with normal eye closure and smile. CN VIII: deaf CN IX, X: Palate elevates symmetrically. Phonation is normal. CN XI: Head turning and shoulder shrug are intact CN XII: Tongue is midline with normal movements and no atrophy.  MOTOR: Spastic left hemiparesis, wearing left ankle brace, mild left shoulder antigravity movement left shoulder abduction with passive movement maximum 90, left elbow 170, complains of left elbow pain with passive movement, left thumb in position, left hip flexion 3, left knee extension 4, left knee flexion 4, he also has mild right ankle dorsiflexion weakness.  He has right elbow Tinel signs, mild right finger abduction weakness, mild right hand grip weakness  REFLEXES:  Hyperreflexia of both side, worse on the left side, bilateral Babinski sign.  SENSORY: Intact to light touch, pinprick, positional and vibratory sensation are intact in fingers and toes, with exception of decreased light touch in the right fifth, and half of right fourth finger extending to right ulnar palmar, dorsum of right hand  COORDINATION: There is no dysmetria on right finger-to-nose and heel-knee-shin.    GAIT/STANCE: Rely on his 4 foot cane, multiple tends to get up from seated position, left hemi-circumferential, unsteady gait      Lab Results  Component Value Date   WBC 5.8 12/29/2015   HGB 12.3 (L) 12/29/2015   HCT 36.1 (L) 12/29/2015   MCV 87.0 12/29/2015   PLT 125 (L) 12/29/2015      Component Value Date/Time   NA 136 12/29/2015 2039   K 4.1 12/29/2015 2039   CL 103 12/29/2015 2039   CO2 30 12/29/2015 2039   GLUCOSE 127 (H) 12/29/2015 2039   BUN 9 12/29/2015 2039   CREATININE 1.08 12/29/2015 2039   CALCIUM 8.9 12/29/2015 2039   PROT 6.9 12/29/2015 2039   ALBUMIN 3.8 12/29/2015 2039   AST 24 12/29/2015 2039   ALT 23 12/29/2015 2039   ALKPHOS 77 12/29/2015 2039   BILITOT 0.3 12/29/2015  2039   GFRNONAA >60 12/29/2015 2039   GFRAA >60 12/29/2015 2039   Lab Results  Component Value Date   CHOL 164 11/30/2012   HDL 79 11/30/2012   LDLCALC 77 11/30/2012   TRIG 42 11/30/2012   CHOLHDL 2.1 11/30/2012   Lab Results  Component Value Date   HGBA1C 5.1 09/30/2015   Lab Results  Component Value Date   VITAMINB12 399 05/22/2015   Lab Results  Component Value Date   TSH 2.38 11/12/2015      ASSESSMENT AND PLAN  FAVOR HACKLER is a 61 y.o. male  History of stroke, with residual spastic left hemiparesis  Reinitiate electrical stimulation guided xeomin injection, ask for 500 units  Cervical spondylitic myelopathy  Status post cervical decompression surgery in August 2017, Continued to complains of neck pain  I have encouraged him neck stretching exercise, hot compression  Continue gabapentin 300 mg 3 times a day  Tizanidine 2 mg 3 times a day as needed,  Tramadol, Mobic as needed for pain  Keep Cymbalta 60 mg daily  Return to clinic in 4 week  Low back pain, left anterior thigh and hip pain, gait abnormality  Musculoskeletal etiology  Continue hot compression  Mobic 7.5 milligrams twice a day,  Tramadol as needed  Refer him to physical therapy  Congenital deaf, use sign language, history is through interpreter  Marcial Pacas, M.D. Ph.D.  [facility] [address] [place], [place] [address] [phone]

## 2016-07-26 ENCOUNTER — Encounter: Payer: Self-pay | Admitting: Neurology

## 2016-07-26 ENCOUNTER — Ambulatory Visit (INDEPENDENT_AMBULATORY_CARE_PROVIDER_SITE_OTHER): Payer: Medicare Other | Admitting: Neurology

## 2016-07-26 VITALS — BP 147/82 | HR 78 | Ht 64.0 in | Wt 167.0 lb

## 2016-07-26 DIAGNOSIS — I69354 Hemiplegia and hemiparesis following cerebral infarction affecting left non-dominant side: Secondary | ICD-10-CM | POA: Diagnosis not present

## 2016-07-26 MED ORDER — INCOBOTULINUMTOXINA 100 UNITS IM SOLR
500.0000 [IU] | INTRAMUSCULAR | Status: DC
  Administered 2016-07-26: 500 [IU] via INTRAMUSCULAR

## 2016-07-26 NOTE — Progress Notes (Signed)
Chief Complaint  Patient presents with  . Spastic Hemiplegia    [name] - office supply      PATIENT: Charles [name] DOB: Jul 02, 1955  HISTORICAL  Charles [name] is a 61 years old right-handed male, accompanied by his mother, and his brother, interpreter, for EMG guided Botox injection for his spastic left hemiparesis,I saw him first in Nov 2015, he was previously patients of Dr. Leonie Man, and Dr. Janann Colonel, who has performed EMG guided Botox injection in January, April, July 2015, for his spastic left hemiparesis, which he responded very well  He was born deaf, He had a history of pulmonary sarcoidosis, was treated with long-term low-dose steroid, also with past medical history of hypertension, diabetes, hyperlipidemia,  He suffered stroke in April 2014, was treated at [place], with residual severe left hemiparesis, per record  MRI of the brain showed remote right hemispheric infarct and small vessel disease type changes  MRA of brain abrupt  cutoff of flow at the right carotid terminus with nonvisualization of right middle cerebral artery branch vessels consistent with the patient's remote right hemispheric infarct. Fetal type origin of the posterior cerebral arteries with posterior cerebral artery supplied predominately from the anterior circulation. 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   2D Echocardiogram 60%, wall motion normal, LA normal size Carotid Doppler Bilateral: 1-39% ICA stenosis. Vertebral artery flow is antegrade  TEE no PFO, Pulm AVM  EEG in August 2014, normal  awake and asleep EEG. No focal or generalized epileptiform discharges noted   UPDATE May 8th 2017: He is with his mother and brother at today's clinical visit, last visit was November 25th 2015 for EMG guided Botox injection to his spastic left upper and lower extremity, he denies significant improvement with Botox injection.  Today he came in with a new issue, complains a year  history of intermittent right elbow discomfort, radiating pain to right arm, right hand, mainly involving right fifths, and fourth fingers, it happened with prolonged sitting, eating, or sleeping, he also noticed mild right hand weakness, difficulty to unscrew the bottle top. 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 He complains progressive worsening gait abnormality, falling few times, neck pain, low back pain, radiating pain to his right leg  UPDATE August 24 2015: He is accompanied by his mother brother and interpreter at today's clinical visit, he continue complains of low back pain radiating pain to right lower extremity, intermittent right arm hands paresthesia, worsening gait difficulty, urinary urgency,  We have personally reviewed MRI of cervical spine in June 2017: There is severe spinal stenosis at C3-C4 and at C4-C5 due to central disc protrusions/herniations, uncovertebral spurring, facet hypertrophy and congenitally short pedicles. There is subtle hyperintense signal within the spinal cord on sagittal STIR images just below the C3-C4 interspace but this is not confirmed on axial images. This could represent a mild cervical myelopathy. 2. There is moderate spinal stenosis at C5-C6 and C6-C7 mild spinal stenosis at C2-C3 to degenerative changes and congenitally short pedicles. 3. At C3-C4 there is moderately severe bilateral foraminal narrowing at could lead to impingement either of the C4 nerve roots. There does not appear to be nerve root impingement at the other cervical levels  MRI of lumbar spine in June 2017: At L4-L5, there is broad disc bulging, moderately severe facet hypertrophy, right greater  than left, and minimal anterolisthesis. There is no nerve root compression at this level. When compared to the MRI dated 12/24/2012, the synovial cyst that was noted to the left on the prior MRI is no longer present. This relieves the left L5 nerve root impingement that was noted at that time. However, since the prior MRI there has been mild progression of the facet hypertrophy and minimal anterolisthesis . 2. There are milder degenerative changes at L3-L4 and L5-S1 that did not lead to any nerve root impingement, unchanged when compared to the previous MRI.  UPDATE Dec 03 2015: He had cervical decompression laminectomy C3-4-5 6 with foraminotomies of the C4-5 VI nerve roots by Dr. Saintclair Halsted on October 07 2015, patient reported significant improvement in his neck pain, family noticed he has difficulty sleeping, agitated.  I personally reviewed MRI cervical spine in September 2017: Posterior hardware fusion C3 through C7. Posterior decompression C3 through C5. Small posterior fluid collection in the soft tissues may represent postop fluid versus CSF leak. Small area of cord hyperintensity at the C4 level is unchanged compatible with chronic myelomalacia. No cord compression.  He complains of low back pain, left that time pain, had personally reviewed MRI of left femur there was no significant abnormality notice, MRI lumbar in June 2017, multilevel degenerative changes, but there was no significant canal or foraminal stenosis   UPDATE Jan 16th 2018: He complains of neck pain, using heating pad, stiff, limited range of motion, lean back to get relief, difficulty to get a comfortable position to sleep, he lost drip of his right hand,  Left leg pain when bearing weight, left leg tightness,  We have personally reviewed MRI of left leg showed no significant abnormality, MRI of lumbar in June 2017 showed mild degenerative disc disease but no significant canal or foraminal stenosis CAT scan of the brain in  August 2017 showed large right MCA stroke  Update Jul 05 2016: He complains of worsening left-sided low back pain, unsteady gait, we have personally reviewed MRI of lumbar in June 2017, only mild degenerative changes, there is no significant canal or foraminal stenosis.  He is taking Mobic/naproxen as needed, worsening spastic left hemiparesis, left hands in position   UPDATE June 5th 2018: He returned for a electronic stimulation guided xeomin injection for spastic left hemiparesis, was emphasize on left upper extremity today,  REVIEW OF SYSTEMS: Full 14 system review of systems performed and notable only for as above ALLERGIES: Allergies  Allergen Reactions  . Shellfish Allergy Hives and Swelling    SWELLING REACTION UNSPECIFIED   . Hydrocodone Other (See Comments)    Makes "looney"  . Contrast Media [Iodinated Diagnostic Agents] Itching  . Oxycodone Nausea And Vomiting    HOME MEDICATIONS: Current Outpatient Prescriptions on File Prior to Visit  Medication Sig Dispense Refill  . albuterol (PROAIR HFA) 108 (90 Base) MCG/ACT inhaler Inhale 2 puffs into the lungs every 6 (six) hours as needed for wheezing or shortness of breath.    [name] aspirin 325 MG EC tablet Take 325 mg by mouth daily.    [name] atorvastatin (LIPITOR) 10 MG tablet Take 10 mg by mouth daily. (cholesterol)    . cholecalciferol (VITAMIN D) 1000 units tablet Take 1,000 Units by mouth daily.    . clopidogrel (PLAVIX) 75 MG tablet Take 75 mg by mouth daily.    . DULoxetine (  CYMBALTA) 60 MG capsule Take 1 capsule (60 mg total) by mouth daily. 30 capsule 12  . escitalopram (LEXAPRO) 10 MG tablet Take 10 mg by mouth daily.    [name] esomeprazole (NEXIUM) 40 MG capsule Take 1 capsule (40 mg total) by mouth 2 (two) times daily. 60 capsule 0  . gabapentin (NEURONTIN) 300 MG capsule Take 1 capsule (300 mg total) by mouth 3 (three) times daily. 90 capsule 11  . glucose blood (ONETOUCH VERIO) test strip 1 each by Other route daily. And  lancets 1/day 100 each 3  . hydrochlorothiazide (MICROZIDE) 12.5 MG capsule Take 12.5 mg by mouth daily.    [name] levETIRAcetam (KEPPRA) 500 MG tablet Take 1 tablet (500 mg total) by mouth 2 (two) times daily. 60 tablet 0  . losartan (COZAAR) 50 MG tablet Take 50 mg by mouth daily.    . meloxicam (MOBIC) 7.5 MG tablet   1  . mometasone-formoterol (DULERA) 100-5 MCG/ACT AERO Inhale 2 puffs into the lungs every 12 (twelve) hours. 1 Inhaler 11  . naproxen (NAPROSYN) 500 MG tablet Take 500 mg by mouth 2 (two) times daily with a meal.  0  . olmesartan (BENICAR) 20 MG tablet Take 20 mg by mouth daily.    [name] oxybutynin (DITROPAN XL) 15 MG 24 hr tablet Take 15 mg by mouth at bedtime.    . predniSONE (DELTASONE) 1 MG tablet Take 1 mg by mouth daily with breakfast.    . sitaGLIPtin (JANUVIA) 100 MG tablet Take 100 mg by mouth daily.    . sitaGLIPtin-metformin (JANUMET) 50-500 MG tablet Take 1 tablet by mouth 2 (two) times daily with a meal.    . Tiotropium Bromide Monohydrate (SPIRIVA HANDIHALER IN) Inhale into the lungs.    [name] tiZANidine (ZANAFLEX) 2 MG tablet Take 1 tablet (2 mg total) by mouth 3 (three) times daily. 60 tablet 6  . traMADol (ULTRAM) 50 MG tablet Take 1 tablet (50 mg total) by mouth every 8 (eight) hours as needed. (Patient taking differently: Take 50 mg by mouth every 8 (eight) hours as needed (pain). ) 60 tablet 3   No current facility-administered medications on file prior to visit.     PAST MEDICAL HISTORY: Past Medical History:  Diagnosis Date  . Anemia   . Asthma   . Deaf   . Essential hypertension, benign   . GERD (gastroesophageal reflux disease)   . Headache    migraines in the past per pt  . Hearing impaired   . History of stroke    Spastic hemiplegia, right MCA stroke April 2014 in [place]  . Mixed hyperlipidemia   . Other pancytopenia ([place]) 12/23/2015  . PAD (peripheral artery disease) ([place])   . Sarcoidosis   . Seizures ([place])   . Stroke [facility])    2014, left  sided weakness  . Type 2 diabetes mellitus ([place])     PAST SURGICAL HISTORY: Past Surgical History:  Procedure Laterality Date  . BRAVO PH STUDY N/A 06/18/2013   pH STUDY SHOWS NEXIUM TWICE DAILY CONTROLS THE ACID IN HIS STOMACH. HE HAD VERY FEWEPISODE OF REGURGITATION RECORDED IN THE 2 DAYS THE STUDY WAS PERFORMED.   [name] COLONOSCOPY  2011   IN [place]  . ESOPHAGOGASTRODUODENOSCOPY N/A 06/18/2013   Dr.Fields- probable proximal esophageal web,dilation performed, bravo cap placed, mild non-erosive gastritis in the gastric antrum and on the greater curvature of the gastric body bx- granulomatous gastritis, duodenal mucosa showed no abnormalities in the bulb and second portion of the  duodenum.   . ESOPHAGOGASTRODUODENOSCOPY N/A 05/05/2015   Procedure: ESOPHAGOGASTRODUODENOSCOPY (EGD);  Surgeon: Danie Binder, MD;  Location: [facility];  Service: Endoscopy;  Laterality: N/A;  730  . MALONEY DILATION N/A 06/18/2013   Procedure: MALONEY DILATION;  Surgeon: Danie Binder, MD;  Location: [facility];  Service: Endoscopy;  Laterality: N/A;  . OTHER SURGICAL HISTORY     cyst removal head and chest  . POSTERIOR CERVICAL FUSION/FORAMINOTOMY N/A 10/07/2015   Procedure: Cervical Three-Four, Cervical Four-Five, Cervical Five-Six, Cervical Six-Seven Posterior Cervical Laminectomy and Fusion;  Surgeon: Kary Kos, MD;  Location: [facility];  Service: Neurosurgery;  Laterality: N/A;  . SAVORY DILATION N/A 06/18/2013   Procedure: SAVORY DILATION;  Surgeon: Danie Binder, MD;  Location: [facility];  Service: Endoscopy;  Laterality: N/A;  . TEE WITHOUT CARDIOVERSION N/A 10/04/2012   Procedure: TRANSESOPHAGEAL ECHOCARDIOGRAM (TEE);  Surgeon: Thayer Headings, MD;  Location: [facility] ENDOSCOPY;  Service: Cardiovascular;  Laterality: N/A;  . [place] accident  Both legs fractured and repaired surgically    FAMILY HISTORY: Family History  Problem Relation Age of Onset  . Diabetes Mother    . Hypertension Mother   . Diabetes Father   . Hypertension Father   . Colon polyps Neg Hx   . Colon cancer Neg Hx     SOCIAL HISTORY:  Social History   Social History  . Marital status: Single    Spouse name: N/A  . Number of children: 0  . Years of education: HS   Occupational History  . UNEMPLOYED    Social History Main Topics  . Smoking status: Former Smoker    Packs/day: 1.00    Years: 29.00    Types: Cigarettes    Quit date: 02/22/1992  . Smokeless tobacco: Never Used  . Alcohol use No  . Drug use: Yes     Comment: hx of crack cocaine "long time ago" per pt  . Sexual activity: No   Other Topics Concern  . Not on file   Social History Narrative   Patient lives at home with his family. Brother, sister, mother    Patient does not work.    Patient has a high school education.    Patient has one step child      PHYSICAL EXAM   Vitals:   07/26/16 1345  BP: (!) 147/82  Pulse: 78  Weight: 167 lb (75.8 kg)  Height: 5\' 4"  (1.626 m)    Not recorded      Body mass index is 28.67 kg/m.   PHYSICAL EXAMNIATION:  Gen: NAD, conversant, well nourised, obese, well groomed                     Cardiovascular: Regular rate rhythm, no peripheral edema, warm, nontender. Eyes: Conjunctivae clear without exudates or hemorrhage Neck: Supple, no carotid bruise. Pulmonary: Clear to auscultation bilaterally   NEUROLOGICAL EXAM:  MENTAL STATUS: Speech: He is mute depend on interpreter for history,   CRANIAL NERVES: CN II: Visual fields are full to confrontation. Fundoscopic exam is normal with sharp discs and no vascular changes. Pupils are round equal and briskly reactive to light. CN III, IV, VI: extraocular movement are normal. No ptosis. CN V: Facial sensation is intact to pinprick in all 3 divisions bilaterally. Corneal responses are intact.  CN VII: Face is symmetric with normal eye closure and smile. CN VIII: deaf CN IX, X: Palate elevates  symmetrically.  Phonation is normal. CN XI: Head turning and shoulder shrug are intact CN XII: Tongue is midline with normal movements and no atrophy.  MOTOR: Spastic left hemiparesis, wearing left ankle brace, mild left shoulder antigravity movement left shoulder abduction with passive movement maximum 90, left elbow 170, complains of left elbow pain with passive movement, left thumb in position, left hip flexion 3, left knee extension 4, left knee flexion 4, he also has mild right ankle dorsiflexion weakness.  He has right elbow Tinel signs, mild right finger abduction weakness, mild right hand grip weakness  REFLEXES:  Hyperreflexia of both side, worse on the left side, bilateral Babinski sign.  SENSORY: Intact to light touch, pinprick, positional and vibratory sensation are intact in fingers and toes, with exception of decreased light touch in the right fifth, and half of right fourth finger extending to right ulnar palmar, dorsum of right hand  COORDINATION: There is no dysmetria on right finger-to-nose and heel-knee-shin.    GAIT/STANCE: Rely on his 4 foot cane, multiple tends to get up from seated position, left hemi-circumferential, unsteady gait      Lab Results  Component Value Date   WBC 5.8 12/29/2015   HGB 12.3 (L) 12/29/2015   HCT 36.1 (L) 12/29/2015   MCV 87.0 12/29/2015   PLT 125 (L) 12/29/2015      Component Value Date/Time   NA 136 12/29/2015 2039   K 4.1 12/29/2015 2039   CL 103 12/29/2015 2039   CO2 30 12/29/2015 2039   GLUCOSE 127 (H) 12/29/2015 2039   BUN 9 12/29/2015 2039   CREATININE 1.08 12/29/2015 2039   CALCIUM 8.9 12/29/2015 2039   PROT 6.9 12/29/2015 2039   ALBUMIN 3.8 12/29/2015 2039   AST 24 12/29/2015 2039   ALT 23 12/29/2015 2039   ALKPHOS 77 12/29/2015 2039   BILITOT 0.3 12/29/2015 2039   GFRNONAA >60 12/29/2015 2039   GFRAA >60 12/29/2015 2039   Lab Results  Component Value Date   CHOL 164 11/30/2012   HDL 79 11/30/2012   LDLCALC 77  11/30/2012   TRIG 42 11/30/2012   CHOLHDL 2.1 11/30/2012   Lab Results  Component Value Date   HGBA1C 5.1 09/30/2015   Lab Results  Component Value Date   VITAMINB12 399 05/22/2015   Lab Results  Component Value Date   TSH 2.38 11/12/2015      ASSESSMENT AND PLAN  RISHON THILGES is a 61 y.o. male    Cervical spondylitic myelopathy  Status post cervical decompression surgery in August 2017, Continued to complains of neck pain  I have encouraged him neck stretching exercise, hot compression  Continue gabapentin 300 mg 3 times a day  Tizanidine 2 mg 3 times a day as needed,  Tramadol, Mobic as needed for pain  Keep Cymbalta 60 mg daily  Return to clinic in 4 week  Low back pain, left anterior thigh and hip pain, gait abnormality  Musculoskeletal etiology  Continue hot compression  Mobic 7.5 milligrams twice a day,  Tramadol as needed  Refer him to physical therapy  Congenital deaf, use sign language, history is through interpreter   History of stroke, with residual spastic left hemiparesis  electrical stimulation guided xeomin injection, used 500 units  Left brachialis 100 units Left pronator teres 100 units Left palmaris longus 25 units Left flexor digitorum superficialis 25 units Left flexor digitorum profundus right 25 units Left flexor carpi ulnaris 75 units Left flexor carpi radialis 50 units  Left  pectoralis major 75 units Left teres major 25 units   He will return to clinic in 3 months for repeat injection  Charles [name], M.D. Ph.D.  [facility] [address], [place], [place] [address] [phone]

## 2016-07-26 NOTE — Progress Notes (Signed)
**  [name] 100 units x 5 vials, NDC 0259-1610-01, Lot [id number], Exp 10/2018, office supply.//[email or web address],rn** 

## 2016-07-28 ENCOUNTER — Ambulatory Visit: Payer: Medicare Other | Admitting: Podiatry

## 2016-07-28 ENCOUNTER — Ambulatory Visit (HOSPITAL_COMMUNITY): Payer: Medicare Other | Admitting: Physical Therapy

## 2016-08-03 ENCOUNTER — Ambulatory Visit (HOSPITAL_COMMUNITY): Payer: Medicare Other | Admitting: Physical Therapy

## 2016-08-03 ENCOUNTER — Telehealth (HOSPITAL_COMMUNITY): Payer: Self-pay | Admitting: Internal Medicine

## 2016-08-03 NOTE — Telephone Encounter (Signed)
08/03/16 caregiver left a message to cx today's appt due to illness

## 2016-08-04 ENCOUNTER — Ambulatory Visit: Payer: Medicare Other | Admitting: Neurology

## 2016-08-16 ENCOUNTER — Encounter: Payer: Self-pay | Admitting: Gastroenterology

## 2016-08-17 DIAGNOSIS — Z6824 Body mass index (BMI) 24.0-24.9, adult: Secondary | ICD-10-CM | POA: Diagnosis not present

## 2016-08-17 DIAGNOSIS — R569 Unspecified convulsions: Secondary | ICD-10-CM | POA: Diagnosis not present

## 2016-08-17 DIAGNOSIS — E7800 Pure hypercholesterolemia, unspecified: Secondary | ICD-10-CM | POA: Diagnosis not present

## 2016-08-17 DIAGNOSIS — Z299 Encounter for prophylactic measures, unspecified: Secondary | ICD-10-CM | POA: Diagnosis not present

## 2016-08-17 DIAGNOSIS — G8110 Spastic hemiplegia affecting unspecified side: Secondary | ICD-10-CM | POA: Diagnosis not present

## 2016-08-17 DIAGNOSIS — I679 Cerebrovascular disease, unspecified: Secondary | ICD-10-CM | POA: Diagnosis not present

## 2016-08-17 DIAGNOSIS — I10 Essential (primary) hypertension: Secondary | ICD-10-CM | POA: Diagnosis not present

## 2016-08-17 DIAGNOSIS — I639 Cerebral infarction, unspecified: Secondary | ICD-10-CM | POA: Diagnosis not present

## 2016-08-17 DIAGNOSIS — M549 Dorsalgia, unspecified: Secondary | ICD-10-CM | POA: Diagnosis not present

## 2016-08-17 DIAGNOSIS — I739 Peripheral vascular disease, unspecified: Secondary | ICD-10-CM | POA: Diagnosis not present

## 2016-08-17 DIAGNOSIS — J45909 Unspecified asthma, uncomplicated: Secondary | ICD-10-CM | POA: Diagnosis not present

## 2016-08-17 DIAGNOSIS — E1165 Type 2 diabetes mellitus with hyperglycemia: Secondary | ICD-10-CM | POA: Diagnosis not present

## 2016-08-19 ENCOUNTER — Ambulatory Visit (HOSPITAL_COMMUNITY): Payer: Medicare Other | Attending: Neurology | Admitting: Physical Therapy

## 2016-09-08 ENCOUNTER — Ambulatory Visit: Payer: Medicare Other | Admitting: Nurse Practitioner

## 2016-09-12 ENCOUNTER — Observation Stay (HOSPITAL_COMMUNITY): Payer: Medicare Other

## 2016-09-12 ENCOUNTER — Observation Stay (HOSPITAL_COMMUNITY)
Admission: EM | Admit: 2016-09-12 | Discharge: 2016-09-14 | Disposition: A | Discharge status: Home / Self Care | Payer: Medicare Other | Attending: Internal Medicine | Admitting: Internal Medicine

## 2016-09-12 ENCOUNTER — Emergency Department (HOSPITAL_COMMUNITY): Payer: Medicare Other

## 2016-09-12 ENCOUNTER — Encounter (HOSPITAL_COMMUNITY): Payer: Self-pay | Admitting: Emergency Medicine

## 2016-09-12 DIAGNOSIS — G40909 Epilepsy, unspecified, not intractable, without status epilepticus: Secondary | ICD-10-CM

## 2016-09-12 DIAGNOSIS — I63311 Cerebral infarction due to thrombosis of right middle cerebral artery: Secondary | ICD-10-CM | POA: Diagnosis not present

## 2016-09-12 DIAGNOSIS — E11649 Type 2 diabetes mellitus with hypoglycemia without coma: Secondary | ICD-10-CM | POA: Diagnosis not present

## 2016-09-12 DIAGNOSIS — R51 Headache: Secondary | ICD-10-CM

## 2016-09-12 DIAGNOSIS — G44229 Chronic tension-type headache, not intractable: Secondary | ICD-10-CM

## 2016-09-12 DIAGNOSIS — R079 Chest pain, unspecified: Secondary | ICD-10-CM | POA: Diagnosis not present

## 2016-09-12 DIAGNOSIS — I69398 Other sequelae of cerebral infarction: Secondary | ICD-10-CM

## 2016-09-12 DIAGNOSIS — R404 Transient alteration of awareness: Secondary | ICD-10-CM | POA: Diagnosis not present

## 2016-09-12 DIAGNOSIS — J449 Chronic obstructive pulmonary disease, unspecified: Secondary | ICD-10-CM | POA: Insufficient documentation

## 2016-09-12 DIAGNOSIS — F332 Major depressive disorder, recurrent severe without psychotic features: Secondary | ICD-10-CM | POA: Diagnosis not present

## 2016-09-12 DIAGNOSIS — M5441 Lumbago with sciatica, right side: Secondary | ICD-10-CM | POA: Diagnosis not present

## 2016-09-12 DIAGNOSIS — R911 Solitary pulmonary nodule: Secondary | ICD-10-CM | POA: Diagnosis present

## 2016-09-12 DIAGNOSIS — R29818 Other symptoms and signs involving the nervous system: Secondary | ICD-10-CM | POA: Diagnosis not present

## 2016-09-12 DIAGNOSIS — E1151 Type 2 diabetes mellitus with diabetic peripheral angiopathy without gangrene: Secondary | ICD-10-CM | POA: Diagnosis not present

## 2016-09-12 DIAGNOSIS — J45909 Unspecified asthma, uncomplicated: Secondary | ICD-10-CM | POA: Diagnosis not present

## 2016-09-12 DIAGNOSIS — D869 Sarcoidosis, unspecified: Secondary | ICD-10-CM

## 2016-09-12 DIAGNOSIS — M545 Low back pain, unspecified: Secondary | ICD-10-CM | POA: Diagnosis present

## 2016-09-12 DIAGNOSIS — M4802 Spinal stenosis, cervical region: Secondary | ICD-10-CM | POA: Diagnosis present

## 2016-09-12 DIAGNOSIS — I10 Essential (primary) hypertension: Secondary | ICD-10-CM | POA: Diagnosis not present

## 2016-09-12 DIAGNOSIS — G459 Transient cerebral ischemic attack, unspecified: Secondary | ICD-10-CM | POA: Diagnosis not present

## 2016-09-12 DIAGNOSIS — R531 Weakness: Secondary | ICD-10-CM | POA: Diagnosis present

## 2016-09-12 DIAGNOSIS — R4701 Aphasia: Principal | ICD-10-CM | POA: Insufficient documentation

## 2016-09-12 DIAGNOSIS — Z87891 Personal history of nicotine dependence: Secondary | ICD-10-CM | POA: Insufficient documentation

## 2016-09-12 DIAGNOSIS — Z862 Personal history of diseases of the blood and blood-forming organs and certain disorders involving the immune mechanism: Secondary | ICD-10-CM | POA: Insufficient documentation

## 2016-09-12 DIAGNOSIS — R55 Syncope and collapse: Secondary | ICD-10-CM | POA: Diagnosis present

## 2016-09-12 DIAGNOSIS — R519 Headache, unspecified: Secondary | ICD-10-CM | POA: Diagnosis present

## 2016-09-12 DIAGNOSIS — I639 Cerebral infarction, unspecified: Secondary | ICD-10-CM | POA: Diagnosis present

## 2016-09-12 DIAGNOSIS — H913 Deaf nonspeaking, not elsewhere classified: Secondary | ICD-10-CM | POA: Diagnosis present

## 2016-09-12 DIAGNOSIS — R0602 Shortness of breath: Secondary | ICD-10-CM | POA: Diagnosis not present

## 2016-09-12 DIAGNOSIS — E162 Hypoglycemia, unspecified: Secondary | ICD-10-CM

## 2016-09-12 DIAGNOSIS — G8114 Spastic hemiplegia affecting left nondominant side: Secondary | ICD-10-CM | POA: Diagnosis present

## 2016-09-12 DIAGNOSIS — D696 Thrombocytopenia, unspecified: Secondary | ICD-10-CM

## 2016-09-12 DIAGNOSIS — E119 Type 2 diabetes mellitus without complications: Secondary | ICD-10-CM

## 2016-09-12 LAB — COMPREHENSIVE METABOLIC PANEL
ALT: 25 U/L (ref 17–63)
AST: 27 U/L (ref 15–41)
Albumin: 4.2 g/dL (ref 3.5–5.0)
Alkaline Phosphatase: 60 U/L (ref 38–126)
Anion gap: 8 (ref 5–15)
BILIRUBIN TOTAL: 0.6 mg/dL (ref 0.3–1.2)
BUN: 15 mg/dL (ref 6–20)
CHLORIDE: 97 mmol/L — AB (ref 101–111)
CO2: 31 mmol/L (ref 22–32)
CREATININE: 1.32 mg/dL — AB (ref 0.61–1.24)
Calcium: 8.8 mg/dL — ABNORMAL LOW (ref 8.9–10.3)
GFR, EST NON AFRICAN AMERICAN: 56 mL/min — AB (ref >60)
Glucose, Bld: 74 mg/dL (ref 65–99)
POTASSIUM: 3.8 mmol/L (ref 3.5–5.1)
Sodium: 136 mmol/L (ref 135–145)
TOTAL PROTEIN: 7.2 g/dL (ref 6.5–8.1)

## 2016-09-12 LAB — I-STAT CHEM 8, ED
BUN: 15 mg/dL (ref 6–20)
CALCIUM ION: 1.1 mmol/L — AB (ref 1.15–1.40)
Chloride: 96 mmol/L — ABNORMAL LOW (ref 101–111)
Creatinine, Ser: 1.3 mg/dL — ABNORMAL HIGH (ref 0.61–1.24)
GLUCOSE: 73 mg/dL (ref 65–99)
HCT: 45 % (ref 39.0–52.0)
HEMOGLOBIN: 15.3 g/dL (ref 13.0–17.0)
Potassium: 3.9 mmol/L (ref 3.5–5.1)
Sodium: 139 mmol/L (ref 135–145)
TCO2: 30 mmol/L (ref 0–100)

## 2016-09-12 LAB — RAPID URINE DRUG SCREEN, HOSP PERFORMED
Amphetamines: NOT DETECTED
BARBITURATES: NOT DETECTED
BENZODIAZEPINES: NOT DETECTED
Cocaine: NOT DETECTED
Opiates: NOT DETECTED
Tetrahydrocannabinol: NOT DETECTED

## 2016-09-12 LAB — DIFFERENTIAL
BASOS PCT: 0 %
Basophils Absolute: 0 10*3/uL (ref 0.0–0.1)
EOS ABS: 0.1 10*3/uL (ref 0.0–0.7)
Eosinophils Relative: 1 %
LYMPHS ABS: 2.3 10*3/uL (ref 0.7–4.0)
Lymphocytes Relative: 35 %
MONO ABS: 0.5 10*3/uL (ref 0.1–1.0)
MONOS PCT: 8 %
Neutro Abs: 3.7 10*3/uL (ref 1.7–7.7)
Neutrophils Relative %: 56 %

## 2016-09-12 LAB — PROTIME-INR
INR: 0.92
Prothrombin Time: 12.3 seconds (ref 11.4–15.2)

## 2016-09-12 LAB — URINALYSIS, ROUTINE W REFLEX MICROSCOPIC
Bilirubin Urine: NEGATIVE
Glucose, UA: 50 mg/dL — AB
HGB URINE DIPSTICK: NEGATIVE
Ketones, ur: NEGATIVE mg/dL
LEUKOCYTES UA: NEGATIVE
Nitrite: NEGATIVE
PROTEIN: NEGATIVE mg/dL
SPECIFIC GRAVITY, URINE: 1.005 (ref 1.005–1.030)
pH: 7 (ref 5.0–8.0)

## 2016-09-12 LAB — CBC
HEMATOCRIT: 42.3 % (ref 39.0–52.0)
HEMOGLOBIN: 14.8 g/dL (ref 13.0–17.0)
MCH: 30.7 pg (ref 26.0–34.0)
MCHC: 35 g/dL (ref 30.0–36.0)
MCV: 87.8 fL (ref 78.0–100.0)
Platelets: 117 10*3/uL — ABNORMAL LOW (ref 150–400)
RBC: 4.82 MIL/uL (ref 4.22–5.81)
RDW: 13.6 % (ref 11.5–15.5)
WBC: 6.5 10*3/uL (ref 4.0–10.5)

## 2016-09-12 LAB — CBG MONITORING, ED
GLUCOSE-CAPILLARY: 61 mg/dL — AB (ref 65–99)
GLUCOSE-CAPILLARY: 108 mg/dL — AB (ref 65–99)
GLUCOSE-CAPILLARY: 98 mg/dL (ref 65–99)
GLUCOSE-CAPILLARY: 85 mg/dL (ref 65–99)

## 2016-09-12 LAB — ETHANOL

## 2016-09-12 LAB — GLUCOSE, CAPILLARY: Glucose-Capillary: 87 mg/dL (ref 65–99)

## 2016-09-12 LAB — I-STAT TROPONIN, ED: TROPONIN I, POC: 0 ng/mL (ref 0.00–0.08)

## 2016-09-12 LAB — APTT: aPTT: 28 seconds (ref 24–36)

## 2016-09-12 LAB — TROPONIN I: Troponin I: <0.03 ng/mL (ref <0.03)

## 2016-09-12 LAB — TSH: TSH: 2.679 u[IU]/mL (ref 0.350–4.500)

## 2016-09-12 MED ORDER — LEVETIRACETAM 500 MG PO TABS
500.0000 mg | ORAL_TABLET | Freq: Two times a day (BID) | ORAL | Status: DC
  Administered 2016-09-12 – 2016-09-14 (×4): 500 mg via ORAL
  Filled 2016-09-12 (×4): qty 1

## 2016-09-12 MED ORDER — MOMETASONE FURO-FORMOTEROL FUM 100-5 MCG/ACT IN AERO
2.0000 | INHALATION_SPRAY | Freq: Two times a day (BID) | RESPIRATORY_TRACT | Status: DC
  Administered 2016-09-13 – 2016-09-14 (×3): 2 via RESPIRATORY_TRACT
  Filled 2016-09-12: qty 8.8

## 2016-09-12 MED ORDER — INSULIN ASPART 100 UNIT/ML ~~LOC~~ SOLN
0.0000 [IU] | Freq: Three times a day (TID) | SUBCUTANEOUS | Status: DC
  Administered 2016-09-13 – 2016-09-14 (×2): 2 [IU] via SUBCUTANEOUS

## 2016-09-12 MED ORDER — ACETAMINOPHEN 650 MG RE SUPP: 650.0000 mg | RECTAL | Status: DC | PRN

## 2016-09-12 MED ORDER — SODIUM CHLORIDE 0.9 % IV SOLN: INTRAVENOUS | Status: DC

## 2016-09-12 MED ORDER — DULOXETINE HCL 60 MG PO CPEP: 60.0000 mg | ORAL_CAPSULE | Freq: Every day | ORAL | Status: DC

## 2016-09-12 MED ORDER — STROKE: EARLY STAGES OF RECOVERY BOOK
Freq: Once | Status: AC
  Administered 2016-09-14: 06:00:00
  Filled 2016-09-12: qty 1

## 2016-09-12 MED ORDER — LORAZEPAM 2 MG/ML IJ SOLN: 1.0000 mg | INTRAMUSCULAR | Status: DC | PRN

## 2016-09-12 MED ORDER — ACETAMINOPHEN 160 MG/5ML PO SOLN: 650.0000 mg | ORAL | Status: DC | PRN

## 2016-09-12 MED ORDER — SENNOSIDES-DOCUSATE SODIUM 8.6-50 MG PO TABS: 1.0000 | ORAL_TABLET | Freq: Every evening | ORAL | Status: DC | PRN

## 2016-09-12 MED ORDER — CLOPIDOGREL BISULFATE 75 MG PO TABS
75.0000 mg | ORAL_TABLET | Freq: Every day | ORAL | Status: DC
  Administered 2016-09-12 – 2016-09-14 (×3): 75 mg via ORAL
  Filled 2016-09-12 (×3): qty 1

## 2016-09-12 MED ORDER — PREDNISONE 1 MG PO TABS
1.0000 mg | ORAL_TABLET | Freq: Every day | ORAL | Status: DC
  Administered 2016-09-13 – 2016-09-14 (×2): 1 mg via ORAL
  Filled 2016-09-12 (×4): qty 1

## 2016-09-12 MED ORDER — ENOXAPARIN SODIUM 40 MG/0.4ML ~~LOC~~ SOLN
40.0000 mg | SUBCUTANEOUS | Status: DC
  Administered 2016-09-12 – 2016-09-13 (×2): 40 mg via SUBCUTANEOUS
  Filled 2016-09-12 (×2): qty 0.4

## 2016-09-12 MED ORDER — ALBUTEROL SULFATE (2.5 MG/3ML) 0.083% IN NEBU: 3.0000 mL | INHALATION_SOLUTION | Freq: Four times a day (QID) | RESPIRATORY_TRACT | Status: DC | PRN

## 2016-09-12 MED ORDER — TIZANIDINE HCL 4 MG PO TABS
2.0000 mg | ORAL_TABLET | Freq: Three times a day (TID) | ORAL | Status: DC
  Administered 2016-09-12 – 2016-09-14 (×6): 2 mg via ORAL
  Filled 2016-09-12 (×6): qty 1

## 2016-09-12 MED ORDER — SODIUM CHLORIDE 0.9 % IV SOLN
75.0000 mL/h | INTRAVENOUS | Status: DC
  Administered 2016-09-12: 75 mL/h via INTRAVENOUS

## 2016-09-12 MED ORDER — ASPIRIN EC 325 MG PO TBEC
325.0000 mg | DELAYED_RELEASE_TABLET | Freq: Every day | ORAL | Status: DC
  Administered 2016-09-12 – 2016-09-14 (×3): 325 mg via ORAL
  Filled 2016-09-12 (×3): qty 1

## 2016-09-12 MED ORDER — ATORVASTATIN CALCIUM 10 MG PO TABS
10.0000 mg | ORAL_TABLET | Freq: Every day | ORAL | Status: DC
  Administered 2016-09-12 – 2016-09-13 (×2): 10 mg via ORAL
  Filled 2016-09-12 (×2): qty 1

## 2016-09-12 MED ORDER — TIOTROPIUM BROMIDE MONOHYDRATE 18 MCG IN CAPS
18.0000 ug | ORAL_CAPSULE | Freq: Every day | RESPIRATORY_TRACT | Status: DC
  Administered 2016-09-13 – 2016-09-14 (×2): 18 ug via RESPIRATORY_TRACT
  Filled 2016-09-12: qty 5

## 2016-09-12 MED ORDER — CITALOPRAM HYDROBROMIDE 20 MG PO TABS
10.0000 mg | ORAL_TABLET | Freq: Every day | ORAL | Status: DC
  Administered 2016-09-12 – 2016-09-14 (×3): 10 mg via ORAL
  Filled 2016-09-12 (×3): qty 1

## 2016-09-12 MED ORDER — PANTOPRAZOLE SODIUM 40 MG PO TBEC
80.0000 mg | DELAYED_RELEASE_TABLET | Freq: Every day | ORAL | Status: DC
  Administered 2016-09-12 – 2016-09-14 (×3): 80 mg via ORAL
  Filled 2016-09-12 (×3): qty 2

## 2016-09-12 MED ORDER — TRAMADOL HCL 50 MG PO TABS
50.0000 mg | ORAL_TABLET | Freq: Three times a day (TID) | ORAL | Status: DC | PRN
  Administered 2016-09-13: 50 mg via ORAL
  Filled 2016-09-12: qty 1

## 2016-09-12 MED ORDER — ACETAMINOPHEN 325 MG PO TABS
650.0000 mg | ORAL_TABLET | ORAL | Status: DC | PRN
  Administered 2016-09-14: 650 mg via ORAL
  Filled 2016-09-12: qty 2

## 2016-09-12 MED ORDER — MELATONIN 5 MG PO TABS: 5.0000 mg | ORAL_TABLET | Freq: Every day | ORAL | Status: DC

## 2016-09-12 MED ORDER — GABAPENTIN 300 MG PO CAPS
300.0000 mg | ORAL_CAPSULE | Freq: Three times a day (TID) | ORAL | Status: DC
  Administered 2016-09-12 – 2016-09-14 (×6): 300 mg via ORAL
  Filled 2016-09-12 (×6): qty 1

## 2016-09-12 MED ORDER — OXYBUTYNIN CHLORIDE ER 5 MG PO TB24
15.0000 mg | ORAL_TABLET | Freq: Every day | ORAL | Status: DC
  Administered 2016-09-12 – 2016-09-13 (×2): 15 mg via ORAL
  Filled 2016-09-12 (×2): qty 3

## 2016-09-12 MED ORDER — DEXTROSE 50 % IV SOLN
INTRAVENOUS | Status: AC
  Administered 2016-09-12: 25 mL
  Filled 2016-09-12: qty 50

## 2016-09-12 NOTE — ED Notes (Signed)
Per pt's mother pt manages own medication.

## 2016-09-12 NOTE — ED Triage Notes (Signed)
Per EMS pt LKW 1400. Hypertensive, not responding verbally to EMS, and L sided weakness.

## 2016-09-12 NOTE — ED Provider Notes (Signed)
Emergency Department Provider Note   I have reviewed the triage vital signs and the nursing notes.   HISTORY  Chief Complaint Code Stroke   HPI Charles [name] is a 61 y.o. male with PMH of HTN, GERD, Deafness, HLD, prior CVA with residual left sided weakness presents to the emergency department by EMS as a code stroke. Last known normal 2 PM. Patient unable to provide any history. EMS report that family heard a fall in the other room and came the find the patient complaining of vision loss. Unclear if this was partial loss, monocular, or complete vision loss. EMS notes a history of prior CVA with baseline left sided weakness.   Level 5 caveat: Code Stroke with deafness   Past Medical History:  Diagnosis Date  . Anemia   . Asthma   . Deaf   . Essential hypertension, benign   . GERD (gastroesophageal reflux disease)   . Headache    migraines in the past per pt  . Hearing impaired   . History of stroke    Spastic hemiplegia, right MCA stroke April 2014 in [place]  . Mixed hyperlipidemia   . Other pancytopenia ([place]) 12/23/2015  . PAD (peripheral artery disease) ([place])   . Sarcoidosis   . Seizures ([place])   . Stroke [facility])    2014, left sided weakness  . Type 2 diabetes mellitus [facility])     Patient Active Problem List   Diagnosis Date Noted  . MDD (major depressive disorder), recurrent episode, severe ([place]) 12/30/2015  . Suicidal ideation   . Other pancytopenia ([place]) 12/23/2015  . Loss of weight 11/12/2015  . 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 Iron deficiency anemia 09/22/2015  . Spinal stenosis in cervical region 08/21/2015  . Neck pain 08/12/2015  . Low back pain 08/12/2015  . Abnormality of gait 08/12/2015  . Neuropathy of right upper extremity 06/29/2015  . Acute rhinitis 05/05/2015  . Abdominal pain, epigastric 04/23/2015  . Arm pain, right 04/23/2015  . Atypical chest pain 07/23/2013  . 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 Dyspepsia  06/13/2013  . Dysphagia 06/13/2013  . Headache 11/30/2012  . Solitary pulmonary nodule 11/20/2012  . Spastic hemiplegia affecting left nondominant side ([place]) 11/08/2012  . Essential hypertension, benign 10/12/2012  . COPD GOLD II with reversibility  10/12/2012  . 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 CVA (cerebral vascular accident) ([place]) 10/03/2012  . Diabetes ([place]) 10/03/2012  . Sarcoidosis ([place]) 10/03/2012    Past Surgical History:  Procedure Laterality Date  . BRAVO PH STUDY N/A 06/18/2013   pH STUDY SHOWS NEXIUM TWICE DAILY CONTROLS THE ACID IN HIS STOMACH. HE HAD VERY FEWEPISODE OF REGURGITATION RECORDED IN THE 2 DAYS THE STUDY WAS PERFORMED.   [name] COLONOSCOPY  2011   [place]  . 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   . ESOPHAGOGASTRODUODENOSCOPY N/A 05/05/2015   Procedure: ESOPHAGOGASTRODUODENOSCOPY (EGD);  Surgeon: Danie Binder, MD;  [facility];  Service: Endoscopy;  Laterality: N/A;  730  . MALONEY DILATION N/A 06/18/2013   Procedure: MALONEY DILATION;  Surgeon: Danie Binder, MD;  [facility];  Service: Endoscopy;  Laterality: N/A;  . OTHER SURGICAL HISTORY     cyst removal head and chest  .  POSTERIOR CERVICAL FUSION/FORAMINOTOMY N/A 10/07/2015   Procedure: Cervical Three-Four, Cervical Four-Five, Cervical Five-Six, Cervical Six-Seven Posterior Cervical Laminectomy and Fusion;  Surgeon: Kary Kos, MD;  Location: [place] [facility]UR[facility] ORS;  Service: Neurosurgery;  Laterality: N/A;  . SAVORY DILATION N/A 06/18/2013   Procedure: SAVORY DILATION;  Surgeon: Danie Binder, MD;  [facility];  Service: Endoscopy;  Laterality: N/A;  . TEE WITHOUT CARDIOVERSION N/A  10/04/2012   Procedure: TRANSESOPHAGEAL ECHOCARDIOGRAM (TEE);  Surgeon: Thayer Headings, MD;  Location: [place];  Service: Cardiovascular;  Laterality: N/A;  . [place] accident  Both legs fractured and repaired surgically    Current Outpatient Rx  . Order #: [id number] Class: Historical Med  . Order #: [id number] Class: Historical Med  . Order #: [id number] Class: Historical Med  . Order #: [id number] Class: Historical Med  . Order #: [id number] Class: Historical Med  . Order #: [id number] Class: Normal  . Order #: [id number] Class: Historical Med  . Order #: [id number] Class: Print  . Order #: [id number] Class: Normal  . Order #: [id number] Class: Normal  . Order #: [id number] Class: Historical Med  . Order #: [id number] Class: Print  . Order #: [id number] Class: Historical Med  . Order #: [id number] Class: Historical Med  . Order #: [id number] Class: Normal  . Order #: [id number] Class: Historical Med  . Order #: [id number] Class: Historical Med  . Order #: [id number] Class: Historical Med  . Order #: [id number] Class: Historical Med  . Order #: [id number] Class: Historical Med  . Order #: [id number] Class: Historical Med  . Order #: [id number] Class: Historical Med  . Order #: [id number] Class: Normal  . Order #: [id number] Class: Print    Allergies Shellfish allergy; Hydrocodone; Contrast media [iodinated diagnostic agents]; and Oxycodone  Family History  Problem Relation Age of Onset  . Diabetes Mother   . Hypertension Mother   . Diabetes Father   . Hypertension Father   . Colon polyps Neg Hx   . Colon cancer Neg Hx     Social History Social History  Substance Use Topics  . Smoking status: Former Smoker    Packs/day: 1.00    Years: 29.00    Types: Cigarettes    Quit date: 02/22/1992  . Smokeless tobacco: Never Used  . Alcohol use 0.0 oz/week     Comment: occasionally    Review of Systems  Level 5 caveat: Patient with minimal verbal effort.    ____________________________________________   PHYSICAL EXAM:  VITAL SIGNS: Vitals:   09/12/16 1500 09/12/16 1600  BP: (!) 159/106 (!) 158/90  Pulse: 72 67  Resp: 17 10  Temp: 98.1 F (36.7 C)     Constitutional: Alert with baseline deafness. Occasional garbled speech.  Eyes: Conjunctivae are normal. PERRL.  Head: Atraumatic. Nose: No congestion/rhinnorhea. Mouth/Throat: Mucous membranes are moist.   Neck: No stridor.  No meningeal signs.   Cardiovascular: Normal rate, regular rhythm. Good peripheral circulation. Grossly normal heart sounds.    Respiratory: Normal respiratory effort.  No retractions. Lungs CTAB. Gastrointestinal: Soft and nontender. No distention.  Musculoskeletal: No gross deformities of extremities. Neurologic:  Patient with baseline deafness. Garbled speech occasionally but minimal verbal effort. Moderately contracted LUE. 3/5 strength in the LLE. Positive left face droop.  Skin:  Skin is warm, dry and intact. No rash noted.  ____________________________________________   LABS (all labs ordered are listed, but only abnormal results are displayed)  Labs Reviewed  CBC - Abnormal; Notable for the following:       Result Value   Platelets 117 (*)  All other components within normal limits  COMPREHENSIVE METABOLIC PANEL - Abnormal; Notable for the following:    Chloride 97 (*)    Creatinine, Ser 1.32 (*)    Calcium 8.8 (*)    GFR calc non Af Amer 56 (*)    All other components within normal limits  URINALYSIS, ROUTINE W REFLEX MICROSCOPIC - Abnormal; Notable for the following:    Color, Urine STRAW (*)    Glucose, UA 50 (*)    All other components within normal limits  I-STAT CHEM 8, ED - Abnormal; Notable for the following:    Chloride 96 (*)    Creatinine, Ser 1.30 (*)    Calcium, Ion 1.10 (*)    All other components within normal limits  CBG MONITORING, ED - Abnormal; Notable for the following:    Glucose-Capillary 61 (*)    All other  components within normal limits  CBG MONITORING, ED - Abnormal; Notable for the following:    Glucose-Capillary 108 (*)    All other components within normal limits  ETHANOL  PROTIME-INR  APTT  DIFFERENTIAL  RAPID URINE DRUG SCREEN, HOSP PERFORMED  I-STAT TROPONIN, ED  CBG MONITORING, ED   ____________________________________________  EKG   EKG Interpretation  Date/Time:  Monday September 12 2016 15:03:39 EDT Ventricular Rate:  75 PR Interval:    QRS Duration: 100 QT Interval:  423 QTC Calculation: 473 R Axis:   59 Text Interpretation:  Sinus rhythm Probable left ventricular hypertrophy Anterior ST elevation, probably due to LVH No STEMI.  Confirmed by Nanda Quinton [phone]) on 09/12/2016 3:06:16 PM       ____________________________________________  RADIOLOGY  Ct Head Code Stroke W/o Cm  Result Date: 09/12/2016 CLINICAL DATA:  Code stroke. Acute onset right-sided weakness. Code stroke. EXAM: CT HEAD WITHOUT CONTRAST TECHNIQUE: Contiguous axial images were obtained from the base of the skull through the vertex without intravenous contrast. COMPARISON:  CT head without contrast 04/15/2015 FINDINGS: Brain: Chronic right MCA territory encephalomalacia is stable. Advanced periventricular and subcortical white matter hypoattenuation is present bilaterally without significant change. Remote right basal ganglia infarcts are again noted. The left basal ganglia are are intact. The insular ribbon is intact on the left. No focal cortical lesions are present. Vascular: Atherosclerotic calcifications are present within the cavernous internal carotid artery's bilaterally. Skull: The calvarium is intact. No focal lytic or blastic lesions are present. Sinuses/Orbits: The paranasal sinuses and mastoid air cells are clear. Globes and orbits are within normal limits. ASPECTS [facility] Stroke Program Early CT Score) - Ganglionic level infarction (caudate, lentiform nuclei, internal capsule, insula, M1-M3  cortex): 7/7 - Supraganglionic infarction (M4-M6 cortex): 3/3 Total score (0-10 with 10 being normal): 10/10 IMPRESSION: 1. No acute or subacute left-sided infarct. 2. Chronic right MCA encephalomalacia is stable. 3. Stable chronic atrophy and white matter disease bilaterally. 4. ASPECTS is 10/10 These results were called by telephone at the time of interpretation on 09/12/2016 at 3:02 pm to Dr. Roderic Palau , who verbally acknowledged these results. Electronically Signed   By: San Morelle M.D.   On: 09/12/2016 15:04    ____________________________________________   PROCEDURES  Procedure(s) performed:   Procedures  CRITICAL CARE Performed by: Margette Fast Total critical care time: 40 minutes Critical care time was exclusive of separately billable procedures and treating other patients. Critical care was necessary to treat or prevent imminent or life-threatening deterioration. Critical care was time spent personally by me on the following activities: development of treatment plan with patient and/or  surrogate as well as nursing, discussions with consultants, evaluation of patient's response to treatment, examination of patient, obtaining history from patient or surrogate, ordering and performing treatments and interventions, ordering and review of laboratory studies, ordering and review of radiographic studies, pulse oximetry and re-evaluation of patient's condition.  Nanda Quinton, MD Emergency Medicine  ____________________________________________   INITIAL IMPRESSION / ASSESSMENT AND PLAN / ED COURSE  Pertinent labs & imaging results that were available during my care of the patient were reviewed by me and considered in my medical decision making (see chart for details).  Patient arrives to the emergency department as a code stroke. Last known normal 2 PM. Patient is currently not speaking. He has a known history of deafness. Patient does seem to have a left gaze preference but will  turn had come across midline, make eye contact with me during the exam. In moving my hand rapidly toward the patient's face he appropriately closes his eyes. Plan for code stroke CT and reassess after neurology evaluation. No family immediately available for further history.   03:25 PM Spoke with tele-neurology Dr. Barb Merino after evaluation. No indication for tPA. Neurology suspects that this was a seizure from a hypoglycemic event. Has been compliant with medications. Advises admission for repeat CVA w/u, monitoring of blood glucose, and evaluation for breakthrough seizure.  04:41 PM Discussed patient's case with Hospitalist, Dr. Lorin Mercy. Patient and family (if present) updated with plan. Care transferred to Hospitalist service.  I reviewed all nursing notes, vitals, pertinent old records, EKGs, labs, imaging (as available).   ____________________________________________  FINAL CLINICAL IMPRESSION(S) / ED DIAGNOSES  Final diagnoses:  Aphasia  Hypoglycemia     MEDICATIONS GIVEN DURING THIS VISIT:  Medications  dextrose 50 % solution (25 mLs  Given 09/12/16 1500)     NEW OUTPATIENT MEDICATIONS STARTED DURING THIS VISIT:  None   Note:  This document was prepared using Dragon voice recognition software and may include unintentional dictation errors.  Nanda Quinton, MD Emergency Medicine    Long, [facility], MD 09/12/16 [phone]

## 2016-09-12 NOTE — ED Notes (Signed)
Report called to [facility] RN

## 2016-09-12 NOTE — ED Notes (Signed)
Per family pt is hearing impaired and uses ASL. Pt would follow verbal and visual commands with this RN before this was discovered. ASL interpreter, Mel #[id number], used during [facility], pt began signing during assessment. Has L sided residual weakness from previous stroke. Per mother, who is at bedside, pt stated "what time is my nurse coming?" at 0930 today, states she also spoke with pt at 1100 or 1200 and pt was normal. Pt can sign name, location, and states HA and L leg pain.

## 2016-09-12 NOTE — H&P (Signed)
History and Physical    [name]:[id number] DOB: April 29, 1955 DOA: 09/12/2016  PCP: [name], MD Consultants:  Krista Blue - neurology; Saintclair Halsted - neurosurgery; Marlene Lard - pulmonology Patient coming from: Home - lives with mother; [facility]: mother, [phone]  Chief Complaint: LOC  HPI: Charles [name] is a 61 y.o. 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  His mother was home and she heard him fall.  She found him laying in the floor and she reports he was out for <5 minutes.  No bowel or bladder incontinence.  He reports (through the sign language tele-interpreter) "I was just walking and then I sat down and felt dizzy and fell over and blacked out".  He had blurry vision.  He just waited until it got better.  He has a h/o seizures.  The patient feels this is different from his seizures.   He feels okay now.  He has a headache and it feels funny on the side of his head.  It hurts on both sides and all around the crown of his head.  He was feeling dizzy over the last few days.   He has a h/o stroke, has left-sided deficits and these are currently unchanged from prior to today's events.   "When I walk I feel a shooting pain in my butt.  The medication that I have is not helping with that" - Naproxen.  When he stands up and starts walking he notices it.  He sits down and it goes away kind of but the medicine is not helping.  This has only been going on for 2-3 days but is persistent and bothersome to the patient. He sees Dr. Saintclair Halsted for back issues.  He has a h/o surgery on his neck, but has not had back surgery - he has had a number of injections in his back.  ED Course:  Code stroke.  Teleneurology - no indication for tPA.  Possible seizure from hypoglycemic event.  Patient reports compliance with medications.  Admit for repeat CVA eval,  glucose monitoring, and seizure awareness.  Review of Systems: As per HPI; otherwise review of systems reviewed and negative.   Ambulatory Status:  Ambulates with a cane  Past Medical History:  Diagnosis Date  . Anemia   . Asthma   . Deaf   . Essential hypertension, benign   . GERD (gastroesophageal reflux disease)   . Headache    migraines in the past per pt  . History of stroke    Spastic hemiplegia, right MCA stroke April 2014 in [place]  . Mixed hyperlipidemia   . Other pancytopenia ([place]) 12/23/2015  . PAD (peripheral artery disease) ([place])   . Sarcoidosis    lungs  . Seizures ([place])   . Stroke [facility])    2014, left sided weakness  . Type 2 diabetes mellitus ([place])     Past Surgical History:  Procedure Laterality Date  . BRAVO PH STUDY N/A 06/18/2013   pH STUDY SHOWS NEXIUM TWICE DAILY CONTROLS THE ACID IN HIS STOMACH. HE HAD VERY FEWEPISODE OF REGURGITATION RECORDED IN THE 2 DAYS THE STUDY WAS PERFORMED.   [name] COLONOSCOPY  2011   [place]  . 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   . ESOPHAGOGASTRODUODENOSCOPY N/A 05/05/2015   Procedure: ESOPHAGOGASTRODUODENOSCOPY (EGD);  Surgeon: Danie Binder, MD;  [facility];  Service: Endoscopy;  Laterality: N/A;  730  . MALONEY DILATION N/A 06/18/2013   Procedure: MALONEY DILATION;  Surgeon: Danie Binder, MD;  Location[facility];  Service: Endoscopy;  Laterality: N/A;  . OTHER SURGICAL HISTORY     cyst removal head and chest  . POSTERIOR CERVICAL FUSION/FORAMINOTOMY N/A 10/07/2015   Procedure: Cervical Three-Four, Cervical Four-Five, Cervical Five-Six, Cervical Six-Seven Posterior Cervical Laminectomy and Fusion;   Surgeon: Kary Kos, MD;  Location: [place] NEURO ORS;  Service: Neurosurgery;  Laterality: N/A;  . SAVORY DILATION N/A 06/18/2013   Procedure: SAVORY DILATION;  Surgeon: Danie Binder, MD;  [facility];  Service: Endoscopy;  Laterality: N/A;  . TEE WITHOUT CARDIOVERSION N/A 10/04/2012   Procedure: TRANSESOPHAGEAL ECHOCARDIOGRAM (TEE);  Surgeon: Thayer Headings, MD;  Location: [place];  Service: Cardiovascular;  Laterality: N/A;  . Slope accident  Both legs fractured and repaired surgically    Social History   Social History  . Marital status: Single    Spouse name: N/A  . Number of children: 0  . Years of education: HS   Occupational History  . Disabled    Social History Main Topics  . Smoking status: Former Smoker    Packs/day: 1.00    Years: 29.00    Types: Cigarettes    Quit date: 02/22/1992  . Smokeless tobacco: Never Used  . Alcohol use 0.6 - 1.2 oz/week    1 - 2 Cans of beer per week     Comment: occasionally, every 2-3 days  . Drug use: Yes    Types: Marijuana, "Crack" cocaine     Comment: hx of crack cocaine "long time ago" per pt - hard to tell how long it is has been since he used  . Sexual activity: No   Other Topics Concern  . Not on file   Social History Narrative   Patient lives at home with his family. Brother, sister, mother    Patient does not work.    Patient has a high school education.    Patient has one step child     Allergies  Allergen Reactions  . Shellfish Allergy Hives and Swelling    SWELLING REACTION UNSPECIFIED   . Hydrocodone Other (See Comments)    Makes "looney"  . Contrast Media [Iodinated Diagnostic Agents] Itching  . Oxycodone Nausea And Vomiting    Family History  Problem Relation Age of Onset  . Diabetes Mother   . Hypertension Mother   . Diabetes Father   . Hypertension Father   . Colon polyps Neg Hx   . Colon cancer Neg Hx     Prior to Admission medications   Medication  Sig Start Date End Date Taking? Authorizing Provider  albuterol (PROAIR HFA) 108 (90 Base) MCG/ACT inhaler Inhale 2 puffs into the lungs every 6 (six) hours as needed for wheezing or shortness of breath.    [provider]  aspirin 325 MG EC tablet Take 325 mg by mouth daily.    [provider]  atorvastatin (LIPITOR) 10 MG tablet Take 10 mg by mouth daily. (cholesterol)    [provider]  cholecalciferol (VITAMIN D) 1000 units tablet Take 1,000 Units by  mouth daily.    [provider]  clopidogrel (PLAVIX) 75 MG tablet Take 75 mg by mouth daily.    [provider]  DULoxetine (CYMBALTA) 60 MG capsule Take 1 capsule (60 mg total) by mouth daily. 03/08/16   Marcial Pacas, MD  escitalopram (LEXAPRO) 10 MG tablet Take 10 mg by mouth daily.    [provider]  esomeprazole (NEXIUM) 40 MG capsule Take 1 capsule (40 mg total) by mouth 2 (two) times daily. 04/17/15   Carmin Muskrat, MD  gabapentin (NEURONTIN) 300 MG capsule Take 1 capsule (300 mg total) by mouth 3 (three) times daily. 12/07/15   Marcial Pacas, MD  glucose blood (ONETOUCH VERIO) test strip 1 each by Other route daily. And lancets 1/day 06/03/16   Renato Shin, MD  hydrochlorothiazide (MICROZIDE) 12.5 MG capsule Take 12.5 mg by mouth daily.    [provider]  levETIRAcetam (KEPPRA) 500 MG tablet Take 1 tablet (500 mg total) by mouth 2 (two) times daily. 05/23/16   Rolland Porter, MD  losartan (COZAAR) 50 MG tablet Take 50 mg by mouth daily.    [provider]  meloxicam (MOBIC) 7.5 MG tablet  10/01/15   [provider]  mometasone-formoterol (DULERA) 100-5 MCG/ACT AERO Inhale 2 puffs into the lungs every 12 (twelve) hours. 02/09/16   Tanda Rockers, MD  naproxen (NAPROSYN) 500 MG tablet Take 500 mg by mouth 2 (two) times daily with a meal. 09/07/15   [provider]  olmesartan (BENICAR) 20 MG tablet Take 20 mg by mouth daily.    [provider]    oxybutynin (DITROPAN XL) 15 MG 24 hr tablet Take 15 mg by mouth at bedtime.    [provider]  predniSONE (DELTASONE) 1 MG tablet Take 1 mg by mouth daily with breakfast.    [provider]  sitaGLIPtin (JANUVIA) 100 MG tablet Take 100 mg by mouth daily.    [provider]  sitaGLIPtin-metformin (JANUMET) 50-500 MG tablet Take 1 tablet by mouth 2 (two) times daily with a meal.    [provider]  Tiotropium Bromide Monohydrate (SPIRIVA HANDIHALER IN) Inhale into the lungs.    [provider]  tiZANidine (ZANAFLEX) 2 MG tablet Take 1 tablet (2 mg total) by mouth 3 (three) times daily. 03/08/16   Marcial Pacas, MD  traMADol (ULTRAM) 50 MG tablet Take 1 tablet (50 mg total) by mouth every 8 (eight) hours as needed. Patient taking differently: Take 50 mg by mouth every 8 (eight) hours as needed (pain).  08/12/15   Marcial Pacas, MD    Physical Exam: Vitals:   09/12/16 1800 09/12/16 1905 09/12/16 2052 09/12/16 2100  BP: (!) 169/94 (!) 181/88  (!) 174/98  Pulse: 71 63  65  Resp: (!) 21 18  18   Temp:   97.8 F (36.6 C) 97.8 F (36.6 C)  TempSrc:    Oral  SpO2: 99% 100%  100%  Weight:    75.8 kg (167 lb 3.2 oz)  Height:    5\' 4"  (1.626 m)     General:  Appears calm and comfortable and is NAD Eyes:  PERRL, EOMI, normal lids, iris ENT: Deaf, speaks some, communicates through ASL, lips & tongue, mmm Neck:  no LAD, masses or thyromegaly Cardiovascular:  RRR, no m/r/g. No LE edema.  Respiratory:  CTA bilaterally, no w/r/r. Normal respiratory effort. Abdomen:  soft, ntnd, NABS Skin:  no rash or induration seen on limited exam Musculoskeletal: left hemiparesis.  He  is able to move the leg with limited flexion.  The arm is flaccid.  This is apparent chronic and unchanged from his norm. Psychiatric: Depressed mood and affect, speech fluent and appropriate with the ASL interpreter, AOx3. Neurologic: CN 2-12 grossly intact other than left V3 motor disturbance,  moves right extremities in coordinated fashion, sensation intact  Labs on Admission: I have personally reviewed following labs and imaging studies  CBC:  Recent Labs Lab 09/12/16 1428 09/12/16 1452  WBC 6.5  --   NEUTROABS 3.7  --   HGB 14.8 15.3  HCT 42.3 45.0  MCV 87.8  --   PLT 117*  --    Basic Metabolic Panel:  Recent Labs Lab 09/12/16 1428 09/12/16 1452  NA 136 139  K 3.8 3.9  CL 97* 96*  CO2 31  --   GLUCOSE 74 73  BUN 15 15  CREATININE 1.32* 1.30*  CALCIUM 8.8*  --    GFR: Estimated Creatinine Clearance: 54.8 mL/min (A) (by C-G formula based on SCr of 1.3 mg/dL (H)). Liver Function Tests:  Recent Labs Lab 09/12/16 1428  AST 27  ALT 25  ALKPHOS 60  BILITOT 0.6  PROT 7.2  ALBUMIN 4.2   No results for input(s): LIPASE, AMYLASE in the last 168 hours. No results for input(s): AMMONIA in the last 168 hours. Coagulation Profile:  Recent Labs Lab 09/12/16 1428  INR 0.92   Cardiac Enzymes:  Recent Labs Lab 09/12/16 2024  TROPONINI <0.03   BNP (last 3 results) No results for input(s): PROBNP in the last 8760 hours. HbA1C: No results for input(s): HGBA1C in the last 72 hours. CBG:  Recent Labs Lab 09/12/16 1458 09/12/16 1535 09/12/16 1608 09/12/16 1747 09/12/16 2105  GLUCAP 61* 108* 98 85 87   Lipid Profile: No results for input(s): CHOL, HDL, LDLCALC, TRIG, CHOLHDL, LDLDIRECT in the last 72 hours. Thyroid Function Tests: No results for input(s): TSH, T4TOTAL, FREET4, T3FREE, THYROIDAB in the last 72 hours. Anemia Panel: No results for input(s): VITAMINB12, FOLATE, FERRITIN, TIBC, IRON, RETICCTPCT in the last 72 hours. Urine analysis:    Component Value Date/Time   COLORURINE STRAW (A) 09/12/2016 1450   APPEARANCEUR CLEAR 09/12/2016 1450   LABSPEC 1.005 09/12/2016 1450   PHURINE 7.0 09/12/2016 1450   GLUCOSEU 50 (A) 09/12/2016 1450   HGBUR NEGATIVE 09/12/2016 1450   BILIRUBINUR NEGATIVE 09/12/2016 1450   KETONESUR NEGATIVE  09/12/2016 1450   PROTEINUR NEGATIVE 09/12/2016 1450   UROBILINOGEN 0.2 10/02/2012 2337   NITRITE NEGATIVE 09/12/2016 1450   LEUKOCYTESUR NEGATIVE 09/12/2016 1450    Creatinine Clearance: Estimated Creatinine Clearance: 54.8 mL/min (A) (by C-G formula based on SCr of 1.3 mg/dL (H)).  Sepsis Labs: @LABRCNTIP (procalcitonin:4,lacticidven:4) )No results found for this or any previous visit (from the past 240 hour(s)).   Radiological Exams on Admission: Dg Chest 2 View  Result Date: 09/12/2016 CLINICAL DATA:  The sided chest pain and shortness of Breath EXAM: CHEST  2 VIEW COMPARISON:  04/17/2015 FINDINGS: Cardiac shadows within normal limits. Multiple calcified hilar and mediastinal lymph nodes are seen and stable. Patchy scarring is noted in the right lung base similar to that seen on the prior exam. Some scattered calcified granulomas are seen. Postsurgical changes in the cervical spine are noted. No acute bony abnormality is seen. IMPRESSION: Changes of prior granulomatous disease.  No acute abnormality seen. Electronically Signed   By: Inez Catalina M.D.   On: 09/12/2016 21:48   Ct Head Code Stroke W/o Cm  Result Date: 09/12/2016 CLINICAL DATA:  Code stroke. Acute onset right-sided weakness. Code stroke. EXAM: CT HEAD WITHOUT CONTRAST TECHNIQUE: Contiguous axial images were obtained from the base of the skull through the vertex without intravenous contrast. COMPARISON:  CT head without contrast 04/15/2015 FINDINGS: Brain: Chronic right MCA territory encephalomalacia is stable. Advanced periventricular and subcortical white matter hypoattenuation is present bilaterally without significant change. Remote right basal ganglia infarcts are again noted. The left basal ganglia are are intact. The insular ribbon is intact on the left. No focal cortical lesions are present. Vascular: Atherosclerotic calcifications are present within the cavernous internal carotid artery's bilaterally. Skull: The calvarium  is intact. No focal lytic or blastic lesions are present. Sinuses/Orbits: The paranasal sinuses and mastoid air cells are clear. Globes and orbits are within normal limits. ASPECTS [facility] Stroke Program Early CT Score) - Ganglionic level infarction (caudate, lentiform nuclei, internal capsule, insula, M1-M3 cortex): 7/7 - Supraganglionic infarction (M4-M6 cortex): 3/3 Total score (0-10 with 10 being normal): 10/10 IMPRESSION: 1. No acute or subacute left-sided infarct. 2. Chronic right MCA encephalomalacia is stable. 3. Stable chronic atrophy and white matter disease bilaterally. 4. ASPECTS is 10/10 These results were called by telephone at the time of interpretation on 09/12/2016 at 3:02 pm to Dr. Roderic Palau , who verbally acknowledged these results. Electronically Signed   By: San Morelle M.D.   On: 09/12/2016 15:04    EKG: Independently reviewed.  NSR with rate 75; LVH with no evidence of acute ischemia  Assessment/Plan Principal Problem:   Syncope Active Problems:   CVA (cerebral vascular accident) ([place])   Diabetes ([place])   Sarcoidosis ([place])   Deaf mutism, congenital   Essential hypertension, benign   Headache   Low back pain   MDD (major depressive disorder), recurrent episode, severe ([place])   Seizure disorder as sequela of cerebrovascular accident [facility])   Pertinent labs: Troponin negative Glucose 73; 61; 108; 98; 85; 87 BUN 15/Creatinine 1.32; GFR >60; prior 9/1.08/>60 in 11/17 Platelets 117; 125 in 11/17 UDS negative UA negative other than 50 glucose  Syncope -Patient presenting as a code stroke due to possible syncope vs. Pre-syncopal event at home -DDx most likely includes:  -Concerning for TIA/CVA with h/o significant CVA in the past with residual deficits  -Will place in observation status for CVA/TIA evaluation  -Telemetry monitoring  -MRI/MRA  -Carotid dopplers  -Echo  -Risk stratification with FLP, A1c; will also check TSH and UDS  -Continue ASA and Plavix  daily  -PT/OT/ST/Nutrition Consults   -Seizure   -Patient with prior seizures  -He takes Neurontin and Keppra daily  -Mother describes seizure-like activity in the ambulance  -There is no evidence of tongue-biting and he did not experience bowel/bladder incontinence  -Neurology consult tomorrow  -He will likely need EEG  -Currently, there is no plan to load with additional anti-epileptic medication; if he seizes again, he is likely to need this  -Seizure precautions  -Ativan prn   -Hypoglycemic episode  -This could be the cause of a seizure or could have happened independently  -Patient with glucose as low as 61 in the hospital  -A1c was 5.1 in 8/17; will recheck  -He takes prednisone daily   -His only diabetes medication appears to be Januvia  -Will hold Januvia and cover with sensitive scale SSI  -It is not clear that he will need DM medication upon hospital discharge   HTN -Allow permissive HTN even though stroke seems less likely  -Treat BP only if >220/120,  and then with goal of 15% reduction -Hold ARB and HCTZ and plan to restart in 48-72 hours  HLD -Check FLP -Continue Lipitor 10 for now  Sarcoidosis -He is taking Prednisone 1 mg daily and uses Spiriva, Dulera, and Albuterol -Appears compensated at this time  Hearing impairment -Needs a sign language interpreter to share his thoughts -When combined with his left hemiparesis, it is clear that he is very frustrated with his inability to communicate and mobilize freely  Depression -See above -h/o polysubstance abuse in conjunction with depression and reported h/o SI -He is clearly frustrated and appears to be suffering with ongoing depression -Clearly some of this is situational -His mother reports that he meets with a counselor weekly -He is on Lexapro but this was substituted with Celexa here and I have increased his dose to see if this provides some relief -He is also on Cymbalta -He would likely benefit from  greater mental health resources, if possible  Back pain -Describes a sciatica-type pain -No imaging at this time -Continue Cymbalta, Neurontin, Zanaflex, and Ultram -Holding NSAIDs based on his current borderline renal function -Consider further evaluation and treatment as inpatient vs. outpatient  DVT prophylaxis: Lovenox  Code Status: Full - confirmed with patient/family Family Communication: Mother present throughout evaluation Disposition Plan:  Home once clinically improved Consults called: Neurology; CM/ST/PT/OT Admission status: It is my clinical opinion that referral for OBSERVATION is reasonable and necessary in this patient based on the above information provided. The aforementioned taken together are felt to place the patient at high risk for further clinical deterioration. However it is anticipated that the patient may be medically stable for discharge from the hospital within 24 to 48 hours.    Karmen Bongo MD [facility]  If 7PM-7AM, please contact night-coverage [email or web address] Password [email or web address]  09/12/2016, 10:40 PM

## 2016-09-12 NOTE — ED Notes (Signed)
Hospitalist at bedside with use of interpreter.

## 2016-09-12 NOTE — ED Notes (Signed)
Layken Doenges, pt's mother [phone]

## 2016-09-13 ENCOUNTER — Observation Stay (HOSPITAL_BASED_OUTPATIENT_CLINIC_OR_DEPARTMENT_OTHER): Payer: Medicare Other

## 2016-09-13 ENCOUNTER — Observation Stay (HOSPITAL_COMMUNITY): Payer: Medicare Other

## 2016-09-13 ENCOUNTER — Observation Stay (HOSPITAL_COMMUNITY)
Admit: 2016-09-13 | Discharge: 2016-09-13 | Disposition: A | Discharge status: Home / Self Care | Payer: Medicare Other | Attending: Internal Medicine | Admitting: Internal Medicine

## 2016-09-13 DIAGNOSIS — R55 Syncope and collapse: Secondary | ICD-10-CM

## 2016-09-13 DIAGNOSIS — I6503 Occlusion and stenosis of bilateral vertebral arteries: Secondary | ICD-10-CM | POA: Diagnosis not present

## 2016-09-13 DIAGNOSIS — G8114 Spastic hemiplegia affecting left nondominant side: Secondary | ICD-10-CM

## 2016-09-13 DIAGNOSIS — D696 Thrombocytopenia, unspecified: Secondary | ICD-10-CM

## 2016-09-13 DIAGNOSIS — I10 Essential (primary) hypertension: Secondary | ICD-10-CM | POA: Diagnosis not present

## 2016-09-13 DIAGNOSIS — R911 Solitary pulmonary nodule: Secondary | ICD-10-CM | POA: Diagnosis not present

## 2016-09-13 DIAGNOSIS — G459 Transient cerebral ischemic attack, unspecified: Secondary | ICD-10-CM | POA: Diagnosis not present

## 2016-09-13 DIAGNOSIS — E162 Hypoglycemia, unspecified: Secondary | ICD-10-CM

## 2016-09-13 DIAGNOSIS — H913 Deaf nonspeaking, not elsewhere classified: Secondary | ICD-10-CM | POA: Diagnosis not present

## 2016-09-13 DIAGNOSIS — R4701 Aphasia: Secondary | ICD-10-CM | POA: Diagnosis not present

## 2016-09-13 DIAGNOSIS — I6523 Occlusion and stenosis of bilateral carotid arteries: Secondary | ICD-10-CM | POA: Diagnosis not present

## 2016-09-13 DIAGNOSIS — G40909 Epilepsy, unspecified, not intractable, without status epilepticus: Secondary | ICD-10-CM | POA: Diagnosis not present

## 2016-09-13 DIAGNOSIS — I69398 Other sequelae of cerebral infarction: Secondary | ICD-10-CM | POA: Diagnosis not present

## 2016-09-13 LAB — BASIC METABOLIC PANEL
ANION GAP: 7 (ref 5–15)
BUN: 13 mg/dL (ref 6–20)
CHLORIDE: 100 mmol/L — AB (ref 101–111)
CO2: 30 mmol/L (ref 22–32)
Calcium: 8.5 mg/dL — ABNORMAL LOW (ref 8.9–10.3)
Creatinine, Ser: 1.08 mg/dL (ref 0.61–1.24)
GFR calc non Af Amer: >60 mL/min (ref >60)
Glucose, Bld: 117 mg/dL — ABNORMAL HIGH (ref 65–99)
POTASSIUM: 3.8 mmol/L (ref 3.5–5.1)
SODIUM: 137 mmol/L (ref 135–145)

## 2016-09-13 LAB — LIPID PANEL
CHOL/HDL RATIO: 2.7 ratio
Cholesterol: 160 mg/dL (ref 0–200)
HDL: 59 mg/dL (ref >40)
LDL CALC: 43 mg/dL (ref 0–99)
Triglycerides: 288 mg/dL — ABNORMAL HIGH (ref <150)
VLDL: 58 mg/dL — AB (ref 0–40)

## 2016-09-13 LAB — GLUCOSE, CAPILLARY
GLUCOSE-CAPILLARY: 128 mg/dL — AB (ref 65–99)
Glucose-Capillary: 197 mg/dL — ABNORMAL HIGH (ref 65–99)
Glucose-Capillary: 89 mg/dL (ref 65–99)

## 2016-09-13 LAB — TROPONIN I: Troponin I: <0.03 ng/mL (ref <0.03)

## 2016-09-13 LAB — CBC
HEMATOCRIT: 39.2 % (ref 39.0–52.0)
HEMOGLOBIN: 13.6 g/dL (ref 13.0–17.0)
MCH: 30.4 pg (ref 26.0–34.0)
MCHC: 34.7 g/dL (ref 30.0–36.0)
MCV: 87.7 fL (ref 78.0–100.0)
Platelets: 106 10*3/uL — ABNORMAL LOW (ref 150–400)
RBC: 4.47 MIL/uL (ref 4.22–5.81)
RDW: 13.4 % (ref 11.5–15.5)
WBC: 4.3 10*3/uL (ref 4.0–10.5)

## 2016-09-13 LAB — VITAMIN B12: Vitamin B-12: 405 pg/mL (ref 180–914)

## 2016-09-13 LAB — MAGNESIUM: Magnesium: 1.8 mg/dL (ref 1.7–2.4)

## 2016-09-13 LAB — CK: Total CK: 121 U/L (ref 49–397)

## 2016-09-13 NOTE — Progress Notes (Signed)
OT Cancellation Note  Patient Details Name: Charles [name] MRN: [id number] DOB: 04/06/1955   Cancelled Treatment:    Reason Eval/Treat Not Completed: Patient at procedure or test/ unavailable. Pt off floor for testing, will check back at a later time/day.    Guadelupe Sabin, OTR/L  [phone] 09/13/2016, 9:04 AM

## 2016-09-13 NOTE — Progress Notes (Signed)
SLP Cancellation Note  Patient Details Name: SHAYON TROMPETER MRN: [id number] DOB: 1955-08-19   Cancelled treatment:       Reason Eval/Treat Not Completed: SLP screened, no needs identified, will sign off. MRI negative for acute stroke, pt reported no changes in speech/ language/ cognition since admission (pt does well with written communication due to deafness). Will sign off at this time; please re-consult if needs arise.   Kern Reap, MA, CCC-SLP 09/13/2016, 1:55 PM

## 2016-09-13 NOTE — Progress Notes (Signed)
PT Cancellation Note  Patient Details Name: Charles [name] MRN: [id number] DOB: [birth year]   Cancelled Treatment:    Reason Eval/Treat Not Completed: Patient at procedure or test/unavailable (pt in EKG at this time. Will check back at a later time/day.)   Geraldine Solar PT, DPT

## 2016-09-13 NOTE — Progress Notes (Signed)
EEG Completed; Results Pending  

## 2016-09-13 NOTE — Progress Notes (Signed)
PROGRESS NOTE  Charles [name]:[id number] DOB: [birth year] DOA: 09/12/2016 PCP: [name], MD  Brief History:  61 year old male with a history of right MCA stroke 2014, seizure disorder, PAD, hypertension, diabetes mellitus, congenital deafness presented after his mother heard him fall and found him on the floor. The patient's history is supplemented by his mother. His mother states that the patient was unconscious for approx 3-4 minutes before he woke up. Apparently, the patient was a bit confused when he woke up and complained that he had some difficulty with his vision. He had denied any chest pain, short of breath, nausea, vomiting, diarrhea. There was no bladder or bowel incontinence. His mother states that within the past 2-3 weeks he's been started on a new oral diabetic medication, which she thinks is glimiperide.  He has also been recently started on naprosyn for his chronic back pain. His onlycomplaint prior to his syncopal episode was that of headaches. There's been no history of worsening left hemiparesis or new right-sided weakness according to patient's mother. In addition, the patient's mother states that the patient has been poorly compliant with his medications. She states, "he's hardheaded. He decides to take his medications only one he wants to." In addition, the patient's mother states that the patient occasionally leaves the house for 3-4 days at a time without informing her. It is unclear where he goes during this period of time. Upon arrival to the emergency department, could stroke was activated. CT of the head was negative for acute findings. Teleneurology did not feel patient was a tPA candidate.  Assessment/Plan: Syncope -Suspect the patient may have had a hypoglycemic episode secondary to his new oral hypoglycemic med -certainly the patient may have had a seizure from hypoglycemia-->check CK -EEG -no documented CBG by EMS -CBG was 61 in the ED -d/c oral  hypoglycemics -check A1C -check CBGs serially -check orthostatics -reviewed telemetry--no concerning dysrhythmias -Personally reviewed EKG--sinus rhythm with early repolarization -Echocardiogram -Urine drug screen--neg  Visual disturbance -Patient complained of visual disturbance after he woke up -MRI brain -Neurology consult -Improved -Carotid duplex -Hemoglobin A1c -Check lipids--LDL 43, triglycerides 288 -Echo -PT eval -continue ASA and plavix--mother states pt noncompliant at home  Seizure disorder -Continue Keppra -Unsure if the patient has been compliant -Check CPK -Discontinue tramadol as this can lower seizure threshold  Essential hypertension -Holding losartan HCTZ for the first 24 hours to allow for permissive hypertension although clinical suspicion for stroke is low  Diabetes mellitus type 2 -Holding oral hypoglycemic agents -NovoLog sliding scale -Hemoglobin A1c  Thrombocytopenia -Appears to be chronic -Serum B12 -HIV -TSH--2.679  Depression -Continue Cymbalta    Disposition Plan:   Home 7/25 if stable Family Communication:   Mother updated on phone 7/24--Total time spent 35 minutes.  Greater than 50% spent face to face counseling and coordinating care.   Consultants:  neurology  Code Status:  FULL   DVT Prophylaxis:   [place] Lovenox   Procedures: As Listed in Progress Note Above  Antibiotics: None    Subjective: Patient denies fevers, chills, headache, chest pain, dyspnea, nausea, vomiting, diarrhea, abdominal pain, dysuria. He complains of back pain    Objective: Vitals:   09/12/16 2100 09/13/16 0100 09/13/16 0300 09/13/16 0500  BP:  131/76 139/89 129/77  Pulse:  66 67 65  Resp:  18 18 18   Temp:  97.6 F (36.4 C) 97.7 F (36.5 C) 97.7 F (36.5 C)  TempSrc:  Oral Oral  Oral  SpO2:  100% 97% 100%  Weight: 75.8 kg (167 lb 3.2 oz)     Height: 5\' 4"  (1.626 m)       Intake/Output Summary (Last 24 hours) at 09/13/16 0758 Last  data filed at 09/13/16 1751  Gross per 24 hour  Intake              835 ml  Output              400 ml  Net              435 ml   Weight change:  Exam:   General:  Pt is alert, follows commands appropriately, not in acute distress  HEENT: No icterus, No thrush, No neck mass, [place]/AT  Cardiovascular: RRR, S1/S2, no rubs, no gallops  Respiratory: CTA bilaterally, no wheezing, no crackles, no rhonchi  Abdomen: Soft/+BS, non tender, non distended, no guarding  Extremities: No edema, No lymphangitis, No petechiae, No rashes, no synovitis   Data Reviewed: I have personally reviewed following labs and imaging studies Basic Metabolic Panel:  Recent Labs Lab 09/12/16 1428 09/12/16 1452  NA 136 139  K 3.8 3.9  CL 97* 96*  CO2 31  --   GLUCOSE 74 73  BUN 15 15  CREATININE 1.32* 1.30*  CALCIUM 8.8*  --    Liver Function Tests:  Recent Labs Lab 09/12/16 1428  AST 27  ALT 25  ALKPHOS 60  BILITOT 0.6  PROT 7.2  ALBUMIN 4.2   No results for input(s): LIPASE, AMYLASE in the last 168 hours. No results for input(s): AMMONIA in the last 168 hours. Coagulation Profile:  Recent Labs Lab 09/12/16 1428  INR 0.92   CBC:  Recent Labs Lab 09/12/16 1428 09/12/16 1452  WBC 6.5  --   NEUTROABS 3.7  --   HGB 14.8 15.3  HCT 42.3 45.0  MCV 87.8  --   PLT 117*  --    Cardiac Enzymes:  Recent Labs Lab 09/12/16 2024 09/13/16 0202  TROPONINI <0.03 <0.03   BNP: Invalid input(s): POCBNP CBG:  Recent Labs Lab 09/12/16 1458 09/12/16 1535 09/12/16 1608 09/12/16 1747 09/12/16 2105  GLUCAP 61* 108* 98 85 87   HbA1C: No results for input(s): HGBA1C in the last 72 hours. Urine analysis:    Component Value Date/Time   COLORURINE STRAW (A) 09/12/2016 1450   APPEARANCEUR CLEAR 09/12/2016 1450   LABSPEC 1.005 09/12/2016 1450   PHURINE 7.0 09/12/2016 1450   GLUCOSEU 50 (A) 09/12/2016 1450   HGBUR NEGATIVE 09/12/2016 1450   BILIRUBINUR NEGATIVE 09/12/2016 1450    KETONESUR NEGATIVE 09/12/2016 1450   PROTEINUR NEGATIVE 09/12/2016 1450   UROBILINOGEN 0.2 10/02/2012 2337   NITRITE NEGATIVE 09/12/2016 1450   LEUKOCYTESUR NEGATIVE 09/12/2016 1450   Sepsis Labs: @LABRCNTIP (procalcitonin:4,lacticidven:4) )No results found for this or any previous visit (from the past 240 hour(s)).   Scheduled Meds: .  stroke: mapping our early stages of recovery book   Does not apply Once  . aspirin  325 mg Oral Daily  . atorvastatin  10 mg Oral q1800  . citalopram  10 mg Oral Daily  . clopidogrel  75 mg Oral Daily  . DULoxetine  60 mg Oral Daily  . enoxaparin (LOVENOX) injection  40 mg Subcutaneous Q24H  . gabapentin  300 mg Oral TID  . insulin aspart  0-9 Units Subcutaneous TID WC  . levETIRAcetam  500 mg Oral BID  . mometasone-formoterol  2 puff Inhalation Q12H  .  oxybutynin  15 mg Oral QHS  . pantoprazole  80 mg Oral Daily  . predniSONE  1 mg Oral Q breakfast  . tiotropium  18 mcg Inhalation Daily  . tiZANidine  2 mg Oral TID   Continuous Infusions: . sodium chloride    . sodium chloride 75 mL/hr (09/12/16 2239)    Procedures/Studies: Dg Chest 2 View  Result Date: 09/12/2016 CLINICAL DATA:  The sided chest pain and shortness of Breath EXAM: CHEST  2 VIEW COMPARISON:  04/17/2015 FINDINGS: Cardiac shadows within normal limits. Multiple calcified hilar and mediastinal lymph nodes are seen and stable. Patchy scarring is noted in the right lung base similar to that seen on the prior exam. Some scattered calcified granulomas are seen. Postsurgical changes in the cervical spine are noted. No acute bony abnormality is seen. IMPRESSION: Changes of prior granulomatous disease.  No acute abnormality seen. Electronically Signed   By: Inez Catalina M.D.   On: 09/12/2016 21:48   Ct Head Code Stroke W/o Cm  Result Date: 09/12/2016 CLINICAL DATA:  Code stroke. Acute onset right-sided weakness. Code stroke. EXAM: CT HEAD WITHOUT CONTRAST TECHNIQUE: Contiguous axial images  were obtained from the base of the skull through the vertex without intravenous contrast. COMPARISON:  CT head without contrast 04/15/2015 FINDINGS: Brain: Chronic right MCA territory encephalomalacia is stable. Advanced periventricular and subcortical white matter hypoattenuation is present bilaterally without significant change. Remote right basal ganglia infarcts are again noted. The left basal ganglia are are intact. The insular ribbon is intact on the left. No focal cortical lesions are present. Vascular: Atherosclerotic calcifications are present within the cavernous internal carotid artery's bilaterally. Skull: The calvarium is intact. No focal lytic or blastic lesions are present. Sinuses/Orbits: The paranasal sinuses and mastoid air cells are clear. Globes and orbits are within normal limits. ASPECTS [facility] Stroke Program Early CT Score) - Ganglionic level infarction (caudate, lentiform nuclei, internal capsule, insula, M1-M3 cortex): 7/7 - Supraganglionic infarction (M4-M6 cortex): 3/3 Total score (0-10 with 10 being normal): 10/10 IMPRESSION: 1. No acute or subacute left-sided infarct. 2. Chronic right MCA encephalomalacia is stable. 3. Stable chronic atrophy and white matter disease bilaterally. 4. ASPECTS is 10/10 These results were called by telephone at the time of interpretation on 09/12/2016 at 3:02 pm to Dr. Roderic Palau , who verbally acknowledged these results. Electronically Signed   By: San Morelle M.D.   On: 09/12/2016 15:04    Dejai Schubach, DO  [facility] Pager [phone]  If 7PM-7AM, please contact night-coverage [email or web address] Password [email or web address] 09/13/2016, 7:58 AM   LOS: 0 days

## 2016-09-13 NOTE — Care Management Obs Status (Signed)
[facility] NOTIFICATION   Patient Details  Name: TRESEAN MATTIX MRN: [id number] Date of Birth: [birth year]   Medicare Observation Status Notification Given:  Yes    Sherald Barge, RN 09/13/2016, 1:09 PM

## 2016-09-13 NOTE — Progress Notes (Signed)
*  PRELIMINARY RESULTS* Echocardiogram 2D Echocardiogram has been performed.  Charles [name] 09/13/2016, 4:19 PM

## 2016-09-14 DIAGNOSIS — M5441 Lumbago with sciatica, right side: Secondary | ICD-10-CM

## 2016-09-14 DIAGNOSIS — I10 Essential (primary) hypertension: Secondary | ICD-10-CM | POA: Diagnosis not present

## 2016-09-14 DIAGNOSIS — D696 Thrombocytopenia, unspecified: Secondary | ICD-10-CM | POA: Diagnosis not present

## 2016-09-14 DIAGNOSIS — I69398 Other sequelae of cerebral infarction: Secondary | ICD-10-CM | POA: Diagnosis not present

## 2016-09-14 DIAGNOSIS — E1151 Type 2 diabetes mellitus with diabetic peripheral angiopathy without gangrene: Secondary | ICD-10-CM | POA: Diagnosis not present

## 2016-09-14 DIAGNOSIS — R4701 Aphasia: Secondary | ICD-10-CM | POA: Diagnosis not present

## 2016-09-14 DIAGNOSIS — G40909 Epilepsy, unspecified, not intractable, without status epilepticus: Secondary | ICD-10-CM | POA: Diagnosis not present

## 2016-09-14 DIAGNOSIS — G8929 Other chronic pain: Secondary | ICD-10-CM

## 2016-09-14 DIAGNOSIS — R55 Syncope and collapse: Secondary | ICD-10-CM | POA: Diagnosis not present

## 2016-09-14 LAB — ECHOCARDIOGRAM COMPLETE
AVLVOTPG: 6 mmHg
E/e' ratio: 7.29
EWDT: 187 ms
FS: 37 % (ref 28–44)
HEIGHTINCHES: 64 in
IVS/LV PW RATIO, ED: 1.27
LA diam end sys: 35 mm
LA diam index: 1.87 cm/m2
LA vol A4C: 31 ml
LASIZE: 35 mm
LAVOL: 31.6 mL
LAVOLIN: 16.9 mL/m2
LDCA: 2.84 cm2
LV E/e' medial: 7.29
LV E/e'average: 7.29
LV dias vol index: 32 mL/m2
LV e' LATERAL: 7.72 cm/s
LV sys vol: 24 mL (ref 21–61)
LVDIAVOL: 59 mL — AB (ref 62–150)
LVOT SV: 70 mL
LVOT VTI: 24.8 cm
LVOT peak vel: 121 cm/s
LVOTD: 19 mm
LVSYSVOLIN: 13 mL/m2
MV Dec: 187
MV pk A vel: 80.2 m/s
MV pk E vel: 56.3 m/s
PW: 11.2 mm — AB (ref 0.6–1.1)
RV LATERAL S' VELOCITY: 11.3 cm/s
RV TAPSE: 24 mm
Simpson's disk: 59
Stroke v: 35 ml
TDI e' lateral: 7.72
TDI e' medial: 7.72
WEIGHTICAEL: 2675.2 [oz_av]

## 2016-09-14 LAB — GLUCOSE, CAPILLARY
GLUCOSE-CAPILLARY: 94 mg/dL (ref 65–99)
GLUCOSE-CAPILLARY: 172 mg/dL — AB (ref 65–99)

## 2016-09-14 LAB — HEMOGLOBIN A1C
Hgb A1c MFr Bld: 5.8 % — ABNORMAL HIGH (ref 4.8–5.6)
MEAN PLASMA GLUCOSE: 120 mg/dL

## 2016-09-14 LAB — HIV ANTIBODY (ROUTINE TESTING W REFLEX): HIV SCREEN 4TH GENERATION: NONREACTIVE

## 2016-09-14 MED ORDER — STROKE: EARLY STAGES OF RECOVERY BOOK
Freq: Once | Status: AC
  Administered 2016-09-14: 16:00:00
  Filled 2016-09-14: qty 1

## 2016-09-14 MED ORDER — OXYCODONE-ACETAMINOPHEN 5-325 MG PO TABS: 1.0000 | ORAL_TABLET | Freq: Four times a day (QID) | ORAL | Status: DC | PRN

## 2016-09-14 NOTE — Progress Notes (Signed)
Tele interpreter Waylan Rocher [phone]) used to comumicate plan of care with patient.  Pt has expressed desire for discharge. Results of MRI and CT relayed to pateint. Patient states he blacked out and that was what brought him to the hospital. He Stated that he had a Stroke in 2014 which has resulted in weakness to his left side. He denies new symptoms  Dr Merlene Laughter will be notified of this conversation.

## 2016-09-14 NOTE — Plan of Care (Signed)
Problem: Education: Goal: Knowledge of disease or condition will improve Outcome: Completed/Met Date Met: 09/14/16 Pt has had stroke in 2014 He is aware of disease and risk factors

## 2016-09-14 NOTE — Discharge Summary (Signed)
Physician Discharge Summary  SENECA HOBACK MBE:[id number] DOB: Jun 26, 1955 DOA: 09/12/2016  PCP: [name], MD  Admit date: 09/12/2016 Discharge date: 09/14/2016  Admitted From: home Disposition:  home  Recommendations for Outpatient Follow-up:  1. Follow up with PCP in 1-2 weeks 2. Please obtain BMP/CBC in one week 3. januvia discontinued until follow up with pcp 4. Consider referral to neurosurgery for low back pain  Home Health: HHPT Equipment/Devices:  Discharge Condition:stable CODE STATUS: full code Diet recommendation: Heart Healthy / Carb Modified   Brief/Interim Summary: 61 year old male with a history of right MCA stroke 2014, seizure disorder, PAD, hypertension, diabetes mellitus, congenital deafness presented after his mother heard him fall and found him on the floor. The patient's history is supplemented by his mother. His mother states that the patient was unconscious for approx 3-4 minutes before he woke up. Apparently, the patient was a bit confused when he woke up and complained that he had some difficulty with his vision. He had denied any chest pain, short of breath, nausea, vomiting, diarrhea. There was no bladder or bowel incontinence. His mother states that within the past 2-3 weeks he's been started on a new oral diabetic medication, which she thinks is glimiperide.  He has also been recently started on naprosyn for his chronic back pain. His onlycomplaint prior to his syncopal episode was that of headaches. There's been no history of worsening left hemiparesis or new right-sided weakness according to patient's mother. In addition, the patient's mother states that the patient has been poorly compliant with his medications. She states, "he's hardheaded. He decides to take his medications only when he wants to." In addition, the patient's mother states that the patient occasionally leaves the house for 3-4 days at a time without informing her. It is unclear where he goes  during this period of time. Upon arrival to the emergency department, code stroke was activated. CT of the head was negative for acute findings. Teleneurology did not feel patient was a tPA candidate.  Discharge Diagnoses:  Principal Problem:   Syncope Active Problems:   CVA (cerebral vascular accident) ([place])   Diabetes ([place])   Sarcoidosis ([place])   Deaf mutism, congenital   Essential hypertension, benign   Spastic hemiplegia affecting left nondominant side ([place])   Solitary pulmonary nodule   Headache   Low back pain   Spinal stenosis in cervical region   MDD (major depressive disorder), recurrent episode, severe ([place])   Seizure disorder as sequela of cerebrovascular accident ([place])   Thrombocytopenia ([place])   Hypoglycemia  Patient was admitted for further evaluation of syncope. MRI brain did not show any acute findings. EEG was also unremarkable. Echocardiogram did not show any significant findings. Orthostatics noted to be negative. He was seen by neurology was not felt the patient had any seizure. Recommendations were to continue Keppra at current dosing. For his cerebrovascular disease, it was noted that he was on aspirin and Plavix. Per neurology, he does not need dual antiplatelet therapy and recommendations were to discontinue Plavix, but continue aspirin on discharge. It was noted that on admission, patient was hypoglycemic. Most likely the etiology of his symptoms. Januvia was discontinued on admission. Blood sugars have been stable. Would recommend holding off on resuming Januvia until he follows up with primary care physician. He's been advised to check his blood sugars closely at home. Regarding his chronic back pain, review of MRI from one year ago indicated disc bulging. He is advised to continue with pain management and  physical therapy. May need referral back to his neurosurgeon if symptoms do not improve.  Discharge Instructions  Discharge Instructions    Diet - low sodium  heart healthy    Complete by:  As directed    Increase activity slowly    Complete by:  As directed      Allergies as of 09/14/2016      Reactions   Shellfish Allergy Hives, Swelling   SWELLING REACTION UNSPECIFIED   Hydrocodone Other (See Comments)   Makes "looney"   Contrast Media [iodinated Diagnostic Agents] Itching   Oxycodone Nausea And Vomiting      Medication List    STOP taking these medications   clopidogrel 75 MG tablet Commonly known as:  PLAVIX   DULoxetine 60 MG capsule Commonly known as:  CYMBALTA   sitaGLIPtin 100 MG tablet Commonly known as:  JANUVIA   traMADol 50 MG tablet Commonly known as:  ULTRAM     TAKE these medications   aspirin 325 MG EC tablet Take 325 mg by mouth daily.   atorvastatin 10 MG tablet Commonly known as:  LIPITOR Take 10 mg by mouth daily. (cholesterol)   cholecalciferol 1000 units tablet Commonly known as:  VITAMIN D Take 1,000 Units by mouth daily.   escitalopram 5 MG tablet Commonly known as:  LEXAPRO Take 5 mg by mouth daily.   esomeprazole 40 MG capsule Commonly known as:  NEXIUM Take 1 capsule (40 mg total) by mouth 2 (two) times daily. What changed:  when to take this   gabapentin 300 MG capsule Commonly known as:  NEURONTIN Take 1 capsule (300 mg total) by mouth 3 (three) times daily.   glucose blood test strip Commonly known as:  ONETOUCH VERIO 1 each by Other route daily. And lancets 1/day   hydrochlorothiazide 12.5 MG capsule Commonly known as:  MICROZIDE Take 12.5 mg by mouth daily.   levETIRAcetam 500 MG tablet Commonly known as:  KEPPRA Take 1 tablet (500 mg total) by mouth 2 (two) times daily.   losartan 50 MG tablet Commonly known as:  COZAAR Take 50 mg by mouth daily.   Melatonin 5 MG Tabs Take 5 mg by mouth at bedtime.   mometasone-formoterol 100-5 MCG/ACT Aero Commonly known as:  DULERA Inhale 2 puffs into the lungs every 12 (twelve) hours.   naproxen 375 MG tablet Commonly known  as:  NAPROSYN Take 375 mg by mouth 2 (two) times daily with a meal.   oxybutynin 15 MG 24 hr tablet Commonly known as:  DITROPAN XL Take 15 mg by mouth at bedtime.   predniSONE 1 MG tablet Commonly known as:  DELTASONE Take 1 mg by mouth daily with breakfast.   PROAIR HFA 108 (90 Base) MCG/ACT inhaler Generic drug:  albuterol Inhale 2 puffs into the lungs every 6 (six) hours as needed for wheezing or shortness of breath.   SPIRIVA HANDIHALER 18 MCG inhalation capsule Generic drug:  tiotropium Place 18 mcg into inhaler and inhale daily.   tiZANidine 2 MG tablet Commonly known as:  ZANAFLEX Take 1 tablet (2 mg total) by mouth 3 (three) times daily.       Allergies  Allergen Reactions  . Shellfish Allergy Hives and Swelling    SWELLING REACTION UNSPECIFIED   . Hydrocodone Other (See Comments)    Makes "looney"  . Contrast Media [Iodinated Diagnostic Agents] Itching  . Oxycodone Nausea And Vomiting    Consultations:  neurology   Procedures/Studies: Dg Chest 2 View  Result  Date: 09/12/2016 CLINICAL DATA:  The sided chest pain and shortness of Breath EXAM: CHEST  2 VIEW COMPARISON:  04/17/2015 FINDINGS: Cardiac shadows within normal limits. Multiple calcified hilar and mediastinal lymph nodes are seen and stable. Patchy scarring is noted in the right lung base similar to that seen on the prior exam. Some scattered calcified granulomas are seen. Postsurgical changes in the cervical spine are noted. No acute bony abnormality is seen. IMPRESSION: Changes of prior granulomatous disease.  No acute abnormality seen. Electronically Signed   By: Inez Catalina M.D.   On: 09/12/2016 21:48   Mr Brain Wo Contrast  Result Date: 09/13/2016 CLINICAL DATA:  Altered mental status and right-sided weakness over the last day. EXAM: MRI HEAD WITHOUT CONTRAST MRA HEAD WITHOUT CONTRAST TECHNIQUE: Multiplanar, multiecho pulse sequences of the brain and surrounding structures were obtained without  intravenous contrast. Angiographic images of the head were obtained using MRA technique without contrast. COMPARISON:  CT 09/12/2016.  MRI 08/05/2014. FINDINGS: MRI HEAD FINDINGS Brain: Diffusion imaging does not show any acute or subacute infarction. There are extensive chronic small-vessel ischemic changes affecting the brainstem. There is wall air in degeneration on the right. No focal cerebellar insult. Cerebral hemispheres show old infarction in the right middle cerebral artery territory with atrophy, encephalomalacia and gliosis. Chronic small-vessel ischemic changes are present throughout the hemispheric white matter. No mass lesion, hemorrhage, hydrocephalus or extra-axial collection. Vascular: Major vessels at the base of the brain show flow. Skull and upper cervical spine: Negative Sinuses/Orbits: Clear/normal Other: None significant MRA HEAD FINDINGS Both internal carotid arteries are widely patent into the brain. Left anterior and middle cerebral arteries are patent. There appears to be 50% stenosis at the superior division left M2 branch origin. This could place the patient at risk of MCA infarction. There may also be a stenosis at the A1 origin on the left. On the right, the internal carotid artery is patent through the skullbase. There is middle cerebral artery occlusion at the origin. There is stenosis of the A1 segment which appears severe. There are large posterior communicating arteries bilaterally. Right vertebral artery is a tiny vessel which shows antegrade flow to PICA but not beyond. No antegrade flow seen in the left vertebral. No flow seen in the proximal basilar. Posterior communicating arteries give supply to both posterior cerebral arteries and both superior cerebellar arteries. A IMPRESSION: No acute finding by MRI. Chronic infarction throughout the right middle cerebral artery territory with atrophy, encephalomalacia and gliosis. Chronic small-vessel ischemic changes diffusely. Right  MCA occlusion, presumably chronic. Left vertebral occlusion. Right vertebral occlusion distal to PICA. No flow seen in the proximal basilar artery. Severe stenosis of the A1 segment on the right. Potentially significant stenoses of the A1 and M2 vessels on the left. Large posterior communicating arteries give supply to the distal posterior circulation. Electronically Signed   By: Nelson Chimes M.D.   On: 09/13/2016 08:58   US Carotid Bilateral (at [facility] And Ap Only)  Result Date: 09/13/2016 CLINICAL DATA:  61 year old male with symptoms of transient ischemic attack and a past history of stroke (right MCA) in 2014 EXAM: BILATERAL CAROTID DUPLEX ULTRASOUND TECHNIQUE: Pearline Cables scale imaging, color Doppler and duplex ultrasound were performed of bilateral carotid and vertebral arteries in the neck. COMPARISON:  MRI/MRA brain 09/13/2016 FINDINGS: Criteria: Quantification of carotid stenosis is based on velocity parameters that correlate the residual internal carotid diameter with NASCET-based stenosis levels, using the diameter of the distal internal carotid lumen as the denominator  for stenosis measurement. The following velocity measurements were obtained: RIGHT ICA:  56/25 cm/sec CCA:  93/81 cm/sec SYSTOLIC ICA/CCA RATIO:  0.9 DIASTOLIC ICA/CCA RATIO:  1.4 ECA:  80 cm/sec LEFT ICA:  75/34 cm/sec CCA:  01/75 cm/sec SYSTOLIC ICA/CCA RATIO:  1.1 DIASTOLIC ICA/CCA RATIO:  1.6 ECA:  77 cm/sec RIGHT CAROTID ARTERY: Mild smooth heterogeneous atherosclerotic plaque in the distal common carotid artery extending into the proximal internal carotid artery. By peak systolic velocity criteria, the estimated stenosis remains less than 50%. RIGHT VERTEBRAL ARTERY:  Patent with normal antegrade flow. LEFT CAROTID ARTERY: Mild heterogeneous atherosclerotic plaque in the distal common carotid artery extending into the proximal internal carotid artery. By peak systolic velocity criteria, the estimated stenosis remains less than 50%. LEFT  VERTEBRAL ARTERY: Patent low with an abnormal waveform. There is very poor diastolic flow suggest the possibility of distal occlusion. IMPRESSION: 1. Mild (1-49%) stenosis proximal right internal carotid artery secondary to smooth heterogeneous atherosclerotic plaque. 2. Mild (1-49%) stenosis proximal left internal carotid artery secondary to smooth heterogeneous atherosclerotic plaque. 3. Both vertebral arteries are patent with antegrade flow in the neck. However, the left vertebral artery waveform is slightly abnormal suggesting the possibility of distal occlusion. Signed, Criselda Peaches, MD [facility] Electronically Signed   By: Jacqulynn Cadet M.D.   On: 09/13/2016 10:09   Mr Jodene Nam Head/brain ZW Cm  Result Date: 09/13/2016 CLINICAL DATA:  Altered mental status and right-sided weakness over the last day. EXAM: MRI HEAD WITHOUT CONTRAST MRA HEAD WITHOUT CONTRAST TECHNIQUE: Multiplanar, multiecho pulse sequences of the brain and surrounding structures were obtained without intravenous contrast. Angiographic images of the head were obtained using MRA technique without contrast. COMPARISON:  CT 09/12/2016.  MRI 08/05/2014. FINDINGS: MRI HEAD FINDINGS Brain: Diffusion imaging does not show any acute or subacute infarction. There are extensive chronic small-vessel ischemic changes affecting the brainstem. There is wall air in degeneration on the right. No focal cerebellar insult. Cerebral hemispheres show old infarction in the right middle cerebral artery territory with atrophy, encephalomalacia and gliosis. Chronic small-vessel ischemic changes are present throughout the hemispheric white matter. No mass lesion, hemorrhage, hydrocephalus or extra-axial collection. Vascular: Major vessels at the base of the brain show flow. Skull and upper cervical spine: Negative Sinuses/Orbits: Clear/normal Other: None significant MRA HEAD FINDINGS Both internal  carotid arteries are widely patent into the brain. Left anterior and middle cerebral arteries are patent. There appears to be 50% stenosis at the superior division left M2 branch origin. This could place the patient at risk of MCA infarction. There may also be a stenosis at the A1 origin on the left. On the right, the internal carotid artery is patent through the skullbase. There is middle cerebral artery occlusion at the origin. There is stenosis of the A1 segment which appears severe. There are large posterior communicating arteries bilaterally. Right vertebral artery is a tiny vessel which shows antegrade flow to PICA but not beyond. No antegrade flow seen in the left vertebral. No flow seen in the proximal basilar. Posterior communicating arteries give supply to both posterior cerebral arteries and both superior cerebellar arteries. A IMPRESSION: No acute finding by MRI. Chronic infarction throughout the right middle cerebral artery territory with atrophy, encephalomalacia and gliosis. Chronic small-vessel ischemic changes diffusely. Right MCA occlusion, presumably chronic. Left vertebral occlusion. Right vertebral occlusion distal to PICA. No flow seen in the proximal basilar artery. Severe stenosis of the A1 segment on the right. Potentially  significant stenoses of the A1 and M2 vessels on the left. Large posterior communicating arteries give supply to the distal posterior circulation. Electronically Signed   By: Nelson Chimes M.D.   On: 09/13/2016 08:58   Ct Head Code Stroke W/o Cm  Result Date: 09/12/2016 CLINICAL DATA:  Code stroke. Acute onset right-sided weakness. Code stroke. EXAM: CT HEAD WITHOUT CONTRAST TECHNIQUE: Contiguous axial images were obtained from the base of the skull through the vertex without intravenous contrast. COMPARISON:  CT head without contrast 04/15/2015 FINDINGS: Brain: Chronic right MCA territory encephalomalacia is stable. Advanced periventricular and subcortical white matter  hypoattenuation is present bilaterally without significant change. Remote right basal ganglia infarcts are again noted. The left basal ganglia are are intact. The insular ribbon is intact on the left. No focal cortical lesions are present. Vascular: Atherosclerotic calcifications are present within the cavernous internal carotid artery's bilaterally. Skull: The calvarium is intact. No focal lytic or blastic lesions are present. Sinuses/Orbits: The paranasal sinuses and mastoid air cells are clear. Globes and orbits are within normal limits. ASPECTS [facility] Stroke Program Early CT Score) - Ganglionic level infarction (caudate, lentiform nuclei, internal capsule, insula, M1-M3 cortex): 7/7 - Supraganglionic infarction (M4-M6 cortex): 3/3 Total score (0-10 with 10 being normal): 10/10 IMPRESSION: 1. No acute or subacute left-sided infarct. 2. Chronic right MCA encephalomalacia is stable. 3. Stable chronic atrophy and white matter disease bilaterally. 4. ASPECTS is 10/10 These results were called by telephone at the time of interpretation on 09/12/2016 at 3:02 pm to Dr. Roderic Palau , who verbally acknowledged these results. Electronically Signed   By: [place] M.D.   On: 09/12/2016 15:04    Echo: - Left ventricle: The cavity size was normal. Wall thickness was   increased in a pattern of mild LVH. Systolic function was normal.   The estimated ejection fraction was in the range of 60% to 65%.   Wall motion was normal; there were no regional wall motion   abnormalities. Doppler parameters are consistent with abnormal   left ventricular relaxation (grade 1 diastolic dysfunction). - Aortic valve: Valve area (VTI): 2.67 cm^2. Valve area (Vmax):   2.67 cm^2. Valve area (Vmean): 2.64 cm^2. - Technically adequate study.  EEG: This a normal recording awake and sleep states.   Subjective: Complains of lower back pain. No dizziness or lightheadedness  Discharge Exam: Vitals:   09/14/16 0658 09/14/16  1446  BP: (!) 163/89 (!) 158/91  Pulse: 63 67  Resp: 18 20  Temp: 97.7 F (36.5 C) 98.2 F (36.8 C)   Vitals:   09/14/16 0658 09/14/16 0745 09/14/16 0746 09/14/16 1446  BP: (!) 163/89   (!) 158/91  Pulse: 63   67  Resp: 18   20  Temp: 97.7 F (36.5 C)   98.2 F (36.8 C)  TempSrc: Oral   Oral  SpO2: 100% 97% 96% 97%  Weight:      Height:        General: Pt is alert, awake, not in acute distress Cardiovascular: RRR, S1/S2 +, no rubs, no gallops Respiratory: CTA bilaterally, no wheezing, no rhonchi Abdominal: Soft, NT, ND, bowel sounds + Extremities: no edema, no cyanosis    The results of significant diagnostics from this hospitalization (including imaging, microbiology, ancillary and laboratory) are listed below for reference.     Microbiology: No results found for this or any previous visit (from the past 240 hour(s)).   Labs: BNP (last 3 results) No results for input(s): BNP in the  last 8760 hours. Basic Metabolic Panel:  Recent Labs Lab 09/12/16 1428 09/12/16 1452 09/13/16 0743  NA 136 139 137  K 3.8 3.9 3.8  CL 97* 96* 100*  CO2 31  --  30  GLUCOSE 74 73 117*  BUN 15 15 13   CREATININE 1.32* 1.30* 1.08  CALCIUM 8.8*  --  8.5*  MG  --   --  1.8   Liver Function Tests:  Recent Labs Lab 09/12/16 1428  AST 27  ALT 25  ALKPHOS 60  BILITOT 0.6  PROT 7.2  ALBUMIN 4.2   No results for input(s): LIPASE, AMYLASE in the last 168 hours. No results for input(s): AMMONIA in the last 168 hours. CBC:  Recent Labs Lab 09/12/16 1428 09/12/16 1452 09/13/16 0743  WBC 6.5  --  4.3  NEUTROABS 3.7  --   --   HGB 14.8 15.3 13.6  HCT 42.3 45.0 39.2  MCV 87.8  --  87.7  PLT 117*  --  106*   Cardiac Enzymes:  Recent Labs Lab 09/12/16 2024 09/13/16 0202 09/13/16 0743  CKTOTAL  --   --  121  TROPONINI <0.03 <0.03 <0.03   BNP: Invalid input(s): POCBNP CBG:  Recent Labs Lab 09/13/16 0934 09/13/16 1117 09/13/16 1620 09/14/16 0726 09/14/16 1144   GLUCAP 128* 197* 89 94 172*   D-Dimer No results for input(s): DDIMER in the last 72 hours. Hgb A1c  Recent Labs  09/12/16 1428  HGBA1C 5.8*   Lipid Profile  Recent Labs  09/13/16 0202  CHOL 160  HDL 59  LDLCALC 43  TRIG 288*  CHOLHDL 2.7   Thyroid function studies  Recent Labs  09/12/16 2024  TSH 2.679   Anemia work up  Recent Labs  09/12/16 2024  VITAMINB12 405   Urinalysis    Component Value Date/Time   COLORURINE STRAW (A) 09/12/2016 1450   APPEARANCEUR CLEAR 09/12/2016 1450   LABSPEC 1.005 09/12/2016 1450   PHURINE 7.0 09/12/2016 1450   GLUCOSEU 50 (A) 09/12/2016 1450   HGBUR NEGATIVE 09/12/2016 1450   BILIRUBINUR NEGATIVE 09/12/2016 1450   KETONESUR NEGATIVE 09/12/2016 1450   PROTEINUR NEGATIVE 09/12/2016 1450   UROBILINOGEN 0.2 10/02/2012 2337   NITRITE NEGATIVE 09/12/2016 1450   LEUKOCYTESUR NEGATIVE 09/12/2016 1450   Sepsis Labs Invalid input(s): PROCALCITONIN,  WBC,  LACTICIDVEN Microbiology No results found for this or any previous visit (from the past 240 hour(s)).   Time coordinating discharge: Over 30 minutes  SIGNED:   Kathie Dike, MD  [facility] 09/14/2016, 4:09 PM Pager   If 7PM-7AM, please contact night-coverage [email or web address] Password [email or web address]

## 2016-09-14 NOTE — Procedures (Signed)
Charles A. Merlene Laughter, MD     [email or web address]           HISTORY: The patient is 61 year old presents with altered mental status and loss of consciousness. There is a baseline history of seizures. The studies being evaluated for seizure cause of the patient's alteration of mentation.  MEDICATIONS: Scheduled Meds: . aspirin  325 mg Oral Daily  . atorvastatin  10 mg Oral q1800  . citalopram  10 mg Oral Daily  . clopidogrel  75 mg Oral Daily  . enoxaparin (LOVENOX) injection  40 mg Subcutaneous Q24H  . gabapentin  300 mg Oral TID  . insulin aspart  0-9 Units Subcutaneous TID WC  . levETIRAcetam  500 mg Oral BID  . mometasone-formoterol  2 puff Inhalation Q12H  . oxybutynin  15 mg Oral QHS  . pantoprazole  80 mg Oral Daily  . predniSONE  1 mg Oral Q breakfast  . tiotropium  18 mcg Inhalation Daily  . tiZANidine  2 mg Oral TID   Continuous Infusions: . sodium chloride     PRN Meds:.acetaminophen **OR** acetaminophen (TYLENOL) oral liquid 160 mg/5 mL **OR** acetaminophen, albuterol, LORazepam, senna-docusate  Prior to Admission medications   Medication Sig Start Date End Date Taking? Authorizing Provider  albuterol (PROAIR HFA) 108 (90 Base) MCG/ACT inhaler Inhale 2 puffs into the lungs every 6 (six) hours as needed for wheezing or shortness of breath.   Yes [provider]  aspirin 325 MG EC tablet Take 325 mg by mouth daily.   Yes [provider]  atorvastatin (LIPITOR) 10 MG tablet Take 10 mg by mouth daily. (cholesterol)   Yes [provider]  cholecalciferol (VITAMIN D) 1000 units tablet Take 1,000 Units by mouth daily.   Yes [provider]  clopidogrel (PLAVIX) 75 MG tablet Take 75 mg by mouth daily.   Yes [provider]  escitalopram (LEXAPRO) 5 MG tablet Take 5 mg by mouth daily.    Yes [provider]  esomeprazole (NEXIUM) 40 MG capsule Take 1 capsule (40 mg total) by mouth 2 (two) times  daily. Patient taking differently: Take 40 mg by mouth daily.  04/17/15  Yes Carmin Muskrat, MD  glucose blood [facility] VERIO) test strip 1 each by Other route daily. And lancets 1/day 06/03/16  Yes Renato Shin, MD  hydrochlorothiazide (MICROZIDE) 12.5 MG capsule Take 12.5 mg by mouth daily.   Yes [provider]  losartan (COZAAR) 50 MG tablet Take 50 mg by mouth daily.   Yes [provider]  Melatonin 5 MG TABS Take 5 mg by mouth at bedtime.   Yes [provider]  mometasone-formoterol (DULERA) 100-5 MCG/ACT AERO Inhale 2 puffs into the lungs every 12 (twelve) hours. 02/09/16  Yes Tanda Rockers, MD  naproxen (NAPROSYN) 375 MG tablet Take 375 mg by mouth 2 (two) times daily with a meal.   Yes [provider]  predniSONE (DELTASONE) 1 MG tablet Take 1 mg by mouth daily with breakfast.   Yes [provider]  sitaGLIPtin (JANUVIA) 100 MG tablet Take 100 mg by mouth daily.   Yes [provider]  tiotropium (SPIRIVA HANDIHALER) 18 MCG inhalation capsule Place 18 mcg into inhaler and inhale daily.    Yes [provider]  traMADol (ULTRAM) 50 MG tablet Take 1 tablet (50 mg total) by mouth every 8 (eight) hours as needed. Patient taking differently: Take 50 mg by mouth every 8 (eight) hours as needed (pain).  08/12/15  Yes Marcial Pacas, MD  DULoxetine (CYMBALTA) 60 MG capsule Take 1 capsule (60 mg total) by mouth daily. Patient not taking: Reported on 09/13/2016 03/08/16   Marcial Pacas, MD  gabapentin (NEURONTIN) 300 MG capsule Take 1 capsule (300 mg total) by mouth 3 (three) times daily. Patient not taking: Reported on 09/13/2016 12/07/15   Marcial Pacas, MD  levETIRAcetam (KEPPRA) 500 MG tablet Take 1 tablet (500 mg total) by mouth 2 (two) times daily. 05/23/16   Rolland Porter, MD  oxybutynin (DITROPAN XL) 15 MG 24 hr tablet Take 15 mg by mouth at bedtime.    [provider]  tiZANidine (ZANAFLEX) 2 MG tablet Take 1 tablet (2 mg total) by  mouth 3 (three) times daily. Patient not taking: Reported on 09/13/2016 03/08/16   Marcial Pacas, MD      ANALYSIS: A 16 channel recording using standard 10 20 measurements is conducted for 20 minutes. There is a well-formed posterior dominant rhythm of 9-10 Hz which attenuates with eye opening. There is beta activity seen throughout the recording. Awake and sleep activities are observed. K complexes and sleep spindles are documented indicating stage II non-REM sleep. Photic simulation and hypoventilation are not conducted. There is no focal or lateral slowing. There is no epileptiform activities observed.   IMPRESSION: 1. This a normal recording awake and sleep states.      Charles [name] A. Merlene [name], M.D.  Diplomate, Tax adviser of Psychiatry and Neurology ( Neurology).

## 2016-09-14 NOTE — Care Management Note (Signed)
Case Management Note  Patient Details  Name: Charles [name] MRN: [id number] Date of Birth: Oct 31, 1955  Subjective/Objective:                  Pt admitted with syncope. Pt lives at home with mother. Pt will need HH PT. CM used written communication with pt. He would like to use [facility], same provider he used last year. He understands they have 48 hrs to make first visit. He communicates no further needs. He has all necessary DME for DC.   Action/Plan: Clarise Cruz, [facility] rep, made aware of referral and will obtain pt info from chart.   Expected Discharge Date:  09/14/16               Expected Discharge Plan:  [place]  In-House Referral:  NA  Discharge planning Services  CM Consult  Post Acute Care Choice:  Home Health Choice offered to:  Patient  HH Arranged:  PT [place]:  [place]  Status of Service:  Completed, signed off  Sherald Barge, RN 09/14/2016, 4:35 PM

## 2016-09-14 NOTE — Evaluation (Signed)
Physical Therapy Evaluation Patient Details Name: Charles [name] MRN: [id number] DOB: 1955-05-14 Today's Date: 09/14/2016   History of Present Illness  Zakariyah Freimark is a 61yo black male who comes to [facility] on 7/23 d/t LOC at home. Pt is deaf, signs using ASL, Hs of CVA in 2014 c chronic Lt spastic hemiplegia, 4 level cervical fusion 6MA. Workup this admission negative thus far for CVA,, negative EEG. 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 Pt reports chronic pain in Left low back, and lives with is mother, independent in ADL, and short distance AMB with quad cane, manual WC in house, which he cannot self propell well due to LUE limitations, and BUE propulsiona aggravates chronic low back pain. He relies on his mother for transportation.   Clinical Impression  Pt admitted with above diagnosis. Pt currently with functional limitations due to the deficits listed below (see "PT Problem List"). Upon entry, the patient is received semirecumbent in bed, no family/caregiver present. Evaluation is perform through use of live interpreter for ASL, Wyline Copas who is familiar with the patient. The pt is awake and agreeable to participate. Pt c/o 10/10 back pain that is worse than typical and reportedly not as responsive to pain meds here as at home. Functional mobility assessment demonstrates mild-moderate weakness, bed mobility, transfers, and gait are all performed at supervision level, requiring moderate more effort than baseline. All education completed, and time is given to address all questions/concerns. No additional skilled PT services needed at this time, PT signing off. PT recommends daily ambulation ad lib or with nursing staff as needed to prevent deconditioning. Recommending HHPT to address chronic pain, acute weakness, and improve strategies for pain free mobility.      Follow Up Recommendations Home health PT    Equipment Recommendations  Other (comment) (power WC)     Recommendations for Other Services       Precautions / Restrictions Precautions Precautions: None Restrictions Weight Bearing Restrictions: No      Mobility  Bed Mobility Overal bed mobility: Independent                Transfers Overall transfer level: Independent Equipment used: None;Straight cane                Ambulation/Gait Ambulation/Gait assistance: Supervision Ambulation Distance (Feet): 80 Feet Assistive device: Straight cane     Gait velocity interpretation: Below normal speed for age/gender General Gait Details: no lengthy amb pursued; pt reports feeling weaker than usual, but typically long distances aggravate hsi back pain   Stairs            Wheelchair Mobility    Modified Rankin (Stroke Patients Only)       Balance Overall balance assessment: History of Falls;Modified Independent (falls frequently)                                           Pertinent Vitals/Pain Pain Assessment: 0-10 Pain Score: 10-Worst pain ever Pain Location: Pt points to left SIJ  Pain Descriptors / Indicators: Aching Pain Intervention(s): Limited activity within patient's tolerance;Monitored during session;Premedicated before session (chronic problem, somewhat exacerbated)    Home Living Family/patient expects to be discharged to:: Private residence Living Arrangements: Parent Available Help at Discharge: Family;Available 24 hours/day Type of Home: House Home Access: Stairs to enter Entrance Stairs-Rails: Right Entrance Stairs-Number of Steps: 1 partial step  Home  Layout: One level Home Equipment: Cane - quad;Wheelchair - manual;Grab bars - tub/shower;Shower seat      Prior Function Level of Independence: Needs assistance   Gait / Transfers Assistance Needed: modI   ADL's / Homemaking Assistance Needed: indep in ADL, cannot prepare food easily for self and limtied to things that are unilaterally friendly, not requriing cutting.          Hand Dominance   Dominant Hand: Right    Extremity/Trunk Assessment   Upper Extremity Assessment Upper Extremity Assessment: LUE deficits/detail LUE Deficits / Details: chronic spastic hemiplegia    Lower Extremity Assessment Lower Extremity Assessment: LLE deficits/detail LLE Deficits / Details: chronic spastic hemiplegia, abducted and weight bearing       Communication   Communication: Deaf;Interpreter utilized [employer] with [employer] )  Cognition Arousal/Alertness: Awake/alert Behavior During Therapy: WFL for tasks assessed/performed Overall Cognitive Status: Within Functional Limits for tasks assessed                                        General Comments      Exercises     Assessment/Plan    PT Assessment All further PT needs can be met in the next venue of care;Patent does not need any further PT services  PT Problem List Decreased strength;Decreased activity tolerance       PT Treatment Interventions      PT Goals (Current goals can be found in the Care Plan section)  Acute Rehab PT Goals Patient Stated Goal: improve pain control  PT Goal Formulation: All assessment and education complete, DC therapy    Frequency     Barriers to discharge        Co-evaluation               AM-PAC PT "6 Clicks" Daily Activity  Outcome Measure Difficulty turning over in bed (including adjusting bedclothes, sheets and blankets)?: A Little Difficulty moving from lying on back to sitting on the side of the bed? : A Little Difficulty sitting down on and standing up from a chair with arms (e.g., wheelchair, bedside commode, etc,.)?: A Little Help needed moving to and from a bed to chair (including a wheelchair)?: A Little Help needed walking in hospital room?: A Lot Help needed climbing 3-5 steps with a railing? : Total 6 Click Score: 15    End of Session Equipment Utilized During Treatment: Gait belt Activity  Tolerance: Patient tolerated treatment well;Patient limited by pain Patient left: in chair Nurse Communication: Mobility status PT Visit Diagnosis: Difficulty in walking, not elsewhere classified (R26.2);History of falling (Z91.81)    Time: 7014-1030 PT Time Calculation (min) (ACUTE ONLY): 45 min   Charges:   PT Evaluation $PT Eval Moderate Complexity: 1 Procedure     PT G Codes:   PT G-Codes **NOT FOR INPATIENT CLASS** Functional Assessment Tool Used: AM-PAC 6 Clicks Basic Mobility;Clinical judgement Functional Limitation: Mobility: Walking and moving around Mobility: Walking and Moving Around Current Status (D3143): At least 40 percent but less than 60 percent impaired, limited or restricted Mobility: Walking and Moving Around Goal Status [phone]): At least 40 percent but less than 60 percent impaired, limited or restricted Mobility: Walking and Moving Around Discharge Status [phone]): At least 40 percent but less than 60 percent impaired, limited or restricted    4:09 PM, 09/14/16 Rosamaria Lints, PT, DPT Physical Therapist - [facility] [phone] (Office)    Michaelah Credeur C 09/14/2016, 4:05 PM

## 2016-09-14 NOTE — Progress Notes (Signed)
OT Cancellation Note  Patient Details Name: Charles [name] MRN: [id number] DOB: 1955/08/04   Cancelled Treatment:    Reason Eval/Treat Not Completed: OT screened, no needs identified, will sign off. Screened pt for OT needs, pt is at baseline with ADL completion, is able to complete ADLs independently and perform functional mobility with use of quad cane. No further OT needs at this time.   Guadelupe Sabin, OTR/L  [phone] 09/14/2016, 4:02 PM

## 2016-09-14 NOTE — Progress Notes (Signed)
PT Cancellation Note  Patient Details Name: NEKODA CHOCK MRN: [id number] DOB: [birth year]   Cancelled Treatment:    Reason Eval/Treat Not Completed: Other (comment) (PT placed call for ASL interpereter at 9AM, has not heard back. Second call placed at 13:00. Will attempt evaluation once this is coordinated. )  12:58 PM, 09/14/16 Etta Grandchild, PT, DPT Physical Therapist - [place] [phone] (Office)    Raijon Lindfors C 09/14/2016, 12:58 PM

## 2016-09-14 NOTE — Consult Note (Signed)
[place] A. Charles Laughter, MD     [email or web address]          Charles [name] is an 61 y.o. male.   ASSESSMENT/PLAN:  Episode of loss of consciousness of unclear etiology. As it is unclear that the spell is epileptic, I would not recommend increasing his Keppra for now.  Baseline history of epilepsy seemingly well controlled. Continue with Keppra 500 twice a day.  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 The patient is on dual antiplatelet agents and the does not need this long-term given increased risk of hemorrhage without additional benefit. We should discontinue all of these agents on discharge.  Multivessel intracranial occlusive disease: Continue with aspirin and statin.  Severe hearing impairment      The patient is 61 year old severely impaired black male who presents with episode of loss of consciousness. The history is obtained from speaking with the patient, obtaining a interpreter with sign language monitor and also comparing it to the admission note from the hospitalist. The story seems rather consistent. The patient tells [place] that he simply blacked out or lost consciousness for a few minutes. He denies tonoclonic activity. He apparently has a history of epilepsy and is on Keppra but reports not having any seizures since his stroke a few years ago. Patient has returned to baseline. He does not report any complaints at this time of the history is very difficult given the severe hearing impairment.  GENERAL: He is sitting up feeling himself breakfast.  HEENT: Severe hearing impairment which limits the evaluation.   ABDOMEN: soft  EXTREMITIES: No edema   BACK: Normal.  SKIN: Normal by inspection.    MENTAL STATUS: He is awake and alert. He does follow commands with much difficulty due to severe hearing impairment. Speech is also moderately impaired.  CRANIAL NERVES: Pupils are equal,  round and reactive to light and accommodation; extra ocular movements are full, there is no significant nystagmus; visual fields are full; upper and lower facial muscles are normal in strength and symmetric, there is no flattening of the nasolabial folds; tongue is midline; uvula is midline; shoulder elevation is normal.  MOTOR: He has a spastic left hemiparesis graded as 4 minus/5. The right side shows normal tone, bulk and strength.  COORDINATION: Left finger to nose is normal, right finger to nose is normal, No rest tremor; no intention tremor; no postural tremor; no bradykinesia.  REFLEXES: Deep tendon reflexes are slightly brisk on the left side but normal on the right.   SENSATION: Unreliable         Blood pressure (!) 163/89, pulse 63, temperature 97.7 F (36.5 C), temperature source Oral, resp. rate 18, height 5' 4" (1.626 m), weight 167 lb 3.2 oz (75.8 kg), SpO2 96 %.  Past Medical History:  Diagnosis Date  . Anemia   . Asthma   . Deaf   . Essential hypertension, benign   . GERD (gastroesophageal reflux disease)   . Headache    migraines in the past per pt  . History of stroke    Spastic hemiplegia, right MCA stroke April 2014 in [place]  . Mixed hyperlipidemia   . Other pancytopenia ([place]) 12/23/2015  . PAD (peripheral artery disease) ([place])   . Sarcoidosis    lungs  . Seizures ([place])   . Stroke [facility])    2014, left sided weakness  . Type 2 diabetes mellitus ([place])     Past Surgical History:  Procedure Laterality Date  . BRAVO PH STUDY N/A 06/18/2013   pH STUDY SHOWS NEXIUM TWICE DAILY CONTROLS THE ACID IN HIS STOMACH. HE HAD VERY FEWEPISODE OF REGURGITATION RECORDED IN THE 2 DAYS THE STUDY WAS PERFORMED.   [name] COLONOSCOPY  2011   [place]  . 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   . ESOPHAGOGASTRODUODENOSCOPY N/A 05/05/2015   Procedure: ESOPHAGOGASTRODUODENOSCOPY (EGD);  Surgeon: Danie Binder, MD;  [facility];  Service: Endoscopy;  Laterality: N/A;  730  . MALONEY DILATION N/A 06/18/2013   Procedure: MALONEY DILATION;  Surgeon: Danie Binder, MD;  Location: [facility];  Service: Endoscopy;  Laterality: N/A;  . OTHER SURGICAL HISTORY     cyst removal head and chest  . POSTERIOR CERVICAL FUSION/FORAMINOTOMY N/A 10/07/2015   Procedure: Cervical Three-Four, Cervical Four-Five, Cervical Five-Six, Cervical Six-Seven Posterior Cervical Laminectomy and Fusion;  Surgeon: Kary Kos, MD;  Location: [facility]URO ORS;  Service: Neurosurgery;  Laterality: N/A;  . SAVORY DILATION N/A 06/18/2013   Procedure: SAVORY DILATION;  Surgeon: Danie Binder, MD;  Location[facility];  Service: Endoscopy;  Laterality: N/A;  . TEE WITHOUT CARDIOVERSION N/A 10/04/2012   Procedure: TRANSESOPHAGEAL ECHOCARDIOGRAM (TEE);  Surgeon: Thayer Headings, MD;  Location: [facility] ENDOSCOPY;  Service: Cardiovascular;  Laterality: N/A;  . [place] accident  Both legs fractured and repaired surgically    Family History  Problem Relation Age of Onset  . Diabetes Mother   . Hypertension Mother   . Diabetes Father   . Hypertension Father   . Colon polyps Neg Hx   . Colon cancer Neg Hx     Social History:  reports that he quit smoking about 24 years ago. His smoking use included Cigarettes. He has a 29.00 pack-year smoking history. He has never used smokeless tobacco. He reports that he drinks about 0.6 - 1.2 oz of alcohol per week . He reports that he uses drugs, including Marijuana and "Crack" cocaine.  Allergies:  Allergies  Allergen Reactions  . Shellfish Allergy Hives and Swelling    SWELLING REACTION UNSPECIFIED   . Hydrocodone Other (See  Comments)    Makes "looney"  . Contrast Media [Iodinated Diagnostic Agents] Itching  . Oxycodone Nausea And Vomiting    Medications: Prior to Admission medications   Medication Sig Start Date End Date Taking? Authorizing Provider  albuterol (PROAIR HFA) 108 (90 Base) MCG/ACT inhaler Inhale 2 puffs into the lungs every 6 (six) hours as needed for wheezing or shortness of breath.   Yes [provider]  aspirin 325 MG EC tablet Take 325 mg by mouth daily.   Yes [provider]  atorvastatin (LIPITOR) 10 MG tablet Take 10 mg by mouth daily. (cholesterol)   Yes [provider]  cholecalciferol (VITAMIN D) 1000 units tablet Take 1,000 Units by mouth daily.   Yes [provider]  clopidogrel (PLAVIX) 75 MG tablet Take 75 mg by mouth daily.   Yes [provider]  escitalopram (LEXAPRO) 5 MG tablet Take 5 mg by mouth daily.    Yes [provider]  esomeprazole (NEXIUM) 40 MG capsule Take 1 capsule (40 mg total) by mouth 2 (two) times daily. Patient  taking differently: Take 40 mg by mouth daily.  04/17/15  Yes Carmin Muskrat, MD  glucose blood [facility] VERIO) test strip 1 each by Other route daily. And lancets 1/day 06/03/16  Yes Renato Shin, MD  hydrochlorothiazide (MICROZIDE) 12.5 MG capsule Take 12.5 mg by mouth daily.   Yes [provider]  losartan (COZAAR) 50 MG tablet Take 50 mg by mouth daily.   Yes [provider]  Melatonin 5 MG TABS Take 5 mg by mouth at bedtime.   Yes [provider]  mometasone-formoterol (DULERA) 100-5 MCG/ACT AERO Inhale 2 puffs into the lungs every 12 (twelve) hours. 02/09/16  Yes Tanda Rockers, MD  naproxen (NAPROSYN) 375 MG tablet Take 375 mg by mouth 2 (two) times daily with a meal.   Yes [provider]  predniSONE (DELTASONE) 1 MG tablet Take 1 mg by mouth daily with breakfast.   Yes [provider]  sitaGLIPtin (JANUVIA) 100 MG tablet Take 100 mg by mouth daily.    Yes [provider]  tiotropium (SPIRIVA HANDIHALER) 18 MCG inhalation capsule Place 18 mcg into inhaler and inhale daily.    Yes [provider]  traMADol (ULTRAM) 50 MG tablet Take 1 tablet (50 mg total) by mouth every 8 (eight) hours as needed. Patient taking differently: Take 50 mg by mouth every 8 (eight) hours as needed (pain).  08/12/15  Yes Marcial Pacas, MD  DULoxetine (CYMBALTA) 60 MG capsule Take 1 capsule (60 mg total) by mouth daily. Patient not taking: Reported on 09/13/2016 03/08/16   Marcial Pacas, MD  gabapentin (NEURONTIN) 300 MG capsule Take 1 capsule (300 mg total) by mouth 3 (three) times daily. Patient not taking: Reported on 09/13/2016 12/07/15   Marcial Pacas, MD  levETIRAcetam (KEPPRA) 500 MG tablet Take 1 tablet (500 mg total) by mouth 2 (two) times daily. 05/23/16   Rolland Porter, MD  oxybutynin (DITROPAN XL) 15 MG 24 hr tablet Take 15 mg by mouth at bedtime.    [provider]  tiZANidine (ZANAFLEX) 2 MG tablet Take 1 tablet (2 mg total) by mouth 3 (three) times daily. Patient not taking: Reported on 09/13/2016 03/08/16   Marcial Pacas, MD    Scheduled Meds: . aspirin  325 mg Oral Daily  . atorvastatin  10 mg Oral q1800  . citalopram  10 mg Oral Daily  . clopidogrel  75 mg Oral Daily  . enoxaparin (LOVENOX) injection  40 mg Subcutaneous Q24H  . gabapentin  300 mg Oral TID  . insulin aspart  0-9 Units Subcutaneous TID WC  . levETIRAcetam  500 mg Oral BID  . mometasone-formoterol  2 puff Inhalation Q12H  . oxybutynin  15 mg Oral QHS  . pantoprazole  80 mg Oral Daily  . predniSONE  1 mg Oral Q breakfast  . tiotropium  18 mcg Inhalation Daily  . tiZANidine  2 mg Oral TID   Continuous Infusions: . sodium chloride     PRN Meds:.acetaminophen **OR** acetaminophen (TYLENOL) oral liquid 160 mg/5 mL **OR** acetaminophen, albuterol, LORazepam, senna-docusate     Results for orders placed or performed during the hospital encounter of 09/12/16 (from the past  48 hour(s))  Protime-INR     Status: None   Collection Time: 09/12/16  2:28 PM  Result Value Ref Range   Prothrombin Time 12.3 11.4 - 15.2 seconds   INR 0.92   APTT     Status: None   Collection Time: 09/12/16  2:28 PM  Result Value Ref Range  aPTT 28 24 - 36 seconds  CBC     Status: Abnormal   Collection Time: 09/12/16  2:28 PM  Result Value Ref Range   WBC 6.5 4.0 - 10.5 K/uL   RBC 4.82 4.22 - 5.81 MIL/uL   Hemoglobin 14.8 13.0 - 17.0 g/dL   HCT 42.3 39.0 - 52.0 %   MCV 87.8 78.0 - 100.0 fL   MCH 30.7 26.0 - 34.0 pg   MCHC 35.0 30.0 - 36.0 g/dL   RDW 13.6 11.5 - 15.5 %   Platelets 117 (L) 150 - 400 K/uL    Comment: PLATELET COUNT CONFIRMED BY SMEAR SPECIMEN CHECKED FOR CLOTS   Differential     Status: None   Collection Time: 09/12/16  2:28 PM  Result Value Ref Range   Neutrophils Relative % 56 %   Neutro Abs 3.7 1.7 - 7.7 K/uL   Lymphocytes Relative 35 %   Lymphs Abs 2.3 0.7 - 4.0 K/uL   Monocytes Relative 8 %   Monocytes Absolute 0.5 0.1 - 1.0 K/uL   Eosinophils Relative 1 %   Eosinophils Absolute 0.1 0.0 - 0.7 K/uL   Basophils Relative 0 %   Basophils Absolute 0.0 0.0 - 0.1 K/uL  Comprehensive metabolic panel     Status: Abnormal   Collection Time: 09/12/16  2:28 PM  Result Value Ref Range   Sodium 136 135 - 145 mmol/L   Potassium 3.8 3.5 - 5.1 mmol/L   Chloride 97 (L) 101 - 111 mmol/L   CO2 31 22 - 32 mmol/L   Glucose, Bld 74 65 - 99 mg/dL   BUN 15 6 - 20 mg/dL   Creatinine, Ser 1.32 (H) 0.61 - 1.24 mg/dL   Calcium 8.8 (L) 8.9 - 10.3 mg/dL   Total Protein 7.2 6.5 - 8.1 g/dL   Albumin 4.2 3.5 - 5.0 g/dL   AST 27 15 - 41 U/L   ALT 25 17 - 63 U/L   Alkaline Phosphatase 60 38 - 126 U/L   Total Bilirubin 0.6 0.3 - 1.2 mg/dL   GFR calc non Af Amer 56 (L) >60 mL/min   GFR calc Af Amer >60 >60 mL/min    Comment: (NOTE) The eGFR has been calculated using the CKD EPI equation. This calculation has not been validated in all clinical situations. eGFR's  persistently <60 mL/min signify possible Chronic Kidney Disease.    Anion gap 8 5 - 15  Hemoglobin A1c     Status: Abnormal   Collection Time: 09/12/16  2:28 PM  Result Value Ref Range   Hgb A1c MFr Bld 5.8 (H) 4.8 - 5.6 %    Comment: (NOTE)         Pre-diabetes: 5.7 - 6.4         Diabetes: >6.4         Glycemic control for adults with diabetes: <7.0    Mean Plasma Glucose 120 mg/dL    Comment: (NOTE) Performed At: [facility] [address] [place], [place] [id number] Lindon Romp MD NW:[id number]   Ethanol     Status: None   Collection Time: 09/12/16  2:50 PM  Result Value Ref Range   Alcohol, Ethyl (B) <5 <5 mg/dL    Comment:        LOWEST DETECTABLE LIMIT FOR SERUM ALCOHOL IS 5 mg/dL FOR MEDICAL PURPOSES ONLY   Urine rapid drug screen (hosp performed)not at [facility]     Status: None   Collection Time: 09/12/16  2:50  PM  Result Value Ref Range   Opiates NONE DETECTED NONE DETECTED   Cocaine NONE DETECTED NONE DETECTED   Benzodiazepines NONE DETECTED NONE DETECTED   Amphetamines NONE DETECTED NONE DETECTED   Tetrahydrocannabinol NONE DETECTED NONE DETECTED   Barbiturates NONE DETECTED NONE DETECTED    Comment:        DRUG SCREEN FOR MEDICAL PURPOSES ONLY.  IF CONFIRMATION IS NEEDED FOR ANY PURPOSE, NOTIFY LAB WITHIN 5 DAYS.        LOWEST DETECTABLE LIMITS FOR URINE DRUG SCREEN Drug Class       Cutoff (ng/mL) Amphetamine      1000 Barbiturate      200 Benzodiazepine   488 Tricyclics       891 Opiates          300 Cocaine          300 THC              50   Urinalysis, Routine w reflex microscopic     Status: Abnormal   Collection Time: 09/12/16  2:50 PM  Result Value Ref Range   Color, Urine STRAW (A) YELLOW   APPearance CLEAR CLEAR   Specific Gravity, Urine 1.005 1.005 - 1.030   pH 7.0 5.0 - 8.0   Glucose, UA 50 (A) NEGATIVE mg/dL   Hgb urine dipstick NEGATIVE NEGATIVE   Bilirubin Urine NEGATIVE NEGATIVE   Ketones, ur NEGATIVE NEGATIVE mg/dL     Protein, ur NEGATIVE NEGATIVE mg/dL   Nitrite NEGATIVE NEGATIVE   Leukocytes, UA NEGATIVE NEGATIVE  I-stat troponin, ED (not at [facility], [facility])     Status: None   Collection Time: 09/12/16  2:51 PM  Result Value Ref Range   Troponin i, poc 0.00 0.00 - 0.08 ng/mL   Comment 3            Comment: Due to the release kinetics of cTnI, a negative result within the first hours of the onset of symptoms does not rule out myocardial infarction with certainty. If myocardial infarction is still suspected, repeat the test at appropriate intervals.   I-Stat Chem 8, ED  (not at [facility], [facility])     Status: Abnormal   Collection Time: 09/12/16  2:52 PM  Result Value Ref Range   Sodium 139 135 - 145 mmol/L   Potassium 3.9 3.5 - 5.1 mmol/L   Chloride 96 (L) 101 - 111 mmol/L   BUN 15 6 - 20 mg/dL   Creatinine, Ser 1.30 (H) 0.61 - 1.24 mg/dL   Glucose, Bld 73 65 - 99 mg/dL   Calcium, Ion 1.10 (L) 1.15 - 1.40 mmol/L   TCO2 30 0 - 100 mmol/L   Hemoglobin 15.3 13.0 - 17.0 g/dL   HCT 45.0 39.0 - 52.0 %  CBG monitoring, ED     Status: Abnormal   Collection Time: 09/12/16  2:58 PM  Result Value Ref Range   Glucose-Capillary 61 (L) 65 - 99 mg/dL  CBG monitoring, ED     Status: Abnormal   Collection Time: 09/12/16  3:35 PM  Result Value Ref Range   Glucose-Capillary 108 (H) 65 - 99 mg/dL  CBG monitoring, ED     Status: None   Collection Time: 09/12/16  4:08 PM  Result Value Ref Range   Glucose-Capillary 98 65 - 99 mg/dL  CBG monitoring, ED     Status: None   Collection Time: 09/12/16  5:47 PM  Result Value Ref Range   Glucose-Capillary 85 65 - 99  mg/dL  HIV antibody (Routine Testing)     Status: None   Collection Time: 09/12/16  8:24 PM  Result Value Ref Range   HIV Screen 4th Generation wRfx Non Reactive Non Reactive    Comment: (NOTE) Performed At: [facility] [address] [place], [place] [id number] Lindon Romp MD QP:[id number]   Troponin I     Status: None   Collection Time:  09/12/16  8:24 PM  Result Value Ref Range   Troponin I <0.03 <0.03 ng/mL  TSH     Status: None   Collection Time: 09/12/16  8:24 PM  Result Value Ref Range   TSH 2.679 0.350 - 4.500 uIU/mL    Comment: Performed by a 3rd Generation assay with a functional sensitivity of <=0.01 uIU/mL.  Vitamin B12     Status: None   Collection Time: 09/12/16  8:24 PM  Result Value Ref Range   Vitamin B-12 405 180 - 914 pg/mL    Comment: (NOTE) This assay is not validated for testing neonatal or myeloproliferative syndrome specimens for Vitamin B12 levels. Performed at [facility], [place] [address] [place], [place] [address]   Glucose, capillary     Status: None   Collection Time: 09/12/16  9:05 PM  Result Value Ref Range   Glucose-Capillary 87 65 - 99 mg/dL   Comment 1 Notify RN    Comment 2 Document in Chart   Troponin I     Status: None   Collection Time: 09/13/16  2:02 AM  Result Value Ref Range   Troponin I <0.03 <0.03 ng/mL  Lipid panel     Status: Abnormal   Collection Time: 09/13/16  2:02 AM  Result Value Ref Range   Cholesterol 160 0 - 200 mg/dL   Triglycerides 288 (H) <150 mg/dL   HDL 59 >40 mg/dL   Total CHOL/HDL Ratio 2.7 RATIO   VLDL 58 (H) 0 - 40 mg/dL   LDL Cholesterol 43 0 - 99 mg/dL    Comment:        Total Cholesterol/HDL:CHD Risk Coronary Heart Disease Risk Table                     Men   Women  1/2 Average Risk   3.4   3.3  Average Risk       5.0   4.4  2 X Average Risk   9.6   7.1  3 X Average Risk  23.4   11.0        Use the calculated Patient Ratio above and the CHD Risk Table to determine the patient's CHD Risk.        ATP III CLASSIFICATION (LDL):  <100     mg/dL   Optimal  100-129  mg/dL   Near or Above                    Optimal  130-159  mg/dL   Borderline  160-189  mg/dL   High  >190     mg/dL   Very High   Troponin I     Status: None   Collection Time: 09/13/16  7:43 AM  Result Value Ref Range   Troponin I <0.03 <0.03 ng/mL  CK      Status: None   Collection Time: 09/13/16  7:43 AM  Result Value Ref Range   Total CK 121 49 - 397 U/L  Basic metabolic panel     Status: Abnormal  Collection Time: 09/13/16  7:43 AM  Result Value Ref Range   Sodium 137 135 - 145 mmol/L   Potassium 3.8 3.5 - 5.1 mmol/L   Chloride 100 (L) 101 - 111 mmol/L   CO2 30 22 - 32 mmol/L   Glucose, Bld 117 (H) 65 - 99 mg/dL   BUN 13 6 - 20 mg/dL   Creatinine, Ser 1.08 0.61 - 1.24 mg/dL   Calcium 8.5 (L) 8.9 - 10.3 mg/dL   GFR calc non Af Amer >60 >60 mL/min   GFR calc Af Amer >60 >60 mL/min    Comment: (NOTE) The eGFR has been calculated using the CKD EPI equation. This calculation has not been validated in all clinical situations. eGFR's persistently <60 mL/min signify possible Chronic Kidney Disease.    Anion gap 7 5 - 15  CBC     Status: Abnormal   Collection Time: 09/13/16  7:43 AM  Result Value Ref Range   WBC 4.3 4.0 - 10.5 K/uL   RBC 4.47 4.22 - 5.81 MIL/uL   Hemoglobin 13.6 13.0 - 17.0 g/dL   HCT 39.2 39.0 - 52.0 %   MCV 87.7 78.0 - 100.0 fL   MCH 30.4 26.0 - 34.0 pg   MCHC 34.7 30.0 - 36.0 g/dL   RDW 13.4 11.5 - 15.5 %   Platelets 106 (L) 150 - 400 K/uL    Comment: PLATELET COUNT CONFIRMED BY SMEAR SPECIMEN CHECKED FOR CLOTS   Magnesium     Status: None   Collection Time: 09/13/16  7:43 AM  Result Value Ref Range   Magnesium 1.8 1.7 - 2.4 mg/dL  Glucose, capillary     Status: Abnormal   Collection Time: 09/13/16  9:34 AM  Result Value Ref Range   Glucose-Capillary 128 (H) 65 - 99 mg/dL   Comment 1 Notify RN    Comment 2 Document in Chart   Glucose, capillary     Status: Abnormal   Collection Time: 09/13/16 11:17 AM  Result Value Ref Range   Glucose-Capillary 197 (H) 65 - 99 mg/dL   Comment 1 Notify RN    Comment 2 Document in Chart   Glucose, capillary     Status: None   Collection Time: 09/13/16  4:20 PM  Result Value Ref Range   Glucose-Capillary 89 65 - 99 mg/dL   Comment 1 Document in Chart   Glucose,  capillary     Status: None   Collection Time: 09/14/16  7:26 AM  Result Value Ref Range   Glucose-Capillary 94 65 - 99 mg/dL   Comment 1 Notify RN    Comment 2 Document in Chart     Studies/Results:  BRAIN  MRI MRA MRA HEAD FINDINGS  Both internal carotid arteries are widely patent into the brain. Left anterior and middle cerebral arteries are patent. There appears to be 50% stenosis at the superior division left M2 branch origin. This could place the patient at risk of MCA infarction. There may also be a stenosis at the A1 origin on the left. On the right, the internal carotid artery is patent through the skullbase. There is middle cerebral artery occlusion at the origin. There is stenosis of the A1 segment which appears severe. There are large posterior communicating arteries bilaterally.  Right vertebral artery is a tiny vessel which shows antegrade flow to PICA but not beyond. No antegrade flow seen in the left vertebral. No flow seen in the proximal basilar. Posterior communicating arteries give supply to both posterior  cerebral arteries and both superior cerebellar arteries. A  IMPRESSION: No acute finding by MRI. Chronic infarction throughout the right middle cerebral artery territory with atrophy, encephalomalacia and gliosis. Chronic small-vessel ischemic changes diffusely.  Right MCA occlusion, presumably chronic. Left vertebral occlusion. Right vertebral occlusion distal to PICA. No flow seen in the proximal basilar artery.  Severe stenosis of the A1 segment on the right. Potentially significant stenoses of the A1 and M2 vessels on the left. Large posterior communicating arteries give supply to the distal posterior circulation.     CAROTID DOPPLERS normal   Trula Frede A. Charles [name], M.D.  Diplomate, Tax adviser of Psychiatry and Neurology ( Neurology). 09/14/2016, 8:24 AM

## 2016-09-14 NOTE — Progress Notes (Addendum)
Discharge instructions read to patient and signed language  by interpreter Danny [id number]. All questions answered. Pt has family present for the instructions discharged home with family.

## 2016-09-16 NOTE — Progress Notes (Signed)
CODE STROKE NOTES FROM 09/12/2016 1438 CALL 1447 EXAM STARTED 1448 EXAM FINISHED  1448 IMAGES SENT TO SOC 1455 EXAM COMPLETED 1456 [facility] CALLED

## 2016-09-20 ENCOUNTER — Ambulatory Visit (INDEPENDENT_AMBULATORY_CARE_PROVIDER_SITE_OTHER): Payer: Medicare Other | Admitting: Podiatry

## 2016-09-20 ENCOUNTER — Encounter: Payer: Self-pay | Admitting: Podiatry

## 2016-09-20 DIAGNOSIS — B351 Tinea unguium: Secondary | ICD-10-CM | POA: Diagnosis not present

## 2016-09-20 DIAGNOSIS — M79609 Pain in unspecified limb: Secondary | ICD-10-CM

## 2016-09-20 NOTE — Progress Notes (Signed)
Patient ID: Charles [name], male   DOB: [birth year], 61 y.o.   MRN: [id number] Complaint:  Visit Type: Patient returns to my office for continued preventative foot care services. Complaint: Patient states" my nails have grown long and thick and become painful to walk and wear shoes" Patient has been diagnosed with DM with no foot complications. The patient presents for preventative foot care services. No changes to ROS  Podiatric Exam: Vascular: dorsalis pedis and posterior tibial pulses are palpable bilateral. Capillary return is immediate. Temperature gradient is WNL. Skin turgor WNL  Sensorium: Normal Semmes Weinstein monofilament test. Normal tactile sensation bilaterally. Nail Exam: Pt has thick disfigured discolored nails with subungual debris noted bilateral entire nail hallux  Ulcer Exam: There is no evidence of ulcer or pre-ulcerative changes or infection. Orthopedic Exam: Muscle tone and strength are WNL. No limitations in general ROM. No crepitus or effusions noted. Foot type and digits show no abnormalities. Bony prominences are unremarkable. Skin: No Porokeratosis. No infection or ulcers  Diagnosis:  Onychomycosis, , Pain in right toe, pain in left toes  Treatment & Plan Procedures and Treatment: Consent by patient was obtained for treatment procedures. The patient understood the discussion of treatment and procedures well. All questions were answered thoroughly reviewed. Debridement of mycotic and hypertrophic toenails, 1 through 5 bilateral and clearing of subungual debris. No ulceration, no infection noted. Patient asked about diabetic shoes but he does not qualify. Return Visit-Office Procedure: Patient instructed to return to the office for a follow up visit 3 months for continued evaluation and treatment.   Gardiner Barefoot DPM

## 2016-09-29 ENCOUNTER — Telehealth: Payer: Self-pay | Admitting: Neurology

## 2016-09-29 NOTE — Telephone Encounter (Signed)
I called pt brother and then mother or pt.  Im trying to see what medications he is taking relating to his back, neck, head pain.  He is deaf, and mother will call me today before five to tomorrow.

## 2016-09-29 NOTE — Telephone Encounter (Signed)
Patients brother [name] (listed on DPR) called office in reference to patient having increased left sided low back pain.  He states the patient can barely walk.  Pharmacy- [facility].  Please call

## 2016-09-30 NOTE — Telephone Encounter (Signed)
I called and no answer at home #.  Had not heard from mother or pt.

## 2016-10-04 NOTE — Telephone Encounter (Signed)
Called to speak to mother of pt (her home) t to follow up on last message (what meds pt is taking)?   I could not LM, VM full.

## 2016-10-11 DIAGNOSIS — M5440 Lumbago with sciatica, unspecified side: Secondary | ICD-10-CM | POA: Diagnosis not present

## 2016-10-12 ENCOUNTER — Other Ambulatory Visit: Payer: Self-pay | Admitting: Neurosurgery

## 2016-10-12 DIAGNOSIS — M5440 Lumbago with sciatica, unspecified side: Secondary | ICD-10-CM

## 2016-10-17 DIAGNOSIS — Z79899 Other long term (current) drug therapy: Secondary | ICD-10-CM | POA: Diagnosis not present

## 2016-10-17 DIAGNOSIS — Z8673 Personal history of transient ischemic attack (TIA), and cerebral infarction without residual deficits: Secondary | ICD-10-CM | POA: Diagnosis not present

## 2016-10-17 DIAGNOSIS — R1084 Generalized abdominal pain: Secondary | ICD-10-CM | POA: Diagnosis not present

## 2016-10-17 DIAGNOSIS — G4489 Other headache syndrome: Secondary | ICD-10-CM | POA: Diagnosis not present

## 2016-10-17 DIAGNOSIS — E119 Type 2 diabetes mellitus without complications: Secondary | ICD-10-CM | POA: Diagnosis not present

## 2016-10-17 DIAGNOSIS — Z87891 Personal history of nicotine dependence: Secondary | ICD-10-CM | POA: Diagnosis not present

## 2016-10-17 DIAGNOSIS — I10 Essential (primary) hypertension: Secondary | ICD-10-CM | POA: Diagnosis not present

## 2016-10-17 DIAGNOSIS — E11649 Type 2 diabetes mellitus with hypoglycemia without coma: Secondary | ICD-10-CM | POA: Diagnosis not present

## 2016-10-17 DIAGNOSIS — Z7952 Long term (current) use of systemic steroids: Secondary | ICD-10-CM | POA: Diagnosis not present

## 2016-10-17 DIAGNOSIS — R51 Headache: Secondary | ICD-10-CM | POA: Diagnosis not present

## 2016-10-27 ENCOUNTER — Ambulatory Visit
Admission: RE | Admit: 2016-10-27 | Discharge: 2016-10-27 | Disposition: A | Discharge status: Home / Self Care | Payer: Medicare Other | Source: Ambulatory Visit | Attending: Neurosurgery | Admitting: Neurosurgery

## 2016-10-27 DIAGNOSIS — M5440 Lumbago with sciatica, unspecified side: Secondary | ICD-10-CM

## 2016-10-27 DIAGNOSIS — M5126 Other intervertebral disc displacement, lumbar region: Secondary | ICD-10-CM | POA: Diagnosis not present

## 2016-11-02 ENCOUNTER — Encounter: Payer: Self-pay | Admitting: Neurology

## 2016-11-02 ENCOUNTER — Ambulatory Visit (INDEPENDENT_AMBULATORY_CARE_PROVIDER_SITE_OTHER): Payer: Medicare Other | Admitting: Neurology

## 2016-11-02 VITALS — BP 130/82 | Ht 64.0 in | Wt 167.0 lb

## 2016-11-02 DIAGNOSIS — G8114 Spastic hemiplegia affecting left nondominant side: Secondary | ICD-10-CM

## 2016-11-02 DIAGNOSIS — I63311 Cerebral infarction due to thrombosis of right middle cerebral artery: Secondary | ICD-10-CM | POA: Diagnosis not present

## 2016-11-02 MED ORDER — INCOBOTULINUMTOXINA 100 UNITS IM SOLR
500.0000 [IU] | INTRAMUSCULAR | Status: DC
  Administered 2016-11-02: 500 [IU] via INTRAMUSCULAR

## 2016-11-02 NOTE — Progress Notes (Signed)
**  [name] 100 units x 5 vials, NDC 7847-8412-82, Lot [id number], Exp 10/2018, office supply.//mck,rn**

## 2016-11-02 NOTE — Progress Notes (Signed)
Chief Complaint  Patient presents with  . Spastic Hemiplegia    [name] 100 units x 5 vials - office supply      PATIENT: Charles [name] DOB: 07/27/1955  HISTORICAL  CAMRYN EISENREICH is a 61 years old right-handed male, accompanied by his mother, and his brother, interpreter, for EMG guided Botox injection for his spastic left hemiparesis,I saw him first in Nov 2015, he was previously patients of Dr. Leonie Man, and Dr. Janann Colonel, who has performed EMG guided Botox injection in January, April, July 2015, for his spastic left hemiparesis, which he responded very well  He was born deaf, He had a history of pulmonary sarcoidosis, was treated with long-term low-dose steroid, also with past medical history of hypertension, diabetes, hyperlipidemia,  He suffered stroke in April 2014, was treated at [place], with residual severe left hemiparesis, per record  MRI of the brain showed remote right hemispheric infarct and small vessel disease type changes  MRA of brain abrupt  cutoff of flow at the right carotid terminus with nonvisualization of right middle cerebral artery branch vessels consistent with the patient's remote right hemispheric infarct. Fetal type origin of the posterior cerebral arteries with posterior cerebral artery supplied predominately from the anterior circulation. 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   2D Echocardiogram 60%, wall motion normal, LA normal size Carotid Doppler Bilateral: 1-39% ICA stenosis. Vertebral artery flow is antegrade  TEE no PFO, Pulm AVM  EEG in August 2014, normal  awake and asleep EEG. No focal or generalized epileptiform discharges noted   UPDATE May 8th 2017: He is with his mother and brother at today's clinical visit, last visit was November 25th 2015 for EMG guided Botox injection to his spastic left upper and lower extremity, he denies significant improvement with Botox injection.  Today he came in with a new  issue, complains a year history of intermittent right elbow discomfort, radiating pain to right arm, right hand, mainly involving right fifths, and fourth fingers, it happened with prolonged sitting, eating, or sleeping, he also noticed mild right hand weakness, difficulty to unscrew the bottle top. 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 He complains progressive worsening gait abnormality, falling few times, neck pain, low back pain, radiating pain to his right leg  UPDATE August 24 2015: He is accompanied by his mother brother and interpreter at today's clinical visit, he continue complains of low back pain radiating pain to right lower extremity, intermittent right arm hands paresthesia, worsening gait difficulty, urinary urgency,  We have personally reviewed MRI of cervical spine in June 2017: There is severe spinal stenosis at C3-C4 and at C4-C5 due to central disc protrusions/herniations, uncovertebral spurring, facet hypertrophy and congenitally short pedicles. There is subtle hyperintense signal within the spinal cord on sagittal STIR images just below the C3-C4 interspace but this is not confirmed on axial images. This could represent a mild cervical myelopathy. 2. There is moderate spinal stenosis at C5-C6 and C6-C7 mild spinal stenosis at C2-C3 to degenerative changes and congenitally short pedicles. 3. At C3-C4 there is moderately severe bilateral foraminal narrowing at could lead to impingement either of the C4 nerve roots. There does not appear to be nerve root impingement  at the other cervical levels  MRI of lumbar spine in June 2017: At L4-L5, there is broad disc bulging, moderately severe facet  hypertrophy, right greater than left, and minimal anterolisthesis. There is no nerve root compression at this level. When compared to the MRI dated 12/24/2012, the synovial cyst that was noted to the left on the prior MRI is no longer present. This relieves the left L5 nerve root impingement that was noted at that time. However, since the prior MRI there has been mild progression of the facet hypertrophy and minimal anterolisthesis . 2. There are milder degenerative changes at L3-L4 and L5-S1 that did not lead to any nerve root impingement, unchanged when compared to the previous MRI.  UPDATE Dec 03 2015: He had cervical decompression laminectomy C3-4-5 6 with foraminotomies of the C4-5 VI nerve roots by Dr. Saintclair Halsted on October 07 2015, patient reported significant improvement in his neck pain, family noticed he has difficulty sleeping, agitated.  I personally reviewed MRI cervical spine in September 2017: Posterior hardware fusion C3 through C7. Posterior decompression C3 through C5. Small posterior fluid collection in the soft tissues may represent postop fluid versus CSF leak. Small area of cord hyperintensity at the C4 level is unchanged compatible with chronic myelomalacia. No cord compression.  He complains of low back pain, left that time pain, had personally reviewed MRI of left femur there was no significant abnormality notice, MRI lumbar in June 2017, multilevel degenerative changes, but there was no significant canal or foraminal stenosis   UPDATE Jan 16th 2018: He complains of neck pain, using heating pad, stiff, limited range of motion, lean back to get relief, difficulty to get a comfortable position to sleep, he lost drip of his right hand,  Left leg pain when bearing weight, left leg tightness,  We have personally reviewed MRI of left leg showed no significant abnormality, MRI of lumbar in June 2017 showed mild degenerative disc disease but no significant canal or foraminal  stenosis CAT scan of the brain in August 2017 showed large right MCA stroke  Update Jul 05 2016: He complains of worsening left-sided low back pain, unsteady gait, we have personally reviewed MRI of lumbar in June 2017, only mild degenerative changes, there is no significant canal or foraminal stenosis.  He is taking Mobic/naproxen as needed, worsening spastic left hemiparesis, left hands in position   UPDATE June 5th 2018: He returned for a electronic stimulation guided xeomin injection for spastic left hemiparesis, was emphasize on left upper extremity today,  REVIEW OF SYSTEMS: Full 14 system review of systems performed and notable only for as above ALLERGIES: Allergies  Allergen Reactions  . Shellfish Allergy Hives and Swelling    SWELLING REACTION UNSPECIFIED   . Hydrocodone Other (See Comments)    Makes "looney"  . Contrast Media [Iodinated Diagnostic Agents] Itching  . Oxycodone Nausea And Vomiting    HOME MEDICATIONS: Current Outpatient Prescriptions on File Prior to Visit  Medication Sig Dispense Refill  . albuterol (PROAIR HFA) 108 (90 Base) MCG/ACT inhaler Inhale 2 puffs into the lungs every 6 (six) hours as needed for wheezing or shortness of breath.    [name] aspirin 325 MG EC tablet Take 325 mg by mouth daily.    [name] atorvastatin (LIPITOR) 10 MG tablet Take 10 mg by mouth daily. (cholesterol)    . cholecalciferol (VITAMIN D) 1000 units tablet Take 1,000 Units by mouth daily.    [name] escitalopram (LEXAPRO) 5 MG tablet Take 5 mg by mouth  daily.     . esomeprazole (NEXIUM) 40 MG capsule Take 1 capsule (40 mg total) by mouth 2 (two) times daily. (Patient taking differently: Take 40 mg by mouth daily. ) 60 capsule 0  . gabapentin (NEURONTIN) 300 MG capsule Take 1 capsule (300 mg total) by mouth 3 (three) times daily. 90 capsule 11  . glucose blood (ONETOUCH VERIO) test strip 1 each by Other route daily. And lancets 1/day 100 each 3  . hydrochlorothiazide (MICROZIDE) 12.5 MG capsule  Take 12.5 mg by mouth daily.    [name] levETIRAcetam (KEPPRA) 500 MG tablet Take 1 tablet (500 mg total) by mouth 2 (two) times daily. 60 tablet 0  . losartan (COZAAR) 50 MG tablet Take 50 mg by mouth daily.    . Melatonin 5 MG TABS Take 5 mg by mouth at bedtime.    . mometasone-formoterol (DULERA) 100-5 MCG/ACT AERO Inhale 2 puffs into the lungs every 12 (twelve) hours. 1 Inhaler 11  . naproxen (NAPROSYN) 375 MG tablet Take 375 mg by mouth 2 (two) times daily with a meal.    . oxybutynin (DITROPAN XL) 15 MG 24 hr tablet Take 15 mg by mouth at bedtime.    . predniSONE (DELTASONE) 1 MG tablet Take 1 mg by mouth daily with breakfast.    . tiotropium (SPIRIVA HANDIHALER) 18 MCG inhalation capsule Place 18 mcg into inhaler and inhale daily.     [name] tiZANidine (ZANAFLEX) 2 MG tablet Take 1 tablet (2 mg total) by mouth 3 (three) times daily. 60 tablet 6   Current Facility-Administered Medications on File Prior to Visit  Medication Dose Route Frequency Provider Last Rate Last Dose  . incobotulinumtoxinA ([name]) 100 units injection 500 Units  500 Units Intramuscular Q90 days Marcial Pacas, MD   500 Units at 07/26/16 1410    PAST MEDICAL HISTORY: Past Medical History:  Diagnosis Date  . Anemia   . Asthma   . Deaf   . Essential hypertension, benign   . GERD (gastroesophageal reflux disease)   . Headache    migraines in the past per pt  . History of stroke    Spastic hemiplegia, right MCA stroke April 2014 in [place]  . Mixed hyperlipidemia   . Other pancytopenia ([place]) 12/23/2015  . PAD (peripheral artery disease) ([place])   . Sarcoidosis    lungs  . Seizures ([place])   . Stroke [facility])    2014, left sided weakness  . Type 2 diabetes mellitus ([place])     PAST SURGICAL HISTORY: Past Surgical History:  Procedure Laterality Date  . BRAVO PH STUDY N/A 06/18/2013   pH STUDY SHOWS NEXIUM TWICE DAILY CONTROLS THE ACID IN HIS STOMACH. HE HAD VERY FEWEPISODE OF REGURGITATION RECORDED IN THE 2 DAYS THE STUDY  WAS PERFORMED.   [name] COLONOSCOPY  2011   IN [place]  . 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   . ESOPHAGOGASTRODUODENOSCOPY N/A 05/05/2015   Procedure: ESOPHAGOGASTRODUODENOSCOPY (EGD);  Surgeon: Danie Binder, MD;  Location: [facility];  Service: Endoscopy;  Laterality: N/A;  730  . MALONEY DILATION N/A 06/18/2013   Procedure: MALONEY DILATION;  Surgeon: Danie Binder, MD;  Location: [facility];  Service: Endoscopy;  Laterality: N/A;  . OTHER SURGICAL HISTORY  cyst removal head and chest  . POSTERIOR CERVICAL FUSION/FORAMINOTOMY N/A 10/07/2015   Procedure: Cervical Three-Four, Cervical Four-Five, Cervical Five-Six, Cervical Six-Seven Posterior Cervical Laminectomy and Fusion;  Surgeon: Kary Kos, MD;  Location: [facility];  Service: Neurosurgery;  Laterality: N/A;  . SAVORY DILATION N/A 06/18/2013   Procedure: SAVORY DILATION;  Surgeon: Danie Binder, MD;  Location: [facility];  Service: Endoscopy;  Laterality: N/A;  . TEE WITHOUT CARDIOVERSION N/A 10/04/2012   Procedure: TRANSESOPHAGEAL ECHOCARDIOGRAM (TEE);  Surgeon: Thayer Headings, MD;  Location: [facility] END[facility]COPY;  Service: Cardiovascular;  Laterality: N/A;  . [place] accident  Both legs fractured and repaired surgically    FAMILY HISTORY: Family History  Problem Relation Age of Onset  . Diabetes Mother   . Hypertension Mother   . Diabetes Father   . Hypertension Father   . Colon polyps Neg Hx   . Colon cancer Neg Hx     SOCIAL HISTORY:  Social History   Social History  . Marital status: Single    Spouse name: N/A  . Number of children: 0  . Years of education: HS   Occupational History   . Disabled    Social History Main Topics  . Smoking status: Former Smoker    Packs/day: 1.00    Years: 29.00    Types: Cigarettes    Quit date: 02/22/1992  . Smokeless tobacco: Never Used  . Alcohol use 0.6 - 1.2 oz/week    1 - 2 Cans of beer per week     Comment: occasionally, every 2-3 days  . Drug use: Yes    Types: Marijuana, "Crack" cocaine     Comment: hx of crack cocaine "long time ago" per pt - hard to tell how long it is has been since he used  . Sexual activity: No   Other Topics Concern  . Not on file   Social History Narrative   Patient lives at home with his family. Brother, sister, mother    Patient does not work.    Patient has a high school education.    Patient has one step child      PHYSICAL EXAM   Vitals:   11/02/16 1329  BP: 130/82  Weight: 167 lb (75.8 kg)  Height: 5\' 4"  (1.626 m)    Not recorded      Body mass index is 28.67 kg/m.   PHYSICAL EXAMNIATION:  Gen: NAD, conversant, well nourised, obese, well groomed                     Cardiovascular: Regular rate rhythm, no peripheral edema, warm, nontender. Eyes: Conjunctivae clear without exudates or hemorrhage Neck: Supple, no carotid bruise. Pulmonary: Clear to auscultation bilaterally   NEUROLOGICAL EXAM:  MENTAL STATUS: Speech: He is mute depend on interpreter for history,   CRANIAL NERVES: CN II: Visual fields are full to confrontation. Fundoscopic exam is normal with sharp discs and no vascular changes. Pupils are round equal and briskly reactive to light. CN III, IV, VI: extraocular movement are normal. No ptosis. CN V: Facial sensation is intact to pinprick in all 3 divisions bilaterally. Corneal responses are intact.  CN VII: Face is symmetric with normal eye closure and smile. CN VIII: deaf CN IX, X: Palate elevates symmetrically. Phonation is normal. CN XI: Head turning and shoulder shrug are intact CN XII: Tongue is midline with normal movements and no  atrophy.  MOTOR: Spastic left  hemiparesis, wearing left ankle brace, mild left shoulder antigravity movement left shoulder abduction with passive movement maximum 90, left elbow 170, complains of left elbow pain with passive movement, left thumb in position, left hip flexion 3, left knee extension 4, left knee flexion 4, he also has mild right ankle dorsiflexion weakness.  He has right elbow Tinel signs, mild right finger abduction weakness, mild right hand grip weakness  REFLEXES:  Hyperreflexia of both side, worse on the left side, bilateral Babinski sign.  SENSORY: Intact to light touch, pinprick, positional and vibratory sensation are intact in fingers and toes, with exception of decreased light touch in the right fifth, and half of right fourth finger extending to right ulnar palmar, dorsum of right hand  COORDINATION: There is no dysmetria on right finger-to-nose and heel-knee-shin.    GAIT/STANCE: Rely on his 4 foot cane, multiple tends to get up from seated position, left hemi-circumferential, unsteady gait      Lab Results  Component Value Date   WBC 4.3 09/13/2016   HGB 13.6 09/13/2016   HCT 39.2 09/13/2016   MCV 87.7 09/13/2016   PLT 106 (L) 09/13/2016      Component Value Date/Time   NA 137 09/13/2016 0743   K 3.8 09/13/2016 0743   CL 100 (L) 09/13/2016 0743   CO2 30 09/13/2016 0743   GLUCOSE 117 (H) 09/13/2016 0743   BUN 13 09/13/2016 0743   CREATININE 1.08 09/13/2016 0743   CALCIUM 8.5 (L) 09/13/2016 0743   PROT 7.2 09/12/2016 1428   ALBUMIN 4.2 09/12/2016 1428   AST 27 09/12/2016 1428   ALT 25 09/12/2016 1428   ALKPHOS 60 09/12/2016 1428   BILITOT 0.6 09/12/2016 1428   GFRNONAA >60 09/13/2016 0743   GFRAA >60 09/13/2016 0743   Lab Results  Component Value Date   CHOL 160 09/13/2016   HDL 59 09/13/2016   LDLCALC 43 09/13/2016   TRIG 288 (H) 09/13/2016   CHOLHDL 2.7 09/13/2016   Lab Results  Component Value Date   HGBA1C 5.8 (H) 09/12/2016    Lab Results  Component Value Date   VITAMINB12 405 09/12/2016   Lab Results  Component Value Date   TSH 2.679 09/12/2016      ASSESSMENT AND PLAN  TOREN TUCHOLSKI is a 61 y.o. male    Cervical spondylitic myelopathy  Status post cervical decompression surgery in August 2017, Continued to complains of neck pain  I have encouraged him neck stretching exercise, hot compression  Continue gabapentin 300 mg 3 times a day  Tizanidine 2 mg 3 times a day as needed,  Tramadol, Mobic as needed for pain  Keep Cymbalta 60 mg daily  Return to clinic in 4 week  Low back pain, left anterior thigh and hip pain, gait abnormality  Musculoskeletal etiology  Continue hot compression  Mobic 7.5 milligrams twice a day,  Tramadol as needed  Refer him to physical therapy  Congenital deaf, use sign language, history is through interpreter   History of stroke, with residual spastic left hemiparesis  electrical stimulation guided xeomin injection, used 500 units  Left brachialis 100 units Left pronator teres 75 units Left palmaris longus 25 units  Left flexor digitorum superficialis 25 units Left flexor digitorum profundus right 25 units Left opponens 25 units Left lumbricals 25 units Left flexor carpi ulnaris 50 units Left flexor carpi radialis 50 units  Left pectoralis major 100 units Left teres major 50 units Left latissimus dorsi 50 units   He will  return to clinic in 3 months for repeat injection  Marcial Pacas, M.D. Ph.D.  [facility] [address] [place], [place] [address] [phone]

## 2016-11-07 DIAGNOSIS — M5440 Lumbago with sciatica, unspecified side: Secondary | ICD-10-CM | POA: Diagnosis not present

## 2016-11-22 DIAGNOSIS — Z6824 Body mass index (BMI) 24.0-24.9, adult: Secondary | ICD-10-CM | POA: Diagnosis not present

## 2016-11-22 DIAGNOSIS — G8110 Spastic hemiplegia affecting unspecified side: Secondary | ICD-10-CM | POA: Diagnosis not present

## 2016-11-22 DIAGNOSIS — R682 Dry mouth, unspecified: Secondary | ICD-10-CM | POA: Diagnosis not present

## 2016-11-22 DIAGNOSIS — I739 Peripheral vascular disease, unspecified: Secondary | ICD-10-CM | POA: Diagnosis not present

## 2016-11-22 DIAGNOSIS — J45909 Unspecified asthma, uncomplicated: Secondary | ICD-10-CM | POA: Diagnosis not present

## 2016-11-22 DIAGNOSIS — I639 Cerebral infarction, unspecified: Secondary | ICD-10-CM | POA: Diagnosis not present

## 2016-11-22 DIAGNOSIS — Z299 Encounter for prophylactic measures, unspecified: Secondary | ICD-10-CM | POA: Diagnosis not present

## 2016-11-22 DIAGNOSIS — I10 Essential (primary) hypertension: Secondary | ICD-10-CM | POA: Diagnosis not present

## 2016-11-22 DIAGNOSIS — R569 Unspecified convulsions: Secondary | ICD-10-CM | POA: Diagnosis not present

## 2016-11-22 DIAGNOSIS — Z23 Encounter for immunization: Secondary | ICD-10-CM | POA: Diagnosis not present

## 2016-11-22 DIAGNOSIS — E1165 Type 2 diabetes mellitus with hyperglycemia: Secondary | ICD-10-CM | POA: Diagnosis not present

## 2016-11-22 DIAGNOSIS — I679 Cerebrovascular disease, unspecified: Secondary | ICD-10-CM | POA: Diagnosis not present

## 2016-12-02 DIAGNOSIS — H9190 Unspecified hearing loss, unspecified ear: Secondary | ICD-10-CM | POA: Diagnosis not present

## 2016-12-02 DIAGNOSIS — Z8673 Personal history of transient ischemic attack (TIA), and cerebral infarction without residual deficits: Secondary | ICD-10-CM | POA: Diagnosis not present

## 2016-12-02 DIAGNOSIS — Z7952 Long term (current) use of systemic steroids: Secondary | ICD-10-CM | POA: Diagnosis not present

## 2016-12-02 DIAGNOSIS — L988 Other specified disorders of the skin and subcutaneous tissue: Secondary | ICD-10-CM | POA: Diagnosis not present

## 2016-12-02 DIAGNOSIS — E119 Type 2 diabetes mellitus without complications: Secondary | ICD-10-CM | POA: Diagnosis not present

## 2016-12-02 DIAGNOSIS — Z7984 Long term (current) use of oral hypoglycemic drugs: Secondary | ICD-10-CM | POA: Diagnosis not present

## 2016-12-02 DIAGNOSIS — I10 Essential (primary) hypertension: Secondary | ICD-10-CM | POA: Diagnosis not present

## 2016-12-02 DIAGNOSIS — L853 Xerosis cutis: Secondary | ICD-10-CM | POA: Diagnosis not present

## 2016-12-02 DIAGNOSIS — Z87891 Personal history of nicotine dependence: Secondary | ICD-10-CM | POA: Diagnosis not present

## 2016-12-12 DIAGNOSIS — M5416 Radiculopathy, lumbar region: Secondary | ICD-10-CM | POA: Diagnosis not present

## 2016-12-12 DIAGNOSIS — M5440 Lumbago with sciatica, unspecified side: Secondary | ICD-10-CM | POA: Diagnosis not present

## 2016-12-15 DIAGNOSIS — Z886 Allergy status to analgesic agent status: Secondary | ICD-10-CM | POA: Diagnosis not present

## 2016-12-15 DIAGNOSIS — I69354 Hemiplegia and hemiparesis following cerebral infarction affecting left non-dominant side: Secondary | ICD-10-CM | POA: Diagnosis not present

## 2016-12-15 DIAGNOSIS — G839 Paralytic syndrome, unspecified: Secondary | ICD-10-CM | POA: Diagnosis not present

## 2016-12-15 DIAGNOSIS — H9190 Unspecified hearing loss, unspecified ear: Secondary | ICD-10-CM | POA: Diagnosis not present

## 2016-12-15 DIAGNOSIS — Z87891 Personal history of nicotine dependence: Secondary | ICD-10-CM | POA: Diagnosis not present

## 2016-12-15 DIAGNOSIS — E119 Type 2 diabetes mellitus without complications: Secondary | ICD-10-CM | POA: Diagnosis not present

## 2016-12-15 DIAGNOSIS — I6789 Other cerebrovascular disease: Secondary | ICD-10-CM | POA: Diagnosis not present

## 2016-12-15 DIAGNOSIS — Z79899 Other long term (current) drug therapy: Secondary | ICD-10-CM | POA: Diagnosis not present

## 2016-12-15 DIAGNOSIS — R61 Generalized hyperhidrosis: Secondary | ICD-10-CM | POA: Diagnosis not present

## 2016-12-15 DIAGNOSIS — R531 Weakness: Secondary | ICD-10-CM | POA: Diagnosis not present

## 2016-12-15 DIAGNOSIS — Z91013 Allergy to seafood: Secondary | ICD-10-CM | POA: Diagnosis not present

## 2016-12-15 DIAGNOSIS — Z91041 Radiographic dye allergy status: Secondary | ICD-10-CM | POA: Diagnosis not present

## 2016-12-15 DIAGNOSIS — R262 Difficulty in walking, not elsewhere classified: Secondary | ICD-10-CM | POA: Diagnosis not present

## 2016-12-15 DIAGNOSIS — I10 Essential (primary) hypertension: Secondary | ICD-10-CM | POA: Diagnosis not present

## 2016-12-15 DIAGNOSIS — R29818 Other symptoms and signs involving the nervous system: Secondary | ICD-10-CM | POA: Diagnosis not present

## 2016-12-15 DIAGNOSIS — Z7952 Long term (current) use of systemic steroids: Secondary | ICD-10-CM | POA: Diagnosis not present

## 2016-12-15 DIAGNOSIS — Z7951 Long term (current) use of inhaled steroids: Secondary | ICD-10-CM | POA: Diagnosis not present

## 2016-12-15 DIAGNOSIS — R4182 Altered mental status, unspecified: Secondary | ICD-10-CM | POA: Diagnosis not present

## 2016-12-15 DIAGNOSIS — R569 Unspecified convulsions: Secondary | ICD-10-CM | POA: Diagnosis not present

## 2016-12-16 DIAGNOSIS — R262 Difficulty in walking, not elsewhere classified: Secondary | ICD-10-CM | POA: Diagnosis not present

## 2016-12-16 DIAGNOSIS — E119 Type 2 diabetes mellitus without complications: Secondary | ICD-10-CM | POA: Diagnosis not present

## 2016-12-16 DIAGNOSIS — R4182 Altered mental status, unspecified: Secondary | ICD-10-CM | POA: Diagnosis not present

## 2016-12-16 DIAGNOSIS — H9190 Unspecified hearing loss, unspecified ear: Secondary | ICD-10-CM | POA: Diagnosis not present

## 2016-12-20 ENCOUNTER — Encounter: Payer: Self-pay | Admitting: Cardiology

## 2016-12-20 NOTE — Progress Notes (Deleted)
Cardiology Office Note  Date: 12/20/2016   ID: Charles [name], Charles [name] 1955/08/22, MRN [id number]  PCP: [name], MD  Primary Cardiologist: Rozann Lesches, MD   No chief complaint on file.   History of Present Illness: Charles [name] is a 61 y.o. male last seen in September 2016.  Past Medical History:  Diagnosis Date  . Anemia   . Asthma   . Deaf   . Essential hypertension   . GERD (gastroesophageal reflux disease)   . History of migraine   . History of stroke    Spastic hemiplegia, right MCA stroke April 2014 in [place]  . Mixed hyperlipidemia   . Other pancytopenia ([place]) 12/23/2015  . PAD (peripheral artery disease) ([place])   . Sarcoidosis   . Seizures ([place])   . Type 2 diabetes mellitus ([place])     Past Surgical History:  Procedure Laterality Date  . BRAVO PH STUDY N/A 06/18/2013   pH STUDY SHOWS NEXIUM TWICE DAILY CONTROLS THE ACID IN HIS STOMACH. HE HAD VERY FEWEPISODE OF REGURGITATION RECORDED IN THE 2 DAYS THE STUDY WAS PERFORMED.   [name] COLONOSCOPY  2011   [place]  . 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   . ESOPHAGOGASTRODUODENOSCOPY N/A 05/05/2015   Procedure: ESOPHAGOGASTRODUODENOSCOPY (EGD);  Surgeon: Danie Binder, MD;  [facility];  Service: Endoscopy;  Laterality: N/A;  730  . MALONEY DILATION N/A 06/18/2013   Procedure: MALONEY DILATION;  Surgeon: Danie Binder, MD;  [facility];  Service: Endoscopy;  Laterality: N/A;  . OTHER SURGICAL HISTORY     cyst removal head and chest  . POSTERIOR CERVICAL FUSION/FORAMINOTOMY N/A 10/07/2015   Procedure: Cervical Three-Four, Cervical Four-Five, Cervical Five-Six, Cervical Six-Seven Posterior  Cervical Laminectomy and Fusion;  Surgeon: Kary Kos, MD;  Location: [facility];  Service: Neurosurgery;  Laterality: N/A;  . SAVORY DILATION N/A 06/18/2013   Procedure: SAVORY DILATION;  Surgeon: Danie Binder, MD;  [facility];  Service: Endoscopy;  Laterality: N/A;  . TEE WITHOUT CARDIOVERSION N/A 10/04/2012   Procedure: TRANSESOPHAGEAL ECHOCARDIOGRAM (TEE);  Surgeon: Thayer Headings, MD;  Location: [facility];  Service: Cardiovascular;  Laterality: N/A;  . [place] accident  Both legs fractured and repaired surgically    Current Outpatient Prescriptions  Medication Sig Dispense Refill  . albuterol (PROAIR HFA) 108 (90 Base) MCG/ACT inhaler Inhale 2 puffs into the lungs every 6 (six) hours as needed for wheezing or shortness of breath.    [name] aspirin 325 MG EC tablet Take 325 mg by mouth daily.    [name] atorvastatin (LIPITOR) 10 MG tablet Take 10 mg by mouth daily. (cholesterol)    . cholecalciferol (VITAMIN D) 1000 units tablet Take 1,000 Units by mouth daily.    [name] escitalopram (LEXAPRO) 5 MG tablet Take 5 mg by mouth daily.     [name] esomeprazole (NEXIUM) 40 MG capsule Take 1 capsule (40 mg total) by mouth 2 (two) times daily. (Patient taking differently: Take 40 mg by mouth daily. ) 60 capsule 0  . gabapentin (NEURONTIN) 300 MG capsule Take 1 capsule (300 mg total) by mouth 3 (three) times daily. 90 capsule 11  . glucose blood (ONETOUCH  VERIO) test strip 1 each by Other route daily. And lancets 1/day 100 each 3  . hydrochlorothiazide (MICROZIDE) 12.5 MG capsule Take 12.5 mg by mouth daily.    [name] levETIRAcetam (KEPPRA) 500 MG tablet Take 1 tablet (500 mg total) by mouth 2 (two) times daily. 60 tablet 0  . losartan (COZAAR) 50 MG tablet Take 50 mg by mouth daily.    . Melatonin 5 MG TABS Take 5 mg by mouth at bedtime.    . mometasone-formoterol (DULERA) 100-5 MCG/ACT AERO Inhale 2 puffs into the lungs every 12 (twelve) hours. 1 Inhaler 11  . naproxen  (NAPROSYN) 375 MG tablet Take 375 mg by mouth 2 (two) times daily with a meal.    . oxybutynin (DITROPAN XL) 15 MG 24 hr tablet Take 15 mg by mouth at bedtime.    . predniSONE (DELTASONE) 1 MG tablet Take 1 mg by mouth daily with breakfast.    . tiotropium (SPIRIVA HANDIHALER) 18 MCG inhalation capsule Place 18 mcg into inhaler and inhale daily.     [name] tiZANidine (ZANAFLEX) 2 MG tablet Take 1 tablet (2 mg total) by mouth 3 (three) times daily. 60 tablet 6   Current Facility-Administered Medications  Medication Dose Route Frequency Provider Last Rate Last Dose  . incobotulinumtoxinA ([name]) 100 units injection 500 Units  500 Units Intramuscular Q90 days Marcial Pacas, MD   500 Units at 07/26/16 1410  . incobotulinumtoxinA ([name]) 100 units injection 500 Units  500 Units Intramuscular Q90 days Marcial Pacas, MD   500 Units at 11/02/16 1406   Allergies:  Shellfish allergy; Hydrocodone; Contrast media [iodinated diagnostic agents]; and Oxycodone   Social History: The patient  reports that he quit smoking about 24 years ago. His smoking use included Cigarettes. He has a 29.00 pack-year smoking history. He has never used smokeless tobacco. He reports that he drinks about 0.6 - 1.2 oz of alcohol per week . He reports that he uses drugs, including Marijuana and "Crack" cocaine.   Family History: The patient's family history includes Diabetes in his father and mother; Hypertension in his father and mother.   ROS:  Please see the history of present illness. Otherwise, complete review of systems is positive for {NONE DEFAULTED:18576::"none"}.  All other systems are reviewed and negative.   Physical Exam: VS:  There were no vitals taken for this visit., BMI There is no height or weight on file to calculate BMI.  Wt Readings from Last 3 Encounters:  11/02/16 167 lb (75.8 kg)  09/12/16 167 lb 3.2 oz (75.8 kg)  07/26/16 167 lb (75.8 kg)    General: Patient appears comfortable at rest. HEENT: Conjunctiva and  lids normal, oropharynx clear with moist mucosa. Neck: Supple, no elevated JVP or carotid bruits, no thyromegaly. Lungs: Clear to auscultation, nonlabored breathing at rest. Cardiac: Regular rate and rhythm, no S3 or significant systolic murmur, no pericardial rub. Abdomen: Soft, nontender, no hepatomegaly, bowel sounds present, no guarding or rebound. Extremities: No pitting edema, distal pulses 2+. Skin: Warm and dry. Musculoskeletal: No kyphosis. Neuropsychiatric: Alert and oriented x3, affect grossly appropriate.  ECG: I personally reviewed the tracing from 09/12/2016 which showed sinus rhythm with increased voltage and repolarization changes.  Recent Labwork: 09/12/2016: ALT 25; AST 27; TSH 2.679 09/13/2016: BUN 13; Creatinine, Ser 1.08; Hemoglobin 13.6; Magnesium 1.8; Platelets 106; Potassium 3.8; Sodium 137     Component Value Date/Time   CHOL 160 09/13/2016 0202   TRIG 288 (H) 09/13/2016 0202   HDL 59 09/13/2016  0202   CHOLHDL 2.7 09/13/2016 0202   VLDL 58 (H) 09/13/2016 0202   LDLCALC 43 09/13/2016 0202    Other Studies Reviewed Today:  Echocardiogram 09/13/2016: Study Conclusions  - Left ventricle: The cavity size was normal. Wall thickness was   increased in a pattern of mild LVH. Systolic function was normal.   The estimated ejection fraction was in the range of 60% to 65%.   Wall motion was normal; there were no regional wall motion   abnormalities. Doppler parameters are consistent with abnormal   left ventricular relaxation (grade 1 diastolic dysfunction). - Aortic valve: Valve area (VTI): 2.67 cm^2. Valve area (Vmax):   2.67 cm^2. Valve area (Vmean): 2.64 cm^2. - Technically adequate study.  Carotid Dopplers 09/13/2016: IMPRESSION: 1. Mild (1-49%) stenosis proximal right internal carotid artery secondary to smooth heterogeneous atherosclerotic plaque. 2. Mild (1-49%) stenosis proximal left internal carotid artery secondary to smooth heterogeneous  atherosclerotic plaque. 3. Both vertebral arteries are patent with antegrade flow in the neck. However, the left vertebral artery waveform is slightly abnormal suggesting the possibility of distal occlusion.  Chest x-ray 09/12/2016: INDINGS: Cardiac shadows within normal limits. Multiple calcified hilar and mediastinal lymph nodes are seen and stable. Patchy scarring is noted in the right lung base similar to that seen on the prior exam. Some scattered calcified granulomas are seen. Postsurgical changes in the cervical spine are noted. No acute bony abnormality is seen.  IMPRESSION: Changes of prior granulomatous disease.  No acute abnormality seen.  Assessment and Plan:    Current medicines were reviewed with the patient today.  No orders of the defined types were placed in this encounter.   Disposition:  Signed, Satira Sark, MD, [facility] 12/20/2016 11:42 AM    [facility] [place], [place], [place] [address] Phone: [phone]; Fax: [phone]

## 2016-12-21 ENCOUNTER — Ambulatory Visit (INDEPENDENT_AMBULATORY_CARE_PROVIDER_SITE_OTHER): Payer: Medicare Other | Admitting: Podiatry

## 2016-12-21 ENCOUNTER — Ambulatory Visit: Payer: Medicare Other | Admitting: Cardiology

## 2016-12-21 ENCOUNTER — Encounter: Payer: Self-pay | Admitting: Cardiology

## 2016-12-21 ENCOUNTER — Encounter: Payer: Self-pay | Admitting: Podiatry

## 2016-12-21 DIAGNOSIS — E119 Type 2 diabetes mellitus without complications: Secondary | ICD-10-CM

## 2016-12-21 DIAGNOSIS — B351 Tinea unguium: Secondary | ICD-10-CM | POA: Diagnosis not present

## 2016-12-21 DIAGNOSIS — M79609 Pain in unspecified limb: Secondary | ICD-10-CM | POA: Diagnosis not present

## 2016-12-21 NOTE — Progress Notes (Addendum)
Patient ID: DECKER COGDELL, male   DOB: 18-Dec-1955, 61 y.o.   MRN: [id number] Complaint:  Visit Type: Patient returns to my office for continued preventative foot care services. Complaint: Patient states" my nails have grown long and thick and become painful to walk and wear shoes" Patient has been diagnosed with DM with no foot complications. The patient presents for preventative foot care services. No changes to ROS  Podiatric Exam: Vascular: dorsalis pedis and posterior tibial pulses are palpable bilateral. Capillary return is immediate. Temperature gradient is WNL. Skin turgor WNL  Sensorium: Normal Semmes Weinstein monofilament test. Normal tactile sensation bilaterally. Nail Exam: Pt has thick disfigured discolored nails with subungual debris noted bilateral entire nail hallux  Ulcer Exam: There is no evidence of ulcer or pre-ulcerative changes or infection. Orthopedic Exam: Muscle tone and strength are WNL. No limitations in general ROM. No crepitus or effusions noted. Foot type and digits show no abnormalities. Bony prominences are unremarkable. Skin: No Porokeratosis. No infection or ulcers  Diagnosis:  Onychomycosis, , Pain in right toe, pain in left toes  Treatment & Plan Procedures and Treatment: Consent by patient was obtained for treatment procedures. The patient understood the discussion of treatment and procedures well. All questions were answered thoroughly reviewed. Debridement of mycotic and hypertrophic toenails, 1 through 5 bilateral and clearing of subungual debris. No ulceration, no infection noted. ABN signed for 2018. Return Visit-Office Procedure: Patient instructed to return to the office for a follow up visit 3 months for continued evaluation and treatment.   Gardiner Barefoot DPM

## 2016-12-22 DIAGNOSIS — M5440 Lumbago with sciatica, unspecified side: Secondary | ICD-10-CM | POA: Diagnosis not present

## 2017-01-02 DIAGNOSIS — Z6828 Body mass index (BMI) 28.0-28.9, adult: Secondary | ICD-10-CM | POA: Diagnosis not present

## 2017-01-02 DIAGNOSIS — I10 Essential (primary) hypertension: Secondary | ICD-10-CM | POA: Diagnosis not present

## 2017-01-02 DIAGNOSIS — M5440 Lumbago with sciatica, unspecified side: Secondary | ICD-10-CM | POA: Diagnosis not present

## 2017-01-05 DIAGNOSIS — R569 Unspecified convulsions: Secondary | ICD-10-CM | POA: Diagnosis not present

## 2017-01-05 DIAGNOSIS — F419 Anxiety disorder, unspecified: Secondary | ICD-10-CM | POA: Diagnosis not present

## 2017-01-05 DIAGNOSIS — Z299 Encounter for prophylactic measures, unspecified: Secondary | ICD-10-CM | POA: Diagnosis not present

## 2017-01-05 DIAGNOSIS — M62838 Other muscle spasm: Secondary | ICD-10-CM | POA: Diagnosis not present

## 2017-01-05 DIAGNOSIS — E1165 Type 2 diabetes mellitus with hyperglycemia: Secondary | ICD-10-CM | POA: Diagnosis not present

## 2017-01-05 DIAGNOSIS — Z6824 Body mass index (BMI) 24.0-24.9, adult: Secondary | ICD-10-CM | POA: Diagnosis not present

## 2017-01-05 DIAGNOSIS — I739 Peripheral vascular disease, unspecified: Secondary | ICD-10-CM | POA: Diagnosis not present

## 2017-01-13 DIAGNOSIS — M1991 Primary osteoarthritis, unspecified site: Secondary | ICD-10-CM | POA: Diagnosis not present

## 2017-01-13 DIAGNOSIS — Z7984 Long term (current) use of oral hypoglycemic drugs: Secondary | ICD-10-CM | POA: Diagnosis not present

## 2017-01-13 DIAGNOSIS — E1165 Type 2 diabetes mellitus with hyperglycemia: Secondary | ICD-10-CM | POA: Diagnosis not present

## 2017-01-13 DIAGNOSIS — Z7952 Long term (current) use of systemic steroids: Secondary | ICD-10-CM | POA: Diagnosis not present

## 2017-01-13 DIAGNOSIS — Z7982 Long term (current) use of aspirin: Secondary | ICD-10-CM | POA: Diagnosis not present

## 2017-01-13 DIAGNOSIS — I69334 Monoplegia of upper limb following cerebral infarction affecting left non-dominant side: Secondary | ICD-10-CM | POA: Diagnosis not present

## 2017-01-13 DIAGNOSIS — F419 Anxiety disorder, unspecified: Secondary | ICD-10-CM | POA: Diagnosis not present

## 2017-01-13 DIAGNOSIS — I739 Peripheral vascular disease, unspecified: Secondary | ICD-10-CM | POA: Diagnosis not present

## 2017-01-13 DIAGNOSIS — Z87891 Personal history of nicotine dependence: Secondary | ICD-10-CM | POA: Diagnosis not present

## 2017-01-13 DIAGNOSIS — G40909 Epilepsy, unspecified, not intractable, without status epilepticus: Secondary | ICD-10-CM | POA: Diagnosis not present

## 2017-01-13 DIAGNOSIS — H9190 Unspecified hearing loss, unspecified ear: Secondary | ICD-10-CM | POA: Diagnosis not present

## 2017-01-13 DIAGNOSIS — M62838 Other muscle spasm: Secondary | ICD-10-CM | POA: Diagnosis not present

## 2017-01-13 DIAGNOSIS — Z7902 Long term (current) use of antithrombotics/antiplatelets: Secondary | ICD-10-CM | POA: Diagnosis not present

## 2017-01-13 DIAGNOSIS — Z9181 History of falling: Secondary | ICD-10-CM | POA: Diagnosis not present

## 2017-01-17 DIAGNOSIS — M62838 Other muscle spasm: Secondary | ICD-10-CM | POA: Diagnosis not present

## 2017-01-17 DIAGNOSIS — G40909 Epilepsy, unspecified, not intractable, without status epilepticus: Secondary | ICD-10-CM | POA: Diagnosis not present

## 2017-01-17 DIAGNOSIS — I739 Peripheral vascular disease, unspecified: Secondary | ICD-10-CM | POA: Diagnosis not present

## 2017-01-17 DIAGNOSIS — M1991 Primary osteoarthritis, unspecified site: Secondary | ICD-10-CM | POA: Diagnosis not present

## 2017-01-17 DIAGNOSIS — I69334 Monoplegia of upper limb following cerebral infarction affecting left non-dominant side: Secondary | ICD-10-CM | POA: Diagnosis not present

## 2017-01-17 DIAGNOSIS — E1165 Type 2 diabetes mellitus with hyperglycemia: Secondary | ICD-10-CM | POA: Diagnosis not present

## 2017-01-19 IMAGING — MR MR CERVICAL SPINE WO/W CM
7 of 9 series · 31 of 48 positions shown · IV contrast (14ml Multihance)
Comparison: Cervical MRI 08/21/2015

CLINICAL DATA: Cervical myelopathy. Neck pain. Cervical fusion
10/07/2015

EXAM:
MRI CERVICAL SPINE WITHOUT AND WITH CONTRAST
TECHNIQUE: Multiplanar and multiecho pulse sequences of the cervical spine, to
include the craniocervical junction and cervicothoracic junction,
were obtained according to standard protocol without and with
intravenous contrast.
CONTRAST:  14mL MULTIHANCE GADOBENATE DIMEGLUMINE 529 MG/ML IV SOLN

[Series 4: T1 · sagittal · 3.0mm · 0.41mm/px · 3 of 13 slices shown (1 of 2)]
[im 1/13]
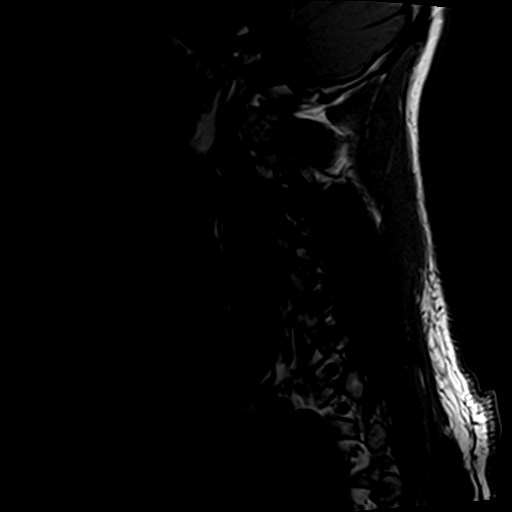
[im 7/13]
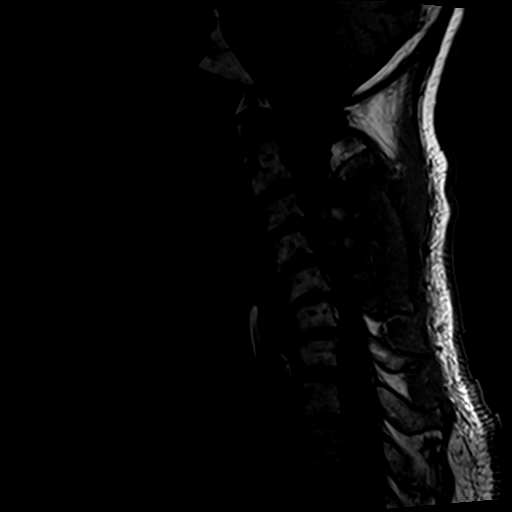
[im 13/13]
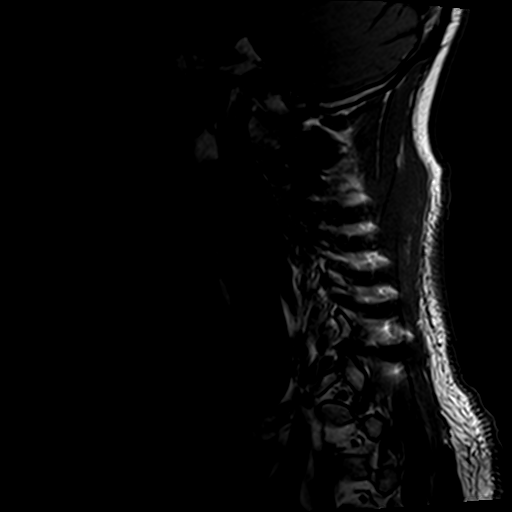

[Series 5: T2 · axial · 3.0mm · 0.70mm/px · z∈[-16,+84]mm · 7 of 28 slices shown (1 of 2)]
[im 1/28]
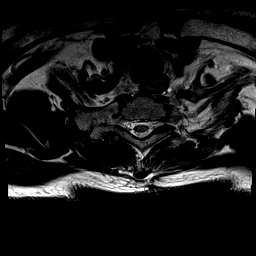
[im 5/28]
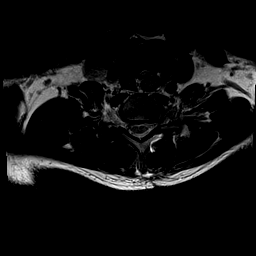
[im 10/28]
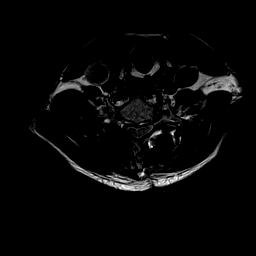
[im 14/28]
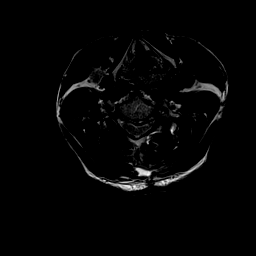
[im 19/28]
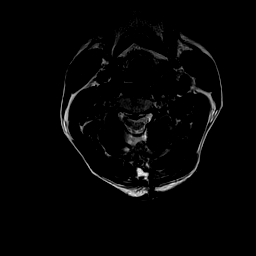
[im 23/28]
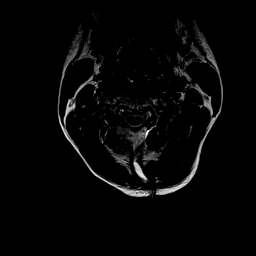
[im 28/28]
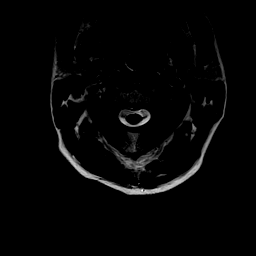

[Series 7: T1 · axial · 3.0mm · 0.35mm/px · z∈[-20,+80]mm · 8 of 28 slices shown (2 of 2)]
[im 1/28]
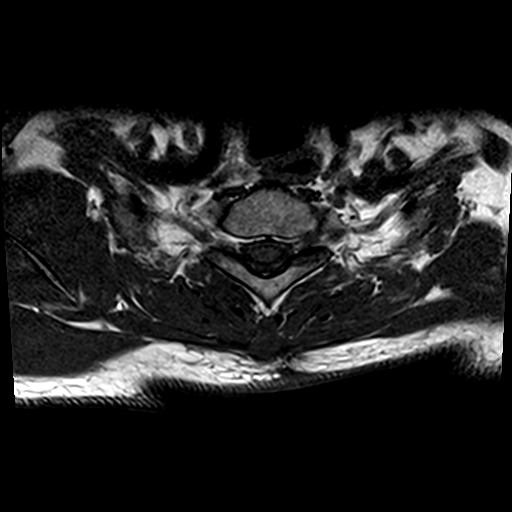
[im 4/28]
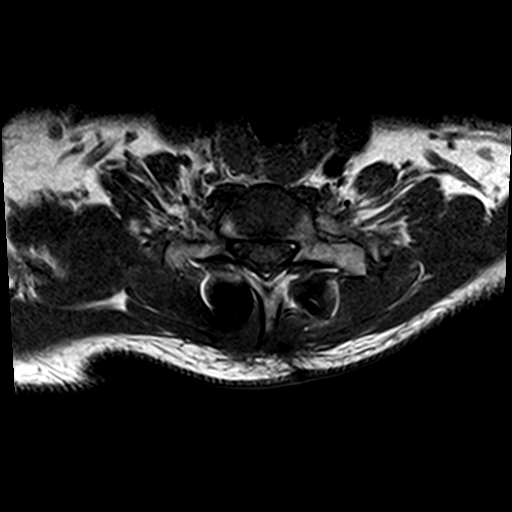
[im 8/28]
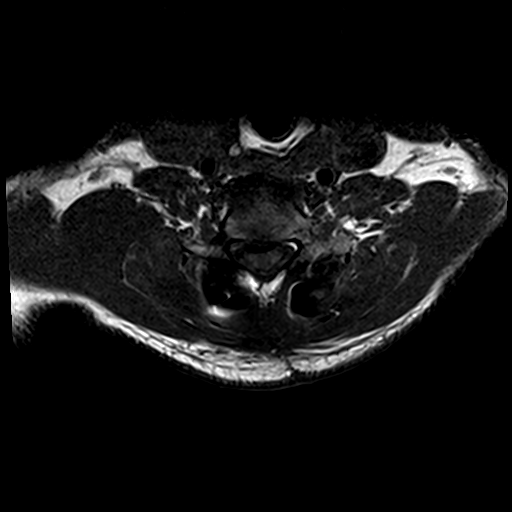
[im 12/28]
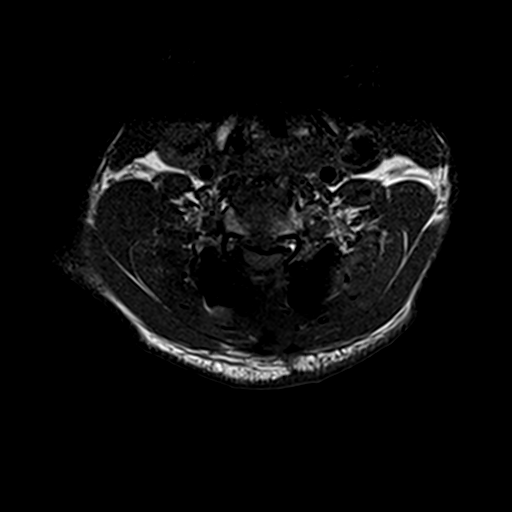
[im 16/28]
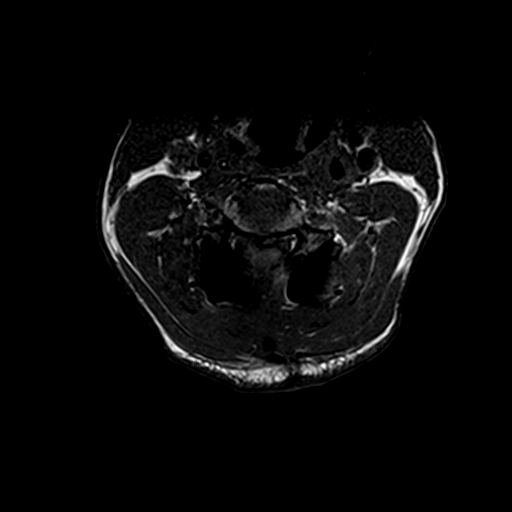
[im 20/28]
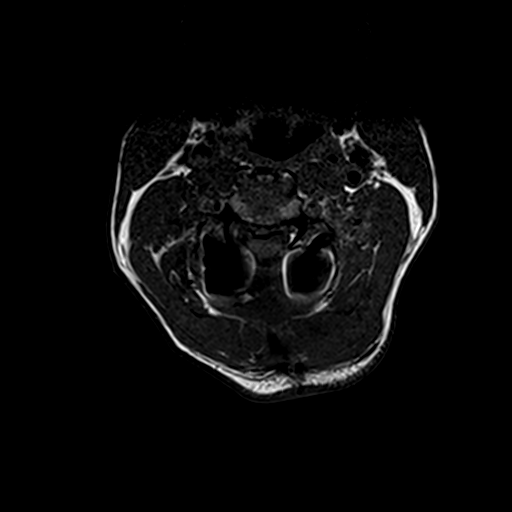
[im 24/28]
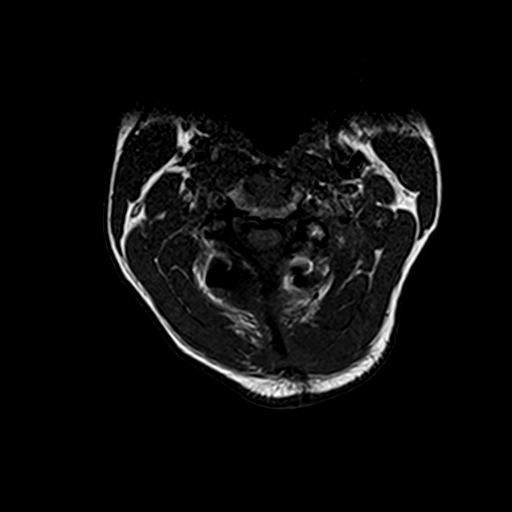
[im 28/28]
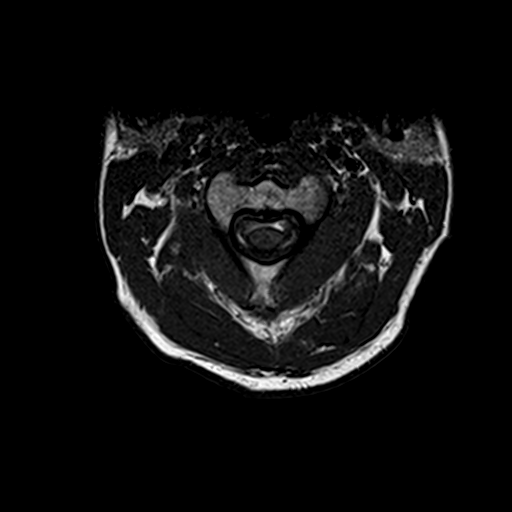

[Series 8: T2 · sagittal · 3.0mm · 0.66mm/px · 4 of 13 slices shown (2 of 2)]
[im 1/13]
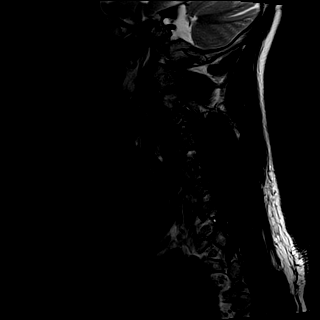
[im 5/13]
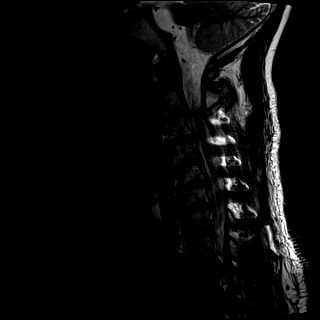
[im 9/13]
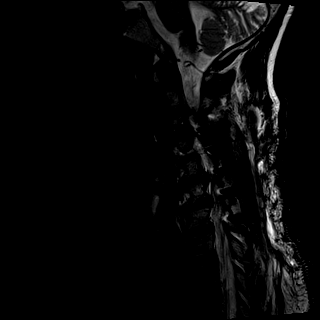
[im 13/13]
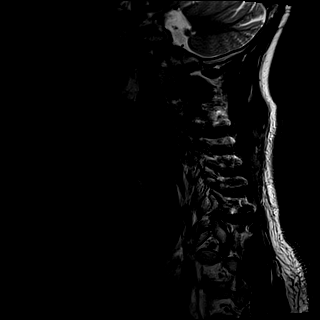

[Series 9: T1 post-contrast · axial · 3.0mm · 0.35mm/px · 1 of 28 slices shown]
[im 1/28]
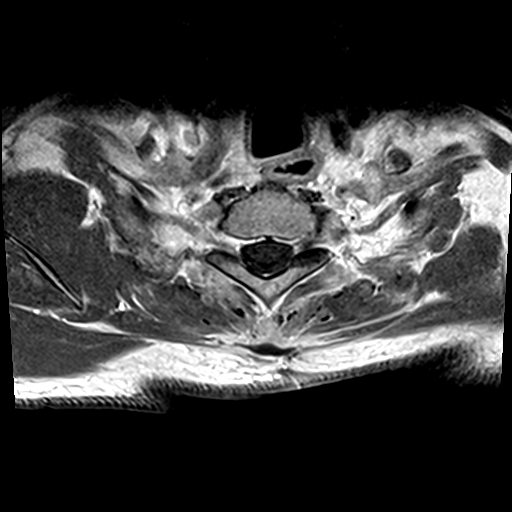

[Series 10: T1 fat-sat post-contrast · sagittal · 3.0mm · 0.82mm/px · 4 of 13 slices shown (1 of 2)]
[im 1/13]
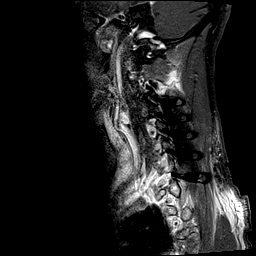
[im 5/13]
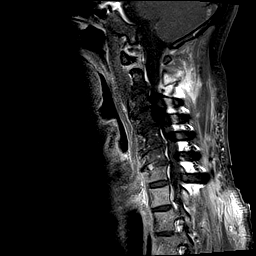
[im 9/13]
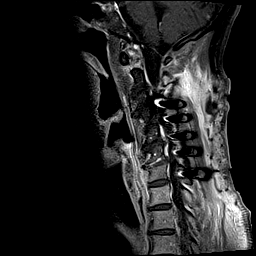
[im 13/13]
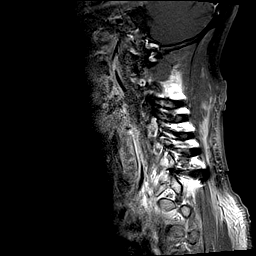

[Series 11: T1 fat-sat post-contrast · sagittal · 3.0mm · 0.82mm/px · 4 of 13 slices shown (2 of 2)]
[im 1/13]
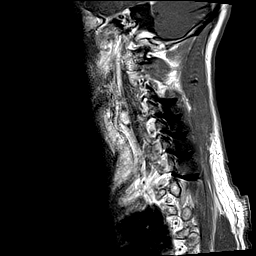
[im 5/13]
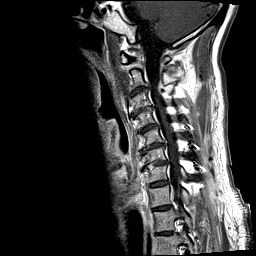
[im 9/13]
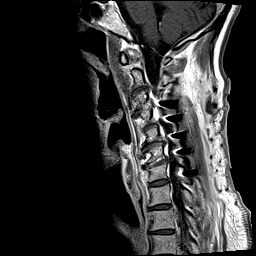
[im 13/13]
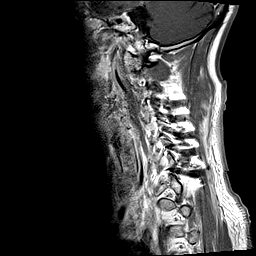

[31 of 48 positions shown; findings below may reference images not displayed]

FINDINGS: Interval posterior neck surgery. Laminectomy C3 through C5.
Posterior screw and rod fusion C3 through C7. Artifact related to
hardware overlying the canal.

Small hyperintensity in the cord at the C4 level is unchanged from
the preop study compatible chronic myelomalacia. No new area of cord
signal abnormality. No cord compression.

Postop enhancement in the posterior soft tissues. Small postop fluid
collection is present posterior to the dura at C3 and near the
subcutaneous tissues between C3 and C5. This could be a CSF leak or
postop fluid.

Negative for discectomy or anterior fusion. Multilevel cervical
spine degenerative changes similar to the prior study.

C2-3: Disc bulging and diffuse uncinate spurring. Mild spinal
stenosis unchanged

C3-4: Diffuse uncinate spurring with small central disc protrusion.
Posterior decompression

C4-5: Central osteophyte with cord flattening. Posterior
decompression

C5-6: Central disc protrusion and associated diffuse uncinate
spurring. Cord flattening. Posterior decompression

C6-7: Diffuse uncinate spurring with cord flattening and mild spinal
stenosis.

C7-T1: No significant stenosis.
IMPRESSION: Posterior hardware fusion C3 through C7. Posterior decompression C3
through C5. Small posterior fluid collection in the soft tissues may
represent postop fluid versus CSF leak.

Small area of cord hyperintensity at the C4 level is unchanged
compatible with chronic myelomalacia. No cord compression.

## 2017-01-20 DIAGNOSIS — I69334 Monoplegia of upper limb following cerebral infarction affecting left non-dominant side: Secondary | ICD-10-CM | POA: Diagnosis not present

## 2017-01-20 DIAGNOSIS — G40909 Epilepsy, unspecified, not intractable, without status epilepticus: Secondary | ICD-10-CM | POA: Diagnosis not present

## 2017-01-20 DIAGNOSIS — M1991 Primary osteoarthritis, unspecified site: Secondary | ICD-10-CM | POA: Diagnosis not present

## 2017-01-20 DIAGNOSIS — I739 Peripheral vascular disease, unspecified: Secondary | ICD-10-CM | POA: Diagnosis not present

## 2017-01-20 DIAGNOSIS — E1165 Type 2 diabetes mellitus with hyperglycemia: Secondary | ICD-10-CM | POA: Diagnosis not present

## 2017-01-20 DIAGNOSIS — M62838 Other muscle spasm: Secondary | ICD-10-CM | POA: Diagnosis not present

## 2017-01-24 DIAGNOSIS — E1165 Type 2 diabetes mellitus with hyperglycemia: Secondary | ICD-10-CM | POA: Diagnosis not present

## 2017-01-24 DIAGNOSIS — G40909 Epilepsy, unspecified, not intractable, without status epilepticus: Secondary | ICD-10-CM | POA: Diagnosis not present

## 2017-01-24 DIAGNOSIS — I69334 Monoplegia of upper limb following cerebral infarction affecting left non-dominant side: Secondary | ICD-10-CM | POA: Diagnosis not present

## 2017-01-24 DIAGNOSIS — I739 Peripheral vascular disease, unspecified: Secondary | ICD-10-CM | POA: Diagnosis not present

## 2017-01-24 DIAGNOSIS — M1991 Primary osteoarthritis, unspecified site: Secondary | ICD-10-CM | POA: Diagnosis not present

## 2017-01-24 DIAGNOSIS — M62838 Other muscle spasm: Secondary | ICD-10-CM | POA: Diagnosis not present

## 2017-01-26 DIAGNOSIS — G40909 Epilepsy, unspecified, not intractable, without status epilepticus: Secondary | ICD-10-CM | POA: Diagnosis not present

## 2017-01-26 DIAGNOSIS — I739 Peripheral vascular disease, unspecified: Secondary | ICD-10-CM | POA: Diagnosis not present

## 2017-01-26 DIAGNOSIS — M62838 Other muscle spasm: Secondary | ICD-10-CM | POA: Diagnosis not present

## 2017-01-26 DIAGNOSIS — M1991 Primary osteoarthritis, unspecified site: Secondary | ICD-10-CM | POA: Diagnosis not present

## 2017-01-26 DIAGNOSIS — I69334 Monoplegia of upper limb following cerebral infarction affecting left non-dominant side: Secondary | ICD-10-CM | POA: Diagnosis not present

## 2017-01-26 DIAGNOSIS — E1165 Type 2 diabetes mellitus with hyperglycemia: Secondary | ICD-10-CM | POA: Diagnosis not present

## 2017-01-31 DIAGNOSIS — M62838 Other muscle spasm: Secondary | ICD-10-CM | POA: Diagnosis not present

## 2017-01-31 DIAGNOSIS — I739 Peripheral vascular disease, unspecified: Secondary | ICD-10-CM | POA: Diagnosis not present

## 2017-01-31 DIAGNOSIS — E1165 Type 2 diabetes mellitus with hyperglycemia: Secondary | ICD-10-CM | POA: Diagnosis not present

## 2017-01-31 DIAGNOSIS — I69334 Monoplegia of upper limb following cerebral infarction affecting left non-dominant side: Secondary | ICD-10-CM | POA: Diagnosis not present

## 2017-01-31 DIAGNOSIS — G40909 Epilepsy, unspecified, not intractable, without status epilepticus: Secondary | ICD-10-CM | POA: Diagnosis not present

## 2017-01-31 DIAGNOSIS — M1991 Primary osteoarthritis, unspecified site: Secondary | ICD-10-CM | POA: Diagnosis not present

## 2017-02-02 DIAGNOSIS — I739 Peripheral vascular disease, unspecified: Secondary | ICD-10-CM | POA: Diagnosis not present

## 2017-02-02 DIAGNOSIS — E1165 Type 2 diabetes mellitus with hyperglycemia: Secondary | ICD-10-CM | POA: Diagnosis not present

## 2017-02-02 DIAGNOSIS — G40909 Epilepsy, unspecified, not intractable, without status epilepticus: Secondary | ICD-10-CM | POA: Diagnosis not present

## 2017-02-02 DIAGNOSIS — M1991 Primary osteoarthritis, unspecified site: Secondary | ICD-10-CM | POA: Diagnosis not present

## 2017-02-02 DIAGNOSIS — M62838 Other muscle spasm: Secondary | ICD-10-CM | POA: Diagnosis not present

## 2017-02-02 DIAGNOSIS — I69334 Monoplegia of upper limb following cerebral infarction affecting left non-dominant side: Secondary | ICD-10-CM | POA: Diagnosis not present

## 2017-02-08 ENCOUNTER — Ambulatory Visit (INDEPENDENT_AMBULATORY_CARE_PROVIDER_SITE_OTHER): Payer: Medicare Other | Admitting: Neurology

## 2017-02-08 ENCOUNTER — Encounter (INDEPENDENT_AMBULATORY_CARE_PROVIDER_SITE_OTHER): Payer: Self-pay

## 2017-02-08 ENCOUNTER — Encounter: Payer: Self-pay | Admitting: Neurology

## 2017-02-08 VITALS — BP 141/83 | HR 88 | Ht 64.0 in | Wt 162.0 lb

## 2017-02-08 DIAGNOSIS — G8114 Spastic hemiplegia affecting left nondominant side: Secondary | ICD-10-CM | POA: Diagnosis not present

## 2017-02-08 MED ORDER — INCOBOTULINUMTOXINA 100 UNITS IM SOLR
500.0000 [IU] | INTRAMUSCULAR | Status: DC
  Administered 2017-02-08: 500 [IU] via INTRAMUSCULAR

## 2017-02-08 NOTE — Progress Notes (Signed)
Chief Complaint  Patient presents with  . Left Spastic Hemiplegia    [name] 100 units x 5 vials - office supply      PATIENT: Charles [name] DOB: 1955/10/03  HISTORICAL  Charles [name] is a 61 years old right-handed male, accompanied by his mother, and his brother, interpreter, for EMG guided Botox injection for his spastic left hemiparesis,I saw him first in Nov 2015, he was previously patients of Dr. Leonie Man, and Dr. Janann Colonel, who has performed EMG guided Botox injection in January, April, July 2015, for his spastic left hemiparesis, which he responded very well  He was born deaf, He had a history of pulmonary sarcoidosis, was treated with long-term low-dose steroid, also with past medical history of hypertension, diabetes, hyperlipidemia,  He suffered stroke in April 2014, was treated at [place], with residual severe left hemiparesis, per record  MRI of the brain showed remote right hemispheric infarct and small vessel disease type changes  MRA of brain abrupt  cutoff of flow at the right carotid terminus with nonvisualization of right middle cerebral artery branch vessels consistent with the patient's remote right hemispheric infarct. Fetal type origin of the posterior cerebral arteries with posterior cerebral artery supplied predominately from the anterior circulation. 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   2D Echocardiogram 60%, wall motion normal, LA normal size Carotid Doppler Bilateral: 1-39% ICA stenosis. Vertebral artery flow is antegrade  TEE no PFO, Pulm AVM  EEG in August 2014, normal  awake and asleep EEG. No focal or generalized epileptiform discharges noted   UPDATE May 8th 2017: He is with his mother and brother at today's clinical visit, last visit was November 25th 2015 for EMG guided Botox injection to his spastic left upper and lower extremity, he denies significant improvement with Botox injection.  Today he came in with a new  issue, complains a year history of intermittent right elbow discomfort, radiating pain to right arm, right hand, mainly involving right fifths, and fourth fingers, it happened with prolonged sitting, eating, or sleeping, he also noticed mild right hand weakness, difficulty to unscrew the bottle top. 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 He complains progressive worsening gait abnormality, falling few times, neck pain, low back pain, radiating pain to his right leg  UPDATE August 24 2015: He is accompanied by his mother brother and interpreter at today's clinical visit, he continue complains of low back pain radiating pain to right lower extremity, intermittent right arm hands paresthesia, worsening gait difficulty, urinary urgency,  We have personally reviewed MRI of cervical spine in June 2017: There is severe spinal stenosis at C3-C4 and at C4-C5 due to central disc protrusions/herniations, uncovertebral spurring, facet hypertrophy and congenitally short pedicles. There is subtle hyperintense signal within the spinal cord on sagittal STIR images just below the C3-C4 interspace but this is not confirmed on axial images. This could represent a mild cervical myelopathy. 2. There is moderate spinal stenosis at C5-C6 and C6-C7 mild spinal stenosis at C2-C3 to degenerative changes and congenitally short pedicles. 3. At C3-C4 there is moderately severe bilateral foraminal narrowing at could lead to impingement either of the C4 nerve roots. There does not appear to be nerve root  impingement at the other cervical levels  MRI of lumbar spine in June 2017: At L4-L5, there is broad disc bulging, moderately severe facet  hypertrophy, right greater than left, and minimal anterolisthesis. There is no nerve root compression at this level. When compared to the MRI dated 12/24/2012, the synovial cyst that was noted to the left on the prior MRI is no longer present. This relieves the left L5 nerve root impingement that was noted at that time. However, since the prior MRI there has been mild progression of the facet hypertrophy and minimal anterolisthesis . 2. There are milder degenerative changes at L3-L4 and L5-S1 that did not lead to any nerve root impingement, unchanged when compared to the previous MRI.  UPDATE Dec 03 2015: He had cervical decompression laminectomy C3-4-5 6 with foraminotomies of the C4-5 VI nerve roots by Dr. Saintclair Halsted on October 07 2015, patient reported significant improvement in his neck pain, family noticed he has difficulty sleeping, agitated.  I personally reviewed MRI cervical spine in September 2017: Posterior hardware fusion C3 through C7. Posterior decompression C3 through C5. Small posterior fluid collection in the soft tissues may represent postop fluid versus CSF leak. Small area of cord hyperintensity at the C4 level is unchanged compatible with chronic myelomalacia. No cord compression.  He complains of low back pain, left that time pain, had personally reviewed MRI of left femur there was no significant abnormality notice, MRI lumbar in June 2017, multilevel degenerative changes, but there was no significant canal or foraminal stenosis   UPDATE Jan 16th 2018: He complains of neck pain, using heating pad, stiff, limited range of motion, lean back to get relief, difficulty to get a comfortable position to sleep, he lost drip of his right hand,  Left leg pain when bearing weight, left leg tightness,  We have personally reviewed MRI of left leg showed no significant abnormality, MRI of lumbar in June 2017 showed mild degenerative disc disease but no significant canal or foraminal  stenosis CAT scan of the brain in August 2017 showed large right MCA stroke  Update Jul 05 2016: He complains of worsening left-sided low back pain, unsteady gait, we have personally reviewed MRI of lumbar in June 2017, only mild degenerative changes, there is no significant canal or foraminal stenosis.  He is taking Mobic/naproxen as needed, worsening spastic left hemiparesis, left hands in position   UPDATE June 5th 2018: He returned for a electronic stimulation guided xeomin injection for spastic left hemiparesis, was emphasize on left upper extremity today,  UPDATE Feb 08 2017: He did respond some to previous xeomin injection 500 units, still has significant left arm spasticity, weakness, left anterior thigh muscle spasm in the achy pain  REVIEW OF SYSTEMS: Full 14 system review of systems performed and notable only for as above ALLERGIES: Allergies  Allergen Reactions  . Shellfish Allergy Hives and Swelling    SWELLING REACTION UNSPECIFIED   . Hydrocodone Other (See Comments)    Makes "looney"  . Contrast Media [Iodinated Diagnostic Agents] Itching  . Oxycodone Nausea And Vomiting    HOME MEDICATIONS: Current Outpatient Medications on File Prior to Visit  Medication Sig Dispense Refill  . albuterol (PROAIR HFA) 108 (90 Base) MCG/ACT inhaler Inhale 2 puffs into the lungs every 6 (six) hours as needed for wheezing or shortness of breath.    [name] aspirin 325 MG EC tablet Take 325 mg by mouth daily.    [name] atorvastatin (LIPITOR) 10 MG tablet Take 10 mg by mouth  daily. (cholesterol)    . cholecalciferol (VITAMIN D) 1000 units tablet Take 1,000 Units by mouth daily.    [name] escitalopram (LEXAPRO) 5 MG tablet Take 5 mg by mouth daily.     [name] esomeprazole (NEXIUM) 40 MG capsule Take 1 capsule (40 mg total) by mouth 2 (two) times daily. (Patient taking differently: Take 40 mg by mouth daily. ) 60 capsule 0  . gabapentin (NEURONTIN) 300 MG capsule Take 1 capsule (300 mg total) by mouth 3  (three) times daily. 90 capsule 11  . glucose blood (ONETOUCH VERIO) test strip 1 each by Other route daily. And lancets 1/day 100 each 3  . hydrochlorothiazide (MICROZIDE) 12.5 MG capsule Take 12.5 mg by mouth daily.    [name] levETIRAcetam (KEPPRA) 500 MG tablet Take 1 tablet (500 mg total) by mouth 2 (two) times daily. 60 tablet 0  . losartan (COZAAR) 50 MG tablet Take 50 mg by mouth daily.    . Melatonin 5 MG TABS Take 5 mg by mouth at bedtime.    . mometasone-formoterol (DULERA) 100-5 MCG/ACT AERO Inhale 2 puffs into the lungs every 12 (twelve) hours. 1 Inhaler 11  . naproxen (NAPROSYN) 375 MG tablet Take 375 mg by mouth 2 (two) times daily with a meal.    . oxybutynin (DITROPAN XL) 15 MG 24 hr tablet Take 15 mg by mouth at bedtime.    . predniSONE (DELTASONE) 1 MG tablet Take 1 mg by mouth daily with breakfast.    . tiotropium (SPIRIVA HANDIHALER) 18 MCG inhalation capsule Place 18 mcg into inhaler and inhale daily.     [name] tiZANidine (ZANAFLEX) 2 MG tablet Take 1 tablet (2 mg total) by mouth 3 (three) times daily. 60 tablet 6   Current Facility-Administered Medications on File Prior to Visit  Medication Dose Route Frequency Provider Last Rate Last Dose  . incobotulinumtoxinA ([name]) 100 units injection 500 Units  500 Units Intramuscular Q90 days Marcial Pacas, MD   500 Units at 11/02/16 1406    PAST MEDICAL HISTORY: Past Medical History:  Diagnosis Date  . Anemia   . Asthma   . Deaf   . Essential hypertension   . GERD (gastroesophageal reflux disease)   . History of migraine   . History of stroke    Spastic hemiplegia, right MCA stroke April 2014 in [place]  . Mixed hyperlipidemia   . Other pancytopenia ([place]) 12/23/2015  . PAD (peripheral artery disease) ([place])   . Sarcoidosis   . Seizures ([place])   . Type 2 diabetes mellitus ([place])     PAST SURGICAL HISTORY: Past Surgical History:  Procedure Laterality Date  . BRAVO PH STUDY N/A 06/18/2013   pH STUDY SHOWS NEXIUM TWICE DAILY  CONTROLS THE ACID IN HIS STOMACH. HE HAD VERY FEWEPISODE OF REGURGITATION RECORDED IN THE 2 DAYS THE STUDY WAS PERFORMED.   [name] COLONOSCOPY  2011   IN [place]  . 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   . ESOPHAGOGASTRODUODENOSCOPY N/A 05/05/2015   Procedure: ESOPHAGOGASTRODUODENOSCOPY (EGD);  Surgeon: Danie Binder, MD;  Location: [facility];  Service: Endoscopy;  Laterality: N/A;  730  . MALONEY DILATION N/A 06/18/2013   Procedure: MALONEY DILATION;  Surgeon: Danie Binder, MD;  Location: [facility];  Service:  Endoscopy;  Laterality: N/A;  . OTHER SURGICAL HISTORY     cyst removal head and chest  . POSTERIOR CERVICAL FUSION/FORAMINOTOMY N/A 10/07/2015   Procedure: Cervical Three-Four, Cervical Four-Five, Cervical Five-Six, Cervical Six-Seven Posterior Cervical Laminectomy and Fusion;  Surgeon: Kary Kos, MD;  Location: [facility];  Service: Neurosurgery;  Laterality: N/A;  . SAVORY DILATION N/A 06/18/2013   Procedure: SAVORY DILATION;  Surgeon: Danie Binder, MD;  Location: [facility];  Service: Endoscopy;  Laterality: N/A;  . TEE WITHOUT CARDIOVERSION N/A 10/04/2012   Procedure: TRANSESOPHAGEAL ECHOCARDIOGRAM (TEE);  Surgeon: Thayer Headings, MD;  Location[facility] ENDOSCOPY;  Service: Cardiovascular;  Laterality: N/A;  . [place] accident  Both legs fractured and repaired surgically    FAMILY HISTORY: Family History  Problem Relation Age of Onset  . Diabetes Mother   . Hypertension Mother   . Diabetes Father   . Hypertension Father   . Colon polyps Neg Hx   . Colon cancer Neg Hx     SOCIAL HISTORY:  Social History   Socioeconomic History  . Marital  status: Single    Spouse name: Not on file  . Number of children: 0  . Years of education: HS  . Highest education level: Not on file  Social Needs  . Financial resource strain: Not on file  . Food insecurity - worry: Not on file  . Food insecurity - inability: Not on file  . Transportation needs - medical: Not on file  . Transportation needs - non-medical: Not on file  Occupational History  . Occupation: Disabled  Tobacco Use  . Smoking status: Former Smoker    Packs/day: 1.00    Years: 29.00    Pack years: 29.00    Types: Cigarettes    Last attempt to quit: 02/22/1992    Years since quitting: 24.9  . Smokeless tobacco: Never Used  Substance and Sexual Activity  . Alcohol use: Yes    Alcohol/week: 0.6 - 1.2 oz    Types: 1 - 2 Cans of beer per week    Comment: occasionally, every 2-3 days  . Drug use: Yes    Types: Marijuana, "Crack" cocaine    Comment: hx of crack cocaine "long time ago" per pt - hard to tell how long it is has been since he used  . Sexual activity: No  Other Topics Concern  . Not on file  Social History Narrative   Patient lives at home with his family. Brother, sister, mother    Patient does not work.    Patient has a high school education.    Patient has one step child      PHYSICAL EXAM   Vitals:   02/08/17 1314  BP: (!) 141/83  Pulse: 88  Weight: 162 lb (73.5 kg)  Height: 5\' 4"  (1.626 m)    Not recorded      Body mass index is 27.81 kg/m.   PHYSICAL EXAMNIATION:  Gen: NAD, conversant, well nourised, obese, well groomed                     Cardiovascular: Regular rate rhythm, no peripheral edema, warm, nontender. Eyes: Conjunctivae clear without exudates or hemorrhage Neck: Supple, no carotid bruise. Pulmonary: Clear to auscultation bilaterally   NEUROLOGICAL EXAM:  MENTAL STATUS: Speech: He is mute depend on interpreter for history,   CRANIAL NERVES: CN II: Visual fields are full to confrontation. Fundoscopic exam is normal  with sharp discs and no vascular changes. Pupils are round equal and briskly reactive to light. CN III, IV, VI: extraocular movement are normal. No ptosis. CN V: Facial sensation is intact to pinprick in all 3 divisions bilaterally. Corneal responses are intact.  CN VII: Face is symmetric with normal eye closure and smile. CN VIII: deaf CN IX, X: Palate elevates symmetrically. Phonation is normal. CN XI: Head turning and shoulder shrug are intact CN XII: Tongue is midline with normal movements and no atrophy.  MOTOR: Spastic left hemiparesis, wearing left ankle brace, mild left shoulder antigravity movement left shoulder abduction with passive movement maximum 90, left elbow 170, complains of left elbow pain with passive movement, left thumb in position, left hip flexion 3, left knee extension 4, left knee flexion 4, he also has mild right ankle dorsiflexion weakness.  He has right elbow Tinel signs, mild right finger abduction weakness, mild right hand grip weakness  REFLEXES:  Hyperreflexia of both side, worse on the left side, bilateral Babinski sign.  SENSORY: Intact to light touch, pinprick, positional and vibratory sensation are intact in fingers and toes, with exception of decreased light touch in the right fifth, and half of right fourth finger extending to right ulnar palmar, dorsum of right hand  COORDINATION: There is no dysmetria on right finger-to-nose and heel-knee-shin.    GAIT/STANCE: Rely on his 4 foot cane, multiple tends to get up from seated position, left hemi-circumferential, unsteady gait      Lab Results  Component Value Date   WBC 4.3 09/13/2016   HGB 13.6 09/13/2016   HCT 39.2 09/13/2016   MCV 87.7 09/13/2016   PLT 106 (L) 09/13/2016      Component Value Date/Time   NA 137 09/13/2016 0743   K 3.8 09/13/2016 0743   CL 100 (L) 09/13/2016 0743   CO2 30 09/13/2016 0743   GLUCOSE 117 (H) 09/13/2016 0743   BUN 13 09/13/2016 0743   CREATININE 1.08  09/13/2016 0743   CALCIUM 8.5 (L) 09/13/2016 0743   PROT 7.2 09/12/2016 1428   ALBUMIN 4.2 09/12/2016 1428   AST 27 09/12/2016 1428   ALT 25 09/12/2016 1428   ALKPHOS 60 09/12/2016 1428   BILITOT 0.6 09/12/2016 1428   GFRNONAA >60 09/13/2016 0743   GFRAA >60 09/13/2016 0743   Lab Results  Component Value Date   CHOL 160 09/13/2016   HDL 59 09/13/2016   LDLCALC 43 09/13/2016   TRIG 288 (H) 09/13/2016   CHOLHDL 2.7 09/13/2016   Lab Results  Component Value Date   HGBA1C 5.8 (H) 09/12/2016   Lab Results  Component Value Date   VITAMINB12 405 09/12/2016   Lab Results  Component Value Date   TSH 2.679 09/12/2016      ASSESSMENT AND PLAN  Charles [name] is a 61 y.o. male    Cervical spondylitic myelopathy  Status post cervical decompression surgery in August 2017, Continued to complains of neck pain  I have encouraged him neck stretching exercise, hot compression  Continue gabapentin 300 mg 3 times a day  Tizanidine 2 mg 3 times a day as needed,  Tramadol, Mobic as needed for pain  Keep Cymbalta 60 mg daily  Return to clinic in 4 week  Low back pain, left anterior thigh and hip pain, gait abnormality  Musculoskeletal etiology  Continue hot compression  Mobic 7.5 milligrams twice a day,  Tramadol as needed  Refer him to physical therapy  Congenital deaf, use sign language, history  is through interpreter   History of stroke, with residual spastic left hemiparesis  electrical stimulation guided xeomin injection, used 500 units  Left brachialis 100 units Left pronator teres 75 units Left palmaris longus 25 units  Left flexor digitorum superficialis 25 units Left flexor digitorum profundus right 25 units Left opponens 25 units Left lumbricals 25 units Left flexor carpi ulnaris 50 units Left flexor carpi radialis 50 units  Left pectoralis major 50 units  Left rectus femoris 25 units Left vastus medialis 25 units Left adductor longus 50 units    He  will return to clinic in 3 months for repeat injection  Marcial Pacas, M.D. Ph.D.  [facility] [address] [place], [place] [address] [phone]

## 2017-02-08 NOTE — Progress Notes (Signed)
**  Botox 100 units x 5 vials, NDC 0259-1610-01, Lot [id number], Exp 01/2019, office supply.//mck,rn**

## 2017-02-23 ENCOUNTER — Ambulatory Visit: Payer: Medicare Other | Admitting: Cardiology

## 2017-03-01 DIAGNOSIS — Z7984 Long term (current) use of oral hypoglycemic drugs: Secondary | ICD-10-CM | POA: Diagnosis not present

## 2017-03-01 DIAGNOSIS — Z79899 Other long term (current) drug therapy: Secondary | ICD-10-CM | POA: Diagnosis not present

## 2017-03-01 DIAGNOSIS — M542 Cervicalgia: Secondary | ICD-10-CM | POA: Diagnosis not present

## 2017-03-01 DIAGNOSIS — I10 Essential (primary) hypertension: Secondary | ICD-10-CM | POA: Diagnosis not present

## 2017-03-01 DIAGNOSIS — E119 Type 2 diabetes mellitus without complications: Secondary | ICD-10-CM | POA: Diagnosis not present

## 2017-03-01 DIAGNOSIS — R569 Unspecified convulsions: Secondary | ICD-10-CM | POA: Diagnosis not present

## 2017-03-01 DIAGNOSIS — R21 Rash and other nonspecific skin eruption: Secondary | ICD-10-CM | POA: Diagnosis not present

## 2017-03-01 DIAGNOSIS — Z87891 Personal history of nicotine dependence: Secondary | ICD-10-CM | POA: Diagnosis not present

## 2017-03-01 DIAGNOSIS — I69934 Monoplegia of upper limb following unspecified cerebrovascular disease affecting left non-dominant side: Secondary | ICD-10-CM | POA: Diagnosis not present

## 2017-03-04 ENCOUNTER — Other Ambulatory Visit: Payer: Self-pay

## 2017-03-04 ENCOUNTER — Emergency Department (HOSPITAL_COMMUNITY)
Admission: EM | Admit: 2017-03-04 | Discharge: 2017-03-04 | Disposition: A | Discharge status: Home / Self Care | Payer: Medicare Other | Attending: Emergency Medicine | Admitting: Emergency Medicine

## 2017-03-04 ENCOUNTER — Emergency Department (HOSPITAL_COMMUNITY): Payer: Medicare Other

## 2017-03-04 ENCOUNTER — Encounter (HOSPITAL_COMMUNITY): Payer: Self-pay

## 2017-03-04 DIAGNOSIS — Z8673 Personal history of transient ischemic attack (TIA), and cerebral infarction without residual deficits: Secondary | ICD-10-CM | POA: Diagnosis not present

## 2017-03-04 DIAGNOSIS — J449 Chronic obstructive pulmonary disease, unspecified: Secondary | ICD-10-CM | POA: Diagnosis not present

## 2017-03-04 DIAGNOSIS — E119 Type 2 diabetes mellitus without complications: Secondary | ICD-10-CM | POA: Diagnosis not present

## 2017-03-04 DIAGNOSIS — R52 Pain, unspecified: Secondary | ICD-10-CM | POA: Diagnosis not present

## 2017-03-04 DIAGNOSIS — Z79899 Other long term (current) drug therapy: Secondary | ICD-10-CM | POA: Diagnosis not present

## 2017-03-04 DIAGNOSIS — E782 Mixed hyperlipidemia: Secondary | ICD-10-CM | POA: Diagnosis not present

## 2017-03-04 DIAGNOSIS — M542 Cervicalgia: Secondary | ICD-10-CM | POA: Insufficient documentation

## 2017-03-04 DIAGNOSIS — I10 Essential (primary) hypertension: Secondary | ICD-10-CM | POA: Diagnosis not present

## 2017-03-04 DIAGNOSIS — Z7982 Long term (current) use of aspirin: Secondary | ICD-10-CM | POA: Diagnosis not present

## 2017-03-04 DIAGNOSIS — Z87891 Personal history of nicotine dependence: Secondary | ICD-10-CM | POA: Insufficient documentation

## 2017-03-04 LAB — CBC WITH DIFFERENTIAL/PLATELET
Basophils Absolute: 0 10*3/uL (ref 0.0–0.1)
Basophils Relative: 0 %
EOS ABS: 0.1 10*3/uL (ref 0.0–0.7)
EOS PCT: 1 %
HCT: 43.5 % (ref 39.0–52.0)
Hemoglobin: 15 g/dL (ref 13.0–17.0)
LYMPHS ABS: 1.1 10*3/uL (ref 0.7–4.0)
Lymphocytes Relative: 21 %
MCH: 30.5 pg (ref 26.0–34.0)
MCHC: 34.5 g/dL (ref 30.0–36.0)
MCV: 88.4 fL (ref 78.0–100.0)
MONO ABS: 0.3 10*3/uL (ref 0.1–1.0)
Monocytes Relative: 6 %
NEUTROS PCT: 72 %
Neutro Abs: 3.7 10*3/uL (ref 1.7–7.7)
PLATELETS: 125 10*3/uL — AB (ref 150–400)
RBC: 4.92 MIL/uL (ref 4.22–5.81)
RDW: 12.8 % (ref 11.5–15.5)
WBC: 5.2 10*3/uL (ref 4.0–10.5)

## 2017-03-04 LAB — BASIC METABOLIC PANEL
Anion gap: 11 (ref 5–15)
BUN: 8 mg/dL (ref 6–20)
CALCIUM: 9.1 mg/dL (ref 8.9–10.3)
CO2: 28 mmol/L (ref 22–32)
CREATININE: 0.89 mg/dL (ref 0.61–1.24)
Chloride: 99 mmol/L — ABNORMAL LOW (ref 101–111)
GFR calc non Af Amer: >60 mL/min (ref >60)
Glucose, Bld: 122 mg/dL — ABNORMAL HIGH (ref 65–99)
Potassium: 3.6 mmol/L (ref 3.5–5.1)
Sodium: 138 mmol/L (ref 135–145)

## 2017-03-04 LAB — SEDIMENTATION RATE: Sed Rate: 2 mm/hr (ref 0–16)

## 2017-03-04 MED ORDER — GUAIFENESIN-DM 100-10 MG/5ML PO SYRP: 5.0000 mL | ORAL_SOLUTION | Freq: Once | ORAL | Status: DC

## 2017-03-04 MED ORDER — DIAZEPAM 5 MG PO TABS: 5.0000 mg | ORAL_TABLET | Freq: Two times a day (BID) | ORAL | 0 refills | Status: DC

## 2017-03-04 MED ORDER — TRAMADOL HCL 50 MG PO TABS: 50.0000 mg | ORAL_TABLET | Freq: Four times a day (QID) | ORAL | 0 refills | Status: DC | PRN

## 2017-03-04 MED ORDER — PREDNISONE 10 MG PO TABS: 40.0000 mg | ORAL_TABLET | Freq: Every day | ORAL | 0 refills | Status: DC

## 2017-03-04 MED ORDER — METHOCARBAMOL 500 MG PO TABS
750.0000 mg | ORAL_TABLET | Freq: Once | ORAL | Status: AC
  Administered 2017-03-04: 750 mg via ORAL
  Filled 2017-03-04: qty 2

## 2017-03-04 MED ORDER — DIAZEPAM 5 MG PO TABS
5.0000 mg | ORAL_TABLET | Freq: Once | ORAL | Status: AC
  Administered 2017-03-04: 5 mg via ORAL
  Filled 2017-03-04: qty 1

## 2017-03-04 MED ORDER — TRAMADOL HCL 50 MG PO TABS
50.0000 mg | ORAL_TABLET | Freq: Once | ORAL | Status: AC
  Administered 2017-03-04: 50 mg via ORAL
  Filled 2017-03-04: qty 1

## 2017-03-04 MED ORDER — HYDROCHLOROTHIAZIDE 25 MG PO TABS: 12.5000 mg | ORAL_TABLET | Freq: Every day | ORAL | Status: DC

## 2017-03-04 MED ORDER — LOSARTAN POTASSIUM 25 MG PO TABS: 50.0000 mg | ORAL_TABLET | Freq: Every day | ORAL | Status: DC

## 2017-03-04 MED ORDER — PREDNISONE 50 MG PO TABS
60.0000 mg | ORAL_TABLET | Freq: Once | ORAL | Status: AC
  Administered 2017-03-04: 11:00:00 60 mg via ORAL
  Filled 2017-03-04: qty 1

## 2017-03-04 NOTE — ED Provider Notes (Signed)
[facility] EMERGENCY DEPARTMENT Provider Note   CSN: [id number] Arrival date & time: 03/04/17  0847     History   Chief Complaint Chief Complaint  Patient presents with  . Neck Pain    HPI Charles [name] is a 62 y.o. 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  History is limited due to the patient's deafness.  He is able to verbalize some of his complaints as well as write on a piece of paper. He states over the last 10 days he has been having pain intermittently that is sharp without radiation. He has had a cervical fusion in the past.   After some time the family arrived.  They state that the patient has been complaining of right-sided neck pain as sharp and will wake him at night.  There is no radiation.  He was seen at [facility] ED in [place] for this where he had a CT scan done that was negative.  Patient was discharged home on triamcinolone he has not been taking the hydrocodone as he has a allergy to this.  He is presenting for continued pain.  Denies fever, headache, facial droop, new weakness, vertigo, numbness/tingling.  HPI  Past Medical History:  Diagnosis Date  . Anemia   . Asthma   . Deaf   . Essential hypertension   . GERD (gastroesophageal reflux disease)   . History of migraine   . History of stroke    Spastic hemiplegia, right MCA stroke April 2014 in [place]  . Mixed hyperlipidemia   . Other pancytopenia ([place]) 12/23/2015  . PAD (peripheral artery disease) ([place])   . Sarcoidosis   . Seizures ([place])   . Type 2 diabetes mellitus [facility])     Patient Active Problem List   Diagnosis Date Noted  . Thrombocytopenia ([place]) 09/13/2016  . Hypoglycemia   . Syncope 09/12/2016  . Seizure disorder as sequela of cerebrovascular accident ([place]) 09/12/2016  . MDD (major depressive disorder), recurrent episode, severe  ([place]) 12/30/2015  . Suicidal ideation   . Other pancytopenia ([place]) 12/23/2015  . Loss of weight 11/12/2015  . 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 Iron deficiency anemia 09/22/2015  . Spinal stenosis in cervical region 08/21/2015  . Neck pain 08/12/2015  . Low back pain 08/12/2015  . Abnormality of gait 08/12/2015  . Neuropathy of right upper extremity 06/29/2015  . Acute rhinitis 05/05/2015  . Abdominal pain, epigastric 04/23/2015  . Arm pain, right 04/23/2015  . Atypical chest pain 07/23/2013  . 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 Dyspepsia 06/13/2013  . Dysphagia 06/13/2013  . Headache 11/30/2012  . Solitary pulmonary nodule 11/20/2012  . Spastic hemiplegia affecting left nondominant side ([place]) 11/08/2012  . Essential hypertension, benign 10/12/2012  . COPD GOLD II with reversibility  10/12/2012  . 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 CVA (cerebral vascular accident) ([place]) 10/03/2012  . Diabetes ([place]) 10/03/2012  . Sarcoidosis ([place]) 10/03/2012    Past Surgical History:  Procedure Laterality Date  . BRAVO PH STUDY N/A 06/18/2013   pH STUDY SHOWS NEXIUM TWICE DAILY CONTROLS THE ACID IN HIS STOMACH. HE HAD VERY FEWEPISODE OF REGURGITATION RECORDED IN THE 2 DAYS THE STUDY WAS PERFORMED.   [name] COLONOSCOPY  2011   [place]  . ESOPHAGOGASTRODUODENOSCOPY N/A 06/18/2013   Dr.Fields- probable proximal esophageal web,dilation performed, bravo cap placed, mild non-erosive gastritis in  the gastric antrum and on the greater curvature of the gastric body bx- granulomatous gastritis, duodenal mucosa showed no abnormalities in the bulb and second portion of the duodenum.   . ESOPHAGOGASTRODUODENOSCOPY N/A 05/05/2015   Procedure: ESOPHAGOGASTRODUODENOSCOPY (EGD);  Surgeon: Danie Binder, MD;  [facility];  Service: Endoscopy;  Laterality: N/A;  730  . MALONEY DILATION N/A 06/18/2013   Procedure: MALONEY DILATION;  Surgeon: Danie Binder, MD;  Location: [facility];   Service: Endoscopy;  Laterality: N/A;  . OTHER SURGICAL HISTORY     cyst removal head and chest  . POSTERIOR CERVICAL FUSION/FORAMINOTOMY N/A 10/07/2015   Procedure: Cervical Three-Four, Cervical Four-Five, Cervical Five-Six, Cervical Six-Seven Posterior Cervical Laminectomy and Fusion;  Surgeon: Kary Kos, MD;  Location: [facility];  Service: Neurosurgery;  Laterality: N/A;  . SAVORY DILATION N/A 06/18/2013   Procedure: SAVORY DILATION;  Surgeon: Danie Binder, MD;  Location[facility];  Service: Endoscopy;  Laterality: N/A;  . TEE WITHOUT CARDIOVERSION N/A 10/04/2012   Procedure: TRANSESOPHAGEAL ECHOCARDIOGRAM (TEE);  Surgeon: Thayer Headings, MD;  Location: [facility];  Service: Cardiovascular;  Laterality: N/A;  . [place] accident  Both legs fractured and repaired surgically       Home Medications    Prior to Admission medications   Medication Sig Start Date End Date Taking? Authorizing Provider  albuterol (PROAIR HFA) 108 (90 Base) MCG/ACT inhaler Inhale 2 puffs into the lungs every 6 (six) hours as needed for wheezing or shortness of breath.    [provider]  aspirin 325 MG EC tablet Take 325 mg by mouth daily.    [provider]  atorvastatin (LIPITOR) 10 MG tablet Take 10 mg by mouth daily. (cholesterol)    [provider]  cholecalciferol (VITAMIN D) 1000 units tablet Take 1,000 Units by mouth daily.    [provider]  escitalopram (LEXAPRO) 5 MG tablet Take 5 mg by mouth daily.     [provider]  esomeprazole (NEXIUM) 40 MG capsule Take 1 capsule (40 mg total) by mouth 2 (two) times daily. Patient taking differently: Take 40 mg by mouth daily.  04/17/15   Carmin Muskrat, MD  gabapentin (NEURONTIN) 300 MG capsule Take 1 capsule (300 mg total) by mouth 3 (three) times daily. 12/07/15   Marcial Pacas, MD  glucose blood (ONETOUCH VERIO) test strip 1 each by Other route daily. And lancets 1/day  06/03/16   Renato Shin, MD  hydrochlorothiazide (MICROZIDE) 12.5 MG capsule Take 12.5 mg by mouth daily.    [provider]  levETIRAcetam (KEPPRA) 500 MG tablet Take 1 tablet (500 mg total) by mouth 2 (two) times daily. 05/23/16   Rolland Porter, MD  losartan (COZAAR) 50 MG tablet Take 50 mg by mouth daily.    [provider]  Melatonin 5 MG TABS Take 5 mg by mouth at bedtime.    [provider]  mometasone-formoterol (DULERA) 100-5 MCG/ACT AERO Inhale 2 puffs into the lungs every 12 (twelve) hours. 02/09/16   Tanda Rockers, MD  naproxen (NAPROSYN) 375 MG tablet Take 375 mg by mouth 2 (two) times daily with a meal.    [provider]  oxybutynin (DITROPAN XL) 15 MG 24 hr tablet Take 15 mg by mouth at bedtime.    [provider]  predniSONE (DELTASONE) 1 MG tablet Take 1 mg by mouth daily with breakfast.    [provider]  tiotropium (  SPIRIVA HANDIHALER) 18 MCG inhalation capsule Place 18 mcg into inhaler and inhale daily.     [provider]  tiZANidine (ZANAFLEX) 2 MG tablet Take 1 tablet (2 mg total) by mouth 3 (three) times daily. 03/08/16   Marcial Pacas, MD    Family History Family History  Problem Relation Age of Onset  . Diabetes Mother   . Hypertension Mother   . Diabetes Father   . Hypertension Father   . Colon polyps Neg Hx   . Colon cancer Neg Hx     Social History Social History   Tobacco Use  . Smoking status: Former Smoker    Packs/day: 1.00    Years: 29.00    Pack years: 29.00    Types: Cigarettes    Last attempt to quit: 02/22/1992    Years since quitting: 25.0  . Smokeless tobacco: Never Used  Substance Use Topics  . Alcohol use: Yes    Alcohol/week: 0.6 - 1.2 oz    Types: 1 - 2 Cans of beer per week    Comment: occasionally, every 2-3 days  . Drug use: Yes    Types: Marijuana, "Crack" cocaine    Comment: hx of crack cocaine "long time ago" per pt - hard to tell how long it is has been since he used      Allergies   Shellfish allergy; Hydrocodone; Contrast media [iodinated diagnostic agents]; and Oxycodone   Review of Systems Review of Systems  All other systems reviewed and are negative.    Physical Exam Updated Vital Signs BP (S) (!) 179/114   Pulse 86   Temp 97.7 F (36.5 C) (Oral)   Resp 18   Ht 5' 4" (1.626 m)   Wt 73.5 kg (162 lb)   SpO2 97%   BMI 27.81 kg/m   Physical Exam  Constitutional: He appears well-developed and well-nourished.  HENT:  Head: Normocephalic and atraumatic.  Right Ear: External ear normal.  Left Ear: External ear normal.  Eyes: Conjunctivae are normal. Right eye exhibits no discharge. Left eye exhibits no discharge. No scleral icterus.  Pulmonary/Chest: Effort normal. No respiratory distress.  Musculoskeletal:  Cervical Spine: There is prior c-spine scar with that appears healed. No heat or erythema over this area.  No obvious bony deformity. No skin swelling, erythema, heat, fluctuance or break of the skin. No TTP over the cervical spinous processes. Right sided paraspinal tenderness. No step-offs. Patient is able to actively rotate their neck 45 degrees left and right voluntarily and flex and extend the neck without pain. He notes pain with left sided lateral bend and turning of the neck. Left Shoulder: Appearance normal. No obvious bony deformity. No skin swelling, erythema, heat, fluctuance or break of the skin. No clavicular deformity or TTP. No TTP. Passive flexion, extension, abduction, adduction, and internal/external rotation intact without pain or crepitus. Strength for flexion, extension, abduction, adduction, and internal/external rotation intact and appropriate for age.  Radial Pulse 2+. Cap refill <2 seconds. SILT for M/U/R distributions. Compartments soft.   Neurological: He is alert.  Patient is noted to be deaf. He is able with speech.  Follows commands. No facial droop. PERRLA. EOM intact. CN III-XII grossly intact.Left sided  weakness (from prior stroke).    Skin: No pallor.  Psychiatric: He has a normal mood and affect.  Nursing note and vitals reviewed.    ED Treatments / Results  Labs (all labs ordered are listed, but only abnormal results are displayed) Labs Reviewed  BASIC  METABOLIC PANEL - Abnormal; Notable for the following components:      Result Value   Chloride 99 (*)    Glucose, Bld 122 (*)    All other components within normal limits  CBC WITH DIFFERENTIAL/PLATELET - Abnormal; Notable for the following components:   Platelets 125 (*)    All other components within normal limits  SEDIMENTATION RATE    EKG  EKG Interpretation None       Radiology Dg Cervical Spine Complete  Result Date: 03/04/2017 CLINICAL DATA:  Neck pain for several days. Left-sided weakness due to previous stroke. EXAM: CERVICAL SPINE - COMPLETE 4+ VIEW COMPARISON:  None. FINDINGS: Pedicle rods and screws are seen from C3 through through C7. The cervical spine is only well seen to the mid C7 level. No fractures are seen. Minimal anterolisthesis of C4 versus C5 is stable since the CT scan from March 01, 2017. No acute malalignment. The neural foramina are not well assessed due to positioning. The odontoid process is not well assessed. IMPRESSION: Limited study with no acute abnormalities identified. The cervical spine is only visualized to the mid C7 level. Electronically Signed   By: Dorise Bullion III M.D   On: 03/04/2017 10:55    Procedures Procedures (including critical care time)  Medications Ordered in ED Medications  hydrochlorothiazide (HYDRODIURIL) tablet 12.5 mg (not administered)  losartan (COZAAR) tablet 50 mg (not administered)  methocarbamol (ROBAXIN) tablet 750 mg (750 mg Oral Given 03/04/17 1041)  diazepam (VALIUM) tablet 5 mg (5 mg Oral Given 03/04/17 1126)  traMADol (ULTRAM) tablet 50 mg (50 mg Oral Given 03/04/17 1126)  predniSONE (DELTASONE) tablet 60 mg (60 mg Oral Given 03/04/17 1126)      Initial Impression / Assessment and Plan / ED Course  I have reviewed the triage vital signs and the nursing notes.  Pertinent labs & imaging results that were available during my care of the patient were reviewed by me and considered in my medical decision making (see chart for details).  Clinical Course as of Mar 04 1629  Sat Mar 04, 2017  1315 I had an extensive discussion with the patient, his brother, and his mother, using an ASL language interpreter.  The patient has been evaluated extensively for ongoing neck pain which has been going on for an unknown period of time.  He is frustrated because he is still having pain, and that he used crack cocaine in the past.  Recently he has been seen by neurology, and a pain specialist who prescribed tramadol.  The pain specialist did not think there were additional treatments that he could offer.  He also saw his neurosurgeon a couple months ago and at that time there was no surgical treatment felt to be necessary.  Currently, at this time, the patient is resting comfortably after the initial treatment.  [EW]    Clinical Course User Index [EW] Daleen Bo, MD    62 year old male with prior CVA with spastic hemiplegia on the left presenting for 10 days of right-sided neck pain.  Patient does have a previous cervical fusion of the spine in the past.  History is how provided by family who states that pain is severe enough to wake the patient up at night.  He was seen at [facility] ED in [place] for this where he had a CT scan done that was negative.  Patient's records obtained.  CT was negative for any acute injuries.  Did show some spondylolysis.  Exam with right paraspinal tenderness  to palpation.  There is no signs of infection at this time.  Patient is without any new neurological symptoms and appears at baseline.  He is neurovascular intact.  X-rays obtained and are reassuring.    Patient was seen and discussed with Dr. Eulis Foster. Lab work also  obtained and reassuring.  Normal ESR and no elevated white blood cell count. Believe symptoms most likely musculoskeletal pain in nature that is multifactorial.  He was treated in the emergency department with tramadol, Valium and prednisone with relief of his symptoms.  Will discharge the patient home on this regimen.  He is to follow-up with his PCP as soon as possible to establish ongoing care management and possible referral to a pain specialist, physical therapy and possible evaluation by psychiatrist/therapist.  Return precautions discussed.  Family is in agreement with plan.  Patient appears safe for discharge.  Final Clinical Impressions(s) / ED Diagnoses   Final diagnoses:  Neck pain    ED Discharge Orders        Ordered    diazepam (VALIUM) 5 MG tablet  2 times daily     03/04/17 1426    traMADol (ULTRAM) 50 MG tablet  Every 6 hours PRN     03/04/17 1426    predniSONE (DELTASONE) 10 MG tablet  Daily     03/04/17 1426       Lorelle Gibbs 03/04/17 1638    Daleen Bo, MD 03/06/17 [phone]

## 2017-03-04 NOTE — ED Triage Notes (Signed)
Patient complains of neck pain since Wednesday. Was seen at [facility] given Flexeril and Hydrocodone but didn't take any. ALso has not been taking BP medications. Patient states pain started this morning again. Left side weakness is related to previous stroke.

## 2017-03-04 NOTE — Discharge Instructions (Signed)
You were seen here today for neck pain.  We reviewed your CT scan that was done at [facility] and was reassuring.  Your x-rays were reassuring today.  Your blood work was reassuring as well.  You are given medications in the department that help you with your symptoms.  I am sending you home on the same medications.  Please take them as prescribed.  Please follow-up with your primary care doctor to discuss further care if your symptoms.  Please discuss things such as physical therapy. If you develop worsening or new concerning symptoms you can return to the emergency department for re-evaluation.

## 2017-03-04 NOTE — ED Provider Notes (Signed)
  Face-to-face evaluation   History: He presents for evaluation of neck pain, which started spontaneously several days ago and worsened when he was evaluated, and had imaging, 3 days ago.  He indicated to his family members that his neck "popped," then, aggravating the pain which was already there.  No other recent trauma.  He was apparently treated with hydrocodone which she is taking, without relief.  His wife reports that he is unable to sleep at night because of the neck pain, and she is unable to help him, with symptomatic treatments.  Physical exam: Elderly, irritable.  Alert.  Neck is exquisitely tender right sided, and he resists looking to the right.  Normal range of motion right arm.  Right arm, weak, with spasticity.    Clinical Course as of Mar 05 1315  Sat Mar 04, 2017  1315 I had an extensive discussion with the patient, his brother, and his mother, using an ASL language interpreter.  The patient has been evaluated extensively for ongoing neck pain which has been going on for an unknown period of time.  He is frustrated because he is still having pain, and that he used crack cocaine in the past.  Recently he has been seen by neurology, and a pain specialist who prescribed tramadol.  The pain specialist did not think there were additional treatments that he could offer.  He also saw his neurosurgeon a couple months ago and at that time there was no surgical treatment felt to be necessary.  Currently, at this time, the patient is resting comfortably after the initial treatment.  [EW]    Clinical Course User Index [EW] Daleen Bo, MD    MDM-musculoskeletal pain likely multifactorial including degenerative disease, muscle spasm, chronic stress, and debilitated state.  At this point treatment can be symptomatic since there is no sign for acute spinal myelopathy.  Patient may benefit from psychiatric evaluation and treatment, and possibly therapy.  Plan-treatment at home with tramadol,  Valium, and prednisone.  Follow-up with PCP as soon as possible to establish ongoing care management, possibly referral to his pain specialist, possibly physical therapy, possibly evaluation by psychiatry/therapy.  Medical screening examination/treatment/procedure(s) were conducted as a shared visit with non-physician practitioner(s) and myself.  I personally evaluated the patient during the encounter    Daleen Bo, MD 03/06/17 [phone]

## 2017-03-09 DIAGNOSIS — Z299 Encounter for prophylactic measures, unspecified: Secondary | ICD-10-CM | POA: Diagnosis not present

## 2017-03-09 DIAGNOSIS — I739 Peripheral vascular disease, unspecified: Secondary | ICD-10-CM | POA: Diagnosis not present

## 2017-03-09 DIAGNOSIS — M542 Cervicalgia: Secondary | ICD-10-CM | POA: Diagnosis not present

## 2017-03-09 DIAGNOSIS — E7800 Pure hypercholesterolemia, unspecified: Secondary | ICD-10-CM | POA: Diagnosis not present

## 2017-03-09 DIAGNOSIS — J45909 Unspecified asthma, uncomplicated: Secondary | ICD-10-CM | POA: Diagnosis not present

## 2017-03-09 DIAGNOSIS — E1165 Type 2 diabetes mellitus with hyperglycemia: Secondary | ICD-10-CM | POA: Diagnosis not present

## 2017-03-09 DIAGNOSIS — R569 Unspecified convulsions: Secondary | ICD-10-CM | POA: Diagnosis not present

## 2017-03-09 DIAGNOSIS — I10 Essential (primary) hypertension: Secondary | ICD-10-CM | POA: Diagnosis not present

## 2017-03-09 DIAGNOSIS — Z6827 Body mass index (BMI) 27.0-27.9, adult: Secondary | ICD-10-CM | POA: Diagnosis not present

## 2017-03-09 LAB — HEMOGLOBIN A1C: Hemoglobin A1C: 6.3

## 2017-03-09 NOTE — Progress Notes (Signed)
Cardiology Office Note  Date: 03/10/2017   ID: Ross, Bender 08-Oct-1955, MRN [id number]  PCP: [name], MD  Primary Cardiologist: Rozann Lesches, MD   Chief Complaint  Patient presents with  . Cardiac follow-up    History of Present Illness: Charles [name] is a 62 y.o. male last seen in September 2016. He presents to the office with his mother for a follow-up visit. History obtained with help of a sign language interpreter. We have seen him with a history of atypical chest pain over time, none recently. He was seen in the ER with neck pain that was felt to be musculoskeletal. No acute abnormalities were noted on cervical spine imaging. Lab work fairly unremarkable.  He continues to follow with Dr. Manuella Ghazi. I reviewed his medications which are outlined below. Current regimen includes aspirin, Lipitor, and Cozaar.  I personally reviewed his ECG today which shows sinus rhythm with LVH, no acute ST segment abnormalities with previously documented repolarization changes.  Past Medical History:  Diagnosis Date  . Anemia   . Asthma   . Deaf   . Essential hypertension   . GERD (gastroesophageal reflux disease)   . History of migraine   . History of stroke    Spastic hemiplegia, right MCA stroke April 2014 in [place]  . Mixed hyperlipidemia   . Other pancytopenia ([place]) 12/23/2015  . PAD (peripheral artery disease) ([place])   . Sarcoidosis   . Seizures ([place])   . Type 2 diabetes mellitus ([place])     Past Surgical History:  Procedure Laterality Date  . BRAVO PH STUDY N/A 06/18/2013   pH STUDY SHOWS NEXIUM TWICE DAILY CONTROLS THE ACID IN HIS STOMACH. HE HAD VERY FEWEPISODE OF REGURGITATION RECORDED IN THE 2 DAYS THE STUDY WAS PERFORMED.   [name] COLONOSCOPY  2011   [place]  . ESOPHAGOGASTRODUODENOSCOPY N/A 06/18/2013   Dr.Fields- probable proximal esophageal web,dilation performed, bravo cap placed, mild non-erosive gastritis in the gastric antrum and on the greater curvature  of the gastric body bx- granulomatous gastritis, duodenal mucosa showed no abnormalities in the bulb and second portion of the duodenum.   . ESOPHAGOGASTRODUODENOSCOPY N/A 05/05/2015   Procedure: ESOPHAGOGASTRODUODENOSCOPY (EGD);  Surgeon: Danie Binder, MD;  [facility];  Service: Endoscopy;  Laterality: N/A;  730  . MALONEY DILATION N/A 06/18/2013   Procedure: MALONEY DILATION;  Surgeon: Danie Binder, MD;  [facility];  Service: Endoscopy;  Laterality: N/A;  . OTHER SURGICAL HISTORY     cyst removal head and chest  . POSTERIOR CERVICAL FUSION/FORAMINOTOMY N/A 10/07/2015   Procedure: Cervical Three-Four, Cervical Four-Five, Cervical Five-Six, Cervical Six-Seven Posterior Cervical Laminectomy and Fusion;  Surgeon: Kary Kos, MD;  Location: [place] NEURO ORS;  Service: Neurosurgery;  Laterality: N/A;  . SAVORY DILATION N/A 06/18/2013   Procedure: SAVORY DILATION;  Surgeon: Danie Binder, MD;  [facility];  Service: Endoscopy;  Laterality: N/A;  . TEE WITHOUT CARDIOVERSION N/A 10/04/2012   Procedure: TRANSESOPHAGEAL ECHOCARDIOGRAM (TEE);  Surgeon: Thayer Headings, MD;  Location: [facility] ENDOSCOPY;  Service: Cardiovascular;  Laterality: N/A;  . [place] accident  Both legs fractured and repaired surgically    Current Outpatient Medications  Medication Sig Dispense Refill  . albuterol (PROAIR HFA) 108 (90 Base) MCG/ACT inhaler Inhale 2 puffs into the lungs every 6 (six) hours as needed for wheezing or shortness of breath.    [name] aspirin 325 MG EC  tablet Take 325 mg by mouth daily.    [name] atorvastatin (LIPITOR) 10 MG tablet Take 10 mg by mouth daily. (cholesterol)    . cholecalciferol (VITAMIN D) 1000 units tablet Take 1,000 Units by mouth daily.    . diazepam (VALIUM) 5 MG tablet Take 1 tablet (5 mg total) by mouth 2 (two) times daily. 10 tablet 0  . escitalopram (LEXAPRO) 5 MG tablet Take 5 mg by mouth daily.     [name] esomeprazole (NEXIUM) 40 MG  capsule Take 1 capsule (40 mg total) by mouth 2 (two) times daily. (Patient taking differently: Take 40 mg by mouth daily. ) 60 capsule 0  . gabapentin (NEURONTIN) 300 MG capsule Take 1 capsule (300 mg total) by mouth 3 (three) times daily. 90 capsule 11  . glucose blood (ONETOUCH VERIO) test strip 1 each by Other route daily. And lancets 1/day 100 each 3  . hydrochlorothiazide (MICROZIDE) 12.5 MG capsule Take 12.5 mg by mouth daily.    [name] levETIRAcetam (KEPPRA) 500 MG tablet Take 1 tablet (500 mg total) by mouth 2 (two) times daily. 60 tablet 0  . losartan (COZAAR) 50 MG tablet Take 50 mg by mouth daily.    . Melatonin 5 MG TABS Take 5 mg by mouth at bedtime.    . mometasone-formoterol (DULERA) 100-5 MCG/ACT AERO Inhale 2 puffs into the lungs every 12 (twelve) hours. 1 Inhaler 11  . naproxen (NAPROSYN) 375 MG tablet Take 375 mg by mouth 2 (two) times daily with a meal.    . oxybutynin (DITROPAN XL) 15 MG 24 hr tablet Take 15 mg by mouth at bedtime.    . predniSONE (DELTASONE) 10 MG tablet Take 4 tablets (40 mg total) by mouth daily. 16 tablet 0  . tiotropium (SPIRIVA HANDIHALER) 18 MCG inhalation capsule Place 18 mcg into inhaler and inhale daily.     [name] tiZANidine (ZANAFLEX) 2 MG tablet Take 1 tablet (2 mg total) by mouth 3 (three) times daily. 60 tablet 6  . traMADol (ULTRAM) 50 MG tablet Take 1 tablet (50 mg total) by mouth every 6 (six) hours as needed. 15 tablet 0   Current Facility-Administered Medications  Medication Dose Route Frequency Provider Last Rate Last Dose  . incobotulinumtoxinA ([name]) 100 units injection 500 Units  500 Units Intramuscular Q90 days Marcial Pacas, MD   500 Units at 11/02/16 1406  . incobotulinumtoxinA ([name]) 100 units injection 500 Units  500 Units Intramuscular Q90 days Marcial Pacas, MD   500 Units at 02/08/17 1419   Allergies:  Shellfish allergy; Hydrocodone; Contrast media [iodinated diagnostic agents]; and Oxycodone   Social History: The patient  reports that  he quit smoking about 25 years ago. His smoking use included cigarettes. He has a 29.00 pack-year smoking history. he has never used smokeless tobacco. He reports that he drinks about 0.6 - 1.2 oz of alcohol per week. He reports that he uses drugs. Drugs: Marijuana and "Crack" cocaine.   ROS:  Please see the history of present illness. Otherwise, complete review of systems is positive for neck pain and spasticity.  All other systems are reviewed and negative.   Physical Exam: VS:  BP 118/76   Pulse 97   Ht 5\' 4"  (1.626 m)   Wt 160 lb 12.8 oz (72.9 kg)   SpO2 98%   BMI 27.60 kg/m , BMI Body mass index is 27.6 kg/m.  Wt Readings from Last 3 Encounters:  03/10/17 160 lb 12.8 oz (72.9 kg)  03/04/17 162 lb (  73.5 kg)  02/08/17 162 lb (73.5 kg)    General: Patient appears comfortable at rest. HEENT: Conjunctiva and lids normal, oropharynx clear. Neck: Supple, no elevated JVP or carotid bruits, no thyromegaly. Lungs: Clear to auscultation, nonlabored breathing at rest. Cardiac: Regular rate and rhythm, no S3, soft systolic murmur. Abdomen: Soft, nontender, bowel sounds present. Extremities: No pitting edema, distal pulses 2+. Skin: Warm and dry. Musculoskeletal: No kyphosis. Neuropsychiatric: Alert and oriented x3, congenital deafness, left-sided weakness - chronic.  ECG: I personally reviewed the tracing from 09/12/2016 which showed sinus rhythm with increased voltage and probable early repolarization.  Recent Labwork: 09/12/2016: ALT 25; AST 27; TSH 2.679 09/13/2016: Magnesium 1.8 03/04/2017: BUN 8; Creatinine, Ser 0.89; Hemoglobin 15.0; Platelets 125; Potassium 3.6; Sodium 138     Component Value Date/Time   CHOL 160 09/13/2016 0202   TRIG 288 (H) 09/13/2016 0202   HDL 59 09/13/2016 0202   CHOLHDL 2.7 09/13/2016 0202   VLDL 58 (H) 09/13/2016 0202   LDLCALC 43 09/13/2016 0202    Other Studies Reviewed Today:  Echocardiogram 09/13/2016: Study Conclusions  - Left ventricle: The  cavity size was normal. Wall thickness was   increased in a pattern of mild LVH. Systolic function was normal.   The estimated ejection fraction was in the range of 60% to 65%.   Wall motion was normal; there were no regional wall motion   abnormalities. Doppler parameters are consistent with abnormal   left ventricular relaxation (grade 1 diastolic dysfunction). - Aortic valve: Valve area (VTI): 2.67 cm^2. Valve area (Vmax):   2.67 cm^2. Valve area (Vmean): 2.64 cm^2. - Technically adequate study.  Assessment and Plan:  1. No recurring chest pain symptoms. He does have a history of vascular disease with previous stroke and left-sided weakness. Continues on aspirin and statin therapy. ECG reviewed and stable. We'll continue with observation.  2. COPD and sarcoidosis. He has followed with Pulmonary over time.  3. Congenital deafness. Sign language interpreter utilized.  4. Essential hypertension, blood pressure is well controlled today.  5. Mixed hyperlipidemia. Continue Lipitor with follow-up per Dr. Manuella Ghazi.  Current medicines were reviewed with the patient today.   Orders Placed This Encounter  Procedures  . EKG 12-Lead    Disposition: Follow-up in one year.  Signed, Satira Sark, MD, [facility] 03/10/2017 1:23 PM    [facility] [place], [place], [place] [address] Phone: [phone]; Fax: [phone]

## 2017-03-10 ENCOUNTER — Encounter: Payer: Self-pay | Admitting: Cardiology

## 2017-03-10 ENCOUNTER — Ambulatory Visit (INDEPENDENT_AMBULATORY_CARE_PROVIDER_SITE_OTHER): Payer: Medicare Other | Admitting: Cardiology

## 2017-03-10 VITALS — BP 118/76 | HR 97 | Ht 64.0 in | Wt 160.8 lb

## 2017-03-10 DIAGNOSIS — J449 Chronic obstructive pulmonary disease, unspecified: Secondary | ICD-10-CM | POA: Diagnosis not present

## 2017-03-10 DIAGNOSIS — I10 Essential (primary) hypertension: Secondary | ICD-10-CM | POA: Diagnosis not present

## 2017-03-10 DIAGNOSIS — Z87898 Personal history of other specified conditions: Secondary | ICD-10-CM | POA: Diagnosis not present

## 2017-03-10 DIAGNOSIS — E782 Mixed hyperlipidemia: Secondary | ICD-10-CM | POA: Diagnosis not present

## 2017-03-10 DIAGNOSIS — H905 Unspecified sensorineural hearing loss: Secondary | ICD-10-CM

## 2017-03-10 NOTE — Patient Instructions (Signed)

## 2017-03-24 DIAGNOSIS — I69354 Hemiplegia and hemiparesis following cerebral infarction affecting left non-dominant side: Secondary | ICD-10-CM | POA: Diagnosis not present

## 2017-03-24 DIAGNOSIS — I10 Essential (primary) hypertension: Secondary | ICD-10-CM | POA: Diagnosis not present

## 2017-03-24 DIAGNOSIS — M5412 Radiculopathy, cervical region: Secondary | ICD-10-CM | POA: Diagnosis not present

## 2017-03-24 DIAGNOSIS — M542 Cervicalgia: Secondary | ICD-10-CM | POA: Diagnosis not present

## 2017-03-24 DIAGNOSIS — Z87820 Personal history of traumatic brain injury: Secondary | ICD-10-CM | POA: Diagnosis not present

## 2017-03-24 DIAGNOSIS — Z91041 Radiographic dye allergy status: Secondary | ICD-10-CM | POA: Diagnosis not present

## 2017-03-24 DIAGNOSIS — Z87891 Personal history of nicotine dependence: Secondary | ICD-10-CM | POA: Diagnosis not present

## 2017-03-24 DIAGNOSIS — Z91013 Allergy to seafood: Secondary | ICD-10-CM | POA: Diagnosis not present

## 2017-03-24 DIAGNOSIS — D869 Sarcoidosis, unspecified: Secondary | ICD-10-CM | POA: Diagnosis not present

## 2017-03-24 DIAGNOSIS — J45909 Unspecified asthma, uncomplicated: Secondary | ICD-10-CM | POA: Diagnosis not present

## 2017-03-24 DIAGNOSIS — Z79899 Other long term (current) drug therapy: Secondary | ICD-10-CM | POA: Diagnosis not present

## 2017-03-24 DIAGNOSIS — N3281 Overactive bladder: Secondary | ICD-10-CM | POA: Diagnosis not present

## 2017-03-24 DIAGNOSIS — R55 Syncope and collapse: Secondary | ICD-10-CM | POA: Diagnosis not present

## 2017-03-24 DIAGNOSIS — G44209 Tension-type headache, unspecified, not intractable: Secondary | ICD-10-CM | POA: Diagnosis not present

## 2017-03-24 DIAGNOSIS — R030 Elevated blood-pressure reading, without diagnosis of hypertension: Secondary | ICD-10-CM | POA: Diagnosis not present

## 2017-03-24 DIAGNOSIS — E119 Type 2 diabetes mellitus without complications: Secondary | ICD-10-CM | POA: Diagnosis not present

## 2017-03-24 DIAGNOSIS — E7800 Pure hypercholesterolemia, unspecified: Secondary | ICD-10-CM | POA: Diagnosis not present

## 2017-03-24 DIAGNOSIS — Z7951 Long term (current) use of inhaled steroids: Secondary | ICD-10-CM | POA: Diagnosis not present

## 2017-03-24 DIAGNOSIS — Z886 Allergy status to analgesic agent status: Secondary | ICD-10-CM | POA: Diagnosis not present

## 2017-03-24 DIAGNOSIS — H9190 Unspecified hearing loss, unspecified ear: Secondary | ICD-10-CM | POA: Diagnosis not present

## 2017-03-25 DIAGNOSIS — I10 Essential (primary) hypertension: Secondary | ICD-10-CM | POA: Diagnosis not present

## 2017-03-25 DIAGNOSIS — E119 Type 2 diabetes mellitus without complications: Secondary | ICD-10-CM | POA: Diagnosis not present

## 2017-03-25 DIAGNOSIS — R55 Syncope and collapse: Secondary | ICD-10-CM | POA: Diagnosis not present

## 2017-03-25 DIAGNOSIS — M542 Cervicalgia: Secondary | ICD-10-CM | POA: Diagnosis not present

## 2017-03-29 ENCOUNTER — Ambulatory Visit: Payer: Medicare Other | Admitting: Podiatry

## 2017-04-04 DIAGNOSIS — M5442 Lumbago with sciatica, left side: Secondary | ICD-10-CM | POA: Diagnosis not present

## 2017-04-04 DIAGNOSIS — G8929 Other chronic pain: Secondary | ICD-10-CM | POA: Diagnosis not present

## 2017-04-04 DIAGNOSIS — M5481 Occipital neuralgia: Secondary | ICD-10-CM | POA: Diagnosis not present

## 2017-04-04 DIAGNOSIS — M79605 Pain in left leg: Secondary | ICD-10-CM | POA: Diagnosis not present

## 2017-04-10 ENCOUNTER — Other Ambulatory Visit: Payer: Self-pay | Admitting: Neurosurgery

## 2017-04-10 DIAGNOSIS — M79605 Pain in left leg: Secondary | ICD-10-CM

## 2017-04-11 DIAGNOSIS — I69359 Hemiplegia and hemiparesis following cerebral infarction affecting unspecified side: Secondary | ICD-10-CM | POA: Diagnosis not present

## 2017-04-11 DIAGNOSIS — M5442 Lumbago with sciatica, left side: Secondary | ICD-10-CM | POA: Diagnosis not present

## 2017-04-11 DIAGNOSIS — G8929 Other chronic pain: Secondary | ICD-10-CM | POA: Diagnosis not present

## 2017-04-11 DIAGNOSIS — E1151 Type 2 diabetes mellitus with diabetic peripheral angiopathy without gangrene: Secondary | ICD-10-CM | POA: Diagnosis not present

## 2017-04-11 DIAGNOSIS — M5481 Occipital neuralgia: Secondary | ICD-10-CM | POA: Diagnosis not present

## 2017-04-11 DIAGNOSIS — I10 Essential (primary) hypertension: Secondary | ICD-10-CM | POA: Diagnosis not present

## 2017-04-13 DIAGNOSIS — Z299 Encounter for prophylactic measures, unspecified: Secondary | ICD-10-CM | POA: Diagnosis not present

## 2017-04-13 DIAGNOSIS — E1165 Type 2 diabetes mellitus with hyperglycemia: Secondary | ICD-10-CM | POA: Diagnosis not present

## 2017-04-13 DIAGNOSIS — I10 Essential (primary) hypertension: Secondary | ICD-10-CM | POA: Diagnosis not present

## 2017-04-13 DIAGNOSIS — K219 Gastro-esophageal reflux disease without esophagitis: Secondary | ICD-10-CM | POA: Diagnosis not present

## 2017-04-13 DIAGNOSIS — Z6827 Body mass index (BMI) 27.0-27.9, adult: Secondary | ICD-10-CM | POA: Diagnosis not present

## 2017-04-13 DIAGNOSIS — I639 Cerebral infarction, unspecified: Secondary | ICD-10-CM | POA: Diagnosis not present

## 2017-04-13 DIAGNOSIS — R350 Frequency of micturition: Secondary | ICD-10-CM | POA: Diagnosis not present

## 2017-04-13 DIAGNOSIS — R32 Unspecified urinary incontinence: Secondary | ICD-10-CM | POA: Diagnosis not present

## 2017-04-13 DIAGNOSIS — G4700 Insomnia, unspecified: Secondary | ICD-10-CM | POA: Diagnosis not present

## 2017-04-18 DIAGNOSIS — M5442 Lumbago with sciatica, left side: Secondary | ICD-10-CM | POA: Diagnosis not present

## 2017-04-18 DIAGNOSIS — M5481 Occipital neuralgia: Secondary | ICD-10-CM | POA: Diagnosis not present

## 2017-04-18 DIAGNOSIS — I10 Essential (primary) hypertension: Secondary | ICD-10-CM | POA: Diagnosis not present

## 2017-04-18 DIAGNOSIS — G8929 Other chronic pain: Secondary | ICD-10-CM | POA: Diagnosis not present

## 2017-04-18 DIAGNOSIS — I69359 Hemiplegia and hemiparesis following cerebral infarction affecting unspecified side: Secondary | ICD-10-CM | POA: Diagnosis not present

## 2017-04-18 DIAGNOSIS — E1151 Type 2 diabetes mellitus with diabetic peripheral angiopathy without gangrene: Secondary | ICD-10-CM | POA: Diagnosis not present

## 2017-04-20 ENCOUNTER — Other Ambulatory Visit: Payer: Self-pay | Admitting: Neurosurgery

## 2017-04-20 DIAGNOSIS — M5442 Lumbago with sciatica, left side: Secondary | ICD-10-CM | POA: Diagnosis not present

## 2017-04-20 DIAGNOSIS — I69359 Hemiplegia and hemiparesis following cerebral infarction affecting unspecified side: Secondary | ICD-10-CM | POA: Diagnosis not present

## 2017-04-20 DIAGNOSIS — M5481 Occipital neuralgia: Secondary | ICD-10-CM

## 2017-04-20 DIAGNOSIS — I10 Essential (primary) hypertension: Secondary | ICD-10-CM | POA: Diagnosis not present

## 2017-04-20 DIAGNOSIS — E1151 Type 2 diabetes mellitus with diabetic peripheral angiopathy without gangrene: Secondary | ICD-10-CM | POA: Diagnosis not present

## 2017-04-20 DIAGNOSIS — G8929 Other chronic pain: Secondary | ICD-10-CM | POA: Diagnosis not present

## 2017-04-21 DIAGNOSIS — K219 Gastro-esophageal reflux disease without esophagitis: Secondary | ICD-10-CM | POA: Diagnosis not present

## 2017-04-21 DIAGNOSIS — Z299 Encounter for prophylactic measures, unspecified: Secondary | ICD-10-CM | POA: Diagnosis not present

## 2017-04-21 DIAGNOSIS — E1165 Type 2 diabetes mellitus with hyperglycemia: Secondary | ICD-10-CM | POA: Diagnosis not present

## 2017-04-21 DIAGNOSIS — I10 Essential (primary) hypertension: Secondary | ICD-10-CM | POA: Diagnosis not present

## 2017-04-21 DIAGNOSIS — I639 Cerebral infarction, unspecified: Secondary | ICD-10-CM | POA: Diagnosis not present

## 2017-04-21 DIAGNOSIS — G4700 Insomnia, unspecified: Secondary | ICD-10-CM | POA: Diagnosis not present

## 2017-04-21 DIAGNOSIS — Z6827 Body mass index (BMI) 27.0-27.9, adult: Secondary | ICD-10-CM | POA: Diagnosis not present

## 2017-04-22 ENCOUNTER — Emergency Department (HOSPITAL_COMMUNITY): Payer: Medicare Other

## 2017-04-22 ENCOUNTER — Other Ambulatory Visit: Payer: Self-pay

## 2017-04-22 ENCOUNTER — Emergency Department (HOSPITAL_COMMUNITY)
Admission: EM | Admit: 2017-04-22 | Discharge: 2017-04-22 | Disposition: A | Discharge status: Home / Self Care | Payer: Medicare Other | Attending: Emergency Medicine | Admitting: Emergency Medicine

## 2017-04-22 DIAGNOSIS — R51 Headache: Secondary | ICD-10-CM | POA: Diagnosis not present

## 2017-04-22 DIAGNOSIS — E119 Type 2 diabetes mellitus without complications: Secondary | ICD-10-CM | POA: Diagnosis not present

## 2017-04-22 DIAGNOSIS — J449 Chronic obstructive pulmonary disease, unspecified: Secondary | ICD-10-CM | POA: Insufficient documentation

## 2017-04-22 DIAGNOSIS — Z8673 Personal history of transient ischemic attack (TIA), and cerebral infarction without residual deficits: Secondary | ICD-10-CM | POA: Diagnosis not present

## 2017-04-22 DIAGNOSIS — I10 Essential (primary) hypertension: Secondary | ICD-10-CM | POA: Insufficient documentation

## 2017-04-22 DIAGNOSIS — J45909 Unspecified asthma, uncomplicated: Secondary | ICD-10-CM | POA: Diagnosis not present

## 2017-04-22 DIAGNOSIS — Z79899 Other long term (current) drug therapy: Secondary | ICD-10-CM | POA: Diagnosis not present

## 2017-04-22 DIAGNOSIS — G43909 Migraine, unspecified, not intractable, without status migrainosus: Secondary | ICD-10-CM | POA: Diagnosis not present

## 2017-04-22 DIAGNOSIS — Z87891 Personal history of nicotine dependence: Secondary | ICD-10-CM | POA: Diagnosis not present

## 2017-04-22 DIAGNOSIS — Z7982 Long term (current) use of aspirin: Secondary | ICD-10-CM | POA: Diagnosis not present

## 2017-04-22 LAB — CBC WITH DIFFERENTIAL/PLATELET
Basophils Absolute: 0 10*3/uL (ref 0.0–0.1)
Basophils Relative: 0 %
EOS PCT: 2 %
Eosinophils Absolute: 0.1 10*3/uL (ref 0.0–0.7)
HCT: 41.7 % (ref 39.0–52.0)
Hemoglobin: 14.9 g/dL (ref 13.0–17.0)
LYMPHS ABS: 1.8 10*3/uL (ref 0.7–4.0)
LYMPHS PCT: 35 %
MCH: 31.6 pg (ref 26.0–34.0)
MCHC: 35.7 g/dL (ref 30.0–36.0)
MCV: 88.5 fL (ref 78.0–100.0)
MONO ABS: 0.3 10*3/uL (ref 0.1–1.0)
Monocytes Relative: 5 %
Neutro Abs: 2.9 10*3/uL (ref 1.7–7.7)
Neutrophils Relative %: 58 %
PLATELETS: 121 10*3/uL — AB (ref 150–400)
RBC: 4.71 MIL/uL (ref 4.22–5.81)
RDW: 12.5 % (ref 11.5–15.5)
WBC: 5.1 10*3/uL (ref 4.0–10.5)

## 2017-04-22 LAB — I-STAT CHEM 8, ED
BUN: 14 mg/dL (ref 6–20)
CALCIUM ION: 1.16 mmol/L (ref 1.15–1.40)
Chloride: 98 mmol/L — ABNORMAL LOW (ref 101–111)
Creatinine, Ser: 1.2 mg/dL (ref 0.61–1.24)
GLUCOSE: 81 mg/dL (ref 65–99)
HCT: 46 % (ref 39.0–52.0)
HEMOGLOBIN: 15.6 g/dL (ref 13.0–17.0)
Potassium: 3.5 mmol/L (ref 3.5–5.1)
Sodium: 141 mmol/L (ref 135–145)
TCO2: 31 mmol/L (ref 22–32)

## 2017-04-22 MED ORDER — SODIUM CHLORIDE 0.9 % IV BOLUS (SEPSIS)
1000.0000 mL | Freq: Once | INTRAVENOUS | Status: AC
  Administered 2017-04-22: 1000 mL via INTRAVENOUS

## 2017-04-22 MED ORDER — DIPHENHYDRAMINE HCL 50 MG/ML IJ SOLN
12.5000 mg | Freq: Once | INTRAMUSCULAR | Status: AC
  Administered 2017-04-22: 12.5 mg via INTRAVENOUS
  Filled 2017-04-22: qty 1

## 2017-04-22 MED ORDER — PROCHLORPERAZINE EDISYLATE 5 MG/ML IJ SOLN
10.0000 mg | Freq: Once | INTRAMUSCULAR | Status: AC
  Administered 2017-04-22: 10 mg via INTRAVENOUS
  Filled 2017-04-22: qty 2

## 2017-04-22 NOTE — ED Notes (Signed)
Pt laying in room with eyes closed; arrousable when prompted. Resp e/u, vitals stable. Family still at bedside.

## 2017-04-22 NOTE — ED Triage Notes (Signed)
Pt in c/o headache on L side of head intermittent onset 5 years ago, pt reports pain relief with staying still and being quiet, pts mother is here for eval, pt reports living with her, pt brought in by his sister in law, pt has L sided paralysis from CVA in 2014, pt has bruise to L ear, pt family states, "he falls a lot."

## 2017-04-22 NOTE — ED Provider Notes (Signed)
[place] EMERGENCY DEPARTMENT Provider Note   CSN: [id number] Arrival date & time: 04/22/17  1618     History   Chief Complaint Chief Complaint  Patient presents with  . Headache    HPI Charles [name] is a 62 y.o. male.  HPI  62 year old male history of hypertension, chronic migraines, prior cerebrovascular accident April 2014 with left-sided persistent deficit upper extremity greater than lower extremity, deaf, diabetes and seizures presents the emergency department with acute on chronic left-sided headache times 48 hours not relieved with his typical Tylenol use to relieve discomfort.  Patient denies any recent illness, fever, trauma or change in baseline functional status.   Past Medical History:  Diagnosis Date  . Anemia   . Asthma   . Deaf   . Essential hypertension   . GERD (gastroesophageal reflux disease)   . History of migraine   . History of stroke    Spastic hemiplegia, right MCA stroke April 2014 in [place]  . Mixed hyperlipidemia   . Other pancytopenia ([place]) 12/23/2015  . PAD (peripheral artery disease) ([place])   . Sarcoidosis   . Seizures ([place])   . Type 2 diabetes mellitus [facility])     Patient Active Problem List   Diagnosis Date Noted  . Thrombocytopenia ([place]) 09/13/2016  . Hypoglycemia   . Syncope 09/12/2016  . Seizure disorder as sequela of cerebrovascular accident ([place]) 09/12/2016  . MDD (major depressive disorder), recurrent episode, severe ([place]) 12/30/2015  . Suicidal ideation   . Other pancytopenia ([place]) 12/23/2015  . Loss of weight 11/12/2015  . 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 Iron deficiency anemia 09/22/2015  . Spinal stenosis in cervical region 08/21/2015  . Neck pain 08/12/2015  . Low back pain 08/12/2015  . Abnormality of gait 08/12/2015  . Neuropathy of right upper extremity 06/29/2015  . Acute rhinitis 05/05/2015  . Abdominal pain, epigastric 04/23/2015  . Arm pain, right 04/23/2015  .  Atypical chest pain 07/23/2013  . 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 Dyspepsia 06/13/2013  . Dysphagia 06/13/2013  . Headache 11/30/2012  . Solitary pulmonary nodule 11/20/2012  . Spastic hemiplegia affecting left nondominant side ([place]) 11/08/2012  . Essential hypertension, benign 10/12/2012  . COPD GOLD II with reversibility  10/12/2012  . 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 CVA (cerebral vascular accident) ([place]) 10/03/2012  . Diabetes ([place]) 10/03/2012  . Sarcoidosis ([place]) 10/03/2012    Past Surgical History:  Procedure Laterality Date  . BRAVO PH STUDY N/A 06/18/2013   pH STUDY SHOWS NEXIUM TWICE DAILY CONTROLS THE ACID IN HIS STOMACH. HE HAD VERY FEWEPISODE OF REGURGITATION RECORDED IN THE 2 DAYS THE STUDY WAS PERFORMED.   [name] COLONOSCOPY  2011   [place]  . 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   . ESOPHAGOGASTRODUODENOSCOPY N/A 05/05/2015   Procedure: ESOPHAGOGASTRODUODENOSCOPY (EGD);  Surgeon: Danie Binder, MD;  [facility];  Service: Endoscopy;  Laterality: N/A;  730  . MALONEY DILATION N/A 06/18/2013   Procedure: MALONEY DILATION;  Surgeon: Danie Binder, MD;  [facility];  Service: Endoscopy;  Laterality: N/A;  . OTHER SURGICAL HISTORY     cyst removal head and chest  . POSTERIOR CERVICAL FUSION/FORAMINOTOMY N/A 10/07/2015   Procedure: Cervical Three-Four,  Cervical Four-Five, Cervical Five-Six, Cervical Six-Seven Posterior Cervical Laminectomy and Fusion;  Surgeon: Kary Kos, MD;  Location: [place] NEURO ORS;  Service: Neurosurgery;  Laterality: N/A;  . SAVORY DILATION N/A 06/18/2013   Procedure: SAVORY DILATION;  Surgeon: Danie Binder, MD;  [facility];  Service: Endoscopy;  Laterality: N/A;  . TEE WITHOUT CARDIOVERSION N/A 10/04/2012   Procedure: TRANSESOPHAGEAL ECHOCARDIOGRAM (TEE);  Surgeon: Thayer Headings, MD;  Location: [place];  Service: Cardiovascular;  Laterality: N/A;  . [place] accident  Both legs fractured and repaired surgically       Home Medications    Prior to Admission medications   Medication Sig Start Date End Date Taking? Authorizing Provider  albuterol (PROAIR HFA) 108 (90 Base) MCG/ACT inhaler Inhale 2 puffs into the lungs every 6 (six) hours as needed for wheezing or shortness of breath.    [provider]  aspirin 325 MG EC tablet Take 325 mg by mouth daily.    [provider]  atorvastatin (LIPITOR) 10 MG tablet Take 10 mg by mouth daily. (cholesterol)    [provider]  cholecalciferol (VITAMIN D) 1000 units tablet Take 1,000 Units by mouth daily.    [provider]  diazepam (VALIUM) 5 MG tablet Take 1 tablet (5 mg total) by mouth 2 (two) times daily. 03/04/17   Maczis, Barth Kirks, PA-C  escitalopram (LEXAPRO) 5 MG tablet Take 5 mg by mouth daily.     [provider]  esomeprazole (NEXIUM) 40 MG capsule Take 1 capsule (40 mg total) by mouth 2 (two) times daily. Patient taking differently: Take 40 mg by mouth daily.  04/17/15   Carmin Muskrat, MD  gabapentin (NEURONTIN) 300 MG capsule Take 1 capsule (300 mg total) by mouth 3 (three) times daily. 12/07/15   Marcial Pacas, MD  glucose blood (ONETOUCH VERIO) test strip 1 each by Other route daily. And lancets 1/day 06/03/16   Renato Shin, MD  hydrochlorothiazide (MICROZIDE) 12.5 MG capsule Take 12.5 mg by mouth daily.    [provider]  levETIRAcetam (KEPPRA) 500 MG tablet Take 1 tablet (500 mg total) by mouth 2 (two) times daily. 05/23/16   Rolland Porter, MD  losartan (COZAAR) 50 MG tablet Take 50 mg by mouth daily.    [provider]  Melatonin 5 MG TABS Take 5  mg by mouth at bedtime.    [provider]  mometasone-formoterol (DULERA) 100-5 MCG/ACT AERO Inhale 2 puffs into the lungs every 12 (twelve) hours. 02/09/16   Tanda Rockers, MD  naproxen (NAPROSYN) 375 MG tablet Take 375 mg by mouth 2 (two) times daily with a meal.    [provider]  oxybutynin (DITROPAN XL) 15 MG 24 hr tablet Take 15 mg by mouth at bedtime.    [provider]  predniSONE (DELTASONE) 10 MG tablet Take 4 tablets (40 mg total) by mouth daily. 03/04/17   Maczis, Barth Kirks, PA-C  tiotropium (SPIRIVA HANDIHALER) 18 MCG inhalation capsule Place 18 mcg into inhaler and inhale daily.     [provider]  tiZANidine (ZANAFLEX) 2 MG tablet Take 1 tablet (2 mg total) by mouth 3 (three) times daily. 03/08/16   Marcial Pacas, MD  traMADol (ULTRAM) 50 MG tablet Take 1 tablet (50 mg total) by mouth every 6 (six) hours as needed. 03/04/17   Maczis, Barth Kirks, PA-C    Family History Family History  Problem Relation Age of Onset  .  Diabetes Mother   . Hypertension Mother   . Diabetes Father   . Hypertension Father   . Colon polyps Neg Hx   . Colon cancer Neg Hx     Social History Social History   Tobacco Use  . Smoking status: Former Smoker    Packs/day: 1.00    Years: 29.00    Pack years: 29.00    Types: Cigarettes    Last attempt to quit: 02/22/1992    Years since quitting: 25.1  . Smokeless tobacco: Never Used  Substance Use Topics  . Alcohol use: Yes    Alcohol/week: 0.6 - 1.2 oz    Types: 1 - 2 Cans of beer per week    Comment: occasionally, every 2-3 days  . Drug use: Yes    Types: Marijuana, "Crack" cocaine    Comment: hx of crack cocaine "long time ago" per pt - hard to tell how long it is has been since he used     Allergies   Shellfish allergy; Hydrocodone; Contrast media [iodinated diagnostic agents]; and Oxycodone   Review of Systems Review of Systems  Review of Systems  Constitutional: Negative for fever and chills.    HENT: Negative for ear pain, sore throat and trouble swallowing.   Eyes: Negative for pain and visual disturbance.  Respiratory: Negative for cough and shortness of breath.   Cardiovascular: Negative for chest pain and leg swelling.  Gastrointestinal: Negative for nausea, vomiting, abdominal pain and diarrhea.  Genitourinary: Negative for dysuria, urgency and frequency.  Musculoskeletal: Negative for back pain and joint swelling.  Skin: Negative for rash and wound.  Neurological: + HA ; see HPI  Physical Exam Updated Vital Signs BP (!) 157/97   Pulse 64   Temp 98.2 F (36.8 C) (Oral)   Resp 16   SpO2 100%   Physical Exam  Physical Exam Vitals:   04/22/17 2045 04/22/17 2100  BP: (!) 160/92 (!) 157/97  Pulse: 66 64  Resp:  16  Temp:    SpO2: 100% 100%   Constitutional: Patient is in no acute distress Head: Normocephalic and atraumatic.  Eyes: Extraocular motion intact, no scleral icterus Neck: Supple without meningismus, mass, or overt JVD Respiratory: Effort normal and breath sounds normal. No respiratory distress. CV: Heart regular rate and rhythm, no obvious murmurs.  Pulses +2 and symmetric Abdomen: Soft, non-tender, non-distended MSK: Extremities are atraumatic without deformity, ROM intact Skin: Warm, dry, intact Neuro: Alert and oriented, neg facial droop; intact per baseline;  Psychiatric: Mood and affect are normal.  ED Treatments / Results  Labs (all labs ordered are listed, but only abnormal results are displayed) Labs Reviewed  CBC WITH DIFFERENTIAL/PLATELET - Abnormal; Notable for the following components:      Result Value   Platelets 121 (*)    All other components within normal limits  I-STAT CHEM 8, ED - Abnormal; Notable for the following components:   Chloride 98 (*)    All other components within normal limits    EKG  EKG Interpretation None       Radiology Ct Head Wo Contrast  Result Date: 04/22/2017 CLINICAL DATA:  Patient with  left-sided headache, intermittent prior CVA. EXAM: CT HEAD WITHOUT CONTRAST TECHNIQUE: Contiguous axial images were obtained from the base of the skull through the vertex without intravenous contrast. COMPARISON:  Brain CT 03/24/2017. FINDINGS: Brain: Demonstrated sequelae of right MCA territory infarct with ex vacuo dilatation of the right lateral ventricle. Periventricular and subcortical white matter hypodensity compatible  with chronic microvascular ischemic changes. No evidence for acute cortically based infarct, intracranial hemorrhage, mass lesion or mass-effect. Vascular: Unremarkable. Skull: Intact. Sinuses/Orbits: Paranasal sinuses are well aerated. Mastoid air cells are unremarkable. Orbits are unremarkable. Other: None. IMPRESSION: No acute intracranial process. Sequelae of remote right MCA territory infarct. Electronically Signed   By: Lovey Newcomer M.D.   On: 04/22/2017 20:18    Procedures Procedures (including critical care time)  Medications Ordered in ED Medications  sodium chloride 0.9 % bolus 1,000 mL (0 mLs Intravenous Stopped 04/22/17 2100)  prochlorperazine (COMPAZINE) injection 10 mg (10 mg Intravenous Given 04/22/17 1940)  diphenhydrAMINE (BENADRYL) injection 12.5 mg (12.5 mg Intravenous Given 04/22/17 1939)     Initial Impression / Assessment and Plan / ED Course  I have reviewed the triage vital signs and the nursing notes.  Pertinent labs & imaging results that were available during my care of the patient were reviewed by me and considered in my medical decision making (see chart for details).     62 year old male history of hypertension, chronic migraines, prior cerebrovascular accident April 2014 with left-sided persistent deficit upper extremity greater than lower extremity, deaf, diabetes and seizures presents the emergency department with acute on chronic left-sided headache times 48 hours not relieved with his typical Tylenol use to relieve discomfort.  Patient denies any  recent illness, fever, trauma or change in baseline functional status.  Patient arrives hemodynamically stable well-appearing.  Physical exam as annotated above with no new functional deficit.  Review of CT head shows no findings concerning for intracranial hemorrhage/interval change from prior.  Labs reviewed no findings concerning for infection/stable hemoglobin of 14.9 and negative electrolyte imbalance.  Patient was given Compazine/Benadryl/1 L normal saline bolus in the emergency department and observed for a period of couple hours with near total resolution of headache suspected as acute on chronic migraines.  I had a lengthy discussion with family members/patient with the sign language service regarding follow-up with primary neurologist outpatient for reevaluation within the next several days.  Patient has good return precautions will be discharged home.  Doubt CVA/ICH; suspect acute/chronic migraine   Final Clinical Impressions(s) / ED Diagnoses   Final diagnoses:  Migraine without status migrainosus, not intractable, unspecified migraine type    ED Discharge Orders    None       Willette Alma, DO 04/22/17 2153    Lajean Saver, MD 04/22/17 [phone]

## 2017-04-22 NOTE — ED Notes (Signed)
Patient transported to CT 

## 2017-04-22 NOTE — ED Triage Notes (Signed)
Pt is DEAF-- will need interpretor

## 2017-04-22 NOTE — ED Notes (Signed)
Pt up for discharge, per resident not to discharge pt until seen by Dr. Ashok Cordia.

## 2017-04-22 NOTE — ED Notes (Signed)
Pt anxious c/o difficulty breathing, sats 99% on RA; HOB elevated. Pt still appearing anxious, placed on 2 L [place]. Pt calming down at this time. Will continue to monitor. Family at bedside.

## 2017-04-25 DIAGNOSIS — M5442 Lumbago with sciatica, left side: Secondary | ICD-10-CM | POA: Diagnosis not present

## 2017-04-25 DIAGNOSIS — G8929 Other chronic pain: Secondary | ICD-10-CM | POA: Diagnosis not present

## 2017-04-25 DIAGNOSIS — E1151 Type 2 diabetes mellitus with diabetic peripheral angiopathy without gangrene: Secondary | ICD-10-CM | POA: Diagnosis not present

## 2017-04-25 DIAGNOSIS — M5481 Occipital neuralgia: Secondary | ICD-10-CM | POA: Diagnosis not present

## 2017-04-25 DIAGNOSIS — I10 Essential (primary) hypertension: Secondary | ICD-10-CM | POA: Diagnosis not present

## 2017-04-25 DIAGNOSIS — I69359 Hemiplegia and hemiparesis following cerebral infarction affecting unspecified side: Secondary | ICD-10-CM | POA: Diagnosis not present

## 2017-04-26 ENCOUNTER — Ambulatory Visit
Admission: RE | Admit: 2017-04-26 | Discharge: 2017-04-26 | Disposition: A | Discharge status: Home / Self Care | Payer: Medicare Other | Source: Ambulatory Visit | Attending: Neurosurgery | Admitting: Neurosurgery

## 2017-04-26 DIAGNOSIS — M79605 Pain in left leg: Secondary | ICD-10-CM

## 2017-04-26 DIAGNOSIS — M25552 Pain in left hip: Secondary | ICD-10-CM | POA: Diagnosis not present

## 2017-04-27 DIAGNOSIS — M79605 Pain in left leg: Secondary | ICD-10-CM | POA: Diagnosis not present

## 2017-05-01 ENCOUNTER — Inpatient Hospital Stay
Admission: RE | Admit: 2017-05-01 | Discharge: 2017-05-01 | Disposition: A | Discharge status: Home / Self Care | Payer: Medicare Other | Source: Ambulatory Visit | Attending: Neurosurgery | Admitting: Neurosurgery

## 2017-05-01 DIAGNOSIS — M25552 Pain in left hip: Secondary | ICD-10-CM | POA: Diagnosis not present

## 2017-05-01 DIAGNOSIS — M5481 Occipital neuralgia: Secondary | ICD-10-CM | POA: Diagnosis not present

## 2017-05-01 DIAGNOSIS — I69359 Hemiplegia and hemiparesis following cerebral infarction affecting unspecified side: Secondary | ICD-10-CM | POA: Diagnosis not present

## 2017-05-01 DIAGNOSIS — I10 Essential (primary) hypertension: Secondary | ICD-10-CM | POA: Diagnosis not present

## 2017-05-01 DIAGNOSIS — G8929 Other chronic pain: Secondary | ICD-10-CM | POA: Diagnosis not present

## 2017-05-01 DIAGNOSIS — E1151 Type 2 diabetes mellitus with diabetic peripheral angiopathy without gangrene: Secondary | ICD-10-CM | POA: Diagnosis not present

## 2017-05-01 DIAGNOSIS — M5442 Lumbago with sciatica, left side: Secondary | ICD-10-CM | POA: Diagnosis not present

## 2017-05-01 DIAGNOSIS — M545 Low back pain: Secondary | ICD-10-CM | POA: Diagnosis not present

## 2017-05-02 DIAGNOSIS — I10 Essential (primary) hypertension: Secondary | ICD-10-CM | POA: Diagnosis not present

## 2017-05-02 DIAGNOSIS — M5481 Occipital neuralgia: Secondary | ICD-10-CM | POA: Diagnosis not present

## 2017-05-02 DIAGNOSIS — M545 Low back pain: Secondary | ICD-10-CM | POA: Diagnosis not present

## 2017-05-02 DIAGNOSIS — E1151 Type 2 diabetes mellitus with diabetic peripheral angiopathy without gangrene: Secondary | ICD-10-CM | POA: Diagnosis not present

## 2017-05-02 DIAGNOSIS — M5442 Lumbago with sciatica, left side: Secondary | ICD-10-CM | POA: Diagnosis not present

## 2017-05-02 DIAGNOSIS — G8929 Other chronic pain: Secondary | ICD-10-CM | POA: Diagnosis not present

## 2017-05-02 DIAGNOSIS — I69359 Hemiplegia and hemiparesis following cerebral infarction affecting unspecified side: Secondary | ICD-10-CM | POA: Diagnosis not present

## 2017-05-02 DIAGNOSIS — M25552 Pain in left hip: Secondary | ICD-10-CM | POA: Diagnosis not present

## 2017-05-03 ENCOUNTER — Ambulatory Visit: Payer: Self-pay | Admitting: Neurology

## 2017-05-03 ENCOUNTER — Telehealth: Payer: Self-pay | Admitting: *Deleted

## 2017-05-03 DIAGNOSIS — G8929 Other chronic pain: Secondary | ICD-10-CM | POA: Diagnosis not present

## 2017-05-03 DIAGNOSIS — I69359 Hemiplegia and hemiparesis following cerebral infarction affecting unspecified side: Secondary | ICD-10-CM | POA: Diagnosis not present

## 2017-05-03 DIAGNOSIS — M5481 Occipital neuralgia: Secondary | ICD-10-CM | POA: Diagnosis not present

## 2017-05-03 DIAGNOSIS — E1151 Type 2 diabetes mellitus with diabetic peripheral angiopathy without gangrene: Secondary | ICD-10-CM | POA: Diagnosis not present

## 2017-05-03 DIAGNOSIS — M5442 Lumbago with sciatica, left side: Secondary | ICD-10-CM | POA: Diagnosis not present

## 2017-05-03 DIAGNOSIS — I10 Essential (primary) hypertension: Secondary | ICD-10-CM | POA: Diagnosis not present

## 2017-05-03 NOTE — Telephone Encounter (Signed)
No showed [name] appointment.

## 2017-05-04 ENCOUNTER — Encounter: Payer: Self-pay | Admitting: Neurology

## 2017-05-04 DIAGNOSIS — I10 Essential (primary) hypertension: Secondary | ICD-10-CM | POA: Diagnosis not present

## 2017-05-04 DIAGNOSIS — M5481 Occipital neuralgia: Secondary | ICD-10-CM | POA: Diagnosis not present

## 2017-05-04 DIAGNOSIS — M5442 Lumbago with sciatica, left side: Secondary | ICD-10-CM | POA: Diagnosis not present

## 2017-05-04 DIAGNOSIS — I69359 Hemiplegia and hemiparesis following cerebral infarction affecting unspecified side: Secondary | ICD-10-CM | POA: Diagnosis not present

## 2017-05-04 DIAGNOSIS — G8929 Other chronic pain: Secondary | ICD-10-CM | POA: Diagnosis not present

## 2017-05-04 DIAGNOSIS — E1151 Type 2 diabetes mellitus with diabetic peripheral angiopathy without gangrene: Secondary | ICD-10-CM | POA: Diagnosis not present

## 2017-05-08 DIAGNOSIS — I10 Essential (primary) hypertension: Secondary | ICD-10-CM | POA: Diagnosis not present

## 2017-05-08 DIAGNOSIS — E1151 Type 2 diabetes mellitus with diabetic peripheral angiopathy without gangrene: Secondary | ICD-10-CM | POA: Diagnosis not present

## 2017-05-08 DIAGNOSIS — M5481 Occipital neuralgia: Secondary | ICD-10-CM | POA: Diagnosis not present

## 2017-05-08 DIAGNOSIS — G8929 Other chronic pain: Secondary | ICD-10-CM | POA: Diagnosis not present

## 2017-05-08 DIAGNOSIS — I69359 Hemiplegia and hemiparesis following cerebral infarction affecting unspecified side: Secondary | ICD-10-CM | POA: Diagnosis not present

## 2017-05-08 DIAGNOSIS — M5442 Lumbago with sciatica, left side: Secondary | ICD-10-CM | POA: Diagnosis not present

## 2017-05-11 DIAGNOSIS — M5481 Occipital neuralgia: Secondary | ICD-10-CM | POA: Diagnosis not present

## 2017-05-11 DIAGNOSIS — I10 Essential (primary) hypertension: Secondary | ICD-10-CM | POA: Diagnosis not present

## 2017-05-11 DIAGNOSIS — I69359 Hemiplegia and hemiparesis following cerebral infarction affecting unspecified side: Secondary | ICD-10-CM | POA: Diagnosis not present

## 2017-05-11 DIAGNOSIS — M5442 Lumbago with sciatica, left side: Secondary | ICD-10-CM | POA: Diagnosis not present

## 2017-05-11 DIAGNOSIS — E1151 Type 2 diabetes mellitus with diabetic peripheral angiopathy without gangrene: Secondary | ICD-10-CM | POA: Diagnosis not present

## 2017-05-11 DIAGNOSIS — G8929 Other chronic pain: Secondary | ICD-10-CM | POA: Diagnosis not present

## 2017-05-15 DIAGNOSIS — G8929 Other chronic pain: Secondary | ICD-10-CM | POA: Diagnosis not present

## 2017-05-15 DIAGNOSIS — I69359 Hemiplegia and hemiparesis following cerebral infarction affecting unspecified side: Secondary | ICD-10-CM | POA: Diagnosis not present

## 2017-05-15 DIAGNOSIS — E1151 Type 2 diabetes mellitus with diabetic peripheral angiopathy without gangrene: Secondary | ICD-10-CM | POA: Diagnosis not present

## 2017-05-15 DIAGNOSIS — M5442 Lumbago with sciatica, left side: Secondary | ICD-10-CM | POA: Diagnosis not present

## 2017-05-15 DIAGNOSIS — I10 Essential (primary) hypertension: Secondary | ICD-10-CM | POA: Diagnosis not present

## 2017-05-15 DIAGNOSIS — M5481 Occipital neuralgia: Secondary | ICD-10-CM | POA: Diagnosis not present

## 2017-05-16 ENCOUNTER — Ambulatory Visit (INDEPENDENT_AMBULATORY_CARE_PROVIDER_SITE_OTHER): Payer: Medicare Other | Admitting: "Endocrinology

## 2017-05-16 ENCOUNTER — Encounter: Payer: Self-pay | Admitting: "Endocrinology

## 2017-05-16 VITALS — BP 149/92 | HR 82 | Ht 64.0 in | Wt 160.0 lb

## 2017-05-16 DIAGNOSIS — I10 Essential (primary) hypertension: Secondary | ICD-10-CM | POA: Diagnosis not present

## 2017-05-16 DIAGNOSIS — E782 Mixed hyperlipidemia: Secondary | ICD-10-CM | POA: Diagnosis not present

## 2017-05-16 DIAGNOSIS — E1165 Type 2 diabetes mellitus with hyperglycemia: Secondary | ICD-10-CM

## 2017-05-16 NOTE — Progress Notes (Signed)
Endocrinology Consult Note       05/16/2017, 6:10 PM   Subjective:    Patient ID: Charles [name], male    DOB: 09-20-1955.  Charles [name] is being seen in consultation for management of currently uncontrolled symptomatic diabetes requested by  [name], MD.   Past Medical History:  Diagnosis Date  . Anemia   . Asthma   . Deaf   . Essential hypertension   . GERD (gastroesophageal reflux disease)   . History of migraine   . History of stroke    Spastic hemiplegia, right MCA stroke April 2014 in [place]  . Mixed hyperlipidemia   . Other pancytopenia ([place]) 12/23/2015  . PAD (peripheral artery disease) ([place])   . Sarcoidosis   . Seizures ([place])   . Type 2 diabetes mellitus ([place])    Past Surgical History:  Procedure Laterality Date  . BRAVO PH STUDY N/A 06/18/2013   pH STUDY SHOWS NEXIUM TWICE DAILY CONTROLS THE ACID IN HIS STOMACH. HE HAD VERY FEWEPISODE OF REGURGITATION RECORDED IN THE 2 DAYS THE STUDY WAS PERFORMED.   [name] COLONOSCOPY  2011   [place]  . 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   . ESOPHAGOGASTRODUODENOSCOPY N/A 05/05/2015   Procedure: ESOPHAGOGASTRODUODENOSCOPY (EGD);  Surgeon: Danie Binder, MD;  [facility];  Service: Endoscopy;  Laterality: N/A;  730  . MALONEY DILATION N/A 06/18/2013   Procedure: MALONEY DILATION;  Surgeon: Danie Binder, MD;  [facility];  Service: Endoscopy;  Laterality: N/A;  . OTHER SURGICAL HISTORY     cyst removal head and chest  . POSTERIOR CERVICAL FUSION/FORAMINOTOMY N/A 10/07/2015   Procedure: Cervical Three-Four, Cervical  Four-Five, Cervical Five-Six, Cervical Six-Seven Posterior Cervical Laminectomy and Fusion;  Surgeon: Kary Kos, MD;  Location: [facility];  Service: Neurosurgery;  Laterality: N/A;  . SAVORY DILATION N/A 06/18/2013   Procedure: SAVORY DILATION;  Surgeon: Danie Binder, MD;  [facility];  Service: Endoscopy;  Laterality: N/A;  . TEE WITHOUT CARDIOVERSION N/A 10/04/2012   Procedure: TRANSESOPHAGEAL ECHOCARDIOGRAM (TEE);  Surgeon: Thayer Headings, MD;  Location: [facility];  Service: Cardiovascular;  Laterality: N/A;  . [facility] accident  Both legs fractured and repaired surgically   Social History   Socioeconomic History  . Marital status: Single    Spouse name: Not on file  . Number of children: 0  . Years of education: HS  . Highest education level: Not on file  Occupational History  . Occupation: Disabled  Social Needs  . Financial resource strain: Not on file  . Food insecurity:    Worry: Not on file    Inability: Not on file  . Transportation needs:    Medical: Not on file    Non-medical: Not on file  Tobacco Use  . Smoking status: Former Smoker  Packs/day: 1.00    Years: 29.00    Pack years: 29.00    Types: Cigarettes    Last attempt to quit: 02/22/1992    Years since quitting: 25.2  . Smokeless tobacco: Never Used  Substance and Sexual Activity  . Alcohol use: Yes    Alcohol/week: 0.6 - 1.2 oz    Types: 1 - 2 Cans of beer per week    Comment: occasionally, every 2-3 days  . Drug use: Yes    Types: Marijuana, "Crack" cocaine    Comment: hx of crack cocaine "long time ago" per pt - hard to tell how long it is has been since he used  . Sexual activity: Never  Lifestyle  . Physical activity:    Days per week: Not on file    Minutes per session: Not on file  . Stress: Not on file  Relationships  . Social connections:    Talks on phone: Not on file    Gets together: Not on file    Attends religious service: Not on file     Active member of club or organization: Not on file    Attends meetings of clubs or organizations: Not on file    Relationship status: Not on file  Other Topics Concern  . Not on file  Social History Narrative   Patient lives at home with his family. Brother, sister, mother    Patient does not work.    Patient has a high school education.    Patient has one step child    Outpatient Encounter Medications as of 05/16/2017  Medication Sig  . albuterol (PROAIR HFA) 108 (90 Base) MCG/ACT inhaler Inhale 2 puffs into the lungs every 6 (six) hours as needed for wheezing or shortness of breath.  [name] atorvastatin (LIPITOR) 10 MG tablet Take 10 mg by mouth daily. (cholesterol)  . cholecalciferol (VITAMIN D) 1000 units tablet Take 1,000 Units by mouth daily.  . clopidogrel (PLAVIX) 75 MG tablet Take 75 mg by mouth daily.  . cyclobenzaprine (FLEXERIL) 10 MG tablet Take 10 mg by mouth 2 (two) times daily.  . DULoxetine (CYMBALTA) 60 MG capsule Take 60 mg by mouth daily.  [name] gabapentin (NEURONTIN) 300 MG capsule Take 1 capsule (300 mg total) by mouth 3 (three) times daily.  . hydrochlorothiazide (MICROZIDE) 12.5 MG capsule Take 12.5 mg by mouth daily.  [name] levETIRAcetam (KEPPRA) 500 MG tablet Take 1 tablet (500 mg total) by mouth 2 (two) times daily.  . Melatonin 5 MG TABS Take 5 mg by mouth at bedtime.  . mometasone-formoterol (DULERA) 100-5 MCG/ACT AERO Inhale 2 puffs into the lungs every 12 (twelve) hours.  . naproxen (NAPROSYN) 375 MG tablet Take 375 mg by mouth 2 (two) times daily with a meal.  . oxybutynin (DITROPAN XL) 15 MG 24 hr tablet Take 15 mg by mouth at bedtime.  . sitaGLIPtin (JANUVIA) 100 MG tablet Take 100 mg by mouth daily.  [name] tiotropium (SPIRIVA HANDIHALER) 18 MCG inhalation capsule Place 18 mcg into inhaler and inhale daily.   [name] tiZANidine (ZANAFLEX) 2 MG tablet Take 1 tablet (2 mg total) by mouth 3 (three) times daily. (Patient taking differently: Take 4 mg by mouth 2 (two) times  daily. )  . aspirin 325 MG EC tablet Take 325 mg by mouth daily.  . diazepam (VALIUM) 5 MG tablet Take 1 tablet (5 mg total) by mouth 2 (two) times daily.  [name] escitalopram (LEXAPRO) 5 MG tablet Take 5 mg by mouth daily.   [name]  esomeprazole (NEXIUM) 40 MG capsule Take 1 capsule (40 mg total) by mouth 2 (two) times daily. (Patient taking differently: Take 40 mg by mouth daily. )  . glucose blood (ONETOUCH VERIO) test strip 1 each by Other route daily. And lancets 1/day  . losartan (COZAAR) 50 MG tablet Take 50 mg by mouth daily.  . predniSONE (DELTASONE) 10 MG tablet Take 4 tablets (40 mg total) by mouth daily.  . traMADol (ULTRAM) 50 MG tablet Take 1 tablet (50 mg total) by mouth every 6 (six) hours as needed.   Facility-Administered Encounter Medications as of 05/16/2017  Medication  . incobotulinumtoxinA ([name]) 100 units injection 500 Units  . incobotulinumtoxinA ([name]) 100 units injection 500 Units    ALLERGIES: Allergies  Allergen Reactions  . Shellfish Allergy Hives and Swelling    SWELLING REACTION UNSPECIFIED   . Hydrocodone Other (See Comments)    Makes "looney"  . Contrast Media [Iodinated Diagnostic Agents] Itching  . Oxycodone Nausea And Vomiting    VACCINATION STATUS: Immunization History  Administered Date(s) Administered  . Influenza Whole 11/22/2011  . Influenza, Seasonal, Injecte, Preservative Fre 01/13/2014  . Influenza,inj,Quad PF,6+ Mos 12/01/2012, 02/03/2015, 12/23/2015  . Pneumococcal Conjugate-13 02/22/2012    Diabetes  He presents for his initial diabetic visit. He has type 2 diabetes mellitus. His disease course has been stable. There are no hypoglycemic associated symptoms. Pertinent negatives for hypoglycemia include no confusion, headaches, pallor or seizures. There are no diabetic associated symptoms. Pertinent negatives for diabetes include no chest pain, no fatigue, no polydipsia, no polyphagia, no polyuria and no weakness. Symptoms are stable.  Diabetic complications include a CVA. Risk factors for coronary artery disease include diabetes mellitus, dyslipidemia, family history, male sex, hypertension, sedentary lifestyle and tobacco exposure. Current diabetic treatment includes oral agent (monotherapy). His weight is stable. He is following a generally unhealthy diet. When asked about meal planning, he reported none. He has not had a previous visit with a dietitian. He never (He is status post CVA with left-sided hemiplegia and aphasia.) participates in exercise. (He did not bring any meter no logs to review today.  His most recent A1c was 6.3%.  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  He communicates through sign language interpreter.) An ACE inhibitor/angiotensin II receptor blocker is being taken.      Review of Systems  Constitutional: Negative for chills, fatigue, fever and unexpected weight change.  HENT: Negative for dental problem, mouth sores and trouble swallowing.   Eyes: Negative for visual disturbance.  Respiratory: Negative for cough, choking, chest tightness, shortness of breath and wheezing.   Cardiovascular: Negative for chest pain, palpitations and leg swelling.  Gastrointestinal: Negative for abdominal distention, abdominal pain, constipation, diarrhea, nausea and vomiting.  Endocrine: Negative for polydipsia, polyphagia and polyuria.  Genitourinary: Negative for dysuria, flank pain, hematuria and urgency.  Musculoskeletal: Positive for gait problem. Negative for back pain, myalgias and neck pain.  Skin: Negative for pallor, rash and wound.  Neurological: Negative for seizures, syncope, weakness, numbness and headaches.  Psychiatric/Behavioral: Negative for confusion and dysphoric mood.    Objective:    BP (!) 149/92   Pulse 82   Ht 5\' 4"  (1.626 m)   Wt 160 lb (72.6 kg)   BMI 27.46 kg/m   Wt Readings from Last 3 Encounters:  05/16/17 160 lb (72.6 kg)  03/10/17 160 lb 12.8  oz (72.9 kg)  03/04/17 162 lb (73.5 kg)     Physical Exam  Constitutional:  He is oriented to person, place, and time. He appears well-developed. He is cooperative. No distress.  Patient ambulates with a special walker related to his CVA with left-sided hemiplegia and aphasia.  HENT:  Head: Normocephalic and atraumatic.  Eyes: EOM are normal.  Neck: Normal range of motion. Neck supple. No tracheal deviation present. No thyromegaly present.  Cardiovascular: Normal rate, S1 normal, S2 normal and normal heart sounds. Exam reveals no gallop.  No murmur heard. Pulses:      Dorsalis pedis pulses are 1+ on the right side, and 1+ on the left side.       Posterior tibial pulses are 1+ on the right side, and 1+ on the left side.  Pulmonary/Chest: Effort normal. No respiratory distress. He has no wheezes.  Abdominal: Soft. Bowel sounds are normal. He exhibits no distension. There is no tenderness. There is no guarding and no CVA tenderness.  Musculoskeletal: He exhibits deformity. He exhibits no edema.       Right shoulder: He exhibits no swelling and no deformity.  Patient ambulates with a special walker related to his CVA with left-sided hemiplegia.  Neurological: He is alert and oriented to person, place, and time. He has normal strength and normal reflexes. No cranial nerve deficit or sensory deficit. Gait normal.  Patient ambulates with a special walker related to his CVA with left-sided hemiplegia and aphasia.  Skin: Skin is warm and dry. No rash noted. No cyanosis. Nails show no clubbing.  Psychiatric: Judgment normal.  Patient is aphasic related to his CVA.     CMP ( most recent) CMP     Component Value Date/Time   NA 141 04/22/2017 2004   K 3.5 04/22/2017 2004   CL 98 (L) 04/22/2017 2004   CO2 28 03/04/2017 1224   GLUCOSE 81 04/22/2017 2004   BUN 14 04/22/2017 2004   CREATININE 1.20 04/22/2017 2004   CALCIUM 9.1 03/04/2017 1224   PROT 7.2 09/12/2016 1428   ALBUMIN 4.2  09/12/2016 1428   AST 27 09/12/2016 1428   ALT 25 09/12/2016 1428   ALKPHOS 60 09/12/2016 1428   BILITOT 0.6 09/12/2016 1428   GFRNONAA >60 03/04/2017 1224   GFRAA >60 03/04/2017 1224     Diabetic Labs (most recent): Lab Results  Component Value Date   HGBA1C 6.3 03/09/2017   HGBA1C 5.8 (H) 09/12/2016   HGBA1C 5.1 09/30/2015     Lipid Panel ( most recent) Lipid Panel     Component Value Date/Time   CHOL 160 09/13/2016 0202   TRIG 288 (H) 09/13/2016 0202   HDL 59 09/13/2016 0202   CHOLHDL 2.7 09/13/2016 0202   VLDL 58 (H) 09/13/2016 0202   LDLCALC 43 09/13/2016 0202      Lab Results  Component Value Date   TSH 2.679 09/12/2016   TSH 2.38 11/12/2015   TSH 3.560 06/19/2013   TSH 2.511 10/02/2012       Assessment & Plan:   1. Uncontrolled type 2 diabetes mellitus with hyperglycemia ([place])  - Charles [name] has currently controlled asymptomatic type 2 DM since 62 years of age,  with most recent A1c of 6.3 %. Recent labs reviewed.  -his diabetes is complicated by CVA with left-sided hemiplegia and motor aphasia communicating with sign language interpreter and Charles [name] remains at a high risk for more acute and chronic complications which include CAD, CVA, CKD, retinopathy, and neuropathy. These are all discussed in detail with the patient.  - I have counseled him  on diet management and by adopting a carbohydrate restricted/protein rich diet.  - Suggestion is made for him to avoid simple carbohydrates  from his diet including Cakes, Sweet Desserts, Ice Cream, Soda (diet and regular), Sweet Tea, Candies, Chips, Cookies, Store Bought Juices, Alcohol in Excess of  1-2 drinks a day, Artificial Sweeteners, and "Sugar-free" Products. This will help patient to have stable blood glucose profile and potentially avoid unintended weight gain.  - I encouraged him to switch to  unprocessed or minimally processed complex starch and increased protein intake (animal or plant  source), fruits, and vegetables.  - he is advised to stick to a routine mealtimes to eat 3 meals  a day and avoid unnecessary snacks ( to snack only to correct hypoglycemia).   - I have approached him with the following individualized plan to manage diabetes and patient agrees:   -With the help of his sign language interpreter, Elwyn Reach, I communicated the management plan with him. -The #1 priority in this patient would be to avoid hypoglycemia.   - Given his presentation with A1c of 6.3% consistent with controlled diabetes he would not need any change in his medications today.  -  I advised him to continue Januvia 100 mg p.o. daily with breakfast.   -He does not have enough functionality to monitor blood glucose and inject medications .  He will be considered only for oral medications if he is losing control.  -He could not confirm if he is taking prednisone currently, might have been started as a result of his sarcoidosis.  He is advised to bring all of his medications with him for reconciliation.  - Patient specific target  A1c;  LDL, HDL, Triglycerides, and  Waist Circumference were discussed in detail.  2) BP/HTN: His blood pressure is uncontrolled at 149/92.  Patient status post CVA with hemiplegia .  He has enough medications to control blood pressure, advised to be consistent in taking his medications.    3) Lipids/HPL: Recent lipid panel with LDL of 43 and triglycerides at 288.  He is advised to continue his Lipitor 10 mg p.o. Nightly.  4)  Weight/Diet: Slightly overweight, cannot exercise optimally.  Detailed carbohydrate information provided.    5) Chronic Care/Health Maintenance:  -he  is on ACEI/ARB and Statin medications and  is encouraged to continue to follow up with Ophthalmology, Dentist,  Podiatrist at least yearly or according to recommendations, and advised to  stay away from smoking. I have recommended yearly flu vaccine and pneumonia vaccination at least every 5  years;  and  sleep for at least 7 hours a day.  - I advised patient to maintain close follow up with [name], MD for primary care needs.  - Time spent with the patient: 45 minutes, of which >50% was spent in obtaining information about his symptoms, reviewing his previous labs, evaluations, and treatments, counseling him about his complicated type 2 diabetes, hyperlipidemia, hypertension, and developing a plan to confirm the diagnosis and long term treatment as necessary.  Tyler Deis participated in the discussions, expressed understanding, and voiced agreement with the above plans.  All questions were answered to his satisfaction. he is encouraged to contact clinic should he have any questions or concerns prior to his return visit.  Follow up plan: - Return in about 1 month (around 06/13/2017) for follow up with pre-visit labs.  Glade Lloyd, MD [facility] [address] [place], [place] [address] Phone: [phone]  Fax: [phone]    05/16/2017, 6:10 PM  This note was partially dictated with voice recognition software. Similar sounding words can be transcribed inadequately or may not  be corrected upon review.

## 2017-05-16 NOTE — Patient Instructions (Signed)

## 2017-05-17 ENCOUNTER — Ambulatory Visit: Payer: Medicare Other | Admitting: Podiatry

## 2017-05-17 DIAGNOSIS — M5481 Occipital neuralgia: Secondary | ICD-10-CM | POA: Diagnosis not present

## 2017-05-17 DIAGNOSIS — E782 Mixed hyperlipidemia: Secondary | ICD-10-CM | POA: Insufficient documentation

## 2017-05-17 DIAGNOSIS — I69359 Hemiplegia and hemiparesis following cerebral infarction affecting unspecified side: Secondary | ICD-10-CM | POA: Diagnosis not present

## 2017-05-17 DIAGNOSIS — I10 Essential (primary) hypertension: Secondary | ICD-10-CM | POA: Diagnosis not present

## 2017-05-17 DIAGNOSIS — M5442 Lumbago with sciatica, left side: Secondary | ICD-10-CM | POA: Diagnosis not present

## 2017-05-17 DIAGNOSIS — G8929 Other chronic pain: Secondary | ICD-10-CM | POA: Diagnosis not present

## 2017-05-17 DIAGNOSIS — E1151 Type 2 diabetes mellitus with diabetic peripheral angiopathy without gangrene: Secondary | ICD-10-CM | POA: Diagnosis not present

## 2017-05-19 ENCOUNTER — Other Ambulatory Visit: Payer: Medicare Other

## 2017-05-24 ENCOUNTER — Inpatient Hospital Stay: Admission: RE | Admit: 2017-05-24 | Payer: Medicare Other | Source: Ambulatory Visit

## 2017-06-13 ENCOUNTER — Encounter: Payer: Self-pay | Admitting: "Endocrinology

## 2017-06-13 ENCOUNTER — Ambulatory Visit (INDEPENDENT_AMBULATORY_CARE_PROVIDER_SITE_OTHER): Payer: Medicare Other | Admitting: "Endocrinology

## 2017-06-13 VITALS — BP 148/87 | HR 103 | Wt 166.0 lb

## 2017-06-13 DIAGNOSIS — E782 Mixed hyperlipidemia: Secondary | ICD-10-CM | POA: Diagnosis not present

## 2017-06-13 DIAGNOSIS — E1165 Type 2 diabetes mellitus with hyperglycemia: Secondary | ICD-10-CM

## 2017-06-13 DIAGNOSIS — I10 Essential (primary) hypertension: Secondary | ICD-10-CM | POA: Diagnosis not present

## 2017-06-13 LAB — T4, FREE: Free T4: 1.5 ng/dL (ref 0.8–1.8)

## 2017-06-13 LAB — TSH: TSH: 1.83 m[IU]/L (ref 0.40–4.50)

## 2017-06-13 NOTE — Progress Notes (Signed)
Endocrinology Consult Note       06/13/2017, 12:39 PM   Subjective:    Patient ID: Charles [name], male    DOB: 1955/07/25.  Charles [name] is being seen in consultation for management of currently uncontrolled symptomatic diabetes requested by  [name], MD.   Past Medical History:  Diagnosis Date  . Anemia   . Asthma   . Deaf   . Essential hypertension   . GERD (gastroesophageal reflux disease)   . History of migraine   . History of stroke    Spastic hemiplegia, right MCA stroke April 2014 in [place]  . Mixed hyperlipidemia   . Other pancytopenia ([place]) 12/23/2015  . PAD (peripheral artery disease) ([place])   . Sarcoidosis   . Seizures ([place])   . Type 2 diabetes mellitus ([place])    Past Surgical History:  Procedure Laterality Date  . BRAVO PH STUDY N/A 06/18/2013   pH STUDY SHOWS NEXIUM TWICE DAILY CONTROLS THE ACID IN HIS STOMACH. HE HAD VERY FEWEPISODE OF REGURGITATION RECORDED IN THE 2 DAYS THE STUDY WAS PERFORMED.   [name] COLONOSCOPY  2011   [place]  . 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   . ESOPHAGOGASTRODUODENOSCOPY N/A 05/05/2015   Procedure: ESOPHAGOGASTRODUODENOSCOPY (EGD);  Surgeon: Danie Binder, MD;  [facility];  Service: Endoscopy;  Laterality: N/A;  730  . MALONEY DILATION N/A 06/18/2013   Procedure: MALONEY DILATION;  Surgeon: Danie Binder, MD;  [facility];  Service: Endoscopy;  Laterality: N/A;  . OTHER SURGICAL HISTORY     cyst removal head and chest  . POSTERIOR CERVICAL FUSION/FORAMINOTOMY N/A 10/07/2015   Procedure: Cervical Three-Four, Cervical  Four-Five, Cervical Five-Six, Cervical Six-Seven Posterior Cervical Laminectomy and Fusion;  Surgeon: Kary Kos, MD;  Location: [facility];  Service: Neurosurgery;  Laterality: N/A;  . SAVORY DILATION N/A 06/18/2013   Procedure: SAVORY DILATION;  Surgeon: Danie Binder, MD;  [facility];  Service: Endoscopy;  Laterality: N/A;  . TEE WITHOUT CARDIOVERSION N/A 10/04/2012   Procedure: TRANSESOPHAGEAL ECHOCARDIOGRAM (TEE);  Surgeon: Thayer Headings, MD;  Location: [facility];  Service: Cardiovascular;  Laterality: N/A;  . [place] accident  Both legs fractured and repaired surgically   Social History   Socioeconomic History  . Marital status: Single    Spouse name: Not on file  . Number of children: 0  . Years of education: HS  . Highest education level: Not on file  Occupational History  . Occupation: Disabled  Social Needs  . Financial resource strain: Not on file  . Food insecurity:    Worry: Not on file    Inability: Not on file  . Transportation needs:    Medical: Not on file    Non-medical: Not on file  Tobacco Use  . Smoking status: Former Smoker  Packs/day: 1.00    Years: 29.00    Pack years: 29.00    Types: Cigarettes    Last attempt to quit: 02/22/1992    Years since quitting: 25.3  . Smokeless tobacco: Never Used  Substance and Sexual Activity  . Alcohol use: Yes    Alcohol/week: 0.6 - 1.2 oz    Types: 1 - 2 Cans of beer per week    Comment: occasionally, every 2-3 days  . Drug use: Yes    Types: Marijuana, "Crack" cocaine    Comment: hx of crack cocaine "long time ago" per pt - hard to tell how long it is has been since he used  . Sexual activity: Never  Lifestyle  . Physical activity:    Days per week: Not on file    Minutes per session: Not on file  . Stress: Not on file  Relationships  . Social connections:    Talks on phone: Not on file    Gets together: Not on file    Attends religious service: Not on file     Active member of club or organization: Not on file    Attends meetings of clubs or organizations: Not on file    Relationship status: Not on file  Other Topics Concern  . Not on file  Social History Narrative   Patient lives at home with his family. Brother, sister, mother    Patient does not work.    Patient has a high school education.    Patient has one step child    Outpatient Encounter Medications as of 06/13/2017  Medication Sig  . albuterol (PROAIR HFA) 108 (90 Base) MCG/ACT inhaler Inhale 2 puffs into the lungs every 6 (six) hours as needed for wheezing or shortness of breath.  [name] aspirin 325 MG EC tablet Take 325 mg by mouth daily.  [name] atorvastatin (LIPITOR) 10 MG tablet Take 10 mg by mouth daily. (cholesterol)  . cholecalciferol (VITAMIN D) 1000 units tablet Take 1,000 Units by mouth daily.  . clopidogrel (PLAVIX) 75 MG tablet Take 75 mg by mouth daily.  . cyclobenzaprine (FLEXERIL) 10 MG tablet Take 10 mg by mouth 2 (two) times daily.  . diazepam (VALIUM) 5 MG tablet Take 1 tablet (5 mg total) by mouth 2 (two) times daily.  . DULoxetine (CYMBALTA) 60 MG capsule Take 60 mg by mouth daily.  [name] escitalopram (LEXAPRO) 5 MG tablet Take 5 mg by mouth daily.   [name] esomeprazole (NEXIUM) 40 MG capsule Take 1 capsule (40 mg total) by mouth 2 (two) times daily. (Patient taking differently: Take 40 mg by mouth daily. )  . gabapentin (NEURONTIN) 300 MG capsule Take 1 capsule (300 mg total) by mouth 3 (three) times daily.  [name] glucose blood (ONETOUCH VERIO) test strip 1 each by Other route daily. And lancets 1/day  . hydrochlorothiazide (MICROZIDE) 12.5 MG capsule Take 12.5 mg by mouth daily.  [name] levETIRAcetam (KEPPRA) 500 MG tablet Take 1 tablet (500 mg total) by mouth 2 (two) times daily.  [name] losartan (COZAAR) 50 MG tablet Take 50 mg by mouth daily.  . Melatonin 5 MG TABS Take 5 mg by mouth at bedtime.  . mometasone-formoterol (DULERA) 100-5 MCG/ACT AERO Inhale 2 puffs into the lungs every 12  (twelve) hours.  . naproxen (NAPROSYN) 375 MG tablet Take 375 mg by mouth 2 (two) times daily with a meal.  . oxybutynin (DITROPAN XL) 15 MG 24 hr tablet Take 15 mg by mouth at bedtime.  . predniSONE (  DELTASONE) 10 MG tablet Take 4 tablets (40 mg total) by mouth daily.  . sitaGLIPtin (JANUVIA) 100 MG tablet Take 100 mg by mouth daily.  [name] tiotropium (SPIRIVA HANDIHALER) 18 MCG inhalation capsule Place 18 mcg into inhaler and inhale daily.   [name] tiZANidine (ZANAFLEX) 2 MG tablet Take 1 tablet (2 mg total) by mouth 3 (three) times daily. (Patient taking differently: Take 4 mg by mouth 2 (two) times daily. )  . traMADol (ULTRAM) 50 MG tablet Take 1 tablet (50 mg total) by mouth every 6 (six) hours as needed.   Facility-Administered Encounter Medications as of 06/13/2017  Medication  . incobotulinumtoxinA ([name]) 100 units injection 500 Units  . incobotulinumtoxinA ([name]) 100 units injection 500 Units    ALLERGIES: Allergies  Allergen Reactions  . Shellfish Allergy Hives and Swelling    SWELLING REACTION UNSPECIFIED   . Hydrocodone Other (See Comments)    Makes "looney"  . Contrast Media [Iodinated Diagnostic Agents] Itching  . Oxycodone Nausea And Vomiting    VACCINATION STATUS: Immunization History  Administered Date(s) Administered  . Influenza Whole 11/22/2011  . Influenza, Seasonal, Injecte, Preservative Fre 01/13/2014  . Influenza,inj,Quad PF,6+ Mos 12/01/2012, 02/03/2015, 12/23/2015  . Pneumococcal Conjugate-13 02/22/2012    Diabetes  He presents for his follow-up diabetic visit. He has type 2 diabetes mellitus. His disease course has been stable. There are no hypoglycemic associated symptoms. Pertinent negatives for hypoglycemia include no confusion, headaches, pallor or seizures. There are no diabetic associated symptoms. Pertinent negatives for diabetes include no chest pain, no fatigue, no polydipsia, no polyphagia, no polyuria and no weakness. Symptoms are stable. Diabetic  complications include a CVA. Risk factors for coronary artery disease include diabetes mellitus, dyslipidemia, family history, male sex, hypertension, sedentary lifestyle and tobacco exposure. Current diabetic treatment includes oral agent (monotherapy). His weight is increasing steadily. He is following a generally unhealthy diet. When asked about meal planning, he reported none. He has not had a previous visit with a dietitian. He never (He is status post CVA with left-sided hemiplegia and aphasia.) participates in exercise. (He did not bring any meter no logs to review today.  He did not do his A1c before this visit.  His most recent A1c was 6.3% from January 2019.  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  He communicates through sign language interpreter.) An ACE inhibitor/angiotensin II receptor blocker is being taken.    Review of Systems  Constitutional: Negative for chills, fatigue, fever and unexpected weight change.  HENT: Negative for dental problem, mouth sores and trouble swallowing.   Eyes: Negative for visual disturbance.  Respiratory: Negative for cough, choking, chest tightness, shortness of breath and wheezing.   Cardiovascular: Negative for chest pain, palpitations and leg swelling.  Gastrointestinal: Negative for abdominal distention, abdominal pain, constipation, diarrhea, nausea and vomiting.  Endocrine: Negative for polydipsia, polyphagia and polyuria.  Genitourinary: Negative for dysuria, flank pain, hematuria and urgency.  Musculoskeletal: Positive for gait problem. Negative for back pain, myalgias and neck pain.  Skin: Negative for pallor, rash and wound.  Neurological: Negative for seizures, syncope, weakness, numbness and headaches.  Psychiatric/Behavioral: Negative for confusion and dysphoric mood.    Objective:    BP (!) 148/87   Pulse (!) 103   Wt 166 lb (75.3 kg)   BMI 28.49 kg/m   Wt Readings from Last 3 Encounters:   06/13/17 166 lb (75.3 kg)  05/16/17 160 lb (72.6 kg)  03/10/17 160 lb 12.8 oz (72.9  kg)     Physical Exam  Constitutional: He is oriented to person, place, and time. He appears well-developed. He is cooperative. No distress.  Patient ambulates with a special walker related to his CVA with left-sided hemiplegia and aphasia.  HENT:  Head: Normocephalic and atraumatic.  Eyes: EOM are normal.  Neck: Normal range of motion. Neck supple. No tracheal deviation present. No thyromegaly present.  Cardiovascular: Normal rate, S1 normal, S2 normal and normal heart sounds. Exam reveals no gallop.  No murmur heard. Pulses:      Dorsalis pedis pulses are 1+ on the right side, and 1+ on the left side.       Posterior tibial pulses are 1+ on the right side, and 1+ on the left side.  Pulmonary/Chest: Effort normal. No respiratory distress. He has no wheezes.  Abdominal: He exhibits no distension. There is no tenderness. There is no guarding and no CVA tenderness.  Musculoskeletal: He exhibits deformity. He exhibits no edema.       Right shoulder: He exhibits no swelling and no deformity.  Patient ambulates with a special walker related to his CVA with left-sided hemiplegia.  Neurological: He is alert and oriented to person, place, and time. He has normal strength and normal reflexes. No cranial nerve deficit or sensory deficit. Gait normal.  Patient ambulates with a special walker related to his CVA with left-sided hemiplegia and aphasia.  Skin: Skin is warm and dry. No rash noted. No cyanosis. Nails show no clubbing.  Psychiatric: Judgment normal.  Patient is aphasic related to his CVA.     CMP ( most recent) CMP     Component Value Date/Time   NA 141 04/22/2017 2004   K 3.5 04/22/2017 2004   CL 98 (L) 04/22/2017 2004   CO2 28 03/04/2017 1224   GLUCOSE 81 04/22/2017 2004   BUN 14 04/22/2017 2004   CREATININE 1.20 04/22/2017 2004   CALCIUM 9.1 03/04/2017 1224   PROT 7.2 09/12/2016 1428    ALBUMIN 4.2 09/12/2016 1428   AST 27 09/12/2016 1428   ALT 25 09/12/2016 1428   ALKPHOS 60 09/12/2016 1428   BILITOT 0.6 09/12/2016 1428   GFRNONAA >60 03/04/2017 1224   GFRAA >60 03/04/2017 1224     Diabetic Labs (most recent): Lab Results  Component Value Date   HGBA1C 6.3 03/09/2017   HGBA1C 5.8 (H) 09/12/2016   HGBA1C 5.1 09/30/2015     Lipid Panel ( most recent) Lipid Panel     Component Value Date/Time   CHOL 160 09/13/2016 0202   TRIG 288 (H) 09/13/2016 0202   HDL 59 09/13/2016 0202   CHOLHDL 2.7 09/13/2016 0202   VLDL 58 (H) 09/13/2016 0202   LDLCALC 43 09/13/2016 0202      Lab Results  Component Value Date   TSH 2.679 09/12/2016   TSH 2.38 11/12/2015   TSH 3.560 06/19/2013   TSH 2.511 10/02/2012       Assessment & Plan:   1. Uncontrolled type 2 diabetes mellitus with hyperglycemia ([place])  - Charles [name] has currently controlled asymptomatic type 2 DM since 62 years of age. -He was supposed to have labs including A1c before this visit, fortunately he missed his appointment for labs. His A1c was 6.3% from January 2019.  He has normal renal function.  -his diabetes is complicated by CVA with left-sided hemiplegia and motor aphasia communicating with sign language interpreter and Charles [name] remains at a high risk for more acute and chronic  complications which include CAD, CVA, CKD, retinopathy, and neuropathy. These are all discussed in detail with the patient.  - I have counseled him on diet management and by adopting a carbohydrate restricted/protein rich diet.  -He admits to some dietary indiscretion.     -  Suggestion is made for him to avoid simple carbohydrates  from his diet including Cakes, Sweet Desserts / Pastries, Ice Cream, Soda (diet and regular), Sweet Tea, Candies, Chips, Cookies, Store Bought Juices, Alcohol in Excess of  1-2 drinks a day, Artificial Sweeteners, and "Sugar-free" Products. This will help patient to have stable blood  glucose profile and potentially avoid unintended weight gain.   - I encouraged him to switch to  unprocessed or minimally processed complex starch and increased protein intake (animal or plant source), fruits, and vegetables.  - he is advised to stick to a routine mealtimes to eat 3 meals  a day and avoid unnecessary snacks ( to snack only to correct hypoglycemia).   - I have approached him with the following individualized plan to manage diabetes and patient agrees:   -With the help of his sign language interpreter, Rosalia Hammers, I communicated the management plan with him. -The #1 priority in this patient would be to avoid hypoglycemia.   -He will be sent for new set of labs including A1c. -He will be contacted if labs are abnormal, specifically if he has higher A1c than 8%. -Based on his last visit A1c of 6.3%, consistent with controlled diabetes he would not need any change in his medications today.  -  I advised him to continue Januvia 100 mg p.o. daily with breakfast.   -He does not have enough functionality to monitor blood glucose and inject medications .  He will be considered only for oral medications if he is losing control.  -He is currently on prednisone 40 mg p.o. daily.  might have been started as a result of his sarcoidosis.  He is advised to bring all of his medications with him for reconciliation.  - Patient specific target  A1c;  LDL, HDL, Triglycerides, and  Waist Circumference were discussed in detail.  2) BP/HTN: His blood pressure is uncontrolled at 148/87.  Patient status post CVA with hemiplegia .  He has enough medications to control blood pressure, advised to be consistent in taking his medications.    3) Lipids/HPL: Recent lipid panel with LDL of 43 and triglycerides at 288.  He is advised to continue his Lipitor 10 mg p.o. Nightly.  4)  Weight/Diet: Slightly overweight, cannot exercise optimally.  Detailed carbohydrate information provided.    5) Chronic  Care/Health Maintenance:  -he  is on ACEI/ARB and Statin medications and  is encouraged to continue to follow up with Ophthalmology, Dentist,  Podiatrist at least yearly or according to recommendations, and advised to  stay away from smoking. I have recommended yearly flu vaccine and pneumonia vaccination at least every 5 years;  and  sleep for at least 7 hours a day.  - I advised patient to maintain close follow up with [name], MD for primary care needs.   Follow up plan: - Return in about 4 months (around 10/13/2017) for labs today.  Glade Lloyd, MD [facility] [address] [place], [place] [address] Phone: [phone]  Fax: [phone]    06/13/2017, 12:39 PM  This note was partially dictated with voice recognition software. Similar sounding words can be transcribed inadequately or may not  be corrected  upon review.

## 2017-06-13 NOTE — Patient Instructions (Signed)

## 2017-06-14 LAB — HEMOGLOBIN A1C
EAG (MMOL/L): 7 (calc)
Hgb A1c MFr Bld: 6 % of total Hgb — ABNORMAL HIGH (ref <5.7)
Mean Plasma Glucose: 126 (calc)

## 2017-06-14 LAB — MICROALBUMIN / CREATININE URINE RATIO
CREATININE, URINE: 55 mg/dL (ref 20–320)
MICROALB UR: 0.5 mg/dL
MICROALB/CREAT RATIO: 9 ug/mg{creat} (ref <30)

## 2017-06-19 ENCOUNTER — Ambulatory Visit
Admission: RE | Admit: 2017-06-19 | Discharge: 2017-06-19 | Disposition: A | Discharge status: Home / Self Care | Payer: Medicare Other | Source: Ambulatory Visit | Attending: Neurosurgery | Admitting: Neurosurgery

## 2017-06-19 DIAGNOSIS — M5481 Occipital neuralgia: Secondary | ICD-10-CM

## 2017-06-19 MED ORDER — DEXAMETHASONE SODIUM PHOSPHATE 4 MG/ML IJ SOLN
4.0000 mg | Freq: Once | INTRAMUSCULAR | Status: AC
  Administered 2017-06-19: 4 mg

## 2017-06-19 MED ORDER — IOPAMIDOL (ISOVUE-M 300) INJECTION 61%
1.0000 mL | Freq: Once | INTRAMUSCULAR | Status: AC | PRN
  Administered 2017-06-19: 1 mL via INTRA_ARTICULAR

## 2017-06-19 NOTE — Discharge Instructions (Signed)

## 2017-06-27 ENCOUNTER — Telehealth: Payer: Self-pay | Admitting: Neurology

## 2017-06-27 ENCOUNTER — Ambulatory Visit (INDEPENDENT_AMBULATORY_CARE_PROVIDER_SITE_OTHER): Payer: Medicare Other | Admitting: Neurology

## 2017-06-27 ENCOUNTER — Encounter: Payer: Self-pay | Admitting: Neurology

## 2017-06-27 VITALS — BP 151/90 | HR 79 | Ht 64.0 in | Wt 166.0 lb

## 2017-06-27 DIAGNOSIS — R51 Headache: Secondary | ICD-10-CM

## 2017-06-27 DIAGNOSIS — G8114 Spastic hemiplegia affecting left nondominant side: Secondary | ICD-10-CM | POA: Diagnosis not present

## 2017-06-27 DIAGNOSIS — G40909 Epilepsy, unspecified, not intractable, without status epilepticus: Secondary | ICD-10-CM

## 2017-06-27 DIAGNOSIS — I69398 Other sequelae of cerebral infarction: Secondary | ICD-10-CM | POA: Diagnosis not present

## 2017-06-27 DIAGNOSIS — R519 Headache, unspecified: Secondary | ICD-10-CM | POA: Insufficient documentation

## 2017-06-27 MED ORDER — LAMOTRIGINE ER 200 MG PO TB24: 200.0000 mg | ORAL_TABLET | Freq: Every day | ORAL | 11 refills | Status: DC

## 2017-06-27 MED ORDER — DICLOFENAC POTASSIUM(MIGRAINE) 50 MG PO PACK: 50.0000 mg | PACK | ORAL | 6 refills | Status: DC | PRN

## 2017-06-27 MED ORDER — LAMOTRIGINE 25 MG PO TABS: ORAL_TABLET | ORAL | 0 refills | Status: DC

## 2017-06-27 MED ORDER — ESCITALOPRAM OXALATE 10 MG PO TABS: 10.0000 mg | ORAL_TABLET | Freq: Every day | ORAL | 11 refills | Status: DC

## 2017-06-27 NOTE — Telephone Encounter (Signed)
Returned call to pharmacy and clarified question concerning patient's titration dosage.

## 2017-06-27 NOTE — Telephone Encounter (Signed)
Glen with [employer] requesting a call to discuss the pts prescription for Lamotrigine. Monica Martinez can be reached at [phone]

## 2017-06-27 NOTE — Patient Instructions (Addendum)
  weeks Keppra 500mg   Lamotrigine 25mg   1st 1/1 1/1  2nd 1/1 2/2  3rd 0/1 3/3  4th 0/0 4/4  5th 0/0 Lamotrigine ER 200mg  once at night

## 2017-06-27 NOTE — Progress Notes (Signed)
Chief Complaint  Patient presents with  . Migraine    He is here with his brother, [name] and an interpreter from [place]. He is here for hospital follow up from having migraine on 04/22/17.  He estimates three or less headaches each week.  He had some type of cevical injection for occipital neuralgia last week that was ordered by Dr. Saintclair Halsted.    . Spastic Hemiplegia    Last [name] injection on 02/08/17.        PATIENT: Charles [name] DOB: [birth year]  HISTORICAL  HUSSEIN MACDOUGAL is a 62 years old right-handed male, accompanied by his mother, and his brother, interpreter, for EMG guided Botox injection for his spastic left hemiparesis,I saw him first in Nov 2015, he was previously patients of Dr. Leonie Man, and Dr. Janann Colonel, who has performed EMG guided Botox injection in January, April, July 2015, for his spastic left hemiparesis, which he responded very well  He was born deaf, He had a history of pulmonary sarcoidosis, was treated with long-term low-dose steroid, also with past medical history of hypertension, diabetes, hyperlipidemia,  He suffered stroke in April 2014, was treated at [place], with residual severe left hemiparesis, per record  MRI of the brain showed remote right hemispheric infarct and small vessel disease type changes  MRA of brain abrupt  cutoff of flow at the right carotid terminus with nonvisualization of right middle cerebral artery branch vessels consistent with the patient's remote right hemispheric infarct. Fetal type origin of the posterior cerebral arteries with posterior cerebral artery supplied predominately from the anterior circulation. 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   2D Echocardiogram 60%, wall motion normal, LA normal size Carotid Doppler Bilateral: 1-39% ICA stenosis. Vertebral artery flow is antegrade  TEE no PFO, Pulm AVM  EEG in August 2014, normal  awake and asleep EEG. No focal or generalized epileptiform discharges  noted   UPDATE May 8th 2017: He is with his mother and brother at today's clinical visit, last visit was November 25th 2015 for EMG guided Botox injection to his spastic left upper and lower extremity, he denies significant improvement with Botox injection.  Today he came in with a new issue, complains a year history of intermittent right elbow discomfort, radiating pain to right arm, right hand, mainly involving right fifths, and fourth fingers, it happened with prolonged sitting, eating, or sleeping, he also noticed mild right hand weakness, difficulty to unscrew the bottle top. 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 He complains progressive worsening gait abnormality, falling few times, neck pain, low back pain, radiating pain to his right leg  UPDATE August 24 2015: He is accompanied by his mother brother and interpreter at today's clinical visit, he continue complains of low back pain radiating pain to right lower extremity, intermittent right arm hands paresthesia, worsening gait difficulty, urinary urgency,  We have personally reviewed MRI of cervical spine in June 2017: There is severe spinal stenosis at C3-C4 and at C4-C5 due to central disc protrusions/herniations, uncovertebral spurring, facet hypertrophy and congenitally short pedicles. There is subtle hyperintense signal within the spinal cord on sagittal STIR images just below the C3-C4 interspace but this is not confirmed on axial images. This  could represent a mild cervical myelopathy. 2. There is moderate spinal stenosis at C5-C6 and C6-C7 mild spinal stenosis at C2-C3 to degenerative changes and congenitally short pedicles. 3. At C3-C4 there is  moderately severe bilateral foraminal narrowing at could lead to impingement either of the C4 nerve roots. There does not appear to be nerve root impingement at the other cervical levels  MRI of lumbar spine in June 2017: At L4-L5, there is broad disc bulging, moderately severe facet hypertrophy, right greater than left, and minimal anterolisthesis. There is no nerve root compression at this level. When compared to the MRI dated 12/24/2012, the synovial cyst that was noted to the left on the prior MRI is no longer present. This relieves the left L5 nerve root impingement that was noted at that time. However, since the prior MRI there has been mild progression of the facet hypertrophy and minimal anterolisthesis . 2. There are milder degenerative changes at L3-L4 and L5-S1 that did not lead to any nerve root impingement, unchanged when compared to the previous MRI.  UPDATE Dec 03 2015: He had cervical decompression laminectomy C3-4-5 6 with foraminotomies of the C4-5 VI nerve roots by Dr. Saintclair Halsted on October 07 2015, patient reported significant improvement in his neck pain, family noticed he has difficulty sleeping, agitated.  I personally reviewed MRI cervical spine in September 2017: Posterior hardware fusion C3 through C7. Posterior decompression C3 through C5. Small posterior fluid collection in the soft tissues may represent postop fluid versus CSF leak. Small area of cord hyperintensity at the C4 level is unchanged compatible with chronic myelomalacia. No cord compression.  He complains of low back pain,  MRI of left femur there was no significant abnormality notice, MRI lumbar in June 2017, multilevel degenerative changes, but there was no significant canal or foraminal stenosis   UPDATE Jan 16th 2018: He complains of neck pain, using heating pad, stiff, limited range of motion, lean back to get relief, difficulty to get a comfortable position to sleep, he lost drip of his right hand,  Left leg  pain when bearing weight, left leg tightness,   CAT scan of the brain in August 2017 showed large right MCA stroke  Update Jul 05 2016: He complains of worsening left-sided low back pain, unsteady gait, we have personally reviewed MRI of lumbar in June 2017, only mild degenerative changes, there is no significant canal or foraminal stenosis.  He is taking Mobic/naproxen as needed, worsening spastic left hemiparesis, left hands in position   UPDATE June 5th 2018: He returned for a electronic stimulation guided xeomin injection for spastic left hemiparesis, was emphasize on left upper extremity today,  UPDATE Feb 08 2017: He did respond some to previous xeomin injection 500 units, still has significant left arm spasticity, weakness, left anterior thigh muscle spasm in the achy pain  UPDATE Jun 27 2017: He presented to the emergency room on April 22, 2017, complains of the headaches, apparently he was given cocktail of Compazine, Benadryl, Decadron,  CT head without contrast showed chronic right MCA stroke, no acute abnormality  Laboratory evaluation showed normal TSH, free T4, lipid panel, A1c was mildly elevated 6.0, MRI of left hip on April 26, 2017 showed mild marginal osteophytes on the femoral head, stable since 2017,  He was given right greater occipital nerve perineural steroid injection on June 19, 2017 by Dr. Rolla Flatten, his headache overall has much improved,  REVIEW OF SYSTEMS: Full 14 system review of systems performed and notable only  for as above ALLERGIES: Allergies  Allergen Reactions  . Hydrocodone Other (See Comments)    Makes "looney"  . Shellfish Allergy Hives and Swelling  . Metformin Diarrhea  . Contrast Media [Iodinated Diagnostic Agents] Itching  . Oxycodone Nausea And Vomiting    HOME MEDICATIONS: Current Outpatient Medications on File Prior to Visit  Medication Sig Dispense Refill  . albuterol (PROAIR HFA) 108 (90 Base) MCG/ACT inhaler Inhale 2 puffs  into the lungs every 6 (six) hours as needed for wheezing or shortness of breath.    [name] aspirin 325 MG EC tablet Take 325 mg by mouth daily.    [name] atorvastatin (LIPITOR) 10 MG tablet Take 10 mg by mouth daily. (cholesterol)    . cholecalciferol (VITAMIN D) 1000 units tablet Take 1,000 Units by mouth daily.    . clopidogrel (PLAVIX) 75 MG tablet Take 75 mg by mouth daily.    . cyclobenzaprine (FLEXERIL) 10 MG tablet Take 10 mg by mouth 2 (two) times daily.    . diazepam (VALIUM) 5 MG tablet Take 1 tablet (5 mg total) by mouth 2 (two) times daily. 10 tablet 0  . DULoxetine (CYMBALTA) 60 MG capsule Take 60 mg by mouth daily.    [name] escitalopram (LEXAPRO) 5 MG tablet Take 5 mg by mouth daily.     [name] esomeprazole (NEXIUM) 40 MG capsule Take 1 capsule (40 mg total) by mouth 2 (two) times daily. (Patient taking differently: Take 40 mg by mouth daily. ) 60 capsule 0  . gabapentin (NEURONTIN) 300 MG capsule Take 1 capsule (300 mg total) by mouth 3 (three) times daily. 90 capsule 11  . glucose blood (ONETOUCH VERIO) test strip 1 each by Other route daily. And lancets 1/day 100 each 3  . hydrochlorothiazide (MICROZIDE) 12.5 MG capsule Take 12.5 mg by mouth daily.    [name] levETIRAcetam (KEPPRA) 500 MG tablet Take 1 tablet (500 mg total) by mouth 2 (two) times daily. 60 tablet 0  . losartan (COZAAR) 50 MG tablet Take 50 mg by mouth daily.    . Melatonin 5 MG TABS Take 5 mg by mouth at bedtime.    . mometasone-formoterol (DULERA) 100-5 MCG/ACT AERO Inhale 2 puffs into the lungs every 12 (twelve) hours. 1 Inhaler 11  . naproxen (NAPROSYN) 375 MG tablet Take 375 mg by mouth 2 (two) times daily with a meal.    . oxybutynin (DITROPAN XL) 15 MG 24 hr tablet Take 15 mg by mouth at bedtime.    . predniSONE (DELTASONE) 10 MG tablet Take 4 tablets (40 mg total) by mouth daily. 16 tablet 0  . sitaGLIPtin (JANUVIA) 100 MG tablet Take 100 mg by mouth daily.    [name] tiotropium (SPIRIVA HANDIHALER) 18 MCG inhalation capsule Place 18  mcg into inhaler and inhale daily.     [name] tiZANidine (ZANAFLEX) 2 MG tablet Take 1 tablet (2 mg total) by mouth 3 (three) times daily. (Patient taking differently: Take 4 mg by mouth 2 (two) times daily. ) 60 tablet 6  . traMADol (ULTRAM) 50 MG tablet Take 1 tablet (50 mg total) by mouth every 6 (six) hours as needed. 15 tablet 0   No current facility-administered medications on file prior to visit.     PAST MEDICAL HISTORY: Past Medical History:  Diagnosis Date  . Anemia   . Asthma   . Deaf   . Essential hypertension   . GERD (gastroesophageal reflux disease)   . History of migraine   . History of stroke  Spastic hemiplegia, right MCA stroke April 2014 in [place]  . Mixed hyperlipidemia   . Other pancytopenia ([place]) 12/23/2015  . PAD (peripheral artery disease) ([place])   . Sarcoidosis   . Seizures ([place])   . Type 2 diabetes mellitus ([place])     PAST SURGICAL HISTORY: Past Surgical History:  Procedure Laterality Date  . BRAVO PH STUDY N/A 06/18/2013   pH STUDY SHOWS NEXIUM TWICE DAILY CONTROLS THE ACID IN HIS STOMACH. HE HAD VERY FEWEPISODE OF REGURGITATION RECORDED IN THE 2 DAYS THE STUDY WAS PERFORMED.   [name] COLONOSCOPY  2011   IN [place]  . 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   . ESOPHAGOGASTRODUODENOSCOPY N/A 05/05/2015   Procedure: ESOPHAGOGASTRODUODENOSCOPY (EGD);  Surgeon: Danie Binder, MD;  Location: [facility];  Service: Endoscopy;  Laterality: N/A;  730  . MALONEY DILATION N/A 06/18/2013   Procedure: MALONEY DILATION;  Surgeon: Danie Binder, MD;  Location: [facility];  Service: Endoscopy;  Laterality: N/A;  . OTHER SURGICAL HISTORY     cyst removal head and chest  .  POSTERIOR CERVICAL FUSION/FORAMINOTOMY N/A 10/07/2015   Procedure: Cervical Three-Four, Cervical Four-Five, Cervical Five-Six, Cervical Six-Seven Posterior Cervical Laminectomy and Fusion;  Surgeon: Kary Kos, MD;  Location: [facility];  Service: Neurosurgery;  Laterality: N/A;  . SAVORY DILATION N/A 06/18/2013   Procedure: SAVORY DILATION;  Surgeon: Danie Binder, MD;  Location: [facility];  Service: Endoscopy;  Laterality: N/A;  . TEE WITHOUT CARDIOVERSION N/A 10/04/2012   Procedure: TRANSESOPHAGEAL ECHOCARDIOGRAM (TEE);  Surgeon: Thayer Headings, MD;  Location: [facility];  Service: Cardiovascular;  Laterality: N/A;  . [place] accident  Both legs fractured and repaired surgically    FAMILY HISTORY: Family History  Problem Relation Age of Onset  . Diabetes Mother   . Hypertension Mother   . Diabetes Father   . Hypertension Father   . Colon polyps Neg Hx   . Colon cancer Neg Hx     SOCIAL HISTORY:  Social History   Socioeconomic History  . Marital status: Single    Spouse name: Not on file  . Number of children: 0  . Years of education: HS  . Highest education level: Not on file  Occupational History  . Occupation: Disabled  Social Needs  . Financial resource strain: Not on file  . Food insecurity:    Worry: Not on file    Inability: Not on file  . Transportation needs:    Medical: Not on file    Non-medical: Not on file  Tobacco Use  . Smoking status: Former Smoker    Packs/day: 1.00    Years: 29.00    Pack years: 29.00    Types: Cigarettes    Last attempt to quit: 02/22/1992    Years since quitting: 25.3  . Smokeless tobacco: Never Used  Substance and Sexual Activity  . Alcohol use: Yes    Alcohol/week: 0.6 - 1.2 oz    Types: 1 - 2 Cans of beer per week    Comment: occasionally, every 2-3 days  . Drug use: Yes    Types: Marijuana, "Crack" cocaine    Comment: hx of crack cocaine "long time ago" per pt -  hard to tell how long  it is has been since he used  . Sexual activity: Never  Lifestyle  . Physical activity:    Days per week: Not on file    Minutes per session: Not on file  . Stress: Not on file  Relationships  . Social connections:    Talks on phone: Not on file    Gets together: Not on file    Attends religious service: Not on file    Active member of club or organization: Not on file    Attends meetings of clubs or organizations: Not on file    Relationship status: Not on file  . Intimate partner violence:    Fear of current or ex partner: Not on file    Emotionally abused: Not on file    Physically abused: Not on file    Forced sexual activity: Not on file  Other Topics Concern  . Not on file  Social History Narrative   Patient lives at home with his family. Brother, sister, mother    Patient does not work.    Patient has a high school education.    Patient has one step child      PHYSICAL EXAM   Vitals:   06/27/17 1010  BP: (!) 151/90  Pulse: 79  Weight: 166 lb (75.3 kg)  Height: 5\' 4"  (1.626 m)    Not recorded      Body mass index is 28.49 kg/m.   PHYSICAL EXAMNIATION:  Gen: NAD, conversant, well nourised, obese, well groomed                     Cardiovascular: Regular rate rhythm, no peripheral edema, warm, nontender. Eyes: Conjunctivae clear without exudates or hemorrhage Neck: Supple, no carotid bruise. Pulmonary: Clear to auscultation bilaterally   NEUROLOGICAL EXAM:  MENTAL STATUS: Speech: He is mute depend on interpreter for history,   CRANIAL NERVES: CN II: Visual fields are full to confrontation. Fundoscopic exam is normal with sharp discs and no vascular changes. Pupils are round equal and briskly reactive to light. CN III, IV, VI: extraocular movement are normal. No ptosis. CN V: Facial sensation is intact to pinprick in all 3 divisions bilaterally. Corneal responses are intact.  CN VII: Face is symmetric with normal eye closure and smile. CN VIII:  deaf CN IX, X: Palate elevates symmetrically. Phonation is normal. CN XI: Head turning and shoulder shrug are intact CN XII: Tongue is midline with normal movements and no atrophy.  MOTOR: Spastic left hemiparesis, wearing left ankle brace, mild left shoulder antigravity movement left shoulder abduction with passive movement maximum 90, left elbow 170, complains of left elbow pain with passive movement, left thumb in position, left hip flexion 3, left knee extension 4, left knee flexion 4, he also has mild right ankle dorsiflexion weakness.  He has right elbow Tinel signs, mild right finger abduction weakness, mild right hand grip weakness  REFLEXES:  Hyperreflexia of both side, worse on the left side, bilateral Babinski sign.  SENSORY: Intact to light touch, pinprick, positional and vibratory sensation are intact in fingers and toes, with exception of decreased light touch in the right fifth, and half of right fourth finger extending to right ulnar palmar, dorsum of right hand  COORDINATION: There is no dysmetria on right finger-to-nose and heel-knee-shin.    GAIT/STANCE: Rely on his 4 foot cane, multiple tends to get up from seated position, left hemi-circumferential, unsteady gait  Lab Results  Component Value Date   WBC 5.1 04/22/2017   HGB 15.6 04/22/2017   HCT 46.0 04/22/2017   MCV 88.5 04/22/2017   PLT 121 (L) 04/22/2017      Component Value Date/Time   NA 141 04/22/2017 2004   K 3.5 04/22/2017 2004   CL 98 (L) 04/22/2017 2004   CO2 28 03/04/2017 1224   GLUCOSE 81 04/22/2017 2004   BUN 14 04/22/2017 2004   CREATININE 1.20 04/22/2017 2004   CALCIUM 9.1 03/04/2017 1224   PROT 7.2 09/12/2016 1428   ALBUMIN 4.2 09/12/2016 1428   AST 27 09/12/2016 1428   ALT 25 09/12/2016 1428   ALKPHOS 60 09/12/2016 1428   BILITOT 0.6 09/12/2016 1428   GFRNONAA >60 03/04/2017 1224   GFRAA >60 03/04/2017 1224   Lab Results  Component Value Date   CHOL 160 09/13/2016   HDL  59 09/13/2016   LDLCALC 43 09/13/2016   TRIG 288 (H) 09/13/2016   CHOLHDL 2.7 09/13/2016   Lab Results  Component Value Date   HGBA1C 6.0 (H) 06/13/2017   Lab Results  Component Value Date   VITAMINB12 405 09/12/2016   Lab Results  Component Value Date   TSH 1.83 06/13/2017      ASSESSMENT AND PLAN  RAYNALD ROUILLARD is a 62 y.o. male    Cervical spondylitic myelopathy  Status post cervical decompression surgery in August 2017, Continued to complains of neck pain  I have encouraged him neck stretching exercise, hot compression  Continue gabapentin 300 mg 3 times a day  Tizanidine 2 mg 3 times a day as needed,  Tramadol, Mobic as needed for pain  Keep Cymbalta 60 mg daily  Return to clinic in 4 week  Low back pain, left anterior thigh and hip pain, gait abnormality  Musculoskeletal etiology  Continue hot compression  Mobic 7.5 milligrams twice a day,  Tramadol as needed  Refer him to physical therapy  Congenital deaf, use sign language, history is through interpreter   History of stroke, with residual spastic left hemiparesis   Continue electrical stimulation guided xeomin injection,       Headache with migraine features. History of seizure,  Difficulty tolerating Keppra, will start lamotrigine, titrating to 100 mg twice a day  Cambia as needed.    Marcial Pacas, M.D. Ph.D.  [facility] [address], [place], [place] [address] [phone]

## 2017-06-28 ENCOUNTER — Telehealth: Payer: Self-pay | Admitting: *Deleted

## 2017-06-28 MED ORDER — DICLOFENAC POTASSIUM(MIGRAINE) 50 MG PO PACK: PACK | ORAL | 6 refills | Status: DC

## 2017-06-28 NOTE — Telephone Encounter (Signed)
PA approved for Cambia by [employer] [phone]) through 06/28/2018.  TC#[id number].  Pt [phone].  His plan will only cover #9 per 30 days.  Pharmacy notified.

## 2017-06-29 ENCOUNTER — Other Ambulatory Visit: Payer: Self-pay | Admitting: *Deleted

## 2017-06-29 MED ORDER — LAMOTRIGINE 100 MG PO TABS: 100.0000 mg | ORAL_TABLET | Freq: Every day | ORAL | 11 refills | Status: DC

## 2017-07-06 DIAGNOSIS — M545 Low back pain: Secondary | ICD-10-CM | POA: Diagnosis not present

## 2017-07-06 DIAGNOSIS — M461 Sacroiliitis, not elsewhere classified: Secondary | ICD-10-CM | POA: Diagnosis not present

## 2017-07-11 ENCOUNTER — Telehealth: Payer: Self-pay | Admitting: *Deleted

## 2017-07-11 NOTE — Telephone Encounter (Signed)
Called and spoke w/ [facility]. They stated patient just picked up rx Lamotrigine 100mg  w/ titration instructions on 07/05/17. This titration will be done over 4 weeks to work up to 100mg  BID. Too early to fill rx lamotrigine 100mg  BID d/t pt recently picking up titration rx. They will try and re-run next month to see if they get a paid claim. They will call us with any further questions.  Pharmacy benefit info if needed for future PA's: Patient [phone].  RxBIN: [id number], RxPCN: [id number], RxGroup: [id number].

## 2017-07-14 DIAGNOSIS — R45851 Suicidal ideations: Secondary | ICD-10-CM | POA: Diagnosis not present

## 2017-07-14 DIAGNOSIS — S20212A Contusion of left front wall of thorax, initial encounter: Secondary | ICD-10-CM | POA: Diagnosis not present

## 2017-07-14 DIAGNOSIS — F4320 Adjustment disorder, unspecified: Secondary | ICD-10-CM | POA: Diagnosis not present

## 2017-07-14 DIAGNOSIS — I10 Essential (primary) hypertension: Secondary | ICD-10-CM | POA: Diagnosis not present

## 2017-07-14 DIAGNOSIS — F4329 Adjustment disorder with other symptoms: Secondary | ICD-10-CM | POA: Diagnosis not present

## 2017-07-14 DIAGNOSIS — Y999 Unspecified external cause status: Secondary | ICD-10-CM | POA: Diagnosis not present

## 2017-07-14 DIAGNOSIS — M25572 Pain in left ankle and joints of left foot: Secondary | ICD-10-CM | POA: Diagnosis not present

## 2017-07-14 DIAGNOSIS — D696 Thrombocytopenia, unspecified: Secondary | ICD-10-CM | POA: Diagnosis not present

## 2017-07-14 DIAGNOSIS — R42 Dizziness and giddiness: Secondary | ICD-10-CM | POA: Diagnosis not present

## 2017-07-14 DIAGNOSIS — X58XXXA Exposure to other specified factors, initial encounter: Secondary | ICD-10-CM | POA: Diagnosis not present

## 2017-07-14 DIAGNOSIS — F99 Mental disorder, not otherwise specified: Secondary | ICD-10-CM | POA: Diagnosis not present

## 2017-07-14 DIAGNOSIS — F17220 Nicotine dependence, chewing tobacco, uncomplicated: Secondary | ICD-10-CM | POA: Diagnosis not present

## 2017-07-17 DIAGNOSIS — F4329 Adjustment disorder with other symptoms: Secondary | ICD-10-CM | POA: Diagnosis not present

## 2017-07-18 DIAGNOSIS — F4320 Adjustment disorder, unspecified: Secondary | ICD-10-CM | POA: Diagnosis not present

## 2017-07-18 DIAGNOSIS — F4329 Adjustment disorder with other symptoms: Secondary | ICD-10-CM | POA: Diagnosis not present

## 2017-07-22 DIAGNOSIS — F4320 Adjustment disorder, unspecified: Secondary | ICD-10-CM | POA: Diagnosis not present

## 2017-07-24 DIAGNOSIS — F4320 Adjustment disorder, unspecified: Secondary | ICD-10-CM | POA: Diagnosis not present

## 2017-07-25 DIAGNOSIS — M25572 Pain in left ankle and joints of left foot: Secondary | ICD-10-CM | POA: Diagnosis not present

## 2017-07-26 ENCOUNTER — Telehealth: Payer: Self-pay | Admitting: Neurology

## 2017-07-26 NOTE — Telephone Encounter (Signed)
[name] will be B/B, NPR. Medicare will just need to be ran in the system to verify it is still active.

## 2017-08-01 NOTE — Telephone Encounter (Signed)
Patient's xeomin is still active .

## 2017-08-02 NOTE — Telephone Encounter (Signed)
Faxed office notes to [place] track for Medicaid.

## 2017-08-08 DIAGNOSIS — M545 Low back pain: Secondary | ICD-10-CM | POA: Diagnosis not present

## 2017-08-08 DIAGNOSIS — M533 Sacrococcygeal disorders, not elsewhere classified: Secondary | ICD-10-CM | POA: Diagnosis not present

## 2017-08-09 DIAGNOSIS — E1165 Type 2 diabetes mellitus with hyperglycemia: Secondary | ICD-10-CM | POA: Diagnosis not present

## 2017-08-09 DIAGNOSIS — G4700 Insomnia, unspecified: Secondary | ICD-10-CM | POA: Diagnosis not present

## 2017-08-09 DIAGNOSIS — I10 Essential (primary) hypertension: Secondary | ICD-10-CM | POA: Diagnosis not present

## 2017-08-09 DIAGNOSIS — Z299 Encounter for prophylactic measures, unspecified: Secondary | ICD-10-CM | POA: Diagnosis not present

## 2017-08-09 DIAGNOSIS — Z6828 Body mass index (BMI) 28.0-28.9, adult: Secondary | ICD-10-CM | POA: Diagnosis not present

## 2017-08-09 DIAGNOSIS — K219 Gastro-esophageal reflux disease without esophagitis: Secondary | ICD-10-CM | POA: Diagnosis not present

## 2017-08-09 DIAGNOSIS — F419 Anxiety disorder, unspecified: Secondary | ICD-10-CM | POA: Diagnosis not present

## 2017-08-10 ENCOUNTER — Ambulatory Visit: Payer: Medicare Other | Admitting: Podiatry

## 2017-08-16 ENCOUNTER — Ambulatory Visit: Payer: Medicare Other | Admitting: Neurology

## 2017-08-16 ENCOUNTER — Telehealth: Payer: Self-pay | Admitting: Neurology

## 2017-08-16 NOTE — Telephone Encounter (Signed)
Noted  

## 2017-08-16 NOTE — Telephone Encounter (Signed)
ERROR

## 2017-08-16 NOTE — Telephone Encounter (Signed)
Queen(which is the Resident Care Coordinator) @ [facility]([phone]) states she was not aware of this appointment.  She states pt will not make appointment and she is asking for a call back to discuss.  The # is her cell # to call because she will be in and out of office.  Please leave message and she will return call

## 2017-08-16 NOTE — Telephone Encounter (Signed)
I called the number provided but Charles [name] did not answer. I left her a general message asking her to call back.

## 2017-08-17 ENCOUNTER — Encounter: Payer: Self-pay | Admitting: Neurology

## 2017-08-17 DIAGNOSIS — F99 Mental disorder, not otherwise specified: Secondary | ICD-10-CM | POA: Diagnosis not present

## 2017-08-17 NOTE — Telephone Encounter (Signed)
I returned queens call but she did not answer so I left a VM asking her to call me back.

## 2017-08-17 NOTE — Telephone Encounter (Signed)
[facility] resident care coordinator returning Danielle's call.

## 2017-08-22 DIAGNOSIS — M5440 Lumbago with sciatica, unspecified side: Secondary | ICD-10-CM | POA: Diagnosis not present

## 2017-08-22 NOTE — Telephone Encounter (Signed)
Patient is scheduled   

## 2017-08-23 NOTE — Telephone Encounter (Signed)
Noted, thank you

## 2017-08-28 DIAGNOSIS — M533 Sacrococcygeal disorders, not elsewhere classified: Secondary | ICD-10-CM | POA: Diagnosis not present

## 2017-08-30 ENCOUNTER — Encounter: Payer: Self-pay | Admitting: Neurology

## 2017-08-30 ENCOUNTER — Ambulatory Visit (INDEPENDENT_AMBULATORY_CARE_PROVIDER_SITE_OTHER): Payer: Medicare Other | Admitting: Neurology

## 2017-08-30 VITALS — BP 185/100 | HR 72 | Ht 64.0 in | Wt 166.0 lb

## 2017-08-30 DIAGNOSIS — G8114 Spastic hemiplegia affecting left nondominant side: Secondary | ICD-10-CM | POA: Diagnosis not present

## 2017-08-30 MED ORDER — INCOBOTULINUMTOXINA 100 UNITS IM SOLR
500.0000 [IU] | INTRAMUSCULAR | Status: DC
  Administered 2017-08-30: 500 [IU] via INTRAMUSCULAR

## 2017-08-30 MED ORDER — LAMOTRIGINE 100 MG PO TABS: 100.0000 mg | ORAL_TABLET | Freq: Two times a day (BID) | ORAL | 11 refills | Status: DC

## 2017-08-30 MED ORDER — VITAMIN D3 25 MCG (1000 UT) PO CAPS: 1000.0000 [IU] | ORAL_CAPSULE | Freq: Every day | ORAL | 4 refills | Status: DC

## 2017-08-30 NOTE — Progress Notes (Signed)
**  [name] 100 units x 5 vials, NDC 4458-4835-07, Lot [id number], Exp 10/2019, office supply.//mck,rn**
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
## 2017-08-31 DIAGNOSIS — M545 Low back pain: Secondary | ICD-10-CM | POA: Diagnosis not present

## 2017-09-05 DIAGNOSIS — M545 Low back pain: Secondary | ICD-10-CM | POA: Diagnosis not present

## 2017-09-06 DIAGNOSIS — M79675 Pain in left toe(s): Secondary | ICD-10-CM | POA: Diagnosis not present

## 2017-09-06 DIAGNOSIS — M79674 Pain in right toe(s): Secondary | ICD-10-CM | POA: Diagnosis not present

## 2017-09-06 DIAGNOSIS — B351 Tinea unguium: Secondary | ICD-10-CM | POA: Diagnosis not present

## 2017-09-07 DIAGNOSIS — M545 Low back pain: Secondary | ICD-10-CM | POA: Diagnosis not present

## 2017-09-08 ENCOUNTER — Encounter: Payer: Self-pay | Admitting: Emergency Medicine

## 2017-09-08 ENCOUNTER — Emergency Department: Payer: Medicare Other

## 2017-09-08 ENCOUNTER — Other Ambulatory Visit: Payer: Self-pay

## 2017-09-08 ENCOUNTER — Emergency Department
Admission: EM | Admit: 2017-09-08 | Discharge: 2017-09-08 | Disposition: A | Discharge status: Home / Self Care | Payer: Medicare Other | Attending: Emergency Medicine | Admitting: Emergency Medicine

## 2017-09-08 DIAGNOSIS — S199XXA Unspecified injury of neck, initial encounter: Secondary | ICD-10-CM | POA: Diagnosis not present

## 2017-09-08 DIAGNOSIS — Z8673 Personal history of transient ischemic attack (TIA), and cerebral infarction without residual deficits: Secondary | ICD-10-CM | POA: Insufficient documentation

## 2017-09-08 DIAGNOSIS — M542 Cervicalgia: Secondary | ICD-10-CM | POA: Diagnosis not present

## 2017-09-08 DIAGNOSIS — I10 Essential (primary) hypertension: Secondary | ICD-10-CM | POA: Insufficient documentation

## 2017-09-08 DIAGNOSIS — Y9389 Activity, other specified: Secondary | ICD-10-CM | POA: Insufficient documentation

## 2017-09-08 DIAGNOSIS — W19XXXA Unspecified fall, initial encounter: Secondary | ICD-10-CM

## 2017-09-08 DIAGNOSIS — E119 Type 2 diabetes mellitus without complications: Secondary | ICD-10-CM | POA: Insufficient documentation

## 2017-09-08 DIAGNOSIS — Y998 Other external cause status: Secondary | ICD-10-CM | POA: Insufficient documentation

## 2017-09-08 DIAGNOSIS — Z87891 Personal history of nicotine dependence: Secondary | ICD-10-CM | POA: Diagnosis not present

## 2017-09-08 DIAGNOSIS — Y92192 Bathroom in other specified residential institution as the place of occurrence of the external cause: Secondary | ICD-10-CM | POA: Diagnosis not present

## 2017-09-08 DIAGNOSIS — R51 Headache: Secondary | ICD-10-CM | POA: Diagnosis not present

## 2017-09-08 DIAGNOSIS — J45909 Unspecified asthma, uncomplicated: Secondary | ICD-10-CM | POA: Insufficient documentation

## 2017-09-08 DIAGNOSIS — R42 Dizziness and giddiness: Secondary | ICD-10-CM | POA: Diagnosis not present

## 2017-09-08 DIAGNOSIS — W01198A Fall on same level from slipping, tripping and stumbling with subsequent striking against other object, initial encounter: Secondary | ICD-10-CM | POA: Insufficient documentation

## 2017-09-08 DIAGNOSIS — S0990XA Unspecified injury of head, initial encounter: Secondary | ICD-10-CM | POA: Diagnosis not present

## 2017-09-08 MED ORDER — DIAZEPAM 2 MG PO TABS: 2.0000 mg | ORAL_TABLET | Freq: Three times a day (TID) | ORAL | 0 refills | Status: DC | PRN

## 2017-09-08 MED ORDER — ACETAMINOPHEN 500 MG PO TABS
1000.0000 mg | ORAL_TABLET | Freq: Once | ORAL | Status: AC
  Administered 2017-09-08: 1000 mg via ORAL
  Filled 2017-09-08: qty 2

## 2017-09-08 NOTE — ED Notes (Signed)
[facility] called and message left with Betsey Amen to notify that patient was being seen and treated through the ED this morning.

## 2017-09-08 NOTE — Discharge Instructions (Addendum)
You may take Tylenol for your pain, and Valium as needed for muscle spasms.  Please be careful when taking Valium, as this medication can make you sleepy and increase your chance of falling.  Return to the emergency department if you develop severe pain, nausea or vomiting, new numbness tingling or weakness, or any other symptoms concerning to you.

## 2017-09-08 NOTE — ED Notes (Signed)
09/08/17--250pm.  [facility] DSS Melissa Price called.  I gave her a report on patient and that patient had already gone back to [facility].

## 2017-09-08 NOTE — ED Notes (Signed)
AAOx3.  Skin warm and dry.  NAD 

## 2017-09-08 NOTE — ED Notes (Signed)
Report called to [facility].  [facility] will be here in about 30 minutes to pick patient up,.

## 2017-09-08 NOTE — ED Provider Notes (Signed)
[facility] Emergency Department Provider Note  ____________________________________________  Time seen: Approximately 7:54 AM  I have reviewed the triage vital signs and the nursing notes.   HISTORY  Chief Complaint Fall  History and physical were aided by the use of a sign language interpreter.  HPI Charles [name] is a 62 y.o. male a history of deafness, remote stroke with spastic hemiplegia in the left side, history of spinal stenosis with C3-C4, C4-C5, C5-C6, C 67 fusion remotely, presenting with neck pain after fall.  The patient reports that he was putting on his shoes in the bathroom and 1 of the shoes twisted, which caused him to fall backwards and hit the back of his neck on a towel rack.  He did not fall or lose consciousness.  He is having pain diffusely in the neck both in the midline and laterally without any numbness tingling or weakness.  He denies any headache, nausea or vomiting.   Past Medical History:  Diagnosis Date  . Anemia   . Asthma   . Deaf   . Essential hypertension   . GERD (gastroesophageal reflux disease)   . History of migraine   . History of stroke    Spastic hemiplegia, right MCA stroke April 2014 in [place]  . Mixed hyperlipidemia   . Other pancytopenia ([place]) 12/23/2015  . PAD (peripheral artery disease) ([place])   . Sarcoidosis   . Seizures ([place])   . Type 2 diabetes mellitus [facility])     Patient Active Problem List   Diagnosis Date Noted  . Nonintractable headache 06/27/2017  . Mixed hyperlipidemia 05/17/2017  . Uncontrolled type 2 diabetes mellitus with hyperglycemia ([place]) 05/16/2017  . Thrombocytopenia ([place]) 09/13/2016  . Hypoglycemia   . Syncope 09/12/2016  . Seizure disorder as sequela of cerebrovascular accident ([place]) 09/12/2016  . MDD (major depressive disorder), recurrent episode, severe ([place]) 12/30/2015  . Suicidal ideation   . Other pancytopenia ([place]) 12/23/2015  . Loss of weight 11/12/2015  .  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 Iron deficiency anemia 09/22/2015  . Spinal stenosis in cervical region 08/21/2015  . Neck pain 08/12/2015  . Low back pain 08/12/2015  . Abnormality of gait 08/12/2015  . Neuropathy of right upper extremity 06/29/2015  . Acute rhinitis 05/05/2015  . Abdominal pain, epigastric 04/23/2015  . Arm pain, right 04/23/2015  . Atypical chest pain 07/23/2013  . 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 Dyspepsia 06/13/2013  . Dysphagia 06/13/2013  . Headache 11/30/2012  . Solitary pulmonary nodule 11/20/2012  . Spastic hemiplegia affecting left nondominant side ([place]) 11/08/2012  . Essential hypertension, benign 10/12/2012  . COPD GOLD II with reversibility  10/12/2012  . 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 CVA (cerebral vascular accident) ([place]) 10/03/2012  . Diabetes ([place]) 10/03/2012  . Sarcoidosis ([place]) 10/03/2012    Past Surgical History:  Procedure Laterality Date  . BRAVO PH STUDY N/A 06/18/2013   pH STUDY SHOWS NEXIUM TWICE DAILY CONTROLS THE ACID IN HIS STOMACH. HE HAD VERY FEWEPISODE OF REGURGITATION RECORDED IN THE 2 DAYS THE STUDY WAS PERFORMED.   [name] COLONOSCOPY  2011   [place]  . 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   . ESOPHAGOGASTRODUODENOSCOPY N/A 05/05/2015   Procedure: ESOPHAGOGASTRODUODENOSCOPY (EGD);  Surgeon: Carlyon Prows  Rexene Edison, MD;  [facility];  Service: Endoscopy;  Laterality: N/A;  730  . MALONEY DILATION N/A 06/18/2013   Procedure: MALONEY DILATION;  Surgeon: Danie Binder, MD;  Location: [facility];  Service: Endoscopy;  Laterality: N/A;  . OTHER SURGICAL HISTORY     cyst removal head and chest  . POSTERIOR  CERVICAL FUSION/FORAMINOTOMY N/A 10/07/2015   Procedure: Cervical Three-Four, Cervical Four-Five, Cervical Five-Six, Cervical Six-Seven Posterior Cervical Laminectomy and Fusion;  Surgeon: Kary Kos, MD;  Location: [facility];  Service: Neurosurgery;  Laterality: N/A;  . SAVORY DILATION N/A 06/18/2013   Procedure: SAVORY DILATION;  Surgeon: Danie Binder, MD;  Location: [facility];  Service: Endoscopy;  Laterality: N/A;  . TEE WITHOUT CARDIOVERSION N/A 10/04/2012   Procedure: TRANSESOPHAGEAL ECHOCARDIOGRAM (TEE);  Surgeon: Thayer Headings, MD;  Location: [facility];  Service: Cardiovascular;  Laterality: N/A;  . [place] accident  Both legs fractured and repaired surgically    Current Outpatient Rx  . Order #: [id number] Class: Historical Med  . Order #: [id number] Class: Historical Med  . Order #: [id number] Class: Historical Med  . Order #: [id number] Class: Historical Med  . Order #: [id number] Class: Print  . Order #: [id number] Class: Historical Med  . Order #: [id number] Class: Print  . Order #: [id number] Class: Normal  . Order #: [id number] Class: Normal  . Order #: [id number] Class: Print  . Order #: [id number] Class: Historical Med  . Order #: [id number] Class: Historical Med  . Order #: [id number] Class: Print  . Order #: [id number] Class: Historical Med    Allergies Hydrocodone; Shellfish allergy; Metformin; Contrast media [iodinated diagnostic agents]; and Oxycodone  Family History  Problem Relation Age of Onset  . Diabetes Mother   . Hypertension Mother   . Diabetes Father   . Hypertension Father   . Colon polyps Neg Hx   . Colon cancer Neg Hx     Social History Social History   Tobacco Use  . Smoking status: Former Smoker    Packs/day: 1.00    Years: 29.00    Pack years: 29.00    Types: Cigarettes    Last attempt to quit: 02/22/1992    Years since quitting: 25.5  . Smokeless tobacco: Never Used  Substance Use Topics  . Alcohol use: Yes     Alcohol/week: 0.6 - 1.2 oz    Types: 1 - 2 Cans of beer per week    Comment: occasionally, every 2-3 days  . Drug use: Yes    Types: Marijuana, "Crack" cocaine    Comment: hx of crack cocaine "long time ago" per pt - hard to tell how long it is has been since he used    Review of Systems Constitutional: No fever/chills.  No lightheadedness or syncope.  Positive fall.  No loss of consciousness. Eyes: No visual changes.  Blurred or double vision. ENT: No congestion or rhinorrhea. Cardiovascular: Denies chest pain. Denies palpitations. Respiratory: Denies shortness of breath.  No cough. Gastrointestinal: No abdominal pain.  No nausea, no vomiting.  No diarrhea.  No constipation. Musculoskeletal: Negative for back pain.  Positive for neck pain.  No extremity pain. Skin: Negative for rash. Neurological: Negative for headaches.  Baseline left-sided hemiplegia.  No new numbness tingling or weakness    ____________________________________________   PHYSICAL EXAM:  VITAL SIGNS: ED Triage Vitals [09/08/17 0739]  Enc Vitals Group     BP      Pulse      Resp  Temp      Temp src      SpO2      Weight 166 lb (75.3 kg)     Height 5\' 4"  (1.626 m)     Head Circumference      Peak Flow      Pain Score 7     Pain Loc      Pain Edu?      Excl. in [place]?     Constitutional: Alert and oriented. Answers questions appropriately.  Tonically ill-appearing and uncomfortable but nontoxic.  GCS is 15. Eyes: Conjunctivae are normal.  EOMI. No scleral icterus.  No raccoon eyes. Head: Atraumatic.  No battle sign.  No evidence of contusion, ecchymosis over the scalp or face. Nose: No congestion/rhinnorhea.  No swelling or septal hematoma. Mouth/Throat: Mucous membranes are moist.  No dental injury or malocclusion. Neck: No stridor.  6 inch posterior midline surgical scar that is well-healed.  Positive mild stiffness.  Positive diffuse tenderness to palpation in the entirety of the midline as well  as bilaterally on both sides.  No focality.  No palpable step-offs or deformities. Cardiovascular: Normal rate, regular rhythm. No murmurs, rubs or gallops.  Respiratory: Normal respiratory effort.  No accessory muscle use or retractions. Lungs CTAB.  No wheezes, rales or ronchi. Gastrointestinal: Soft, nontender and nondistended.  No guarding or rebound.  No peritoneal signs. Musculoskeletal: No LE edema.  No midline thoracic or lumbar spine tenderness to palpation, step-offs or deformities. Neurologic: alert and answering questions appropriately.  The patient is deaf but able to comprehend and express thoughts normally.  Complete paralysis of the left upper extremity and minimal movement in the left lower extremity.  Full strength in the right upper and right lower extremity.   Skin:  Skin is warm, dry and intact. No rash noted. Psychiatric: Mood and affect are normal. Speech and behavior are normal.  Normal judgement.  ____________________________________________   LABS (all labs ordered are listed, but only abnormal results are displayed)  Labs Reviewed - No data to display ____________________________________________  EKG  ED ECG REPORT I, Eula Listen, the attending physician, personally viewed and interpreted this ECG.   Date: 09/08/2017  EKG Time: 820  Rate: 60  Rhythm: normal sinus rhythm  Axis: normal  Intervals:none  ST&T Change: No STEMI  ____________________________________________  RADIOLOGY  Ct Head Wo Contrast  Result Date: 09/08/2017 CLINICAL DATA:  Dizziness and fall this morning with a blow to the back of the head. Headache and neck pain. Initial encounter. EXAM: CT HEAD WITHOUT CONTRAST CT CERVICAL SPINE WITHOUT CONTRAST TECHNIQUE: Multidetector CT imaging of the head and cervical spine was performed following the standard protocol without intravenous contrast. Multiplanar CT image reconstructions of the cervical spine were also generated. COMPARISON:   Head and cervical spine CT scan 03/24/2017. FINDINGS: CT HEAD FINDINGS Brain: No evidence of acute infarction, hemorrhage, hydrocephalus, extra-axial collection or mass lesion/mass effect. Remote right MCA territory infarct and extensive chronic microvascular ischemic change are again seen. Vascular: Atherosclerosis noted. Skull: Intact. No focal lesion. Remote fracture of the medial wall of the right orbit is incidentally noted and unchanged. Sinuses/Orbits: Negative. Other: None. CT CERVICAL SPINE FINDINGS Alignment: Reversal of lordosis is unchanged.  No listhesis. Skull base and vertebrae: No acute fracture. No primary bone lesion or focal pathologic process. The patient is status post C3-7 posterior fusion and laminectomy. Soft tissues and spinal canal: No prevertebral fluid or swelling. No visible canal hematoma. Disc levels: Degenerative disc disease most notable  at C3-4, C5-6 and C6-7 is stable in appearance. Upper chest: Lung apices are clear.  Emphysema noted. Other: None. IMPRESSION: No acute abnormality head or cervical spine. Atrophy, chronic microvascular ischemic change and remote right MCA infarct. Status post C3-7 laminectomy and posterior fusion. Emphysema. Atherosclerosis. Electronically Signed   By: Inge Rise M.D.   On: 09/08/2017 08:46   Ct Cervical Spine Wo Contrast  Result Date: 09/08/2017 CLINICAL DATA:  Dizziness and fall this morning with a blow to the back of the head. Headache and neck pain. Initial encounter. EXAM: CT HEAD WITHOUT CONTRAST CT CERVICAL SPINE WITHOUT CONTRAST TECHNIQUE: Multidetector CT imaging of the head and cervical spine was performed following the standard protocol without intravenous contrast. Multiplanar CT image reconstructions of the cervical spine were also generated. COMPARISON:  Head and cervical spine CT scan 03/24/2017. FINDINGS: CT HEAD FINDINGS Brain: No evidence of acute infarction, hemorrhage, hydrocephalus, extra-axial collection or mass  lesion/mass effect. Remote right MCA territory infarct and extensive chronic microvascular ischemic change are again seen. Vascular: Atherosclerosis noted. Skull: Intact. No focal lesion. Remote fracture of the medial wall of the right orbit is incidentally noted and unchanged. Sinuses/Orbits: Negative. Other: None. CT CERVICAL SPINE FINDINGS Alignment: Reversal of lordosis is unchanged.  No listhesis. Skull base and vertebrae: No acute fracture. No primary bone lesion or focal pathologic process. The patient is status post C3-7 posterior fusion and laminectomy. Soft tissues and spinal canal: No prevertebral fluid or swelling. No visible canal hematoma. Disc levels: Degenerative disc disease most notable at C3-4, C5-6 and C6-7 is stable in appearance. Upper chest: Lung apices are clear.  Emphysema noted. Other: None. IMPRESSION: No acute abnormality head or cervical spine. Atrophy, chronic microvascular ischemic change and remote right MCA infarct. Status post C3-7 laminectomy and posterior fusion. Emphysema. Atherosclerosis. Electronically Signed   By: Inge Rise M.D.   On: 09/08/2017 08:46    ____________________________________________   PROCEDURES  Procedure(s) performed: None  Procedures  Critical Care performed: No ____________________________________________   INITIAL IMPRESSION / ASSESSMENT AND PLAN / ED COURSE  Pertinent labs & imaging results that were available during my care of the patient were reviewed by me and considered in my medical decision making (see chart for details).  62 y.o. male with a history of deafness and prior CVA with left-sided hemiplegia, remote cervical spine fusion, presenting with diffuse neck pain after a mechanical fall.  There is no history suggestive of a preceding syncopal episode or presyncope, or any associated cardiac or new neurologic complaints.  The patient is on Plavix, so we will plan to do CT of the head as well as C-spine for evaluation of  any injury.  The patient will be treated with Tylenol for his pain.  Plan reevaluation for final disposition.  ----------------------------------------- 9:01 AM on 09/08/2017 -----------------------------------------  The patient's imaging studies are reassuring.  He has no evidence of intracranial injury or spinal injury.  His fusion hardware is intact.  At this time, the patient is safe for discharge home.  Return precautions, follow-up instructions, and symptomatic treatment have been discussed.  ____________________________________________  FINAL CLINICAL IMPRESSION(S) / ED DIAGNOSES  Final diagnoses:  Injury of neck, initial encounter  Fall, initial encounter         NEW MEDICATIONS STARTED DURING THIS VISIT:  New Prescriptions   DIAZEPAM (VALIUM) 2 MG TABLET    Take 1 tablet (2 mg total) by mouth every 8 (eight) hours as needed for muscle spasms.  Eula Listen, MD 09/08/17 [phone]

## 2017-09-08 NOTE — ED Triage Notes (Signed)
Patient states while putting shoes on this morning, patient lost balance and patient hit back of head towel bar.  Patient c/o headache and neck pain.

## 2017-09-08 NOTE — ED Notes (Signed)
Breakfast tray given to patient.

## 2017-09-11 DIAGNOSIS — M545 Low back pain: Secondary | ICD-10-CM | POA: Diagnosis not present

## 2017-09-13 DIAGNOSIS — M545 Low back pain: Secondary | ICD-10-CM | POA: Diagnosis not present

## 2017-09-18 DIAGNOSIS — H524 Presbyopia: Secondary | ICD-10-CM | POA: Diagnosis not present

## 2017-09-18 DIAGNOSIS — M545 Low back pain: Secondary | ICD-10-CM | POA: Diagnosis not present

## 2017-09-21 DIAGNOSIS — M545 Low back pain: Secondary | ICD-10-CM | POA: Diagnosis not present

## 2017-09-26 DIAGNOSIS — M545 Low back pain: Secondary | ICD-10-CM | POA: Diagnosis not present

## 2017-09-29 ENCOUNTER — Encounter: Payer: Self-pay | Admitting: "Endocrinology

## 2017-10-11 DIAGNOSIS — M5416 Radiculopathy, lumbar region: Secondary | ICD-10-CM | POA: Diagnosis not present

## 2017-10-11 DIAGNOSIS — M545 Low back pain: Secondary | ICD-10-CM | POA: Diagnosis not present

## 2017-10-13 ENCOUNTER — Ambulatory Visit: Payer: Medicare Other | Admitting: "Endocrinology

## 2017-10-30 DIAGNOSIS — F332 Major depressive disorder, recurrent severe without psychotic features: Secondary | ICD-10-CM | POA: Diagnosis not present

## 2017-10-30 DIAGNOSIS — F341 Dysthymic disorder: Secondary | ICD-10-CM | POA: Diagnosis not present

## 2017-11-18 ENCOUNTER — Encounter: Payer: Self-pay | Admitting: Emergency Medicine

## 2017-11-18 ENCOUNTER — Emergency Department: Payer: Medicare Other

## 2017-11-18 ENCOUNTER — Emergency Department
Admission: EM | Admit: 2017-11-18 | Discharge: 2017-11-18 | Disposition: A | Discharge status: Skilled Nursing Facility | Payer: Medicare Other | Attending: Emergency Medicine | Admitting: Emergency Medicine

## 2017-11-18 ENCOUNTER — Other Ambulatory Visit: Payer: Self-pay

## 2017-11-18 DIAGNOSIS — W19XXXA Unspecified fall, initial encounter: Secondary | ICD-10-CM | POA: Insufficient documentation

## 2017-11-18 DIAGNOSIS — S79912A Unspecified injury of left hip, initial encounter: Secondary | ICD-10-CM | POA: Diagnosis not present

## 2017-11-18 DIAGNOSIS — R9431 Abnormal electrocardiogram [ECG] [EKG]: Secondary | ICD-10-CM | POA: Diagnosis not present

## 2017-11-18 DIAGNOSIS — Z87891 Personal history of nicotine dependence: Secondary | ICD-10-CM | POA: Diagnosis not present

## 2017-11-18 DIAGNOSIS — I10 Essential (primary) hypertension: Secondary | ICD-10-CM | POA: Insufficient documentation

## 2017-11-18 DIAGNOSIS — J449 Chronic obstructive pulmonary disease, unspecified: Secondary | ICD-10-CM | POA: Insufficient documentation

## 2017-11-18 DIAGNOSIS — Z7901 Long term (current) use of anticoagulants: Secondary | ICD-10-CM | POA: Diagnosis not present

## 2017-11-18 DIAGNOSIS — Z79899 Other long term (current) drug therapy: Secondary | ICD-10-CM | POA: Insufficient documentation

## 2017-11-18 DIAGNOSIS — M79662 Pain in left lower leg: Secondary | ICD-10-CM | POA: Diagnosis not present

## 2017-11-18 DIAGNOSIS — Y929 Unspecified place or not applicable: Secondary | ICD-10-CM | POA: Insufficient documentation

## 2017-11-18 DIAGNOSIS — M25552 Pain in left hip: Secondary | ICD-10-CM | POA: Diagnosis not present

## 2017-11-18 DIAGNOSIS — L03116 Cellulitis of left lower limb: Secondary | ICD-10-CM | POA: Diagnosis not present

## 2017-11-18 DIAGNOSIS — S8992XA Unspecified injury of left lower leg, initial encounter: Secondary | ICD-10-CM | POA: Diagnosis not present

## 2017-11-18 DIAGNOSIS — S7012XA Contusion of left thigh, initial encounter: Secondary | ICD-10-CM | POA: Diagnosis not present

## 2017-11-18 DIAGNOSIS — Y939 Activity, unspecified: Secondary | ICD-10-CM | POA: Insufficient documentation

## 2017-11-18 DIAGNOSIS — S0990XA Unspecified injury of head, initial encounter: Secondary | ICD-10-CM | POA: Insufficient documentation

## 2017-11-18 DIAGNOSIS — Y999 Unspecified external cause status: Secondary | ICD-10-CM | POA: Diagnosis not present

## 2017-11-18 DIAGNOSIS — J45909 Unspecified asthma, uncomplicated: Secondary | ICD-10-CM | POA: Diagnosis not present

## 2017-11-18 DIAGNOSIS — L0390 Cellulitis, unspecified: Secondary | ICD-10-CM

## 2017-11-18 DIAGNOSIS — S79922A Unspecified injury of left thigh, initial encounter: Secondary | ICD-10-CM | POA: Diagnosis present

## 2017-11-18 DIAGNOSIS — Z8673 Personal history of transient ischemic attack (TIA), and cerebral infarction without residual deficits: Secondary | ICD-10-CM | POA: Insufficient documentation

## 2017-11-18 DIAGNOSIS — E119 Type 2 diabetes mellitus without complications: Secondary | ICD-10-CM | POA: Insufficient documentation

## 2017-11-18 DIAGNOSIS — S4992XA Unspecified injury of left shoulder and upper arm, initial encounter: Secondary | ICD-10-CM | POA: Diagnosis not present

## 2017-11-18 DIAGNOSIS — M25512 Pain in left shoulder: Secondary | ICD-10-CM | POA: Diagnosis not present

## 2017-11-18 LAB — URINALYSIS, COMPLETE (UACMP) WITH MICROSCOPIC
BACTERIA UA: NONE SEEN
Bilirubin Urine: NEGATIVE
Glucose, UA: >=500 mg/dL — AB
Ketones, ur: NEGATIVE mg/dL
Leukocytes, UA: NEGATIVE
Nitrite: NEGATIVE
Protein, ur: NEGATIVE mg/dL
SPECIFIC GRAVITY, URINE: 1.013 (ref 1.005–1.030)
Squamous Epithelial / LPF: NONE SEEN (ref 0–5)
pH: 6 (ref 5.0–8.0)

## 2017-11-18 LAB — BASIC METABOLIC PANEL
ANION GAP: 8 (ref 5–15)
BUN: 10 mg/dL (ref 8–23)
CO2: 30 mmol/L (ref 22–32)
Calcium: 8.8 mg/dL — ABNORMAL LOW (ref 8.9–10.3)
Chloride: 102 mmol/L (ref 98–111)
Creatinine, Ser: 1.13 mg/dL (ref 0.61–1.24)
GFR calc Af Amer: >60 mL/min (ref >60)
GFR calc non Af Amer: >60 mL/min (ref >60)
GLUCOSE: 287 mg/dL — AB (ref 70–99)
POTASSIUM: 3.7 mmol/L (ref 3.5–5.1)
Sodium: 140 mmol/L (ref 135–145)

## 2017-11-18 LAB — CBC
HEMATOCRIT: 40.1 % (ref 40.0–52.0)
HEMOGLOBIN: 14.1 g/dL (ref 13.0–18.0)
MCH: 32.6 pg (ref 26.0–34.0)
MCHC: 35.1 g/dL (ref 32.0–36.0)
MCV: 92.9 fL (ref 80.0–100.0)
Platelets: 104 10*3/uL — ABNORMAL LOW (ref 150–440)
RBC: 4.32 MIL/uL — ABNORMAL LOW (ref 4.40–5.90)
RDW: 13.8 % (ref 11.5–14.5)
WBC: 3.9 10*3/uL (ref 3.8–10.6)

## 2017-11-18 MED ORDER — DOXYCYCLINE HYCLATE 100 MG PO CAPS: 100.0000 mg | ORAL_CAPSULE | Freq: Two times a day (BID) | ORAL | 0 refills | Status: AC

## 2017-11-18 MED ORDER — IBUPROFEN 600 MG PO TABS: 600.0000 mg | ORAL_TABLET | Freq: Three times a day (TID) | ORAL | 0 refills | Status: DC | PRN

## 2017-11-18 NOTE — ED Provider Notes (Signed)
[facility] Emergency Department Provider Note   ____________________________________________   I have reviewed the triage vital signs and the nursing notes.   HISTORY  Chief Complaint Fall   History limited by: Not Limited   HPI Charles [name] is a 62 y.o. male who presents to the emergency department today brought by family because of concerns for possible falling and injuries.  Family was visiting the patient today from [place].  They said they noticed that he was complaining of some left thigh pain and a bruise over his left thigh.  Also complaining of some left shoulder pain.  Is unclear exactly when the patient fell.  Is unclear why the patient fell.  He does state that he is still having some difficulty supporting weight on that leg.  Also with some complaints of urinary frequency however no fevers.    Per medical record review patient has a history of stroke, deaf.  Past Medical History:  Diagnosis Date  . Anemia   . Asthma   . Deaf   . Essential hypertension   . GERD (gastroesophageal reflux disease)   . History of migraine   . History of stroke    Spastic hemiplegia, right MCA stroke April 2014 in [place]  . Mixed hyperlipidemia   . Other pancytopenia ([place]) 12/23/2015  . PAD (peripheral artery disease) ([place])   . Sarcoidosis   . Seizures ([place])   . Stroke ([place])   . Type 2 diabetes mellitus [facility])     Patient Active Problem List   Diagnosis Date Noted  . Nonintractable headache 06/27/2017  . Mixed hyperlipidemia 05/17/2017  . Uncontrolled type 2 diabetes mellitus with hyperglycemia ([place]) 05/16/2017  . Thrombocytopenia ([place]) 09/13/2016  . Hypoglycemia   . Syncope 09/12/2016  . Seizure disorder as sequela of cerebrovascular accident ([place]) 09/12/2016  . MDD (major depressive disorder), recurrent episode, severe ([place]) 12/30/2015  . Suicidal ideation   . Other pancytopenia ([place]) 12/23/2015  . Loss of weight 11/12/2015  .  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 Iron deficiency anemia 09/22/2015  . Spinal stenosis in cervical region 08/21/2015  . Neck pain 08/12/2015  . Low back pain 08/12/2015  . Abnormality of gait 08/12/2015  . Neuropathy of right upper extremity 06/29/2015  . Acute rhinitis 05/05/2015  . Abdominal pain, epigastric 04/23/2015  . Arm pain, right 04/23/2015  . Atypical chest pain 07/23/2013  . 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 Dyspepsia 06/13/2013  . Dysphagia 06/13/2013  . Headache 11/30/2012  . Solitary pulmonary nodule 11/20/2012  . Spastic hemiplegia affecting left nondominant side ([place]) 11/08/2012  . Essential hypertension, benign 10/12/2012  . COPD GOLD II with reversibility  10/12/2012  . 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 CVA (cerebral vascular accident) ([place]) 10/03/2012  . Diabetes ([place]) 10/03/2012  . Sarcoidosis ([place]) 10/03/2012    Past Surgical History:  Procedure Laterality Date  . BRAVO PH STUDY N/A 06/18/2013   pH STUDY SHOWS NEXIUM TWICE DAILY CONTROLS THE ACID IN HIS STOMACH. HE HAD VERY FEWEPISODE OF REGURGITATION RECORDED IN THE 2 DAYS THE STUDY WAS PERFORMED.   [name] COLONOSCOPY  2011   [place]  . 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   . ESOPHAGOGASTRODUODENOSCOPY N/A 05/05/2015   Procedure: ESOPHAGOGASTRODUODENOSCOPY (EGD);  Surgeon:  Danie Binder, MD;  [facility];  Service: Endoscopy;  Laterality: N/A;  730  . MALONEY DILATION N/A 06/18/2013   Procedure: MALONEY DILATION;  Surgeon: Danie Binder, MD;  Location: [facility];  Service: Endoscopy;  Laterality: N/A;  . OTHER SURGICAL HISTORY     cyst removal head and chest  . POSTERIOR  CERVICAL FUSION/FORAMINOTOMY N/A 10/07/2015   Procedure: Cervical Three-Four, Cervical Four-Five, Cervical Five-Six, Cervical Six-Seven Posterior Cervical Laminectomy and Fusion;  Surgeon: Kary Kos, MD;  Location: [facility];  Service: Neurosurgery;  Laterality: N/A;  . SAVORY DILATION N/A 06/18/2013   Procedure: SAVORY DILATION;  Surgeon: Danie Binder, MD;  Location: [facility];  Service: Endoscopy;  Laterality: N/A;  . TEE WITHOUT CARDIOVERSION N/A 10/04/2012   Procedure: TRANSESOPHAGEAL ECHOCARDIOGRAM (TEE);  Surgeon: Thayer Headings, MD;  Location: [place];  Service: Cardiovascular;  Laterality: N/A;  . [place] accident  Both legs fractured and repaired surgically    Prior to Admission medications   Medication Sig Start Date End Date Taking? Authorizing Provider  albuterol (PROAIR HFA) 108 (90 Base) MCG/ACT inhaler Inhale 2 puffs into the lungs every 6 (six) hours as needed for wheezing or shortness of breath.    [provider]  alum & mag hydroxide-simeth (GERI-LANTA) 200-200-20 MG/5ML suspension Take 30 mLs by mouth every 4 (four) hours as needed for indigestion or heartburn.    [provider]  atorvastatin (LIPITOR) 10 MG tablet Take 10 mg by mouth daily. (cholesterol)    [provider]  cholecalciferol (VITAMIN D) 1000 units tablet Take 1,000 Units by mouth daily.    [provider]  Cholecalciferol (VITAMIN D3) 1000 units CAPS Take 1 capsule (1,000 Units total) by mouth daily. 08/30/17   Marcial Pacas, MD  clopidogrel (PLAVIX) 75 MG tablet Take 75 mg by mouth daily.    [provider]  diazepam (VALIUM) 2 MG tablet Take 1 tablet (2 mg total) by mouth every 8 (eight) hours as needed for muscle spasms. 09/08/17 09/08/18  Eula Listen, MD  escitalopram (LEXAPRO) 10 MG tablet Take 1 tablet (10 mg total) by mouth daily. 06/27/17   Marcial Pacas, MD  glucose blood (ONETOUCH VERIO) test strip 1 each by Other  route daily. And lancets 1/day 06/03/16   Renato Shin, MD  lamoTRIgine (LAMICTAL) 100 MG tablet Take 1 tablet (100 mg total) by mouth 2 (two) times daily. 08/30/17   Marcial Pacas, MD  losartan (COZAAR) 50 MG tablet Take 50 mg by mouth daily.    [provider]  tamsulosin (FLOMAX) 0.4 MG CAPS capsule Take 0.4 mg by mouth at bedtime.    [provider]  traMADol (ULTRAM) 50 MG tablet Take 1 tablet (50 mg total) by mouth every 6 (six) hours as needed. 03/04/17   Maczis, Barth Kirks, PA-C  traZODone (DESYREL) 50 MG tablet Take 50 mg by mouth at bedtime.    [provider]    Allergies Hydrocodone; Shellfish allergy; Metformin; Contrast media [iodinated diagnostic agents]; and Oxycodone  Family History  Problem Relation Age of Onset  . Diabetes Mother   . Hypertension Mother   . Diabetes Father   . Hypertension Father   . Colon polyps Neg Hx   . Colon cancer Neg Hx     Social History Social History   Tobacco Use  . Smoking status: Former Smoker    Packs/day: 1.00    Years: 29.00  Pack years: 29.00    Types: Cigarettes    Last attempt to quit: 02/22/1992    Years since quitting: 25.7  . Smokeless tobacco: Never Used  Substance Use Topics  . Alcohol use: Yes    Alcohol/week: 1.0 - 2.0 standard drinks    Types: 1 - 2 Cans of beer per week    Comment: occasionally, every 2-3 days  . Drug use: Yes    Types: Marijuana, "Crack" cocaine    Comment: hx of crack cocaine "long time ago" per pt - hard to tell how long it is has been since he used    Review of Systems Constitutional: No fever/chills Eyes: No visual changes. ENT: No sore throat. Cardiovascular: Denies chest pain. Respiratory: Denies shortness of breath. Gastrointestinal: No abdominal pain.  No nausea, no vomiting.  No diarrhea.   Genitourinary: Positive for frequent urination. Musculoskeletal: Positive for left thigh pain, positive for left shoulder pain. Skin: Positive for bruising to the left  thigh Neurological: Negative for headaches, focal weakness or numbness.  ____________________________________________   PHYSICAL EXAM:  VITAL SIGNS: ED Triage Vitals  Enc Vitals Group     BP 11/18/17 1351 (!) 173/95     Pulse Rate 11/18/17 1351 88     Resp 11/18/17 1351 18     Temp 11/18/17 1351 98.6 F (37 C)     Temp Source 11/18/17 1351 Oral     SpO2 11/18/17 1351 98 %     Weight 11/18/17 1352 166 lb 0.1 oz (75.3 kg)     Height 11/18/17 1352 5\' 6"  (1.676 m)     Head Circumference --      Peak Flow --      Pain Score 11/18/17 1351 9   Constitutional: Awake and alert.  Eyes: Conjunctivae are normal.  ENT      Head: Normocephalic and atraumatic.      Nose: No congestion/rhinnorhea.      Mouth/Throat: Mucous membranes are moist.      Neck: No stridor. Hematological/Lymphatic/Immunilogical: No cervical lymphadenopathy. Cardiovascular: Normal rate, regular rhythm.  No murmurs, rubs, or gallops.  Respiratory: Normal respiratory effort without tachypnea nor retractions. Breath sounds are clear and equal bilaterally. No wheezes/rales/rhonchi. Gastrointestinal: Soft and non tender. No rebound. No guarding.  Genitourinary: Deferred Musculoskeletal: Some tenderness to palpation of the left scapula. Left lower extremity edema and erythema.  Neurologic:  Normal speech and language. No gross focal neurologic deficits are appreciated.  Skin:  Bruising noted to left thigh and left lower back. Erythema noted to left lower leg.  Psychiatric: Mood and affect are normal. Speech and behavior are normal. Patient exhibits appropriate insight and judgment.  ____________________________________________    LABS (pertinent positives/negatives)  CBC wbc 3.9, hgb 14.1, plt 104 BMP na 140, k 3.7, glu 287, cr 1.13 UA >500 small hgb dipstick, 0-5 rbc and wbc  ____________________________________________   EKG  I, Nance Pear, attending physician, personally viewed and interpreted this  EKG  EKG Time: 1404 Rate: 80 Rhythm: normal sinus rhythm Axis: normal Intervals: qtc 454 QRS: narrow, q waves v1, LVH ST changes: no st elevation Impression: abnormal ekg  ____________________________________________    RADIOLOGY  Left hip No acute osseous injury  CT head No acute intracranial process  Left scapula No acute injury  [place] left lower extremity No DVT ____________________________________________   PROCEDURES  Procedures  ____________________________________________   INITIAL IMPRESSION / ASSESSMENT AND PLAN / ED COURSE  Pertinent labs & imaging results that were available during my  care of the patient were reviewed by me and considered in my medical decision making (see chart for details).  Patient presented to the emergency department today because of concerns for possible injuries after a fall.  X-rays did not show any acute osseous injury.  He did have some swelling and redness to his left leg.  Ultrasound did not show any blood clot.  Do have some concerns for cellulitis given physical exam.  Will plan on initiating antibiotic.  Discussed with patient and family.  ____________________________________________   FINAL CLINICAL IMPRESSION(S) / ED DIAGNOSES  Final diagnoses:  Cellulitis, unspecified cellulitis site  Contusion of left thigh, initial encounter     Note: This dictation was prepared with Dragon dictation. Any transcriptional errors that result from this process are unintentional     Nance Pear, MD 11/19/17 1504

## 2017-11-18 NOTE — ED Notes (Signed)
Report given to [facility]. poa gary in the room with pt.

## 2017-11-18 NOTE — Discharge Instructions (Addendum)
Please seek medical attention for any high fevers, chest pain, shortness of breath, change in behavior, persistent vomiting, bloody stool or any other new or concerning symptoms.  

## 2017-11-18 NOTE — ED Notes (Signed)
Blue top sent also with temp label as pt on plavix

## 2017-11-18 NOTE — ED Triage Notes (Signed)
Pt presents with sister from [facility], where he lives, following a stroke in 2015. Family states that he has large bruise on his left hip and on his back. Sister also states that he has increased edema as well. Pt alert & oriented; has residual speech difficulty due to stroke. He says that he hit his head as well. NAD noted.

## 2017-11-18 NOTE — ED Notes (Signed)
Pt to ct and xr

## 2017-11-21 DIAGNOSIS — F99 Mental disorder, not otherwise specified: Secondary | ICD-10-CM | POA: Diagnosis not present

## 2017-11-29 ENCOUNTER — Encounter: Payer: Self-pay | Admitting: Neurology

## 2017-11-29 ENCOUNTER — Ambulatory Visit (INDEPENDENT_AMBULATORY_CARE_PROVIDER_SITE_OTHER): Payer: Medicare Other | Admitting: Neurology

## 2017-11-29 VITALS — BP 154/87 | HR 72

## 2017-11-29 DIAGNOSIS — G8114 Spastic hemiplegia affecting left nondominant side: Secondary | ICD-10-CM | POA: Diagnosis not present

## 2017-11-29 MED ORDER — INCOBOTULINUMTOXINA 100 UNITS IM SOLR
500.0000 [IU] | INTRAMUSCULAR | Status: DC
  Administered 2017-11-29: 500 [IU] via INTRAMUSCULAR

## 2017-11-29 NOTE — Progress Notes (Signed)
**  [name] 100 units x 5 vials, NDC 4825-0037-04, Lot [id number], Exp 10/2019, office supply.//mck,rn**

## 2017-11-29 NOTE — Patient Instructions (Addendum)
Performed electrical stimulation guided botulism toxin injection today for his spastic left upper extremity.  He will return to clinic in 3 months for repeat injection,  He was noted to have significant left anterior shin area swelling, pitting edema, painful upon deep palpitation, please keep close observation, continue follow-up with his facility physician/primary care physician,

## 2017-11-29 NOTE — Progress Notes (Signed)
Chief Complaint  Patient presents with  . Left Spastic Hemiplegia    [name] 100 units x 5 vials - office supply      PATIENT: Charles [name] DOB: 1955/10/03  HISTORICAL  AKSHITH MONCUS is a 62 years old right-handed male, accompanied by his mother, and his brother, interpreter, for EMG guided Botox injection for his spastic left hemiparesis,I saw him first in Nov 2015, he was previously patients of Dr. Leonie Man, and Dr. Janann Colonel, who has performed EMG guided Botox injection in January, April, July 2015, for his spastic left hemiparesis, which he responded very well  He was born deaf, He had a history of pulmonary sarcoidosis, was treated with long-term low-dose steroid, also with past medical history of hypertension, diabetes, hyperlipidemia,  He suffered stroke in April 2014, was treated at [place], with residual severe left hemiparesis, per record  MRI of the brain showed remote right hemispheric infarct and small vessel disease type changes  MRA of brain abrupt  cutoff of flow at the right carotid terminus with nonvisualization of right middle cerebral artery branch vessels consistent with the patient's remote right hemispheric infarct. Fetal type origin of the posterior cerebral arteries with posterior cerebral artery supplied predominately from the anterior circulation. 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   2D Echocardiogram 60%, wall motion normal, LA normal size Carotid Doppler Bilateral: 1-39% ICA stenosis. Vertebral artery flow is antegrade  TEE no PFO, Pulm AVM  EEG in August 2014, normal  awake and asleep EEG. No focal or generalized epileptiform discharges noted   UPDATE May 8th 2017: He is with his mother and brother at today's clinical visit, last visit was November 25th 2015 for EMG guided Botox injection to his spastic left upper and lower extremity, he denies significant improvement with Botox injection.  Today he came in with a new  issue, complains a year history of intermittent right elbow discomfort, radiating pain to right arm, right hand, mainly involving right fifths, and fourth fingers, it happened with prolonged sitting, eating, or sleeping, he also noticed mild right hand weakness, difficulty to unscrew the bottle top. 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 He complains progressive worsening gait abnormality, falling few times, neck pain, low back pain, radiating pain to his right leg  UPDATE August 24 2015: He is accompanied by his mother brother and interpreter at today's clinical visit, he continue complains of low back pain radiating pain to right lower extremity, intermittent right arm hands paresthesia, worsening gait difficulty, urinary urgency,  We have personally reviewed MRI of cervical spine in June 2017: There is severe spinal stenosis at C3-C4 and at C4-C5 due to central disc protrusions/herniations, uncovertebral spurring, facet hypertrophy and congenitally short pedicles. There is subtle hyperintense signal within the spinal cord on sagittal STIR images just below the C3-C4 interspace but this is not confirmed on axial images. This could represent a mild cervical myelopathy. 2. There is moderate spinal stenosis at C5-C6 and C6-C7 mild spinal stenosis at C2-C3 to degenerative changes and congenitally short pedicles. 3. At C3-C4 there is moderately severe bilateral foraminal narrowing at could lead to impingement either of the C4 nerve roots. There does not appear to be nerve root  impingement at the other cervical levels  MRI of lumbar spine in June 2017: At L4-L5, there is broad disc bulging, moderately severe facet  hypertrophy, right greater than left, and minimal anterolisthesis. There is no nerve root compression at this level. When compared to the MRI dated 12/24/2012, the synovial cyst that was noted to the left on the prior MRI is no longer present. This relieves the left L5 nerve root impingement that was noted at that time. However, since the prior MRI there has been mild progression of the facet hypertrophy and minimal anterolisthesis . 2. There are milder degenerative changes at L3-L4 and L5-S1 that did not lead to any nerve root impingement, unchanged when compared to the previous MRI.  UPDATE Dec 03 2015: He had cervical decompression laminectomy C3-4-5 6 with foraminotomies of the C4-5 VI nerve roots by Dr. Saintclair Halsted on October 07 2015, patient reported significant improvement in his neck pain, family noticed he has difficulty sleeping, agitated.  I personally reviewed MRI cervical spine in September 2017: Posterior hardware fusion C3 through C7. Posterior decompression C3 through C5. Small posterior fluid collection in the soft tissues may represent postop fluid versus CSF leak. Small area of cord hyperintensity at the C4 level is unchanged compatible with chronic myelomalacia. No cord compression.  He complains of low back pain,  MRI of left femur there was no significant abnormality notice, MRI lumbar in June 2017, multilevel degenerative changes, but there was no significant canal or foraminal stenosis   UPDATE Jan 16th 2018: He complains of neck pain, using heating pad, stiff, limited range of motion, lean back to get relief, difficulty to get a comfortable position to sleep, he lost drip of his right hand,  Left leg pain when bearing weight, left leg tightness,   CAT scan of the brain in August 2017 showed large right MCA stroke  Update Jul 05 2016: He complains of worsening left-sided low back pain, unsteady gait, we have personally reviewed MRI of lumbar in June 2017, only mild degenerative  changes, there is no significant canal or foraminal stenosis.  He is taking Mobic/naproxen as needed, worsening spastic left hemiparesis, left hands in position   UPDATE June 5th 2018: He returned for a electronic stimulation guided xeomin injection for spastic left hemiparesis, was emphasize on left upper extremity today,  UPDATE Feb 08 2017: He did respond some to previous xeomin injection 500 units, still has significant left arm spasticity, weakness, left anterior thigh muscle spasm in the achy pain  UPDATE Jun 27 2017: He presented to the emergency room on April 22, 2017, complains of the headaches, apparently he was given cocktail of Compazine, Benadryl, Decadron,  CT head without contrast showed chronic right MCA stroke, no acute abnormality  Laboratory evaluation showed normal TSH, free T4, lipid panel, A1c was mildly elevated 6.0, MRI of left hip on April 26, 2017 showed mild marginal osteophytes on the femoral head, stable since 2017,  He was given right greater occipital nerve perineural steroid injection on June 19, 2017 by Dr. Rolla Flatten, his headache overall has much improved,  UPDATE August 30 2017: He now lives at nursing home, there is no recurrent seizure, he is not taking lower dose of lamotrigine 100 mg daily, responding well to previous EMG guided xeomin injection.  UPDATE Nov 29 2017: He was not sure about the benefit from previous injection, no recurrent seizure, was noted to have left anterior shin area swelling,  REVIEW OF SYSTEMS: Full 14 system review of  systems performed and notable only for as above ALLERGIES: Allergies  Allergen Reactions  . Hydrocodone Other (See Comments)    Makes "looney"  . Shellfish Allergy Hives and Swelling  . Metformin Diarrhea  . Contrast Media [Iodinated Diagnostic Agents] Itching  . Oxycodone Nausea And Vomiting    HOME MEDICATIONS: Current Outpatient Medications on File Prior to Visit  Medication Sig Dispense Refill  .  albuterol (PROAIR HFA) 108 (90 Base) MCG/ACT inhaler Inhale 2 puffs into the lungs every 4 (four) hours as needed for wheezing or shortness of breath.     [name] alum & mag hydroxide-simeth (GERI-LANTA) 200-200-20 MG/5ML suspension Take 30 mLs by mouth every 4 (four) hours as needed for indigestion or heartburn.    [name] atorvastatin (LIPITOR) 10 MG tablet Take 10 mg by mouth daily. (cholesterol)    . cholecalciferol (VITAMIN D) 1000 units tablet Take 1,000 Units by mouth daily.    . Cholecalciferol (VITAMIN D3) 1000 units CAPS Take 1 capsule (1,000 Units total) by mouth daily. 90 capsule 4  . clopidogrel (PLAVIX) 75 MG tablet Take 75 mg by mouth daily.    . diazepam (VALIUM) 2 MG tablet Take 1 tablet (2 mg total) by mouth every 8 (eight) hours as needed for muscle spasms. 10 tablet 0  . diclofenac sodium (VOLTAREN) 1 % GEL Apply 1 g topically 3 (three) times daily as needed (pain).    [name] escitalopram (LEXAPRO) 10 MG tablet Take 1 tablet (10 mg total) by mouth daily. (Patient taking differently: Take 20 mg by mouth daily. ) 30 tablet 11  . gabapentin (NEURONTIN) 300 MG capsule Take 300 mg by mouth 3 (three) times daily.    [name] glucose blood (ONETOUCH VERIO) test strip 1 each by Other route daily. And lancets 1/day 100 each 3  . ibuprofen (ADVIL,MOTRIN) 600 MG tablet Take 1 tablet (600 mg total) by mouth every 8 (eight) hours as needed. 20 tablet 0  . IncobotulinumtoxinA ([name] IM) Inject 500 Units into the muscle every 3 (three) months.    . lamoTRIgine (LAMICTAL) 100 MG tablet Take 1 tablet (100 mg total) by mouth 2 (two) times daily. 60 tablet 11  . losartan (COZAAR) 25 MG tablet Take 25 mg by mouth daily.     . tamsulosin (FLOMAX) 0.4 MG CAPS capsule Take 0.4 mg by mouth at bedtime.    . traMADol (ULTRAM) 50 MG tablet Take 1 tablet (50 mg total) by mouth every 6 (six) hours as needed. 15 tablet 0  . traZODone (DESYREL) 50 MG tablet Take 50 mg by mouth at bedtime.     No current facility-administered  medications on file prior to visit.     PAST MEDICAL HISTORY: Past Medical History:  Diagnosis Date  . Anemia   . Asthma   . Deaf   . Essential hypertension   . GERD (gastroesophageal reflux disease)   . History of migraine   . History of stroke    Spastic hemiplegia, right MCA stroke April 2014 in [place]  . Mixed hyperlipidemia   . Other pancytopenia ([place]) 12/23/2015  . PAD (peripheral artery disease) ([place])   . Sarcoidosis   . Seizures ([place])   . Stroke ([place])   . Type 2 diabetes mellitus ([place])     PAST SURGICAL HISTORY: Past Surgical History:  Procedure Laterality Date  . BRAVO PH STUDY N/A 06/18/2013   pH STUDY SHOWS NEXIUM TWICE DAILY CONTROLS THE ACID IN HIS STOMACH. HE HAD VERY FEWEPISODE OF REGURGITATION RECORDED IN THE  2 DAYS THE STUDY WAS PERFORMED.   [name] COLONOSCOPY  2011   IN [place]  . 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   . ESOPHAGOGASTRODUODENOSCOPY N/A 05/05/2015   Procedure: ESOPHAGOGASTRODUODENOSCOPY (EGD);  Surgeon: Danie Binder, MD;  Location: [facility];  Service: Endoscopy;  Laterality: N/A;  730  . MALONEY DILATION N/A 06/18/2013   Procedure: MALONEY DILATION;  Surgeon: Danie Binder, MD;  Location: [facility];  Service: Endoscopy;  Laterality: N/A;  . OTHER SURGICAL HISTORY     cyst removal head and chest  . POSTERIOR CERVICAL FUSION/FORAMINOTOMY N/A 10/07/2015   Procedure: Cervical Three-Four, Cervical Four-Five, Cervical Five-Six, Cervical Six-Seven Posterior Cervical Laminectomy and Fusion;  Surgeon: Kary Kos, MD;  Location: [facility];  Service: Neurosurgery;  Laterality: N/A;  . SAVORY DILATION N/A 06/18/2013   Procedure: SAVORY DILATION;   Surgeon: Danie Binder, MD;  Location: [facility];  Service: Endoscopy;  Laterality: N/A;  . TEE WITHOUT CARDIOVERSION N/A 10/04/2012   Procedure: TRANSESOPHAGEAL ECHOCARDIOGRAM (TEE);  Surgeon: Thayer Headings, MD;  Location: [facility] ENDOSCOPY;  Service: Cardiovascular;  Laterality: N/A;  . [place] accident  Both legs fractured and repaired surgically    FAMILY HISTORY: Family History  Problem Relation Age of Onset  . Diabetes Mother   . Hypertension Mother   . Diabetes Father   . Hypertension Father   . Colon polyps Neg Hx   . Colon cancer Neg Hx     SOCIAL HISTORY:  Social History   Socioeconomic History  . Marital status: Single    Spouse name: Not on file  . Number of children: 0  . Years of education: HS  . Highest education level: Not on file  Occupational History  . Occupation: Disabled  Social Needs  . Financial resource strain: Not on file  . Food insecurity:    Worry: Not on file    Inability: Not on file  . Transportation needs:    Medical: Not on file    Non-medical: Not on file  Tobacco Use  . Smoking status: Former Smoker    Packs/day: 1.00    Years: 29.00    Pack years: 29.00    Types: Cigarettes    Last attempt to quit: 02/22/1992    Years since quitting: 25.7  . Smokeless tobacco: Never Used  Substance and Sexual Activity  . Alcohol use: Yes    Alcohol/week: 1.0 - 2.0 standard drinks    Types: 1 - 2 Cans of beer per week    Comment: occasionally, every 2-3 days  . Drug use: Yes    Types: Marijuana, "Crack" cocaine    Comment: hx of crack cocaine "long time ago" per pt - hard to tell how long it is has been since he used  . Sexual activity: Never  Lifestyle  . Physical activity:    Days per week: Not on file    Minutes per session: Not on file  . Stress: Not on file  Relationships  . Social connections:    Talks on phone: Not on file    Gets together: Not on file    Attends religious service: Not on file     Active member of  club or organization: Not on file    Attends meetings of clubs or organizations: Not on file    Relationship status: Not on file  . Intimate partner violence:    Fear of current or ex partner: Not on file    Emotionally abused: Not on file    Physically abused: Not on file    Forced sexual activity: Not on file  Other Topics Concern  . Not on file  Social History Narrative   Patient lives at home with his family. Brother, sister, mother    Patient does not work.    Patient has a high school education.    Patient has one step child      PHYSICAL EXAM   Vitals:   11/29/17 1426  BP: (!) 154/87  Pulse: 72    Not recorded      There is no height or weight on file to calculate BMI.   PHYSICAL EXAMNIATION:  Gen: NAD, conversant, well nourised, obese, well groomed                     Cardiovascular: Regular rate rhythm, no peripheral edema, warm, nontender. Eyes: Conjunctivae clear without exudates or hemorrhage Neck: Supple, no carotid bruise. Pulmonary: Clear to auscultation bilaterally   NEUROLOGICAL EXAM:  MENTAL STATUS: Speech: He is mute depend on interpreter for history,   CRANIAL NERVES: CN II: Visual fields are full to confrontation. Fundoscopic exam is normal with sharp discs and no vascular changes. Pupils are round equal and briskly reactive to light. CN III, IV, VI: extraocular movement are normal. No ptosis. CN V: Facial sensation is intact to pinprick in all 3 divisions bilaterally. Corneal responses are intact.  CN VII: Face is symmetric with normal eye closure and smile. CN VIII: deaf CN IX, X: Palate elevates symmetrically. Phonation is normal. CN XI: Head turning and shoulder shrug are intact CN XII: Tongue is midline with normal movements and no atrophy.  MOTOR: Spastic left hemiparesis, wearing left ankle brace, mild left shoulder antigravity movement left shoulder abduction with passive movement maximum 90, left elbow 170,  complains of left elbow pain with passive movement, left thumb in position, left hip flexion 3, left knee extension 4, left knee flexion 4, he also has mild right ankle dorsiflexion weakness.  He has right elbow Tinel signs, mild right finger abduction weakness, mild right hand grip weakness  REFLEXES:  Hyperreflexia of both side, worse on the left side, bilateral Babinski sign.  SENSORY: Intact to light touch, pinprick, positional and vibratory sensation are intact in fingers and toes, with exception of decreased light touch in the right fifth, and half of right fourth finger extending to right ulnar palmar, dorsum of right hand  COORDINATION: There is no dysmetria on right finger-to-nose and heel-knee-shin.    GAIT/STANCE: Rely on his 4 foot cane, multiple tends to get up from seated position, left hemi-circumferential, unsteady gait      Lab Results  Component Value Date   WBC 3.9 11/18/2017   HGB 14.1 11/18/2017   HCT 40.1 11/18/2017   MCV 92.9 11/18/2017   PLT 104 (L) 11/18/2017      Component Value Date/Time   NA 140 11/18/2017 1402   K 3.7 11/18/2017 1402   CL 102 11/18/2017 1402   CO2 30 11/18/2017 1402   GLUCOSE 287 (H) 11/18/2017 1402   BUN 10 11/18/2017 1402   CREATININE 1.13 11/18/2017 1402   CALCIUM 8.8 (L) 11/18/2017 1402   PROT  7.2 09/12/2016 1428   ALBUMIN 4.2 09/12/2016 1428   AST 27 09/12/2016 1428   ALT 25 09/12/2016 1428   ALKPHOS 60 09/12/2016 1428   BILITOT 0.6 09/12/2016 1428   GFRNONAA >60 11/18/2017 1402   GFRAA >60 11/18/2017 1402   Lab Results  Component Value Date   CHOL 160 09/13/2016   HDL 59 09/13/2016   LDLCALC 43 09/13/2016   TRIG 288 (H) 09/13/2016   CHOLHDL 2.7 09/13/2016   Lab Results  Component Value Date   HGBA1C 6.0 (H) 06/13/2017   Lab Results  Component Value Date   VITAMINB12 405 09/12/2016   Lab Results  Component Value Date   TSH 1.83 06/13/2017      ASSESSMENT AND PLAN  TINO RONAN is a 62 y.o. male   Congenital deaf, use sign language, history is through interpreter   Cervical spondylitic myelopathy  Status post cervical decompression surgery in August 2017, Continued to complains of neck pain  I have encouraged him neck stretching exercise, hot compression History of seizure,  Difficulty tolerating Keppra, keep lamotrigine 100 mg twice a day   Low back pain, left anterior thigh and hip pain, gait abnormality History of stroke, with residual spastic left hemiparesis   Continue electrical stimulation guided xeomin injection, used 500 units  Left pectoralis major 100 units Left brachialis 100 units Left pronator teres 50 units Left flexor digitorum profundi 50 units Left flexor digitorum superficialis 50 units  Left palmaris longus 50 units Left flexor carpi ulnaris 50 units Left flexor carpi radialis 50 units   Marcial Pacas, M.D. Ph.D.  [facility] [address] [place], [place] [address] [phone]

## 2017-11-30 DIAGNOSIS — M5416 Radiculopathy, lumbar region: Secondary | ICD-10-CM | POA: Diagnosis not present

## 2017-11-30 DIAGNOSIS — M545 Low back pain: Secondary | ICD-10-CM | POA: Diagnosis not present

## 2017-12-01 DIAGNOSIS — F99 Mental disorder, not otherwise specified: Secondary | ICD-10-CM | POA: Diagnosis not present

## 2017-12-04 DIAGNOSIS — H9190 Unspecified hearing loss, unspecified ear: Secondary | ICD-10-CM | POA: Diagnosis present

## 2017-12-04 DIAGNOSIS — E1165 Type 2 diabetes mellitus with hyperglycemia: Secondary | ICD-10-CM | POA: Diagnosis not present

## 2017-12-04 DIAGNOSIS — E1151 Type 2 diabetes mellitus with diabetic peripheral angiopathy without gangrene: Secondary | ICD-10-CM | POA: Diagnosis present

## 2017-12-04 DIAGNOSIS — E11628 Type 2 diabetes mellitus with other skin complications: Secondary | ICD-10-CM | POA: Diagnosis present

## 2017-12-04 DIAGNOSIS — Z91041 Radiographic dye allergy status: Secondary | ICD-10-CM | POA: Diagnosis not present

## 2017-12-04 DIAGNOSIS — J45909 Unspecified asthma, uncomplicated: Secondary | ICD-10-CM | POA: Diagnosis present

## 2017-12-04 DIAGNOSIS — Z7984 Long term (current) use of oral hypoglycemic drugs: Secondary | ICD-10-CM | POA: Diagnosis not present

## 2017-12-04 DIAGNOSIS — Z888 Allergy status to other drugs, medicaments and biological substances status: Secondary | ICD-10-CM | POA: Diagnosis not present

## 2017-12-04 DIAGNOSIS — Z6828 Body mass index (BMI) 28.0-28.9, adult: Secondary | ICD-10-CM | POA: Diagnosis not present

## 2017-12-04 DIAGNOSIS — Z23 Encounter for immunization: Secondary | ICD-10-CM | POA: Diagnosis not present

## 2017-12-04 DIAGNOSIS — Z79899 Other long term (current) drug therapy: Secondary | ICD-10-CM | POA: Diagnosis not present

## 2017-12-04 DIAGNOSIS — Z7902 Long term (current) use of antithrombotics/antiplatelets: Secondary | ICD-10-CM | POA: Diagnosis not present

## 2017-12-04 DIAGNOSIS — Z299 Encounter for prophylactic measures, unspecified: Secondary | ICD-10-CM | POA: Diagnosis not present

## 2017-12-04 DIAGNOSIS — L03116 Cellulitis of left lower limb: Secondary | ICD-10-CM | POA: Diagnosis not present

## 2017-12-04 DIAGNOSIS — I69359 Hemiplegia and hemiparesis following cerebral infarction affecting unspecified side: Secondary | ICD-10-CM | POA: Diagnosis not present

## 2017-12-04 DIAGNOSIS — R609 Edema, unspecified: Secondary | ICD-10-CM | POA: Diagnosis not present

## 2017-12-04 DIAGNOSIS — I10 Essential (primary) hypertension: Secondary | ICD-10-CM | POA: Diagnosis not present

## 2017-12-07 DIAGNOSIS — M5416 Radiculopathy, lumbar region: Secondary | ICD-10-CM | POA: Diagnosis not present

## 2017-12-08 DIAGNOSIS — L03116 Cellulitis of left lower limb: Secondary | ICD-10-CM | POA: Diagnosis not present

## 2017-12-08 DIAGNOSIS — E1151 Type 2 diabetes mellitus with diabetic peripheral angiopathy without gangrene: Secondary | ICD-10-CM | POA: Diagnosis not present

## 2017-12-08 DIAGNOSIS — I69354 Hemiplegia and hemiparesis following cerebral infarction affecting left non-dominant side: Secondary | ICD-10-CM | POA: Diagnosis not present

## 2017-12-08 DIAGNOSIS — H913 Deaf nonspeaking, not elsewhere classified: Secondary | ICD-10-CM | POA: Diagnosis not present

## 2017-12-08 DIAGNOSIS — G5621 Lesion of ulnar nerve, right upper limb: Secondary | ICD-10-CM | POA: Diagnosis not present

## 2017-12-08 DIAGNOSIS — D860 Sarcoidosis of lung: Secondary | ICD-10-CM | POA: Diagnosis not present

## 2017-12-11 DIAGNOSIS — D860 Sarcoidosis of lung: Secondary | ICD-10-CM | POA: Diagnosis not present

## 2017-12-11 DIAGNOSIS — G5621 Lesion of ulnar nerve, right upper limb: Secondary | ICD-10-CM | POA: Diagnosis not present

## 2017-12-11 DIAGNOSIS — L03116 Cellulitis of left lower limb: Secondary | ICD-10-CM | POA: Diagnosis not present

## 2017-12-11 DIAGNOSIS — I69354 Hemiplegia and hemiparesis following cerebral infarction affecting left non-dominant side: Secondary | ICD-10-CM | POA: Diagnosis not present

## 2017-12-11 DIAGNOSIS — E1151 Type 2 diabetes mellitus with diabetic peripheral angiopathy without gangrene: Secondary | ICD-10-CM | POA: Diagnosis not present

## 2017-12-11 DIAGNOSIS — H913 Deaf nonspeaking, not elsewhere classified: Secondary | ICD-10-CM | POA: Diagnosis not present

## 2017-12-14 DIAGNOSIS — G5621 Lesion of ulnar nerve, right upper limb: Secondary | ICD-10-CM | POA: Diagnosis not present

## 2017-12-14 DIAGNOSIS — Z6828 Body mass index (BMI) 28.0-28.9, adult: Secondary | ICD-10-CM | POA: Diagnosis not present

## 2017-12-14 DIAGNOSIS — H913 Deaf nonspeaking, not elsewhere classified: Secondary | ICD-10-CM | POA: Diagnosis not present

## 2017-12-14 DIAGNOSIS — L0390 Cellulitis, unspecified: Secondary | ICD-10-CM | POA: Diagnosis not present

## 2017-12-14 DIAGNOSIS — E1165 Type 2 diabetes mellitus with hyperglycemia: Secondary | ICD-10-CM | POA: Diagnosis not present

## 2017-12-14 DIAGNOSIS — L03116 Cellulitis of left lower limb: Secondary | ICD-10-CM | POA: Diagnosis not present

## 2017-12-14 DIAGNOSIS — I69354 Hemiplegia and hemiparesis following cerebral infarction affecting left non-dominant side: Secondary | ICD-10-CM | POA: Diagnosis not present

## 2017-12-14 DIAGNOSIS — I739 Peripheral vascular disease, unspecified: Secondary | ICD-10-CM | POA: Diagnosis not present

## 2017-12-14 DIAGNOSIS — Z299 Encounter for prophylactic measures, unspecified: Secondary | ICD-10-CM | POA: Diagnosis not present

## 2017-12-14 DIAGNOSIS — D860 Sarcoidosis of lung: Secondary | ICD-10-CM | POA: Diagnosis not present

## 2017-12-14 DIAGNOSIS — E1151 Type 2 diabetes mellitus with diabetic peripheral angiopathy without gangrene: Secondary | ICD-10-CM | POA: Diagnosis not present

## 2017-12-19 DIAGNOSIS — D860 Sarcoidosis of lung: Secondary | ICD-10-CM | POA: Diagnosis not present

## 2017-12-19 DIAGNOSIS — I69354 Hemiplegia and hemiparesis following cerebral infarction affecting left non-dominant side: Secondary | ICD-10-CM | POA: Diagnosis not present

## 2017-12-19 DIAGNOSIS — G5621 Lesion of ulnar nerve, right upper limb: Secondary | ICD-10-CM | POA: Diagnosis not present

## 2017-12-19 DIAGNOSIS — H913 Deaf nonspeaking, not elsewhere classified: Secondary | ICD-10-CM | POA: Diagnosis not present

## 2017-12-19 DIAGNOSIS — L03116 Cellulitis of left lower limb: Secondary | ICD-10-CM | POA: Diagnosis not present

## 2017-12-19 DIAGNOSIS — E1151 Type 2 diabetes mellitus with diabetic peripheral angiopathy without gangrene: Secondary | ICD-10-CM | POA: Diagnosis not present

## 2017-12-21 DIAGNOSIS — G5621 Lesion of ulnar nerve, right upper limb: Secondary | ICD-10-CM | POA: Diagnosis not present

## 2017-12-21 DIAGNOSIS — L03116 Cellulitis of left lower limb: Secondary | ICD-10-CM | POA: Diagnosis not present

## 2017-12-21 DIAGNOSIS — D860 Sarcoidosis of lung: Secondary | ICD-10-CM | POA: Diagnosis not present

## 2017-12-21 DIAGNOSIS — H913 Deaf nonspeaking, not elsewhere classified: Secondary | ICD-10-CM | POA: Diagnosis not present

## 2017-12-21 DIAGNOSIS — I69354 Hemiplegia and hemiparesis following cerebral infarction affecting left non-dominant side: Secondary | ICD-10-CM | POA: Diagnosis not present

## 2017-12-21 DIAGNOSIS — F99 Mental disorder, not otherwise specified: Secondary | ICD-10-CM | POA: Diagnosis not present

## 2017-12-21 DIAGNOSIS — E1151 Type 2 diabetes mellitus with diabetic peripheral angiopathy without gangrene: Secondary | ICD-10-CM | POA: Diagnosis not present

## 2017-12-22 DIAGNOSIS — H913 Deaf nonspeaking, not elsewhere classified: Secondary | ICD-10-CM | POA: Diagnosis not present

## 2017-12-22 DIAGNOSIS — L03116 Cellulitis of left lower limb: Secondary | ICD-10-CM | POA: Diagnosis not present

## 2017-12-22 DIAGNOSIS — G5621 Lesion of ulnar nerve, right upper limb: Secondary | ICD-10-CM | POA: Diagnosis not present

## 2017-12-22 DIAGNOSIS — E1151 Type 2 diabetes mellitus with diabetic peripheral angiopathy without gangrene: Secondary | ICD-10-CM | POA: Diagnosis not present

## 2017-12-22 DIAGNOSIS — D860 Sarcoidosis of lung: Secondary | ICD-10-CM | POA: Diagnosis not present

## 2017-12-22 DIAGNOSIS — I69354 Hemiplegia and hemiparesis following cerebral infarction affecting left non-dominant side: Secondary | ICD-10-CM | POA: Diagnosis not present

## 2017-12-25 DIAGNOSIS — D860 Sarcoidosis of lung: Secondary | ICD-10-CM | POA: Diagnosis not present

## 2017-12-25 DIAGNOSIS — E1151 Type 2 diabetes mellitus with diabetic peripheral angiopathy without gangrene: Secondary | ICD-10-CM | POA: Diagnosis not present

## 2017-12-25 DIAGNOSIS — H913 Deaf nonspeaking, not elsewhere classified: Secondary | ICD-10-CM | POA: Diagnosis not present

## 2017-12-25 DIAGNOSIS — G5621 Lesion of ulnar nerve, right upper limb: Secondary | ICD-10-CM | POA: Diagnosis not present

## 2017-12-25 DIAGNOSIS — L03116 Cellulitis of left lower limb: Secondary | ICD-10-CM | POA: Diagnosis not present

## 2017-12-25 DIAGNOSIS — I69354 Hemiplegia and hemiparesis following cerebral infarction affecting left non-dominant side: Secondary | ICD-10-CM | POA: Diagnosis not present

## 2017-12-26 DIAGNOSIS — F332 Major depressive disorder, recurrent severe without psychotic features: Secondary | ICD-10-CM | POA: Diagnosis not present

## 2017-12-28 DIAGNOSIS — D860 Sarcoidosis of lung: Secondary | ICD-10-CM | POA: Diagnosis not present

## 2017-12-28 DIAGNOSIS — G5621 Lesion of ulnar nerve, right upper limb: Secondary | ICD-10-CM | POA: Diagnosis not present

## 2017-12-28 DIAGNOSIS — I69354 Hemiplegia and hemiparesis following cerebral infarction affecting left non-dominant side: Secondary | ICD-10-CM | POA: Diagnosis not present

## 2017-12-28 DIAGNOSIS — E1151 Type 2 diabetes mellitus with diabetic peripheral angiopathy without gangrene: Secondary | ICD-10-CM | POA: Diagnosis not present

## 2017-12-28 DIAGNOSIS — H913 Deaf nonspeaking, not elsewhere classified: Secondary | ICD-10-CM | POA: Diagnosis not present

## 2017-12-28 DIAGNOSIS — L03116 Cellulitis of left lower limb: Secondary | ICD-10-CM | POA: Diagnosis not present

## 2018-01-01 DIAGNOSIS — G5621 Lesion of ulnar nerve, right upper limb: Secondary | ICD-10-CM | POA: Diagnosis not present

## 2018-01-01 DIAGNOSIS — E1151 Type 2 diabetes mellitus with diabetic peripheral angiopathy without gangrene: Secondary | ICD-10-CM | POA: Diagnosis not present

## 2018-01-01 DIAGNOSIS — L03116 Cellulitis of left lower limb: Secondary | ICD-10-CM | POA: Diagnosis not present

## 2018-01-01 DIAGNOSIS — I69354 Hemiplegia and hemiparesis following cerebral infarction affecting left non-dominant side: Secondary | ICD-10-CM | POA: Diagnosis not present

## 2018-01-01 DIAGNOSIS — H913 Deaf nonspeaking, not elsewhere classified: Secondary | ICD-10-CM | POA: Diagnosis not present

## 2018-01-01 DIAGNOSIS — D860 Sarcoidosis of lung: Secondary | ICD-10-CM | POA: Diagnosis not present

## 2018-01-03 DIAGNOSIS — L03116 Cellulitis of left lower limb: Secondary | ICD-10-CM | POA: Diagnosis not present

## 2018-01-03 DIAGNOSIS — D860 Sarcoidosis of lung: Secondary | ICD-10-CM | POA: Diagnosis not present

## 2018-01-03 DIAGNOSIS — H913 Deaf nonspeaking, not elsewhere classified: Secondary | ICD-10-CM | POA: Diagnosis not present

## 2018-01-03 DIAGNOSIS — I69354 Hemiplegia and hemiparesis following cerebral infarction affecting left non-dominant side: Secondary | ICD-10-CM | POA: Diagnosis not present

## 2018-01-03 DIAGNOSIS — E1151 Type 2 diabetes mellitus with diabetic peripheral angiopathy without gangrene: Secondary | ICD-10-CM | POA: Diagnosis not present

## 2018-01-03 DIAGNOSIS — G5621 Lesion of ulnar nerve, right upper limb: Secondary | ICD-10-CM | POA: Diagnosis not present

## 2018-01-10 DIAGNOSIS — G5621 Lesion of ulnar nerve, right upper limb: Secondary | ICD-10-CM | POA: Diagnosis not present

## 2018-01-10 DIAGNOSIS — F99 Mental disorder, not otherwise specified: Secondary | ICD-10-CM | POA: Diagnosis not present

## 2018-01-10 DIAGNOSIS — H913 Deaf nonspeaking, not elsewhere classified: Secondary | ICD-10-CM | POA: Diagnosis not present

## 2018-01-10 DIAGNOSIS — D860 Sarcoidosis of lung: Secondary | ICD-10-CM | POA: Diagnosis not present

## 2018-01-10 DIAGNOSIS — L03116 Cellulitis of left lower limb: Secondary | ICD-10-CM | POA: Diagnosis not present

## 2018-01-10 DIAGNOSIS — E1151 Type 2 diabetes mellitus with diabetic peripheral angiopathy without gangrene: Secondary | ICD-10-CM | POA: Diagnosis not present

## 2018-01-10 DIAGNOSIS — I69354 Hemiplegia and hemiparesis following cerebral infarction affecting left non-dominant side: Secondary | ICD-10-CM | POA: Diagnosis not present

## 2018-01-12 ENCOUNTER — Other Ambulatory Visit: Payer: Self-pay

## 2018-01-12 ENCOUNTER — Encounter: Payer: Self-pay | Admitting: *Deleted

## 2018-01-12 ENCOUNTER — Emergency Department
Admission: EM | Admit: 2018-01-12 | Discharge: 2018-01-12 | Disposition: A | Discharge status: Home / Self Care | Payer: Medicare Other | Attending: Emergency Medicine | Admitting: Emergency Medicine

## 2018-01-12 ENCOUNTER — Emergency Department: Payer: Medicare Other

## 2018-01-12 DIAGNOSIS — Z8673 Personal history of transient ischemic attack (TIA), and cerebral infarction without residual deficits: Secondary | ICD-10-CM | POA: Insufficient documentation

## 2018-01-12 DIAGNOSIS — E119 Type 2 diabetes mellitus without complications: Secondary | ICD-10-CM | POA: Diagnosis not present

## 2018-01-12 DIAGNOSIS — Z87891 Personal history of nicotine dependence: Secondary | ICD-10-CM | POA: Diagnosis not present

## 2018-01-12 DIAGNOSIS — I10 Essential (primary) hypertension: Secondary | ICD-10-CM | POA: Insufficient documentation

## 2018-01-12 DIAGNOSIS — R2242 Localized swelling, mass and lump, left lower limb: Secondary | ICD-10-CM | POA: Diagnosis present

## 2018-01-12 DIAGNOSIS — F70 Mild intellectual disabilities: Secondary | ICD-10-CM

## 2018-01-12 DIAGNOSIS — J45909 Unspecified asthma, uncomplicated: Secondary | ICD-10-CM | POA: Diagnosis not present

## 2018-01-12 DIAGNOSIS — F4325 Adjustment disorder with mixed disturbance of emotions and conduct: Secondary | ICD-10-CM

## 2018-01-12 DIAGNOSIS — L03116 Cellulitis of left lower limb: Secondary | ICD-10-CM | POA: Insufficient documentation

## 2018-01-12 DIAGNOSIS — F329 Major depressive disorder, single episode, unspecified: Secondary | ICD-10-CM | POA: Insufficient documentation

## 2018-01-12 DIAGNOSIS — M7989 Other specified soft tissue disorders: Secondary | ICD-10-CM | POA: Diagnosis not present

## 2018-01-12 DIAGNOSIS — H913 Deaf nonspeaking, not elsewhere classified: Secondary | ICD-10-CM | POA: Diagnosis present

## 2018-01-12 DIAGNOSIS — R45851 Suicidal ideations: Secondary | ICD-10-CM | POA: Insufficient documentation

## 2018-01-12 DIAGNOSIS — Z79899 Other long term (current) drug therapy: Secondary | ICD-10-CM | POA: Insufficient documentation

## 2018-01-12 LAB — CBC
HEMATOCRIT: 39.9 % (ref 39.0–52.0)
HEMOGLOBIN: 13.4 g/dL (ref 13.0–17.0)
MCH: 30.8 pg (ref 26.0–34.0)
MCHC: 33.6 g/dL (ref 30.0–36.0)
MCV: 91.7 fL (ref 80.0–100.0)
NRBC: 0 % (ref 0.0–0.2)
Platelets: 113 10*3/uL — ABNORMAL LOW (ref 150–400)
RBC: 4.35 MIL/uL (ref 4.22–5.81)
RDW: 12.3 % (ref 11.5–15.5)
WBC: 4.9 10*3/uL (ref 4.0–10.5)

## 2018-01-12 LAB — URINE DRUG SCREEN, QUALITATIVE (ARMC ONLY)
AMPHETAMINES, UR SCREEN: NOT DETECTED
Barbiturates, Ur Screen: NOT DETECTED
Benzodiazepine, Ur Scrn: NOT DETECTED
COCAINE METABOLITE, UR ~~LOC~~: NOT DETECTED
Cannabinoid 50 Ng, Ur ~~LOC~~: NOT DETECTED
MDMA (Ecstasy)Ur Screen: NOT DETECTED
Methadone Scn, Ur: NOT DETECTED
OPIATE, UR SCREEN: NOT DETECTED
Phencyclidine (PCP) Ur S: NOT DETECTED
TRICYCLIC, UR SCREEN: NOT DETECTED

## 2018-01-12 LAB — COMPREHENSIVE METABOLIC PANEL
ALBUMIN: 4.3 g/dL (ref 3.5–5.0)
ALT: 29 U/L (ref 0–44)
AST: 35 U/L (ref 15–41)
Alkaline Phosphatase: 57 U/L (ref 38–126)
Anion gap: 8 (ref 5–15)
BUN: 9 mg/dL (ref 8–23)
CHLORIDE: 101 mmol/L (ref 98–111)
CO2: 29 mmol/L (ref 22–32)
Calcium: 8.7 mg/dL — ABNORMAL LOW (ref 8.9–10.3)
Creatinine, Ser: 0.97 mg/dL (ref 0.61–1.24)
GFR calc Af Amer: >60 mL/min (ref >60)
GFR calc non Af Amer: >60 mL/min (ref >60)
GLUCOSE: 96 mg/dL (ref 70–99)
POTASSIUM: 3.9 mmol/L (ref 3.5–5.1)
SODIUM: 138 mmol/L (ref 135–145)
Total Bilirubin: 0.7 mg/dL (ref 0.3–1.2)
Total Protein: 7 g/dL (ref 6.5–8.1)

## 2018-01-12 LAB — SALICYLATE LEVEL: Salicylate Lvl: <7 mg/dL (ref 2.8–30.0)

## 2018-01-12 LAB — ACETAMINOPHEN LEVEL: Acetaminophen (Tylenol), Serum: <10 ug/mL — ABNORMAL LOW (ref 10–30)

## 2018-01-12 LAB — ETHANOL: Alcohol, Ethyl (B): <10 mg/dL (ref <10)

## 2018-01-12 MED ORDER — CEPHALEXIN 500 MG PO CAPS: 500.0000 mg | ORAL_CAPSULE | Freq: Three times a day (TID) | ORAL | 0 refills | Status: DC

## 2018-01-12 NOTE — Discharge Instructions (Addendum)
You have been seen in the emergency department for a  psychiatric concern and left leg swelling. You have been evaluated both medically as well as psychiatrically. Please follow-up with your outpatient resources provided. Return to the emergency department for any worsening symptoms, or any thoughts of hurting yourself or anyone else so that we may attempt to help you.  Your medical work-up is most consistent with mild cellulitis of your left leg.  Please take your antibiotics as prescribed and follow-up with your primary care doctor this coming week for recheck/reevaluation.  Return to the emergency department for any leg pain or fever.  An ultrasound was performed today and is negative for any blood clot.

## 2018-01-12 NOTE — ED Notes (Signed)
[phone] brother [name], pt's legal guardian, provided passcode [phone].

## 2018-01-12 NOTE — ED Notes (Addendum)
Pt has denied SI, HI, and is ready to be discharged back to [facility].

## 2018-01-12 NOTE — ED Provider Notes (Signed)
[facility] Emergency Department Provider Note  Time seen: 4:51 PM  I have reviewed the triage vital signs and the nursing notes.   HISTORY  Chief Complaint Leg Swelling and IVC    HPI Charles [name] is a 62 y.o. male with a past medical history of asthma, anemia, gastric reflux, seizure, CVA, diabetes, deaf, presents to the emergency department for left leg swelling, IVC for SI.  According to report the patient was at the group facility when he was hitting his head against a picture, please were called to intervene, at that time he wrote down on a piece paper that he wanted to kill himself so they placed him under IVC and brought him to the emergency department.  As a secondary complaint they did note swelling to his left leg and wanted this evaluated as well.  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  Sign language interpreter were used for this evaluation.  Patient denies any SI at this time.  He states the leg has been swelling on and off since 1974.  Patient has had surgery to the leg previously.  Denies any pain in the leg.  Denies any fever or nausea or vomiting.   Past Medical History:  Diagnosis Date  . Anemia   . Asthma   . Deaf   . Essential hypertension   . GERD (gastroesophageal reflux disease)   . History of migraine   . History of stroke    Spastic hemiplegia, right MCA stroke April 2014 in [place]  . Mixed hyperlipidemia   . Other pancytopenia ([place]) 12/23/2015  . PAD (peripheral artery disease) ([facility])   . Sarcoidosis   . Seizures ([place])   . Stroke ([place])   . Type 2 diabetes mellitus [facility])     Patient Active Problem List   Diagnosis Date Noted  . Nonintractable headache 06/27/2017  . Mixed hyperlipidemia 05/17/2017  . Uncontrolled type 2 diabetes mellitus with hyperglycemia ([place]) 05/16/2017  . Thrombocytopenia ([place]) 09/13/2016  . Hypoglycemia   . Syncope 09/12/2016   . Seizure disorder as sequela of cerebrovascular accident ([place]) 09/12/2016  . MDD (major depressive disorder), recurrent episode, severe ([place]) 12/30/2015  . Suicidal ideation   . Other pancytopenia ([place]) 12/23/2015  . Loss of weight 11/12/2015  . 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 Iron deficiency anemia 09/22/2015  . Spinal stenosis in cervical region 08/21/2015  . Neck pain 08/12/2015  . Low back pain 08/12/2015  . Abnormality of gait 08/12/2015  . Neuropathy of right upper extremity 06/29/2015  . Acute rhinitis 05/05/2015  . Abdominal pain, epigastric 04/23/2015  . Arm pain, right 04/23/2015  . Atypical chest pain 07/23/2013  . 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 Dyspepsia 06/13/2013  . Dysphagia 06/13/2013  . Headache 11/30/2012  . Solitary pulmonary nodule 11/20/2012  . Spastic hemiplegia affecting left nondominant side ([place]) 11/08/2012  . Essential hypertension, benign 10/12/2012  . COPD GOLD II with reversibility  10/12/2012  . 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 CVA (cerebral vascular accident) ([place]) 10/03/2012  . Diabetes ([place]) 10/03/2012  . Sarcoidosis ([place]) 10/03/2012    Past Surgical History:  Procedure Laterality Date  . BRAVO PH STUDY N/A 06/18/2013   pH STUDY SHOWS NEXIUM TWICE DAILY CONTROLS THE ACID IN HIS STOMACH. HE HAD VERY FEWEPISODE OF REGURGITATION RECORDED IN THE 2 DAYS THE STUDY WAS PERFORMED.   [name] COLONOSCOPY  2011   [place]  .  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   . ESOPHAGOGASTRODUODENOSCOPY N/A 05/05/2015   Procedure: ESOPHAGOGASTRODUODENOSCOPY (EGD);  Surgeon: Danie Binder, MD;  [facility];  Service: Endoscopy;   Laterality: N/A;  730  . MALONEY DILATION N/A 06/18/2013   Procedure: MALONEY DILATION;  Surgeon: Danie Binder, MD;  Location: [facility];  Service: Endoscopy;  Laterality: N/A;  . OTHER SURGICAL HISTORY     cyst removal head and chest  . POSTERIOR CERVICAL FUSION/FORAMINOTOMY N/A 10/07/2015   Procedure: Cervical Three-Four, Cervical Four-Five, Cervical Five-Six, Cervical Six-Seven Posterior Cervical Laminectomy and Fusion;  Surgeon: Kary Kos, MD;  Location: [place] NEURO ORS;  Service: Neurosurgery;  Laterality: N/A;  . SAVORY DILATION N/A 06/18/2013   Procedure: SAVORY DILATION;  Surgeon: Danie Binder, MD;  Location[facility];  Service: Endoscopy;  Laterality: N/A;  . TEE WITHOUT CARDIOVERSION N/A 10/04/2012   Procedure: TRANSESOPHAGEAL ECHOCARDIOGRAM (TEE);  Surgeon: Thayer Headings, MD;  Location: [place];  Service: Cardiovascular;  Laterality: N/A;  . [place] accident  Both legs fractured and repaired surgically    Prior to Admission medications   Medication Sig Start Date End Date Taking? Authorizing Provider  albuterol (PROAIR HFA) 108 (90 Base) MCG/ACT inhaler Inhale 2 puffs into the lungs every 4 (four) hours as needed for wheezing or shortness of breath.     [provider]  alum & mag hydroxide-simeth (GERI-LANTA) 200-200-20 MG/5ML suspension Take 30 mLs by mouth every 4 (four) hours as needed for indigestion or heartburn.    [provider]  atorvastatin (LIPITOR) 10 MG tablet Take 10 mg by mouth daily. (cholesterol)    [provider]  cholecalciferol (VITAMIN D) 1000 units tablet Take 1,000 Units by mouth daily.    [provider]  Cholecalciferol (VITAMIN D3) 1000 units CAPS Take 1 capsule (1,000 Units total) by mouth daily. 08/30/17   Marcial Pacas, MD  clopidogrel (PLAVIX) 75 MG tablet Take 75 mg by mouth daily.    [provider]  diazepam (VALIUM) 2 MG tablet Take 1 tablet (2 mg total) by mouth  every 8 (eight) hours as needed for muscle spasms. 09/08/17 09/08/18  Eula Listen, MD  diclofenac sodium (VOLTAREN) 1 % GEL Apply 1 g topically 3 (three) times daily as needed (pain).    [provider]  escitalopram (LEXAPRO) 10 MG tablet Take 1 tablet (10 mg total) by mouth daily. Patient taking differently: Take 20 mg by mouth daily.  06/27/17   Marcial Pacas, MD  gabapentin (NEURONTIN) 300 MG capsule Take 300 mg by mouth 3 (three) times daily.    [provider]  glucose blood (ONETOUCH VERIO) test strip 1 each by Other route daily. And lancets 1/day 06/03/16   Renato Shin, MD  ibuprofen (ADVIL,MOTRIN) 600 MG tablet Take 1 tablet (600 mg total) by mouth every 8 (eight) hours as needed. 11/18/17   Nance Pear, MD  IncobotulinumtoxinA ([name] IM) Inject 500 Units into the muscle every 3 (three) months.    [provider]  lamoTRIgine (LAMICTAL) 100 MG tablet Take 1 tablet (100 mg total) by mouth 2 (two) times daily. 08/30/17   Marcial Pacas, MD  losartan (COZAAR) 25 MG tablet Take 25  mg by mouth daily.     [provider]  tamsulosin (FLOMAX) 0.4 MG CAPS capsule Take 0.4 mg by mouth at bedtime.    [provider]  traMADol (ULTRAM) 50 MG tablet Take 1 tablet (50 mg total) by mouth every 6 (six) hours as needed. 03/04/17   Maczis, Barth Kirks, PA-C  traZODone (DESYREL) 50 MG tablet Take 50 mg by mouth at bedtime.    [provider]    Allergies  Allergen Reactions  . Hydrocodone Other (See Comments)    Makes "looney"  . Shellfish Allergy Hives and Swelling  . Metformin Diarrhea  . Contrast Media [Iodinated Diagnostic Agents] Itching  . Oxycodone Nausea And Vomiting    Family History  Problem Relation Age of Onset  . Diabetes Mother   . Hypertension Mother   . Diabetes Father   . Hypertension Father   . Colon polyps Neg Hx   . Colon cancer Neg Hx     Social History Social History   Tobacco Use  . Smoking status: Former  Smoker    Packs/day: 1.00    Years: 29.00    Pack years: 29.00    Types: Cigarettes    Last attempt to quit: 02/22/1992    Years since quitting: 25.9  . Smokeless tobacco: Never Used  Substance Use Topics  . Alcohol use: Yes    Alcohol/week: 1.0 - 2.0 standard drinks    Types: 1 - 2 Cans of beer per week    Comment: occasionally, every 2-3 days  . Drug use: Yes    Types: Marijuana, "Crack" cocaine    Comment: hx of crack cocaine "long time ago" per pt - hard to tell how long it is has been since he used    Review of Systems Constitutional: Negative for fever. Cardiovascular: Negative for chest pain. Respiratory: Negative for shortness of breath. Gastrointestinal: Negative for abdominal pain, vomiting Genitourinary: Negative for urinary compaints Musculoskeletal: Left leg swelling, chronic per patient. Skin: Negative for skin complaints  Neurological: Negative for headache All other ROS negative  ____________________________________________   PHYSICAL EXAM:  VITAL SIGNS: ED Triage Vitals  Enc Vitals Group     BP 01/12/18 1442 (!) 160/83     Pulse Rate 01/12/18 1442 70     Resp 01/12/18 1442 16     Temp 01/12/18 1442 98 F (36.7 C)     Temp Source 01/12/18 1442 Oral     SpO2 01/12/18 1442 97 %     Weight 01/12/18 1444 166 lb 0.1 oz (75.3 kg)     Height --      Head Circumference --      Peak Flow --      Pain Score --      Pain Loc --      Pain Edu? --      Excl. in [place]? --    Constitutional: Alert and oriented. Well appearing and in no distress. Eyes: Normal exam ENT   Head: Normocephalic and atraumatic.   Mouth/Throat: Mucous membranes are moist. Cardiovascular: Normal rate, regular rhythm. No murmur Respiratory: Normal respiratory effort without tachypnea nor retractions. Breath sounds are clear  Gastrointestinal: Soft and nontender. No distention.  Musculoskeletal: Patient has old appearing scars to left lower extremity, moderately swollen left lower  extremity with a normal-appearing right lower extremity, mildly warm to touch.  Denies any tenderness.  Neurovascular intact distally. Neurologic:  Normal speech and language. No gross focal neurologic deficits Skin:  Skin is warm,  dry and intact.  Psychiatric: Mood and affect are normal.  ____________________________________________   RADIOLOGY  Ultrasound negative for DVT  ____________________________________________   INITIAL IMPRESSION / ASSESSMENT AND PLAN / ED COURSE  Pertinent labs & imaging results that were available during my care of the patient were reviewed by me and considered in my medical decision making (see chart for details).  Patient presents to the emergency department for IVC for reported SI.  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  Denies any SI at this time.  Has been cleared from a psychiatric standpoint.  From a medical standpoint the patient does have increased swelling in the left lower extremity compared to his right lower extremity with mild warmth to the touch.  Patient's lab work is reassuring including a normal white blood cell count, afebrile with otherwise normal vitals.  Will obtain an ultrasound to rule out DVT.  If the ultrasound is negative anticipate likely discharge home with a course of antibiotics and PCP follow-up for possible cellulitis.  I discussed this with the patient via interpreter and the patient is agreeable to this plan of care.  Ultrasound negative for DVT.  Lab work is largely nonrevealing, normal white blood cell count.  We will discharge with a course of Keflex and have the patient follow-up with his doctor.  Patient has been seen by psychiatry and has been cleared from their standpoint.  ____________________________________________   FINAL CLINICAL IMPRESSION(S) / ED  DIAGNOSES  SI Cellulitis    Harvest Dark, MD 01/12/18 1900

## 2018-01-12 NOTE — Consult Note (Signed)
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  Patient brought to the hospital because of concern about suicidal ideation Referring Physician: Paduchowski Patient Identification: Charles [name] MRN:  [id number] Principal Diagnosis: Adjustment disorder with mixed disturbance of emotions and conduct Diagnosis:  Principal Problem:   Adjustment disorder with mixed disturbance of emotions and conduct Active Problems:   Deaf mutism, congenital   Suicidal ideation   Mild intellectual disability   Total Time spent with patient: 1 hour  Subjective:   Charles [name] is a 62 y.o. male patient admitted with "no, I just want to go home".  HPI: Patient seen chart reviewed.  Patient was interviewed with the assistance of the on line American sign language interpreter system.  Patient is able to communicate with sign and by mouthing words and some lip reading.  He was brought to the hospital because police were called to his group home this morning because the patient was banging his head.  When they got there he handed them a note saying that he wanted to kill himself.  They took him to the draining area at [facility] but they were unable to complete an assessment with a deaf person.  On interview today the patient at first answers that yes he is having suicidal thoughts.  He talks however at great length about how the whole problem is related to his girlfriend.  He says that his girlfriend was wanting to be with another man and that got him very sad.  When asked about a plan to hurt or kill himself he talks about maybe walking out into traffic.  Patient is difficult to redirect at times.  Does not seem to be able to give a clear answer about whether he is recently been depressed.  Denies suicidal ideation.  When asked about physical symptoms only reports that his left leg is hurting which  is obvious from the swelling and the cellulitis in his leg.  Patient states ultimately that he wants to go home.  When I pointed out to him that this would be dangerous if he was suicidal the patient insisted laughingly that he had no suicidal thoughts at all.  Completely denies any wish to harm himself.  Went on at length about how much he loves his girlfriend and wants to get back to her.  Throughout the whole experience the patient has appeared to be upbeat and cooperative.  Does not look depressed or agitated.  Social history: Apparently he has family involvement mostly in the form of siblings.  He does have a guardian I believe and he lives in a group home.  Medical history: Patient has a cellulitis in the left leg which is still swollen.  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  Substance abuse history: Chart indicates that there have been times in the past where he drank but does not use any alcohol no drug abuse anytime recently  Past Psychiatric History: I can find 1 previous psychiatric assessment from almost exactly 2 years ago.  At that time the circumstances seem to have been about the same.  After careful examination and some information from the family it became clear that the patient has chronic intellectual disability probably now compounded by the results of his stroke.  There is no known history of suicide attempts.  He has  made these statements before without following through on them.  No known psychiatric hospitalization that I can identify.  Risk to Self:   Risk to Others:   Prior Inpatient Therapy:   Prior Outpatient Therapy:    Past Medical History:  Past Medical History:  Diagnosis Date  . Anemia   . Asthma   . Deaf   . Essential hypertension   . GERD (gastroesophageal reflux disease)   . History of migraine   . History of stroke    Spastic hemiplegia, right MCA stroke April 2014 in  [place]  . Mixed hyperlipidemia   . Other pancytopenia ([place]) 12/23/2015  . PAD (peripheral artery disease) ([address])   . Sarcoidosis   . Seizures ([place])   . Stroke ([place])   . Type 2 diabetes mellitus ([place])     Past Surgical History:  Procedure Laterality Date  . BRAVO PH STUDY N/A 06/18/2013   pH STUDY SHOWS NEXIUM TWICE DAILY CONTROLS THE ACID IN HIS STOMACH. HE HAD VERY FEWEPISODE OF REGURGITATION RECORDED IN THE 2 DAYS THE STUDY WAS PERFORMED.   [name] COLONOSCOPY  2011   [place]  . 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   . ESOPHAGOGASTRODUODENOSCOPY N/A 05/05/2015   Procedure: ESOPHAGOGASTRODUODENOSCOPY (EGD);  Surgeon: Danie Binder, MD;  [facility];  Service: Endoscopy;  Laterality: N/A;  730  . MALONEY DILATION N/A 06/18/2013   Procedure: MALONEY DILATION;  Surgeon: Danie Binder, MD;  Location: [facility];  Service: Endoscopy;  Laterality: N/A;  . OTHER SURGICAL HISTORY     cyst removal head and chest  . POSTERIOR CERVICAL FUSION/FORAMINOTOMY N/A 10/07/2015   Procedure: Cervical Three-Four, Cervical Four-Five, Cervical Five-Six, Cervical Six-Seven Posterior Cervical Laminectomy and Fusion;  Surgeon: Kary Kos, MD;  Location: [facility];  Service: Neurosurgery;  Laterality: N/A;  . SAVORY DILATION N/A 06/18/2013   Procedure: SAVORY DILATION;  Surgeon: Danie Binder, MD;  Location: [facility];  Service: Endoscopy;  Laterality: N/A;  . TEE WITHOUT CARDIOVERSION N/A 10/04/2012   Procedure: TRANSESOPHAGEAL ECHOCARDIOGRAM (TEE);  Surgeon: Thayer Headings, MD;  Location: [facility] ENDOSCOPY;  Service: Cardiovascular;  Laterality: N/A;  . [place] accident   Both legs fractured and repaired surgically   Family History:  Family History  Problem Relation Age of Onset  . Diabetes Mother   . Hypertension Mother   . Diabetes Father   . Hypertension Father   . Colon polyps Neg Hx   . Colon cancer Neg Hx    Family Psychiatric  History: Unknown Social History:  Social History   Substance and Sexual Activity  Alcohol Use Yes  . Alcohol/week: 1.0 - 2.0 standard drinks  . Types: 1 - 2 Cans of beer per week   Comment: occasionally, every 2-3 days     Social History   Substance and Sexual Activity  Drug Use Yes  . Types: Marijuana, "Crack" cocaine   Comment: hx of crack cocaine "long time ago" per pt - hard to tell how long it is has been since he used    Social History   Socioeconomic History  . Marital status: Single    Spouse name: Not on file  . Number of children:  0  . Years of education: HS  . Highest education level: Not on file  Occupational History  . Occupation: Disabled  Social Needs  . Financial resource strain: Not on file  . Food insecurity:    Worry: Not on file    Inability: Not on file  . Transportation needs:    Medical: Not on file    Non-medical: Not on file  Tobacco Use  . Smoking status: Former Smoker    Packs/day: 1.00    Years: 29.00    Pack years: 29.00    Types: Cigarettes    Last attempt to quit: 02/22/1992    Years since quitting: 25.9  . Smokeless tobacco: Never Used  Substance and Sexual Activity  . Alcohol use: Yes    Alcohol/week: 1.0 - 2.0 standard drinks    Types: 1 - 2 Cans of beer per week    Comment: occasionally, every 2-3 days  . Drug use: Yes    Types: Marijuana, "Crack" cocaine    Comment: hx of crack cocaine "long time ago" per pt - hard to tell how long it is has been since he used  . Sexual activity: Never  Lifestyle  . Physical activity:    Days per week: Not on file    Minutes per session: Not on file  . Stress: Not on file  Relationships  . Social connections:     Talks on phone: Not on file    Gets together: Not on file    Attends religious service: Not on file    Active member of club or organization: Not on file    Attends meetings of clubs or organizations: Not on file    Relationship status: Not on file  Other Topics Concern  . Not on file  Social History Narrative   Patient lives at home with his family. Brother, sister, mother    Patient does not work.    Patient has a high school education.    Patient has one step child    Additional Social History:    Allergies:   Allergies  Allergen Reactions  . Hydrocodone Other (See Comments)    Makes "looney"  . Shellfish Allergy Hives and Swelling  . Metformin Diarrhea  . Contrast Media [Iodinated Diagnostic Agents] Itching  . Oxycodone Nausea And Vomiting    Labs:  Results for orders placed or performed during the hospital encounter of 01/12/18 (from the past 48 hour(s))  Comprehensive metabolic panel     Status: Abnormal   Collection Time: 01/12/18  2:49 PM  Result Value Ref Range   Sodium 138 135 - 145 mmol/L   Potassium 3.9 3.5 - 5.1 mmol/L   Chloride 101 98 - 111 mmol/L   CO2 29 22 - 32 mmol/L   Glucose, Bld 96 70 - 99 mg/dL   BUN 9 8 - 23 mg/dL   Creatinine, Ser 0.97 0.61 - 1.24 mg/dL   Calcium 8.7 (L) 8.9 - 10.3 mg/dL   Total Protein 7.0 6.5 - 8.1 g/dL   Albumin 4.3 3.5 - 5.0 g/dL   AST 35 15 - 41 U/L   ALT 29 0 - 44 U/L   Alkaline Phosphatase 57 38 - 126 U/L   Total Bilirubin 0.7 0.3 - 1.2 mg/dL   GFR calc non Af Amer >60 >60 mL/min   GFR calc Af Amer >60 >60 mL/min    Comment: (NOTE) The eGFR has been calculated using the CKD EPI equation. This calculation has not been  validated in all clinical situations. eGFR's persistently <60 mL/min signify possible Chronic Kidney Disease.    Anion gap 8 5 - 15    Comment: Performed at [facility], [address] [place], [place] [address]  Ethanol     Status: None   Collection Time: 01/12/18  2:49 PM  Result  Value Ref Range   Alcohol, Ethyl (B) <10 <10 mg/dL    Comment: (NOTE) Lowest detectable limit for serum alcohol is 10 mg/dL. For medical purposes only. Performed at [facility], [address] [place], [place] [address]   Salicylate level     Status: None   Collection Time: 01/12/18  2:49 PM  Result Value Ref Range   Salicylate Lvl <9.5 2.8 - 30.0 mg/dL    Comment: Performed at [facility], [address] [place], [place] [address]  Acetaminophen level     Status: Abnormal   Collection Time: 01/12/18  2:49 PM  Result Value Ref Range   Acetaminophen (Tylenol), Serum <10 (L) 10 - 30 ug/mL    Comment: (NOTE) Therapeutic concentrations vary significantly. A range of 10-30 ug/mL  may be an effective concentration for many patients. However, some  are best treated at concentrations outside of this range. Acetaminophen concentrations >150 ug/mL at 4 hours after ingestion  and >50 ug/mL at 12 hours after ingestion are often associated with  toxic reactions. Performed at [facility], [address] [place], [place] [address]   cbc     Status: Abnormal   Collection Time: 01/12/18  2:49 PM  Result Value Ref Range   WBC 4.9 4.0 - 10.5 K/uL   RBC 4.35 4.22 - 5.81 MIL/uL   Hemoglobin 13.4 13.0 - 17.0 g/dL   HCT 39.9 39.0 - 52.0 %   MCV 91.7 80.0 - 100.0 fL   MCH 30.8 26.0 - 34.0 pg   MCHC 33.6 30.0 - 36.0 g/dL   RDW 12.3 11.5 - 15.5 %   Platelets 113 (L) 150 - 400 K/uL    Comment: SPECIMEN CHECKED FOR CLOTS Immature Platelet Fraction may be clinically indicated, consider ordering this additional test [id number]    nRBC 0.0 0.0 - 0.2 %    Comment: Performed at [facility], [address] [place], [place] [address]  Urine Drug Screen, Qualitative     Status: None   Collection Time: 01/12/18  4:14 PM  Result Value Ref Range   Tricyclic, Ur Screen NONE DETECTED NONE DETECTED   Amphetamines, Ur Screen NONE DETECTED NONE DETECTED   MDMA  (Ecstasy)Ur Screen NONE DETECTED NONE DETECTED   Cocaine Metabolite,Ur [place] NONE DETECTED NONE DETECTED   Opiate, Ur Screen NONE DETECTED NONE DETECTED   Phencyclidine (PCP) Ur S NONE DETECTED NONE DETECTED   Cannabinoid 50 Ng, Ur [place] NONE DETECTED NONE DETECTED   Barbiturates, Ur Screen NONE DETECTED NONE DETECTED   Benzodiazepine, Ur Scrn NONE DETECTED NONE DETECTED   Methadone Scn, Ur NONE DETECTED NONE DETECTED    Comment: (NOTE) Tricyclics + metabolites, urine    Cutoff 1000 ng/mL Amphetamines + metabolites, urine  Cutoff 1000 ng/mL MDMA (Ecstasy), urine              Cutoff 500 ng/mL Cocaine Metabolite, urine          Cutoff 300 ng/mL Opiate + metabolites, urine        Cutoff 300 ng/mL Phencyclidine (PCP), urine         Cutoff 25 ng/mL Cannabinoid, urine  Cutoff 50 ng/mL Barbiturates + metabolites, urine  Cutoff 200 ng/mL Benzodiazepine, urine              Cutoff 200 ng/mL Methadone, urine                   Cutoff 300 ng/mL The urine drug screen provides only a preliminary, unconfirmed analytical test result and should not be used for non-medical purposes. Clinical consideration and professional judgment should be applied to any positive drug screen result due to possible interfering substances. A more specific alternate chemical method must be used in order to obtain a confirmed analytical result. Gas chromatography / mass spectrometry (GC/MS) is the preferred confirmat ory method. Performed at [facility], [address] [place], [place] [address]     Current Facility-Administered Medications  Medication Dose Route Frequency Provider Last Rate Last Dose  . incobotulinumtoxinA ([name]) 100 units injection 500 Units  500 Units Intramuscular Q90 days Marcial Pacas, MD   500 Units at 11/29/17 1509   Current Outpatient Medications  Medication Sig Dispense Refill  . albuterol (PROAIR HFA) 108 (90 Base) MCG/ACT inhaler Inhale 2 puffs into the lungs every 4  (four) hours as needed for wheezing or shortness of breath.     [name] alum & mag hydroxide-simeth (GERI-LANTA) 200-200-20 MG/5ML suspension Take 30 mLs by mouth every 4 (four) hours as needed for indigestion or heartburn.    [name] atorvastatin (LIPITOR) 10 MG tablet Take 10 mg by mouth daily. (cholesterol)    . cholecalciferol (VITAMIN D) 1000 units tablet Take 1,000 Units by mouth daily.    . Cholecalciferol (VITAMIN D3) 1000 units CAPS Take 1 capsule (1,000 Units total) by mouth daily. 90 capsule 4  . clopidogrel (PLAVIX) 75 MG tablet Take 75 mg by mouth daily.    . diazepam (VALIUM) 2 MG tablet Take 1 tablet (2 mg total) by mouth every 8 (eight) hours as needed for muscle spasms. 10 tablet 0  . diclofenac sodium (VOLTAREN) 1 % GEL Apply 1 g topically 3 (three) times daily as needed (pain).    [name] escitalopram (LEXAPRO) 10 MG tablet Take 1 tablet (10 mg total) by mouth daily. (Patient taking differently: Take 20 mg by mouth daily. ) 30 tablet 11  . gabapentin (NEURONTIN) 300 MG capsule Take 300 mg by mouth 3 (three) times daily.    [name] glucose blood (ONETOUCH VERIO) test strip 1 each by Other route daily. And lancets 1/day 100 each 3  . ibuprofen (ADVIL,MOTRIN) 600 MG tablet Take 1 tablet (600 mg total) by mouth every 8 (eight) hours as needed. 20 tablet 0  . IncobotulinumtoxinA ([name] IM) Inject 500 Units into the muscle every 3 (three) months.    . lamoTRIgine (LAMICTAL) 100 MG tablet Take 1 tablet (100 mg total) by mouth 2 (two) times daily. 60 tablet 11  . losartan (COZAAR) 25 MG tablet Take 25 mg by mouth daily.     . tamsulosin (FLOMAX) 0.4 MG CAPS capsule Take 0.4 mg by mouth at bedtime.    . traMADol (ULTRAM) 50 MG tablet Take 1 tablet (50 mg total) by mouth every 6 (six) hours as needed. 15 tablet 0  . traZODone (DESYREL) 50 MG tablet Take 50 mg by mouth at bedtime.      Musculoskeletal: Strength & Muscle Tone: decreased Gait & Station: unsteady Patient leans: N/A  Psychiatric Specialty  Exam: Physical Exam  Nursing note and vitals reviewed. Constitutional: He appears well-developed and well-nourished.  HENT:  Head:  Normocephalic and atraumatic.    Eyes: Pupils are equal, round, and reactive to light. Conjunctivae are normal.  Neck: Normal range of motion.  Cardiovascular: Normal heart sounds.  Respiratory: Effort normal.  GI: Soft.  Musculoskeletal: Normal range of motion.  Neurological: He is alert. He displays atrophy. He exhibits abnormal muscle tone.  Patient's left side is weak as the result of a past stroke.  Completely unable to use his left upper extremity.  Minimal use or feeling in the left lower extremity.  Skin: Skin is warm and dry.  Psychiatric: He has a normal mood and affect. His speech is normal. He is not agitated, not aggressive and not hyperactive. Thought content is not paranoid. Cognition and memory are impaired. He expresses impulsivity. He expresses suicidal ideation. He expresses no homicidal ideation. He expresses no suicidal plans.    Review of Systems  Constitutional: Negative.   HENT: Negative.   Eyes: Negative.   Respiratory: Negative.   Cardiovascular: Negative.   Gastrointestinal: Negative.   Musculoskeletal: Negative.   Skin: Negative.   Neurological: Positive for sensory change, focal weakness and weakness.  Psychiatric/Behavioral: Positive for suicidal ideas. Negative for depression, hallucinations, memory loss and substance abuse. The patient is not nervous/anxious and does not have insomnia.     Blood pressure (!) 137/101, pulse 69, temperature 98 F (36.7 C), temperature source Oral, resp. rate 13, weight 75.3 kg, SpO2 99 %.Body mass index is 26.79 kg/m.  General Appearance: Casual  Eye Contact:  Fair  Speech:  Patient speaks almost none but he communicates reasonably well with American sign language although he is limited by only being able to use one hand  Volume:  Normal  Mood:  Euthymic  Affect:  Congruent  Thought  Process:  Goal Directed  Orientation:  Full (Time, Place, and Person)  Thought Content:  Rumination and Tangential  Suicidal Thoughts:  No  Homicidal Thoughts:  No  Memory:  Immediate;   Fair Recent;   Poor Remote;   Poor  Judgement:  Impaired  Insight:  Lacking  Psychomotor Activity:  Decreased  Concentration:  Concentration: Poor  Recall:  [place] of Knowledge:  Poor  Language:  Poor  Akathisia:  No  Handed:  Right  AIMS (if indicated):     Assets:  Desire for Improvement Housing Social Support  ADL's:  Impaired  Cognition:  Impaired,  Mild  Sleep:        Treatment Plan Summary: Plan Patient ultimately seems to be able to make it very clear that he has no actual wish to kill himself.  He has documented previously it seems like his thinking is very concrete.  Very typical of intellectual disability now with the complications of the stroke.  Patient actually had an upbeat affect.  Able to express positive plans for the future.  Does not appear to be at elevated risk of suicide.  Does not appear to require psychiatric hospitalization.  Discontinue IVC.  Case reviewed with emergency room doctor.  Disposition: No evidence of imminent risk to self or others at present.   Patient does not meet criteria for psychiatric inpatient admission. Supportive therapy provided about ongoing stressors.  Alethia Berthold, MD 01/12/2018 6:22 PM

## 2018-01-12 NOTE — ED Notes (Signed)
E-signature not working so pt unable to sign. Pt understood discharge teaching and prescriptions and follow-up. Pt discharged with BPD.

## 2018-01-12 NOTE — BH Assessment (Signed)
Assessment Note  Charles [name] is an 62 y.o. male who presents to the ER due to his care home having concerns about sharing he was having SI.   Per the report the, he was upset because his girlfriend no longer wanted to be in a relationship with him. He admits to hitting his head on the wall at the care facility.  During the interview, the patient was focused on his relationship with the Girlfriend. Each question he answered, he would reference her. He states he is doing well and was going to pray about the "situation."  Writer spoke with patient's brother/guardian [name] Eng-[phone]) and he was aware the patient was been discharged back to the facility. "They (facility), told me they was going to send him back in a ambulance."  Diagnosis: Depression  Past Medical History:  Past Medical History:  Diagnosis Date  . Anemia   . Asthma   . Deaf   . Essential hypertension   . GERD (gastroesophageal reflux disease)   . History of migraine   . History of stroke    Spastic hemiplegia, right MCA stroke April 2014 in [place]  . Mixed hyperlipidemia   . Other pancytopenia ([place]) 12/23/2015  . PAD (peripheral artery disease) ([place])   . Sarcoidosis   . Seizures ([place])   . Stroke ([place])   . Type 2 diabetes mellitus ([place])     Past Surgical History:  Procedure Laterality Date  . BRAVO PH STUDY N/A 06/18/2013   pH STUDY SHOWS NEXIUM TWICE DAILY CONTROLS THE ACID IN HIS STOMACH. HE HAD VERY FEWEPISODE OF REGURGITATION RECORDED IN THE 2 DAYS THE STUDY WAS PERFORMED.   [name] COLONOSCOPY  2011   [place]  . 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   . ESOPHAGOGASTRODUODENOSCOPY N/A 05/05/2015    Procedure: ESOPHAGOGASTRODUODENOSCOPY (EGD);  Surgeon: Danie Binder, MD;  [facility];  Service: Endoscopy;  Laterality: N/A;  730  . MALONEY DILATION N/A 06/18/2013   Procedure: MALONEY DILATION;  Surgeon: Danie Binder, MD;  [facility];  Service: Endoscopy;  Laterality: N/A;  . OTHER SURGICAL HISTORY     cyst removal head and chest  . POSTERIOR CERVICAL FUSION/FORAMINOTOMY N/A 10/07/2015   Procedure: Cervical Three-Four, Cervical Four-Five, Cervical Five-Six, Cervical Six-Seven Posterior Cervical Laminectomy and Fusion;  Surgeon: Kary Kos, MD;  Location: [facility];  Service: Neurosurgery;  Laterality: N/A;  . SAVORY DILATION N/A 06/18/2013   Procedure: SAVORY DILATION;  Surgeon: Danie Binder, MD;  [facility];  Service: Endoscopy;  Laterality: N/A;  . TEE WITHOUT CARDIOVERSION N/A 10/04/2012   Procedure: TRANSESOPHAGEAL ECHOCARDIOGRAM (TEE);  Surgeon: Thayer Headings, MD;  Location: [facility] ENDOSCOPY;  Service: Cardiovascular;  Laterality: N/A;  . [place] accident  Both legs fractured and repaired surgically    Family History:  Family History  Problem Relation Age of Onset  . Diabetes Mother   . Hypertension Mother   . Diabetes Father   . Hypertension Father   . Colon polyps Neg Hx   . Colon cancer Neg Hx     Social History:  reports that he quit smoking about 25 years ago. His smoking use included cigarettes. He has a  29.00 pack-year smoking history. He has never used smokeless tobacco. He reports that he drinks about 1.0 - 2.0 standard drinks of alcohol per week. He reports that he has current or past drug history. Drugs: Marijuana and "Crack" cocaine.  Additional Social History:  Alcohol / Drug Use Pain Medications: See PTA Prescriptions: Reports of none Over the Counter: Reports of none History of alcohol / drug use?: No history of alcohol / drug abuse Longest period of sobriety (when/how long): Reports of  none Negative Consequences of Use: (n/a) Withdrawal Symptoms: (n/a)  CIWA: CIWA-Ar BP: (!) 166/93 Pulse Rate: 84 COWS:    Allergies:  Allergies  Allergen Reactions  . Hydrocodone Other (See Comments)    Makes "looney"  . Shellfish Allergy Hives and Swelling  . Metformin Diarrhea  . Contrast Media [Iodinated Diagnostic Agents] Itching  . Oxycodone Nausea And Vomiting    Home Medications:  (Not in a hospital admission)  OB/GYN Status:  No LMP for male patient.  General Assessment Data Location of Assessment: [facility] ED TTS Assessment: In system Is this a Tele or Face-to-Face Assessment?: Face-to-Face Is this an Initial Assessment or a Re-assessment for this encounter?: Initial Assessment Language Other than English: No Living Arrangements: In Assisted Living/Nursing Home (Comment: Name of Nursing Home([facility]) What gender do you identify as?: Male Marital status: Single Pregnancy Status: No Living Arrangements: Group Home Can pt return to current living arrangement?: Yes Admission Status: Voluntary Is patient capable of signing voluntary admission?: No(Have a guardian) Referral Source: Self/Family/Friend Insurance type: Medicare  Medical Screening Exam ([place]) Medical Exam completed: Yes  Crisis Care Plan Living Arrangements: Group Home Legal Guardian: Other relative Name of Psychiatrist: Reports of none Name of Therapist: Reports of none  Education Status Is patient currently in school?: No Is the patient employed, unemployed or receiving disability?: Unemployed, Receiving disability income  Risk to self with the past 6 months Suicidal Ideation: No Has patient been a risk to self within the past 6 months prior to admission? : No Suicidal Intent: No Has patient had any suicidal intent within the past 6 months prior to admission? : No Is patient at risk for suicide?: No Suicidal Plan?: No Has patient had any suicidal plan  within the past 6 months prior to admission? : No Access to Means: No What has been your use of drugs/alcohol within the last 12 months?: Reports of none Previous Attempts/Gestures: No How many times?: 0 Other Self Harm Risks: Reports of none Triggers for Past Attempts: None known Intentional Self Injurious Behavior: None Family Suicide History: No Recent stressful life event(s): Other (Comment)(Girlfriend) Persecutory voices/beliefs?: No Depression: No Depression Symptoms: (Reports of none) Substance abuse history and/or treatment for substance abuse?: No Suicide prevention information given to non-admitted patients: Not applicable  Risk to Others within the past 6 months Homicidal Ideation: No Does patient have any lifetime risk of violence toward others beyond the six months prior to admission? : No Thoughts of Harm to Others: No Current Homicidal Intent: No Current Homicidal Plan: No Access to Homicidal Means: No Identified Victim: Reports of none History of harm to others?: No Assessment of Violence: None Noted Violent Behavior Description: Reports of none Does patient have access to weapons?: No Criminal Charges Pending?: No Does patient have a court date: No Is patient on probation?: No  Psychosis Hallucinations: None noted Delusions: None noted  Mental Status Report Appearance/Hygiene: Unremarkable, In scrubs Eye Contact: Fair Motor Activity: Freedom of movement, Unremarkable  Speech: Logical/coherent Level of Consciousness: Alert Mood: Anxious, Pleasant Affect: Appropriate to circumstance Anxiety Level: Minimal Thought Processes: Coherent, Relevant Judgement: Unimpaired Orientation: Person, Place, Time, Situation, Appropriate for developmental age Obsessive Compulsive Thoughts/Behaviors: Minimal  Cognitive Functioning Concentration: Normal Memory: Recent Intact, Remote Intact Is patient IDD: No Insight: Fair Impulse Control: Fair Appetite: Good Have  you had any weight changes? : No Change Sleep: No Change Total Hours of Sleep: 8 Vegetative Symptoms: None  ADLScreening [facility]) Patient's cognitive ability adequate to safely complete daily activities?: Yes Patient able to express need for assistance with ADLs?: Yes Independently performs ADLs?: Yes (appropriate for developmental age)  Prior Inpatient Therapy Prior Inpatient Therapy: No  Prior Outpatient Therapy Prior Outpatient Therapy: No Does patient have an ACCT team?: No Does patient have Intensive In-House Services?  : No Does patient have [facility] services? : No Does patient have P4CC services?: No  ADL Screening (condition at time of admission) Patient's cognitive ability adequate to safely complete daily activities?: Yes Patient able to express need for assistance with ADLs?: Yes Independently performs ADLs?: Yes (appropriate for developmental age)       Abuse/Neglect Assessment (Assessment to be complete while patient is alone) Abuse/Neglect Assessment Can Be Completed: Yes Physical Abuse: Denies Verbal Abuse: Denies Sexual Abuse: Denies Exploitation of patient/patient's resources: Denies Self-Neglect: Denies Values / Beliefs Cultural Requests During Hospitalization: None Spiritual Requests During Hospitalization: None Consults Spiritual Care Consult Needed: No Social Work Consult Needed: No Regulatory affairs officer (For Healthcare) Does Patient Have a Medical Advance Directive?: No Would patient like information on creating a medical advance directive?: No - Patient declined       Child/Adolescent Assessment Running Away Risk: Denies(Patient is an adult)  Disposition:  Disposition Initial Assessment Completed for this Encounter: Yes  On Site Evaluation by:   Reviewed with Physician:    Gunnar Fusi MS, LCAS, [place], [place], CCSI Therapeutic Triage Specialist 01/12/2018 7:09 PM

## 2018-01-12 NOTE — ED Triage Notes (Signed)
Pt to ED from [facility] under IVC papers. PT has left leg swelling with cellulitis. Pt has been taking PO antibiotics without improvement. Pt is under IVC after combative behavior at [facility] assisted living. Pt was reportedly hitting his head against a wall and hitting a picture frame. Pt reported to officer that he had SI. Pt is calm and cooperative in triage at this time but sitting with his eyes closed .   Pt is Deaf.

## 2018-01-12 NOTE — ED Notes (Signed)
Pt assisted with urinating in urinal by this tech

## 2018-01-17 DIAGNOSIS — I69354 Hemiplegia and hemiparesis following cerebral infarction affecting left non-dominant side: Secondary | ICD-10-CM | POA: Diagnosis not present

## 2018-01-17 DIAGNOSIS — L03116 Cellulitis of left lower limb: Secondary | ICD-10-CM | POA: Diagnosis not present

## 2018-01-17 DIAGNOSIS — G5621 Lesion of ulnar nerve, right upper limb: Secondary | ICD-10-CM | POA: Diagnosis not present

## 2018-01-17 DIAGNOSIS — H913 Deaf nonspeaking, not elsewhere classified: Secondary | ICD-10-CM | POA: Diagnosis not present

## 2018-01-17 DIAGNOSIS — D860 Sarcoidosis of lung: Secondary | ICD-10-CM | POA: Diagnosis not present

## 2018-01-17 DIAGNOSIS — E1151 Type 2 diabetes mellitus with diabetic peripheral angiopathy without gangrene: Secondary | ICD-10-CM | POA: Diagnosis not present

## 2018-01-19 DIAGNOSIS — R350 Frequency of micturition: Secondary | ICD-10-CM | POA: Diagnosis not present

## 2018-01-19 DIAGNOSIS — E559 Vitamin D deficiency, unspecified: Secondary | ICD-10-CM | POA: Diagnosis not present

## 2018-01-19 DIAGNOSIS — Z299 Encounter for prophylactic measures, unspecified: Secondary | ICD-10-CM | POA: Diagnosis not present

## 2018-01-19 DIAGNOSIS — L0390 Cellulitis, unspecified: Secondary | ICD-10-CM | POA: Diagnosis not present

## 2018-01-19 DIAGNOSIS — I10 Essential (primary) hypertension: Secondary | ICD-10-CM | POA: Diagnosis not present

## 2018-01-19 DIAGNOSIS — K219 Gastro-esophageal reflux disease without esophagitis: Secondary | ICD-10-CM | POA: Diagnosis not present

## 2018-01-19 DIAGNOSIS — Z6828 Body mass index (BMI) 28.0-28.9, adult: Secondary | ICD-10-CM | POA: Diagnosis not present

## 2018-01-23 DIAGNOSIS — D860 Sarcoidosis of lung: Secondary | ICD-10-CM | POA: Diagnosis not present

## 2018-01-23 DIAGNOSIS — H913 Deaf nonspeaking, not elsewhere classified: Secondary | ICD-10-CM | POA: Diagnosis not present

## 2018-01-23 DIAGNOSIS — L03116 Cellulitis of left lower limb: Secondary | ICD-10-CM | POA: Diagnosis not present

## 2018-01-23 DIAGNOSIS — E1151 Type 2 diabetes mellitus with diabetic peripheral angiopathy without gangrene: Secondary | ICD-10-CM | POA: Diagnosis not present

## 2018-01-23 DIAGNOSIS — I69354 Hemiplegia and hemiparesis following cerebral infarction affecting left non-dominant side: Secondary | ICD-10-CM | POA: Diagnosis not present

## 2018-01-23 DIAGNOSIS — G5621 Lesion of ulnar nerve, right upper limb: Secondary | ICD-10-CM | POA: Diagnosis not present

## 2018-01-24 DIAGNOSIS — E1151 Type 2 diabetes mellitus with diabetic peripheral angiopathy without gangrene: Secondary | ICD-10-CM | POA: Diagnosis not present

## 2018-01-24 DIAGNOSIS — G5621 Lesion of ulnar nerve, right upper limb: Secondary | ICD-10-CM | POA: Diagnosis not present

## 2018-01-24 DIAGNOSIS — L03116 Cellulitis of left lower limb: Secondary | ICD-10-CM | POA: Diagnosis not present

## 2018-01-24 DIAGNOSIS — I69354 Hemiplegia and hemiparesis following cerebral infarction affecting left non-dominant side: Secondary | ICD-10-CM | POA: Diagnosis not present

## 2018-01-24 DIAGNOSIS — H913 Deaf nonspeaking, not elsewhere classified: Secondary | ICD-10-CM | POA: Diagnosis not present

## 2018-01-24 DIAGNOSIS — D860 Sarcoidosis of lung: Secondary | ICD-10-CM | POA: Diagnosis not present

## 2018-01-26 DIAGNOSIS — L03116 Cellulitis of left lower limb: Secondary | ICD-10-CM | POA: Diagnosis not present

## 2018-01-26 DIAGNOSIS — R600 Localized edema: Secondary | ICD-10-CM | POA: Diagnosis not present

## 2018-01-30 DIAGNOSIS — L03116 Cellulitis of left lower limb: Secondary | ICD-10-CM | POA: Diagnosis not present

## 2018-01-30 DIAGNOSIS — Z91041 Radiographic dye allergy status: Secondary | ICD-10-CM | POA: Diagnosis not present

## 2018-01-30 DIAGNOSIS — I69354 Hemiplegia and hemiparesis following cerebral infarction affecting left non-dominant side: Secondary | ICD-10-CM | POA: Diagnosis not present

## 2018-01-30 DIAGNOSIS — E11628 Type 2 diabetes mellitus with other skin complications: Secondary | ICD-10-CM | POA: Diagnosis not present

## 2018-01-30 DIAGNOSIS — I82402 Acute embolism and thrombosis of unspecified deep veins of left lower extremity: Secondary | ICD-10-CM | POA: Diagnosis not present

## 2018-01-30 DIAGNOSIS — I10 Essential (primary) hypertension: Secondary | ICD-10-CM | POA: Diagnosis not present

## 2018-01-31 DIAGNOSIS — L03116 Cellulitis of left lower limb: Secondary | ICD-10-CM | POA: Diagnosis not present

## 2018-01-31 DIAGNOSIS — E11628 Type 2 diabetes mellitus with other skin complications: Secondary | ICD-10-CM | POA: Diagnosis not present

## 2018-01-31 DIAGNOSIS — I69354 Hemiplegia and hemiparesis following cerebral infarction affecting left non-dominant side: Secondary | ICD-10-CM | POA: Diagnosis not present

## 2018-01-31 DIAGNOSIS — I82402 Acute embolism and thrombosis of unspecified deep veins of left lower extremity: Secondary | ICD-10-CM | POA: Diagnosis not present

## 2018-02-01 DIAGNOSIS — E11628 Type 2 diabetes mellitus with other skin complications: Secondary | ICD-10-CM | POA: Diagnosis not present

## 2018-02-01 DIAGNOSIS — I69354 Hemiplegia and hemiparesis following cerebral infarction affecting left non-dominant side: Secondary | ICD-10-CM | POA: Diagnosis not present

## 2018-02-01 DIAGNOSIS — I82402 Acute embolism and thrombosis of unspecified deep veins of left lower extremity: Secondary | ICD-10-CM | POA: Diagnosis not present

## 2018-02-01 DIAGNOSIS — L03116 Cellulitis of left lower limb: Secondary | ICD-10-CM | POA: Diagnosis not present

## 2018-02-02 DIAGNOSIS — I82402 Acute embolism and thrombosis of unspecified deep veins of left lower extremity: Secondary | ICD-10-CM | POA: Diagnosis not present

## 2018-02-02 DIAGNOSIS — I69354 Hemiplegia and hemiparesis following cerebral infarction affecting left non-dominant side: Secondary | ICD-10-CM | POA: Diagnosis not present

## 2018-02-02 DIAGNOSIS — L03116 Cellulitis of left lower limb: Secondary | ICD-10-CM | POA: Diagnosis not present

## 2018-02-02 DIAGNOSIS — E11628 Type 2 diabetes mellitus with other skin complications: Secondary | ICD-10-CM | POA: Diagnosis not present

## 2018-02-03 DIAGNOSIS — R2689 Other abnormalities of gait and mobility: Secondary | ICD-10-CM | POA: Diagnosis not present

## 2018-02-03 DIAGNOSIS — Z87891 Personal history of nicotine dependence: Secondary | ICD-10-CM | POA: Diagnosis not present

## 2018-02-03 DIAGNOSIS — N319 Neuromuscular dysfunction of bladder, unspecified: Secondary | ICD-10-CM | POA: Diagnosis not present

## 2018-02-03 DIAGNOSIS — R488 Other symbolic dysfunctions: Secondary | ICD-10-CM | POA: Diagnosis not present

## 2018-02-03 DIAGNOSIS — L0390 Cellulitis, unspecified: Secondary | ICD-10-CM | POA: Diagnosis not present

## 2018-02-03 DIAGNOSIS — E11628 Type 2 diabetes mellitus with other skin complications: Secondary | ICD-10-CM | POA: Diagnosis not present

## 2018-02-03 DIAGNOSIS — I69354 Hemiplegia and hemiparesis following cerebral infarction affecting left non-dominant side: Secondary | ICD-10-CM | POA: Diagnosis not present

## 2018-02-03 DIAGNOSIS — F339 Major depressive disorder, recurrent, unspecified: Secondary | ICD-10-CM | POA: Diagnosis not present

## 2018-02-03 DIAGNOSIS — B029 Zoster without complications: Secondary | ICD-10-CM | POA: Diagnosis not present

## 2018-02-03 DIAGNOSIS — J45998 Other asthma: Secondary | ICD-10-CM | POA: Diagnosis not present

## 2018-02-03 DIAGNOSIS — I6930 Unspecified sequelae of cerebral infarction: Secondary | ICD-10-CM | POA: Diagnosis not present

## 2018-02-03 DIAGNOSIS — Z885 Allergy status to narcotic agent status: Secondary | ICD-10-CM | POA: Diagnosis not present

## 2018-02-03 DIAGNOSIS — Z741 Need for assistance with personal care: Secondary | ICD-10-CM | POA: Diagnosis not present

## 2018-02-03 DIAGNOSIS — Z880 Allergy status to penicillin: Secondary | ICD-10-CM | POA: Diagnosis not present

## 2018-02-03 DIAGNOSIS — I10 Essential (primary) hypertension: Secondary | ICD-10-CM | POA: Diagnosis present

## 2018-02-03 DIAGNOSIS — I82402 Acute embolism and thrombosis of unspecified deep veins of left lower extremity: Secondary | ICD-10-CM | POA: Diagnosis not present

## 2018-02-03 DIAGNOSIS — R279 Unspecified lack of coordination: Secondary | ICD-10-CM | POA: Diagnosis not present

## 2018-02-03 DIAGNOSIS — Z91041 Radiographic dye allergy status: Secondary | ICD-10-CM | POA: Diagnosis not present

## 2018-02-03 DIAGNOSIS — K219 Gastro-esophageal reflux disease without esophagitis: Secondary | ICD-10-CM | POA: Diagnosis not present

## 2018-02-03 DIAGNOSIS — R51 Headache: Secondary | ICD-10-CM | POA: Diagnosis not present

## 2018-02-03 DIAGNOSIS — G8114 Spastic hemiplegia affecting left nondominant side: Secondary | ICD-10-CM | POA: Diagnosis not present

## 2018-02-03 DIAGNOSIS — J45909 Unspecified asthma, uncomplicated: Secondary | ICD-10-CM | POA: Diagnosis not present

## 2018-02-03 DIAGNOSIS — M545 Low back pain: Secondary | ICD-10-CM | POA: Diagnosis not present

## 2018-02-03 DIAGNOSIS — M1389 Other specified arthritis, multiple sites: Secondary | ICD-10-CM | POA: Diagnosis not present

## 2018-02-03 DIAGNOSIS — R41841 Cognitive communication deficit: Secondary | ICD-10-CM | POA: Diagnosis not present

## 2018-02-03 DIAGNOSIS — L03116 Cellulitis of left lower limb: Secondary | ICD-10-CM | POA: Diagnosis not present

## 2018-02-03 DIAGNOSIS — F419 Anxiety disorder, unspecified: Secondary | ICD-10-CM | POA: Diagnosis not present

## 2018-02-03 DIAGNOSIS — E1140 Type 2 diabetes mellitus with diabetic neuropathy, unspecified: Secondary | ICD-10-CM | POA: Diagnosis not present

## 2018-02-03 DIAGNOSIS — G4089 Other seizures: Secondary | ICD-10-CM | POA: Diagnosis not present

## 2018-02-03 DIAGNOSIS — H9190 Unspecified hearing loss, unspecified ear: Secondary | ICD-10-CM | POA: Diagnosis present

## 2018-02-03 DIAGNOSIS — I639 Cerebral infarction, unspecified: Secondary | ICD-10-CM | POA: Diagnosis not present

## 2018-02-03 DIAGNOSIS — I739 Peripheral vascular disease, unspecified: Secondary | ICD-10-CM | POA: Diagnosis not present

## 2018-02-10 DIAGNOSIS — Z741 Need for assistance with personal care: Secondary | ICD-10-CM | POA: Diagnosis not present

## 2018-02-10 DIAGNOSIS — F69 Unspecified disorder of adult personality and behavior: Secondary | ICD-10-CM | POA: Diagnosis not present

## 2018-02-10 DIAGNOSIS — Z7902 Long term (current) use of antithrombotics/antiplatelets: Secondary | ICD-10-CM | POA: Diagnosis not present

## 2018-02-10 DIAGNOSIS — Z87891 Personal history of nicotine dependence: Secondary | ICD-10-CM | POA: Diagnosis not present

## 2018-02-10 DIAGNOSIS — N319 Neuromuscular dysfunction of bladder, unspecified: Secondary | ICD-10-CM | POA: Diagnosis not present

## 2018-02-10 DIAGNOSIS — F5105 Insomnia due to other mental disorder: Secondary | ICD-10-CM | POA: Diagnosis not present

## 2018-02-10 DIAGNOSIS — J45909 Unspecified asthma, uncomplicated: Secondary | ICD-10-CM | POA: Diagnosis not present

## 2018-02-10 DIAGNOSIS — J449 Chronic obstructive pulmonary disease, unspecified: Secondary | ICD-10-CM | POA: Diagnosis not present

## 2018-02-10 DIAGNOSIS — E785 Hyperlipidemia, unspecified: Secondary | ICD-10-CM | POA: Diagnosis not present

## 2018-02-10 DIAGNOSIS — R451 Restlessness and agitation: Secondary | ICD-10-CM | POA: Diagnosis not present

## 2018-02-10 DIAGNOSIS — R41841 Cognitive communication deficit: Secondary | ICD-10-CM | POA: Diagnosis not present

## 2018-02-10 DIAGNOSIS — M7989 Other specified soft tissue disorders: Secondary | ICD-10-CM | POA: Diagnosis not present

## 2018-02-10 DIAGNOSIS — Z046 Encounter for general psychiatric examination, requested by authority: Secondary | ICD-10-CM | POA: Diagnosis not present

## 2018-02-10 DIAGNOSIS — I69398 Other sequelae of cerebral infarction: Secondary | ICD-10-CM | POA: Diagnosis not present

## 2018-02-10 DIAGNOSIS — E11628 Type 2 diabetes mellitus with other skin complications: Secondary | ICD-10-CM | POA: Diagnosis not present

## 2018-02-10 DIAGNOSIS — R4182 Altered mental status, unspecified: Secondary | ICD-10-CM | POA: Diagnosis not present

## 2018-02-10 DIAGNOSIS — R488 Other symbolic dysfunctions: Secondary | ICD-10-CM | POA: Diagnosis not present

## 2018-02-10 DIAGNOSIS — Z79899 Other long term (current) drug therapy: Secondary | ICD-10-CM | POA: Diagnosis not present

## 2018-02-10 DIAGNOSIS — L0390 Cellulitis, unspecified: Secondary | ICD-10-CM | POA: Diagnosis not present

## 2018-02-10 DIAGNOSIS — G4700 Insomnia, unspecified: Secondary | ICD-10-CM | POA: Diagnosis not present

## 2018-02-10 DIAGNOSIS — I739 Peripheral vascular disease, unspecified: Secondary | ICD-10-CM | POA: Diagnosis not present

## 2018-02-10 DIAGNOSIS — E1140 Type 2 diabetes mellitus with diabetic neuropathy, unspecified: Secondary | ICD-10-CM | POA: Diagnosis not present

## 2018-02-10 DIAGNOSIS — E119 Type 2 diabetes mellitus without complications: Secondary | ICD-10-CM | POA: Diagnosis not present

## 2018-02-10 DIAGNOSIS — M545 Low back pain: Secondary | ICD-10-CM | POA: Diagnosis not present

## 2018-02-10 DIAGNOSIS — H9190 Unspecified hearing loss, unspecified ear: Secondary | ICD-10-CM | POA: Diagnosis not present

## 2018-02-10 DIAGNOSIS — G8114 Spastic hemiplegia affecting left nondominant side: Secondary | ICD-10-CM | POA: Diagnosis not present

## 2018-02-10 DIAGNOSIS — F323 Major depressive disorder, single episode, severe with psychotic features: Secondary | ICD-10-CM | POA: Diagnosis not present

## 2018-02-10 DIAGNOSIS — I69354 Hemiplegia and hemiparesis following cerebral infarction affecting left non-dominant side: Secondary | ICD-10-CM | POA: Diagnosis not present

## 2018-02-10 DIAGNOSIS — M1389 Other specified arthritis, multiple sites: Secondary | ICD-10-CM | POA: Diagnosis not present

## 2018-02-10 DIAGNOSIS — R456 Violent behavior: Secondary | ICD-10-CM | POA: Diagnosis not present

## 2018-02-10 DIAGNOSIS — M6281 Muscle weakness (generalized): Secondary | ICD-10-CM | POA: Diagnosis not present

## 2018-02-10 DIAGNOSIS — F29 Unspecified psychosis not due to a substance or known physiological condition: Secondary | ICD-10-CM | POA: Diagnosis not present

## 2018-02-10 DIAGNOSIS — R51 Headache: Secondary | ICD-10-CM | POA: Diagnosis not present

## 2018-02-10 DIAGNOSIS — I82402 Acute embolism and thrombosis of unspecified deep veins of left lower extremity: Secondary | ICD-10-CM | POA: Diagnosis not present

## 2018-02-10 DIAGNOSIS — R404 Transient alteration of awareness: Secondary | ICD-10-CM | POA: Diagnosis not present

## 2018-02-10 DIAGNOSIS — G4089 Other seizures: Secondary | ICD-10-CM | POA: Diagnosis not present

## 2018-02-10 DIAGNOSIS — I10 Essential (primary) hypertension: Secondary | ICD-10-CM | POA: Diagnosis not present

## 2018-02-10 DIAGNOSIS — B029 Zoster without complications: Secondary | ICD-10-CM | POA: Diagnosis not present

## 2018-02-10 DIAGNOSIS — L03116 Cellulitis of left lower limb: Secondary | ICD-10-CM | POA: Diagnosis not present

## 2018-02-10 DIAGNOSIS — R279 Unspecified lack of coordination: Secondary | ICD-10-CM | POA: Diagnosis not present

## 2018-02-10 DIAGNOSIS — F918 Other conduct disorders: Secondary | ICD-10-CM | POA: Diagnosis not present

## 2018-02-10 DIAGNOSIS — Z743 Need for continuous supervision: Secondary | ICD-10-CM | POA: Diagnosis not present

## 2018-02-10 DIAGNOSIS — R2689 Other abnormalities of gait and mobility: Secondary | ICD-10-CM | POA: Diagnosis not present

## 2018-02-10 DIAGNOSIS — K219 Gastro-esophageal reflux disease without esophagitis: Secondary | ICD-10-CM | POA: Diagnosis not present

## 2018-02-10 DIAGNOSIS — I6930 Unspecified sequelae of cerebral infarction: Secondary | ICD-10-CM | POA: Diagnosis not present

## 2018-02-10 DIAGNOSIS — F339 Major depressive disorder, recurrent, unspecified: Secondary | ICD-10-CM | POA: Diagnosis not present

## 2018-02-10 DIAGNOSIS — G40909 Epilepsy, unspecified, not intractable, without status epilepticus: Secondary | ICD-10-CM | POA: Diagnosis not present

## 2018-02-10 DIAGNOSIS — F411 Generalized anxiety disorder: Secondary | ICD-10-CM | POA: Diagnosis not present

## 2018-02-10 DIAGNOSIS — I639 Cerebral infarction, unspecified: Secondary | ICD-10-CM | POA: Diagnosis not present

## 2018-02-10 DIAGNOSIS — J45998 Other asthma: Secondary | ICD-10-CM | POA: Diagnosis not present

## 2018-02-10 DIAGNOSIS — F419 Anxiety disorder, unspecified: Secondary | ICD-10-CM | POA: Diagnosis not present

## 2018-02-10 DIAGNOSIS — R0902 Hypoxemia: Secondary | ICD-10-CM | POA: Diagnosis not present

## 2018-02-12 DIAGNOSIS — M6281 Muscle weakness (generalized): Secondary | ICD-10-CM | POA: Diagnosis not present

## 2018-02-12 DIAGNOSIS — L03116 Cellulitis of left lower limb: Secondary | ICD-10-CM | POA: Diagnosis not present

## 2018-02-12 DIAGNOSIS — M545 Low back pain: Secondary | ICD-10-CM | POA: Diagnosis not present

## 2018-02-12 DIAGNOSIS — F411 Generalized anxiety disorder: Secondary | ICD-10-CM | POA: Diagnosis not present

## 2018-02-12 DIAGNOSIS — G4089 Other seizures: Secondary | ICD-10-CM | POA: Diagnosis not present

## 2018-02-12 DIAGNOSIS — J45909 Unspecified asthma, uncomplicated: Secondary | ICD-10-CM | POA: Diagnosis not present

## 2018-02-12 DIAGNOSIS — I10 Essential (primary) hypertension: Secondary | ICD-10-CM | POA: Diagnosis not present

## 2018-02-12 DIAGNOSIS — F339 Major depressive disorder, recurrent, unspecified: Secondary | ICD-10-CM | POA: Diagnosis not present

## 2018-02-12 DIAGNOSIS — E1140 Type 2 diabetes mellitus with diabetic neuropathy, unspecified: Secondary | ICD-10-CM | POA: Diagnosis not present

## 2018-02-12 DIAGNOSIS — I6930 Unspecified sequelae of cerebral infarction: Secondary | ICD-10-CM | POA: Diagnosis not present

## 2018-02-12 DIAGNOSIS — G4700 Insomnia, unspecified: Secondary | ICD-10-CM | POA: Diagnosis not present

## 2018-02-15 DIAGNOSIS — M545 Low back pain: Secondary | ICD-10-CM | POA: Diagnosis not present

## 2018-02-15 DIAGNOSIS — F411 Generalized anxiety disorder: Secondary | ICD-10-CM | POA: Diagnosis not present

## 2018-02-15 DIAGNOSIS — E1140 Type 2 diabetes mellitus with diabetic neuropathy, unspecified: Secondary | ICD-10-CM | POA: Diagnosis not present

## 2018-02-15 DIAGNOSIS — I10 Essential (primary) hypertension: Secondary | ICD-10-CM | POA: Diagnosis not present

## 2018-02-15 DIAGNOSIS — G4700 Insomnia, unspecified: Secondary | ICD-10-CM | POA: Diagnosis not present

## 2018-02-15 DIAGNOSIS — M6281 Muscle weakness (generalized): Secondary | ICD-10-CM | POA: Diagnosis not present

## 2018-02-15 DIAGNOSIS — F339 Major depressive disorder, recurrent, unspecified: Secondary | ICD-10-CM | POA: Diagnosis not present

## 2018-02-15 DIAGNOSIS — G4089 Other seizures: Secondary | ICD-10-CM | POA: Diagnosis not present

## 2018-02-15 DIAGNOSIS — J45909 Unspecified asthma, uncomplicated: Secondary | ICD-10-CM | POA: Diagnosis not present

## 2018-02-15 DIAGNOSIS — B029 Zoster without complications: Secondary | ICD-10-CM | POA: Diagnosis not present

## 2018-02-15 DIAGNOSIS — I6930 Unspecified sequelae of cerebral infarction: Secondary | ICD-10-CM | POA: Diagnosis not present

## 2018-02-15 DIAGNOSIS — L03116 Cellulitis of left lower limb: Secondary | ICD-10-CM | POA: Diagnosis not present

## 2018-02-16 DIAGNOSIS — I6930 Unspecified sequelae of cerebral infarction: Secondary | ICD-10-CM | POA: Diagnosis not present

## 2018-02-16 DIAGNOSIS — M6281 Muscle weakness (generalized): Secondary | ICD-10-CM | POA: Diagnosis not present

## 2018-02-16 DIAGNOSIS — H9190 Unspecified hearing loss, unspecified ear: Secondary | ICD-10-CM | POA: Diagnosis not present

## 2018-02-16 DIAGNOSIS — L03116 Cellulitis of left lower limb: Secondary | ICD-10-CM | POA: Diagnosis not present

## 2018-02-16 DIAGNOSIS — F339 Major depressive disorder, recurrent, unspecified: Secondary | ICD-10-CM | POA: Diagnosis not present

## 2018-02-16 DIAGNOSIS — B029 Zoster without complications: Secondary | ICD-10-CM | POA: Diagnosis not present

## 2018-02-16 DIAGNOSIS — I10 Essential (primary) hypertension: Secondary | ICD-10-CM | POA: Diagnosis not present

## 2018-02-16 DIAGNOSIS — M545 Low back pain: Secondary | ICD-10-CM | POA: Diagnosis not present

## 2018-02-16 DIAGNOSIS — G4089 Other seizures: Secondary | ICD-10-CM | POA: Diagnosis not present

## 2018-02-16 DIAGNOSIS — E1140 Type 2 diabetes mellitus with diabetic neuropathy, unspecified: Secondary | ICD-10-CM | POA: Diagnosis not present

## 2018-02-16 DIAGNOSIS — I69354 Hemiplegia and hemiparesis following cerebral infarction affecting left non-dominant side: Secondary | ICD-10-CM | POA: Diagnosis not present

## 2018-02-16 DIAGNOSIS — J45909 Unspecified asthma, uncomplicated: Secondary | ICD-10-CM | POA: Diagnosis not present

## 2018-02-19 DIAGNOSIS — G4700 Insomnia, unspecified: Secondary | ICD-10-CM | POA: Diagnosis not present

## 2018-02-19 DIAGNOSIS — L03116 Cellulitis of left lower limb: Secondary | ICD-10-CM | POA: Diagnosis not present

## 2018-02-19 DIAGNOSIS — G4089 Other seizures: Secondary | ICD-10-CM | POA: Diagnosis not present

## 2018-02-19 DIAGNOSIS — J45909 Unspecified asthma, uncomplicated: Secondary | ICD-10-CM | POA: Diagnosis not present

## 2018-02-19 DIAGNOSIS — M545 Low back pain: Secondary | ICD-10-CM | POA: Diagnosis not present

## 2018-02-19 DIAGNOSIS — B029 Zoster without complications: Secondary | ICD-10-CM | POA: Diagnosis not present

## 2018-02-19 DIAGNOSIS — F339 Major depressive disorder, recurrent, unspecified: Secondary | ICD-10-CM | POA: Diagnosis not present

## 2018-02-19 DIAGNOSIS — I10 Essential (primary) hypertension: Secondary | ICD-10-CM | POA: Diagnosis not present

## 2018-02-19 DIAGNOSIS — M6281 Muscle weakness (generalized): Secondary | ICD-10-CM | POA: Diagnosis not present

## 2018-02-19 DIAGNOSIS — F411 Generalized anxiety disorder: Secondary | ICD-10-CM | POA: Diagnosis not present

## 2018-02-19 DIAGNOSIS — E1140 Type 2 diabetes mellitus with diabetic neuropathy, unspecified: Secondary | ICD-10-CM | POA: Diagnosis not present

## 2018-02-19 DIAGNOSIS — I6930 Unspecified sequelae of cerebral infarction: Secondary | ICD-10-CM | POA: Diagnosis not present

## 2018-02-21 DIAGNOSIS — I6930 Unspecified sequelae of cerebral infarction: Secondary | ICD-10-CM | POA: Diagnosis not present

## 2018-02-21 DIAGNOSIS — I10 Essential (primary) hypertension: Secondary | ICD-10-CM | POA: Diagnosis not present

## 2018-02-21 DIAGNOSIS — F339 Major depressive disorder, recurrent, unspecified: Secondary | ICD-10-CM | POA: Diagnosis not present

## 2018-02-21 DIAGNOSIS — J45909 Unspecified asthma, uncomplicated: Secondary | ICD-10-CM | POA: Diagnosis not present

## 2018-02-21 DIAGNOSIS — L03116 Cellulitis of left lower limb: Secondary | ICD-10-CM | POA: Diagnosis not present

## 2018-02-21 DIAGNOSIS — M6281 Muscle weakness (generalized): Secondary | ICD-10-CM | POA: Diagnosis not present

## 2018-02-21 DIAGNOSIS — B029 Zoster without complications: Secondary | ICD-10-CM | POA: Diagnosis not present

## 2018-02-21 DIAGNOSIS — G4089 Other seizures: Secondary | ICD-10-CM | POA: Diagnosis not present

## 2018-02-21 DIAGNOSIS — M545 Low back pain: Secondary | ICD-10-CM | POA: Diagnosis not present

## 2018-02-21 DIAGNOSIS — E1140 Type 2 diabetes mellitus with diabetic neuropathy, unspecified: Secondary | ICD-10-CM | POA: Diagnosis not present

## 2018-02-21 DIAGNOSIS — E785 Hyperlipidemia, unspecified: Secondary | ICD-10-CM | POA: Diagnosis not present

## 2018-02-21 DIAGNOSIS — F411 Generalized anxiety disorder: Secondary | ICD-10-CM | POA: Diagnosis not present

## 2018-02-23 DIAGNOSIS — I10 Essential (primary) hypertension: Secondary | ICD-10-CM | POA: Diagnosis not present

## 2018-02-23 DIAGNOSIS — J45909 Unspecified asthma, uncomplicated: Secondary | ICD-10-CM | POA: Diagnosis not present

## 2018-02-23 DIAGNOSIS — F339 Major depressive disorder, recurrent, unspecified: Secondary | ICD-10-CM | POA: Diagnosis not present

## 2018-02-23 DIAGNOSIS — I6930 Unspecified sequelae of cerebral infarction: Secondary | ICD-10-CM | POA: Diagnosis not present

## 2018-02-23 DIAGNOSIS — F411 Generalized anxiety disorder: Secondary | ICD-10-CM | POA: Diagnosis not present

## 2018-02-23 DIAGNOSIS — M545 Low back pain: Secondary | ICD-10-CM | POA: Diagnosis not present

## 2018-02-23 DIAGNOSIS — G4089 Other seizures: Secondary | ICD-10-CM | POA: Diagnosis not present

## 2018-02-23 DIAGNOSIS — G4700 Insomnia, unspecified: Secondary | ICD-10-CM | POA: Diagnosis not present

## 2018-02-23 DIAGNOSIS — L03116 Cellulitis of left lower limb: Secondary | ICD-10-CM | POA: Diagnosis not present

## 2018-02-23 DIAGNOSIS — B029 Zoster without complications: Secondary | ICD-10-CM | POA: Diagnosis not present

## 2018-02-23 DIAGNOSIS — M6281 Muscle weakness (generalized): Secondary | ICD-10-CM | POA: Diagnosis not present

## 2018-02-23 DIAGNOSIS — E1140 Type 2 diabetes mellitus with diabetic neuropathy, unspecified: Secondary | ICD-10-CM | POA: Diagnosis not present

## 2018-02-25 ENCOUNTER — Emergency Department: Payer: Medicare Other

## 2018-02-25 ENCOUNTER — Emergency Department
Admission: EM | Admit: 2018-02-25 | Discharge: 2018-02-25 | Disposition: A | Discharge status: Skilled Nursing Facility | Payer: Medicare Other | Attending: Emergency Medicine | Admitting: Emergency Medicine

## 2018-02-25 ENCOUNTER — Other Ambulatory Visit: Payer: Self-pay

## 2018-02-25 DIAGNOSIS — Z87891 Personal history of nicotine dependence: Secondary | ICD-10-CM | POA: Insufficient documentation

## 2018-02-25 DIAGNOSIS — R4182 Altered mental status, unspecified: Secondary | ICD-10-CM | POA: Diagnosis not present

## 2018-02-25 DIAGNOSIS — R451 Restlessness and agitation: Secondary | ICD-10-CM | POA: Insufficient documentation

## 2018-02-25 DIAGNOSIS — I10 Essential (primary) hypertension: Secondary | ICD-10-CM | POA: Insufficient documentation

## 2018-02-25 DIAGNOSIS — E119 Type 2 diabetes mellitus without complications: Secondary | ICD-10-CM | POA: Diagnosis not present

## 2018-02-25 DIAGNOSIS — Z7902 Long term (current) use of antithrombotics/antiplatelets: Secondary | ICD-10-CM | POA: Insufficient documentation

## 2018-02-25 DIAGNOSIS — Z79899 Other long term (current) drug therapy: Secondary | ICD-10-CM | POA: Insufficient documentation

## 2018-02-25 DIAGNOSIS — J449 Chronic obstructive pulmonary disease, unspecified: Secondary | ICD-10-CM | POA: Diagnosis not present

## 2018-02-25 DIAGNOSIS — F918 Other conduct disorders: Secondary | ICD-10-CM | POA: Diagnosis not present

## 2018-02-25 HISTORY — DX: Zoster without complications: B02.9

## 2018-02-25 LAB — URINALYSIS, COMPLETE (UACMP) WITH MICROSCOPIC
BACTERIA UA: NONE SEEN
Bilirubin Urine: NEGATIVE
Glucose, UA: 50 mg/dL — AB
HGB URINE DIPSTICK: NEGATIVE
KETONES UR: NEGATIVE mg/dL
Leukocytes, UA: NEGATIVE
NITRITE: NEGATIVE
PROTEIN: NEGATIVE mg/dL
Specific Gravity, Urine: 1.011 (ref 1.005–1.030)
pH: 5 (ref 5.0–8.0)

## 2018-02-25 LAB — CBC
HEMATOCRIT: 39.6 % (ref 39.0–52.0)
HEMOGLOBIN: 13.8 g/dL (ref 13.0–17.0)
MCH: 30.2 pg (ref 26.0–34.0)
MCHC: 34.8 g/dL (ref 30.0–36.0)
MCV: 86.7 fL (ref 80.0–100.0)
Platelets: 133 10*3/uL — ABNORMAL LOW (ref 150–400)
RBC: 4.57 MIL/uL (ref 4.22–5.81)
RDW: 11.8 % (ref 11.5–15.5)
WBC: 4.5 10*3/uL (ref 4.0–10.5)
nRBC: 0 % (ref 0.0–0.2)

## 2018-02-25 LAB — COMPREHENSIVE METABOLIC PANEL
ALK PHOS: 85 U/L (ref 38–126)
ALT: 28 U/L (ref 0–44)
ANION GAP: 8 (ref 5–15)
AST: 25 U/L (ref 15–41)
Albumin: 4.3 g/dL (ref 3.5–5.0)
BILIRUBIN TOTAL: 0.6 mg/dL (ref 0.3–1.2)
BUN: 13 mg/dL (ref 8–23)
CO2: 30 mmol/L (ref 22–32)
CREATININE: 1.11 mg/dL (ref 0.61–1.24)
Calcium: 8.9 mg/dL (ref 8.9–10.3)
Chloride: 98 mmol/L (ref 98–111)
GFR calc non Af Amer: >60 mL/min (ref >60)
GLUCOSE: 133 mg/dL — AB (ref 70–99)
Potassium: 3.4 mmol/L — ABNORMAL LOW (ref 3.5–5.1)
Sodium: 136 mmol/L (ref 135–145)
Total Protein: 7.3 g/dL (ref 6.5–8.1)

## 2018-02-25 LAB — TROPONIN I: Troponin I: <0.03 ng/mL (ref <0.03)

## 2018-02-25 NOTE — ED Notes (Signed)
Attempted to contact [facility] x2 without success

## 2018-02-25 NOTE — ED Provider Notes (Signed)
[facility] Emergency Department Provider Note  Time seen: 1:32 PM  I have reviewed the triage vital signs and the nursing notes.   HISTORY  Chief Complaint Aggressive Behavior    HPI LORCAN SHELP is a 63 y.o. male with a past medical history of hypertension, gastric reflux, hyperlipidemia, seizure disorder, diabetes, he is deaf but can read lips, CVA with left-sided deficits presents to the emergency department for agitation/aggression.  According to EMS report patient is currently at the [facility], while there he had aggressive/agitated behavior.  Required 5 mg of Valium.  EMS states upon their arrival the patient is acting normal, no acute issues, no agitation or aggression.  Upon arrival to the emergency department the patient continues to be calm and cooperative.  He is deaf but can read lips very well and answer questions appropriately.  Denies any fever, cough or congestion.  Denies any chest pain or abdominal pain.   Past Medical History:  Diagnosis Date  . Anemia   . Asthma   . Deaf   . Essential hypertension   . GERD (gastroesophageal reflux disease)   . History of migraine   . History of stroke    Spastic hemiplegia, right MCA stroke April 2014 in [place]  . Mixed hyperlipidemia   . Other pancytopenia ([place]) 12/23/2015  . PAD (peripheral artery disease) ([place])   . Sarcoidosis   . Seizures ([place])   . Shingles   . Stroke ([place])   . Type 2 diabetes mellitus [facility])     Patient Active Problem List   Diagnosis Date Noted  . 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 Nonintractable headache 06/27/2017  . Mixed hyperlipidemia 05/17/2017  . Uncontrolled type 2 diabetes mellitus with hyperglycemia ([place]) 05/16/2017  . Thrombocytopenia ([place]) 09/13/2016  . Hypoglycemia   . Syncope 09/12/2016  . Seizure disorder as sequela of cerebrovascular accident ([place])  09/12/2016  . MDD (major depressive disorder), recurrent episode, severe ([place]) 12/30/2015  . Suicidal ideation   . Other pancytopenia ([place]) 12/23/2015  . Loss of weight 11/12/2015  . 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 Iron deficiency anemia 09/22/2015  . Spinal stenosis in cervical region 08/21/2015  . Neck pain 08/12/2015  . Low back pain 08/12/2015  . Abnormality of gait 08/12/2015  . Neuropathy of right upper extremity 06/29/2015  . Acute rhinitis 05/05/2015  . Abdominal pain, epigastric 04/23/2015  . Arm pain, right 04/23/2015  . Atypical chest pain 07/23/2013  . 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 Dyspepsia 06/13/2013  . Dysphagia 06/13/2013  . Headache 11/30/2012  . Solitary pulmonary nodule 11/20/2012  . Spastic hemiplegia affecting left nondominant side ([place]) 11/08/2012  . Essential hypertension, benign 10/12/2012  . COPD GOLD II with reversibility  10/12/2012  . 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 CVA (cerebral vascular accident) ([place]) 10/03/2012  . Diabetes ([place]) 10/03/2012  . Sarcoidosis ([place]) 10/03/2012    Past Surgical History:  Procedure Laterality Date  . BRAVO PH STUDY N/A 06/18/2013   pH STUDY SHOWS NEXIUM TWICE DAILY CONTROLS THE ACID IN HIS STOMACH. HE HAD VERY FEWEPISODE OF REGURGITATION RECORDED IN THE 2 DAYS THE STUDY WAS PERFORMED.   [name] COLONOSCOPY  2011   [place]  . ESOPHAGOGASTRODUODENOSCOPY N/A 06/18/2013   Dr.Fields- probable proximal esophageal web,dilation performed, bravo cap placed, mild non-erosive gastritis in the gastric antrum and on the greater curvature of the gastric body bx- granulomatous gastritis, duodenal mucosa showed  no abnormalities in the bulb and second portion of the duodenum.   . ESOPHAGOGASTRODUODENOSCOPY N/A 05/05/2015   Procedure: ESOPHAGOGASTRODUODENOSCOPY (EGD);  Surgeon: Danie Binder, MD;  [facility];  Service: Endoscopy;  Laterality: N/A;  730  . MALONEY DILATION N/A 06/18/2013   Procedure:  MALONEY DILATION;  Surgeon: Danie Binder, MD;  [facility];  Service: Endoscopy;  Laterality: N/A;  . OTHER SURGICAL HISTORY     cyst removal head and chest  . POSTERIOR CERVICAL FUSION/FORAMINOTOMY N/A 10/07/2015   Procedure: Cervical Three-Four, Cervical Four-Five, Cervical Five-Six, Cervical Six-Seven Posterior Cervical Laminectomy and Fusion;  Surgeon: Kary Kos, MD;  Location: [place] [facility];  Service: Neurosurgery;  Laterality: N/A;  . SAVORY DILATION N/A 06/18/2013   Procedure: SAVORY DILATION;  Surgeon: Danie Binder, MD;  [facility];  Service: Endoscopy;  Laterality: N/A;  . TEE WITHOUT CARDIOVERSION N/A 10/04/2012   Procedure: TRANSESOPHAGEAL ECHOCARDIOGRAM (TEE);  Surgeon: Thayer Headings, MD;  Location: [facility];  Service: Cardiovascular;  Laterality: N/A;  . [place] accident  Both legs fractured and repaired surgically    Prior to Admission medications   Medication Sig Start Date End Date Taking? Authorizing Provider  albuterol (PROAIR HFA) 108 (90 Base) MCG/ACT inhaler Inhale 2 puffs into the lungs every 4 (four) hours as needed for wheezing or shortness of breath.     [provider]  alum & mag hydroxide-simeth (GERI-LANTA) 200-200-20 MG/5ML suspension Take 30 mLs by mouth every 4 (four) hours as needed for indigestion or heartburn.    [provider]  atorvastatin (LIPITOR) 10 MG tablet Take 10 mg by mouth daily. (cholesterol)    [provider]  cephALEXin (KEFLEX) 500 MG capsule Take 1 capsule (500 mg total) by mouth 3 (three) times daily. 01/12/18   Harvest Dark, MD  cholecalciferol (VITAMIN D) 1000 units tablet Take 1,000 Units by mouth daily.    [provider]  Cholecalciferol (VITAMIN D3) 1000 units CAPS Take 1 capsule (1,000 Units total) by mouth daily. 08/30/17   Marcial Pacas, MD  clopidogrel (PLAVIX) 75 MG tablet Take 75 mg by mouth daily.    [provider]   diazepam (VALIUM) 2 MG tablet Take 1 tablet (2 mg total) by mouth every 8 (eight) hours as needed for muscle spasms. 09/08/17 09/08/18  Eula Listen, MD  diclofenac sodium (VOLTAREN) 1 % GEL Apply 1 g topically 3 (three) times daily as needed (pain).    [provider]  escitalopram (LEXAPRO) 10 MG tablet Take 1 tablet (10 mg total) by mouth daily. Patient taking differently: Take 20 mg by mouth daily.  06/27/17   Marcial Pacas, MD  gabapentin (NEURONTIN) 300 MG capsule Take 300 mg by mouth 3 (three) times daily.    [provider]  glucose blood (ONETOUCH VERIO) test strip 1 each by Other route daily. And lancets 1/day 06/03/16   Renato Shin, MD  ibuprofen (ADVIL,MOTRIN) 600 MG tablet Take 1 tablet (600 mg total) by mouth every 8 (eight) hours as needed. 11/18/17   Nance Pear, MD  IncobotulinumtoxinA ([name] IM) Inject 500 Units into the muscle every 3 (three) months.    [provider]  lamoTRIgine (LAMICTAL) 100 MG tablet Take 1 tablet (100 mg total) by mouth 2 (two) times daily. 08/30/17   Marcial Pacas, MD  losartan (COZAAR) 25 MG tablet Take 25 mg by mouth daily.     [provider]  tamsulosin (FLOMAX) 0.4 MG CAPS capsule Take 0.4 mg by mouth at bedtime.    [provider]  traMADol (ULTRAM) 50 MG tablet Take 1 tablet (50 mg total) by mouth every 6 (six) hours as needed. 03/04/17   Maczis, Barth Kirks, PA-C  traZODone (DESYREL) 50 MG tablet Take 50 mg by mouth at bedtime.    [provider]    Allergies  Allergen Reactions  . Hydrocodone Other (See Comments)    Makes "looney"  . Shellfish Allergy Hives and Swelling  . Metformin Diarrhea  . Contrast Media [Iodinated Diagnostic Agents] Itching  . Oxycodone Nausea And Vomiting    Family History  Problem Relation Age of Onset  . Diabetes Mother   . Hypertension Mother   . Diabetes Father   . Hypertension Father   . Colon polyps Neg Hx   . Colon cancer Neg Hx     Social  History Social History   Tobacco Use  . Smoking status: Former Smoker    Packs/day: 1.00    Years: 29.00    Pack years: 29.00    Types: Cigarettes    Last attempt to quit: 02/22/1992    Years since quitting: 26.0  . Smokeless tobacco: Never Used  Substance Use Topics  . Alcohol use: Yes    Alcohol/week: 1.0 - 2.0 standard drinks    Types: 1 - 2 Cans of beer per week    Comment: occasionally, every 2-3 days  . Drug use: Yes    Types: Marijuana, "Crack" cocaine    Comment: hx of crack cocaine "long time ago" per pt - hard to tell how long it is has been since he used    Review of Systems Constitutional: Negative for fever. Cardiovascular: Negative for chest pain. Respiratory: Negative for shortness of breath. Gastrointestinal: Negative for abdominal pain Genitourinary: Negative for urinary compaints Musculoskeletal: Negative for musculoskeletal complaints Neurological: Negative for headache All other ROS negative  ____________________________________________   PHYSICAL EXAM:  VITAL SIGNS: ED Triage Vitals  Enc Vitals Group     BP --      Pulse Rate 02/25/18 1331 85     Resp 02/25/18 1331 14     Temp 02/25/18 1331 98 F (36.7 C)     Temp Source 02/25/18 1331 Oral     SpO2 02/25/18 1331 97 %     Weight 02/25/18 1323 180 lb (81.6 kg)     Height 02/25/18 1323 5\' 8"  (1.727 m)     Head Circumference --      Peak Flow --      Pain Score 02/25/18 1323 0     Pain Loc --      Pain Edu? --      Excl. in [place]? --     Constitutional: Alert, answering questions appropriately, calm and cooperative. Eyes: Normal exam ENT   Head: Normocephalic and atraumatic.   Mouth/Throat: Mucous membranes are moist. Cardiovascular: Normal rate, regular rhythm.  Respiratory: Normal respiratory effort without tachypnea nor retractions. Breath sounds are clear  Gastrointestinal: Soft and nontender. No distention.   Musculoskeletal: Nontender with normal range of motion in all  extremities.  Neurologic: Able to answer yes/no questions, deaf but is able to read my lips very well and answer questions appropriately.  Left-sided deficits from prior CVA. Skin:  Skin is warm, dry and intact.  Psychiatric: Mood and affect are normal.   ____________________________________________   RADIOLOGY  Chest x-ray negative  ____________________________________________   INITIAL IMPRESSION / ASSESSMENT  AND PLAN / ED COURSE  Pertinent labs & imaging results that were available during my care of the patient were reviewed by me and considered in my medical decision making (see chart for details).  Patient presents to the emergency department for aggressive behavior/agitation.  Currently the patient appears well but did receive 5 mg of Valium prior to arrival.  We will check labs, chest x-ray and urinalysis as a precaution.  I reviewed the patient's chart he does have a listed history of adjustment disorder with mixed emotional and conduct behavioral disturbance, could likely be exacerbation of adjustment disorder.  Overall the patient is calm cooperative and pleasant at this time.  Patient continues to be calm and cooperative, no distress.  Labs are within normal limits, urinalysis is negative, chest x-ray is negative.  No acute medical concerns at this time.  We will discharge back to the patient's care facility.  ____________________________________________   FINAL CLINICAL IMPRESSION(S) / ED DIAGNOSES  Agitation   Harvest Dark, MD 02/25/18 1422

## 2018-02-25 NOTE — ED Triage Notes (Signed)
[facility] "[facility]gressive/bizarre behavior" - pt received Valium 5mg  prior to EMS arrival and has required no further intervention - pt is DEAF but can read lips

## 2018-02-26 DIAGNOSIS — G4700 Insomnia, unspecified: Secondary | ICD-10-CM | POA: Diagnosis not present

## 2018-02-26 DIAGNOSIS — M6281 Muscle weakness (generalized): Secondary | ICD-10-CM | POA: Diagnosis not present

## 2018-02-26 DIAGNOSIS — M545 Low back pain: Secondary | ICD-10-CM | POA: Diagnosis not present

## 2018-02-26 DIAGNOSIS — I6930 Unspecified sequelae of cerebral infarction: Secondary | ICD-10-CM | POA: Diagnosis not present

## 2018-02-26 DIAGNOSIS — E1140 Type 2 diabetes mellitus with diabetic neuropathy, unspecified: Secondary | ICD-10-CM | POA: Diagnosis not present

## 2018-02-26 DIAGNOSIS — I10 Essential (primary) hypertension: Secondary | ICD-10-CM | POA: Diagnosis not present

## 2018-02-26 DIAGNOSIS — B029 Zoster without complications: Secondary | ICD-10-CM | POA: Diagnosis not present

## 2018-02-26 DIAGNOSIS — J45909 Unspecified asthma, uncomplicated: Secondary | ICD-10-CM | POA: Diagnosis not present

## 2018-02-26 DIAGNOSIS — F339 Major depressive disorder, recurrent, unspecified: Secondary | ICD-10-CM | POA: Diagnosis not present

## 2018-02-26 DIAGNOSIS — G4089 Other seizures: Secondary | ICD-10-CM | POA: Diagnosis not present

## 2018-02-26 DIAGNOSIS — L03116 Cellulitis of left lower limb: Secondary | ICD-10-CM | POA: Diagnosis not present

## 2018-02-26 DIAGNOSIS — H9190 Unspecified hearing loss, unspecified ear: Secondary | ICD-10-CM | POA: Diagnosis not present

## 2018-02-28 DIAGNOSIS — E1140 Type 2 diabetes mellitus with diabetic neuropathy, unspecified: Secondary | ICD-10-CM | POA: Diagnosis not present

## 2018-02-28 DIAGNOSIS — J45909 Unspecified asthma, uncomplicated: Secondary | ICD-10-CM | POA: Diagnosis not present

## 2018-02-28 DIAGNOSIS — M545 Low back pain: Secondary | ICD-10-CM | POA: Diagnosis not present

## 2018-02-28 DIAGNOSIS — I10 Essential (primary) hypertension: Secondary | ICD-10-CM | POA: Diagnosis not present

## 2018-02-28 DIAGNOSIS — H9190 Unspecified hearing loss, unspecified ear: Secondary | ICD-10-CM | POA: Diagnosis not present

## 2018-02-28 DIAGNOSIS — F339 Major depressive disorder, recurrent, unspecified: Secondary | ICD-10-CM | POA: Diagnosis not present

## 2018-02-28 DIAGNOSIS — G4700 Insomnia, unspecified: Secondary | ICD-10-CM | POA: Diagnosis not present

## 2018-02-28 DIAGNOSIS — L03116 Cellulitis of left lower limb: Secondary | ICD-10-CM | POA: Diagnosis not present

## 2018-02-28 DIAGNOSIS — I6930 Unspecified sequelae of cerebral infarction: Secondary | ICD-10-CM | POA: Diagnosis not present

## 2018-02-28 DIAGNOSIS — B029 Zoster without complications: Secondary | ICD-10-CM | POA: Diagnosis not present

## 2018-02-28 DIAGNOSIS — M6281 Muscle weakness (generalized): Secondary | ICD-10-CM | POA: Diagnosis not present

## 2018-02-28 DIAGNOSIS — G4089 Other seizures: Secondary | ICD-10-CM | POA: Diagnosis not present

## 2018-03-01 ENCOUNTER — Ambulatory Visit (INDEPENDENT_AMBULATORY_CARE_PROVIDER_SITE_OTHER): Payer: Medicare Other | Admitting: Neurology

## 2018-03-01 ENCOUNTER — Encounter: Payer: Self-pay | Admitting: Neurology

## 2018-03-01 ENCOUNTER — Other Ambulatory Visit: Payer: Self-pay

## 2018-03-01 VITALS — BP 105/68 | HR 80 | Resp 18 | Ht 68.0 in

## 2018-03-01 DIAGNOSIS — G40909 Epilepsy, unspecified, not intractable, without status epilepticus: Secondary | ICD-10-CM

## 2018-03-01 DIAGNOSIS — I69398 Other sequelae of cerebral infarction: Secondary | ICD-10-CM | POA: Diagnosis not present

## 2018-03-01 DIAGNOSIS — Z79899 Other long term (current) drug therapy: Secondary | ICD-10-CM

## 2018-03-01 DIAGNOSIS — I639 Cerebral infarction, unspecified: Secondary | ICD-10-CM | POA: Diagnosis not present

## 2018-03-01 DIAGNOSIS — G8114 Spastic hemiplegia affecting left nondominant side: Secondary | ICD-10-CM

## 2018-03-01 MED ORDER — LAMOTRIGINE 100 MG PO TABS: ORAL_TABLET | ORAL | 11 refills | Status: DC

## 2018-03-01 NOTE — Patient Instructions (Addendum)
Patient was on lamotrigine 100mg  bid for seizure disorder, per record, he has worsening mood disorder with keppra in the past.  1. Will switch keppra to lamotrogine.   Stop keppra 500mg  bid  Restart Lamotrigine 100mg , one in the morning/2 tabs at night, if lamotrigine was stopped less than 3 days ago, per record, he was taking lamotrigine 100mg  bid (instead of keppra) for seizure control.  If he has stopped lamotrigine more than 3 days ago, please call back [phone] for titration instruction 2. Stop gabapentin 3. Lab evaluation today, checking lamotrigine, keppra, b12 level.

## 2018-03-01 NOTE — Progress Notes (Signed)
**  [name] 100 units x 5 vials, NDC 0259-1610-01, Lot [id number], Exp 05/2020, office supply.//mck,rn**
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
## 2018-03-02 DIAGNOSIS — G8114 Spastic hemiplegia affecting left nondominant side: Secondary | ICD-10-CM

## 2018-03-02 DIAGNOSIS — I69398 Other sequelae of cerebral infarction: Secondary | ICD-10-CM | POA: Diagnosis not present

## 2018-03-02 DIAGNOSIS — Z79899 Other long term (current) drug therapy: Secondary | ICD-10-CM | POA: Diagnosis not present

## 2018-03-02 DIAGNOSIS — G40909 Epilepsy, unspecified, not intractable, without status epilepticus: Secondary | ICD-10-CM | POA: Diagnosis not present

## 2018-03-02 DIAGNOSIS — I639 Cerebral infarction, unspecified: Secondary | ICD-10-CM | POA: Diagnosis not present

## 2018-03-02 MED ORDER — INCOBOTULINUMTOXINA 100 UNITS IM SOLR
500.0000 [IU] | INTRAMUSCULAR | Status: DC
  Administered 2018-03-02: 500 [IU] via INTRAMUSCULAR

## 2018-03-05 ENCOUNTER — Telehealth: Payer: Self-pay | Admitting: *Deleted

## 2018-03-05 DIAGNOSIS — F332 Major depressive disorder, recurrent severe without psychotic features: Secondary | ICD-10-CM | POA: Diagnosis not present

## 2018-03-05 LAB — LAMOTRIGINE LEVEL: Lamotrigine Lvl: NOT DETECTED ug/mL (ref 2.0–20.0)

## 2018-03-05 LAB — LEVETIRACETAM LEVEL: Levetiracetam Lvl: 17.9 ug/mL (ref 10.0–40.0)

## 2018-03-05 LAB — VITAMIN B12: VITAMIN B 12: 534 pg/mL (ref 232–1245)

## 2018-03-05 NOTE — Telephone Encounter (Signed)
I called and spoke to [place] at [facility] where the patient resides.  She is aware of his lab results.

## 2018-03-05 NOTE — Telephone Encounter (Signed)
-----   Message from Marcial Pacas, MD sent at 03/05/2018  5:12 PM EST ----- Please call patient for normal laboratory result

## 2018-03-08 ENCOUNTER — Telehealth: Payer: Self-pay | Admitting: Neurology

## 2018-03-08 DIAGNOSIS — I639 Cerebral infarction, unspecified: Secondary | ICD-10-CM

## 2018-03-08 NOTE — Telephone Encounter (Signed)
MRI brain was placed, make sure he is taking lamotrigine not keppra.  If he continues to have increased agitation, may consider further medication changes.  Make sure he does not have seizure.

## 2018-03-08 NOTE — Telephone Encounter (Signed)
Returned call to [employer].  He is aware to expect a call for MRI scheduling.  He is unsure of the patient's medications.  I called Charles [name] and spoke to the director, Charles [name].  Her direct number is [phone].  She confirmed the patient is no longer on Keppra and he is now being given Lamictal 100mg  in am and 200mg  in pm.   Charles [name] says this reported behavior is not new for the patient.  She stated he has a long history of anger issues and outburst.  He is currently under the care of psychiatry and she has placed a call to request an appointment for him.  Says he is treated by [facility].
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
## 2018-03-08 NOTE — Addendum Note (Signed)
Addended by: Marcial Pacas on: 03/08/2018 04:36 PM   Modules accepted: Orders

## 2018-03-12 ENCOUNTER — Telehealth: Payer: Self-pay | Admitting: Neurology

## 2018-03-12 NOTE — Telephone Encounter (Signed)
Medicare/medicaid order sent to [facility]. They will reach out to the pt to schedule.  °

## 2018-03-12 NOTE — Telephone Encounter (Signed)
Spoke to brother [name] on dpr he is aware

## 2018-03-15 ENCOUNTER — Other Ambulatory Visit: Payer: Self-pay | Admitting: Neurosurgery

## 2018-03-15 DIAGNOSIS — M5416 Radiculopathy, lumbar region: Secondary | ICD-10-CM

## 2018-03-16 DIAGNOSIS — H25813 Combined forms of age-related cataract, bilateral: Secondary | ICD-10-CM | POA: Diagnosis not present

## 2018-03-19 ENCOUNTER — Other Ambulatory Visit: Payer: Medicare Other

## 2018-03-22 ENCOUNTER — Ambulatory Visit
Admission: RE | Admit: 2018-03-22 | Discharge: 2018-03-22 | Disposition: A | Discharge status: Home / Self Care | Payer: Medicare Other | Source: Ambulatory Visit | Attending: Neurology | Admitting: Neurology

## 2018-03-22 ENCOUNTER — Ambulatory Visit
Admission: RE | Admit: 2018-03-22 | Discharge: 2018-03-22 | Disposition: A | Discharge status: Home / Self Care | Payer: Medicare Other | Source: Ambulatory Visit | Attending: Neurosurgery | Admitting: Neurosurgery

## 2018-03-22 DIAGNOSIS — M48061 Spinal stenosis, lumbar region without neurogenic claudication: Secondary | ICD-10-CM | POA: Diagnosis not present

## 2018-03-22 DIAGNOSIS — I639 Cerebral infarction, unspecified: Secondary | ICD-10-CM | POA: Diagnosis not present

## 2018-03-22 DIAGNOSIS — M5416 Radiculopathy, lumbar region: Secondary | ICD-10-CM

## 2018-03-23 ENCOUNTER — Telehealth: Payer: Self-pay | Admitting: Neurology

## 2018-03-23 NOTE — Telephone Encounter (Signed)
Please call patient, MRI brain showed large right MCA stroke, overall on significant change compared to previous scan in Oct 2018   IMPRESSION: Abnormal MRI scan of the brain showing large remote age right middle cerebral artery infarct with encephalomalacia and gliosis and wallerian degeneration with compensatory dilatation of the right lateral ventricle.  There are mild changes of chronic microvascular ischemia.  Diminished flow voids of the posterior circulation and absent flow void in the right middle cerebral artery.  Overall no significant change compared with MRI scan dated 12/15/2016.  No acute findings are noted

## 2018-03-23 NOTE — Telephone Encounter (Signed)
No answer at [phone]. No message left since this # is not on pt's DPR. Phone# on DPR [phone] no longer in service. No answer at [facility] phone, Vastina's phone (only 2 people on DPR).Hilton Cork

## 2018-03-26 NOTE — Telephone Encounter (Signed)
I spoke to his brother, Charles [name] (on [place]).  He is aware of his MRI results and verbalized understanding.  The patient's mother, Charles [name], has passed away.  The patient now resides at [facility] in [place] (ph: [phone], fax: [phone]).  I have also spoken with the nursing home director, Elvis Coil, who will provide a printed copy of the results the physician who is responsible for his care at the facility.

## 2018-04-05 DIAGNOSIS — M5440 Lumbago with sciatica, unspecified side: Secondary | ICD-10-CM | POA: Diagnosis not present

## 2018-04-09 ENCOUNTER — Other Ambulatory Visit: Payer: Self-pay | Admitting: Neurosurgery

## 2018-04-09 DIAGNOSIS — M5440 Lumbago with sciatica, unspecified side: Secondary | ICD-10-CM

## 2018-04-17 DIAGNOSIS — F332 Major depressive disorder, recurrent severe without psychotic features: Secondary | ICD-10-CM | POA: Diagnosis not present

## 2018-04-23 ENCOUNTER — Ambulatory Visit: Payer: Medicare Other | Admitting: Podiatry

## 2018-04-23 DIAGNOSIS — F332 Major depressive disorder, recurrent severe without psychotic features: Secondary | ICD-10-CM | POA: Diagnosis not present

## 2018-04-26 DIAGNOSIS — M79674 Pain in right toe(s): Secondary | ICD-10-CM | POA: Diagnosis not present

## 2018-04-26 DIAGNOSIS — B351 Tinea unguium: Secondary | ICD-10-CM | POA: Diagnosis not present

## 2018-04-26 DIAGNOSIS — M79675 Pain in left toe(s): Secondary | ICD-10-CM | POA: Diagnosis not present

## 2018-04-30 DIAGNOSIS — F332 Major depressive disorder, recurrent severe without psychotic features: Secondary | ICD-10-CM | POA: Diagnosis not present

## 2018-05-01 ENCOUNTER — Other Ambulatory Visit: Payer: Medicare Other

## 2018-05-01 ENCOUNTER — Ambulatory Visit
Admission: RE | Admit: 2018-05-01 | Discharge: 2018-05-01 | Disposition: A | Discharge status: Home / Self Care | Payer: Medicare Other | Source: Ambulatory Visit | Attending: Neurosurgery | Admitting: Neurosurgery

## 2018-05-01 ENCOUNTER — Other Ambulatory Visit: Payer: Self-pay | Admitting: Neurosurgery

## 2018-05-01 DIAGNOSIS — M5440 Lumbago with sciatica, unspecified side: Secondary | ICD-10-CM

## 2018-05-01 DIAGNOSIS — M545 Low back pain: Secondary | ICD-10-CM | POA: Diagnosis not present

## 2018-05-01 DIAGNOSIS — M47817 Spondylosis without myelopathy or radiculopathy, lumbosacral region: Secondary | ICD-10-CM | POA: Diagnosis not present

## 2018-05-01 MED ORDER — METHYLPREDNISOLONE ACETATE 40 MG/ML INJ SUSP (RADIOLOG
120.0000 mg | Freq: Once | INTRAMUSCULAR | Status: AC
  Administered 2018-05-01: 120 mg via INTRA_ARTICULAR

## 2018-05-01 MED ORDER — IOPAMIDOL (ISOVUE-M 200) INJECTION 41%
1.0000 mL | Freq: Once | INTRAMUSCULAR | Status: AC
  Administered 2018-05-01: 1 mL via INTRA_ARTICULAR

## 2018-05-01 NOTE — Discharge Instructions (Signed)

## 2018-05-02 ENCOUNTER — Encounter: Payer: Self-pay | Admitting: Neurology

## 2018-05-14 DIAGNOSIS — F341 Dysthymic disorder: Secondary | ICD-10-CM | POA: Diagnosis not present

## 2018-05-30 ENCOUNTER — Ambulatory Visit: Payer: Self-pay | Admitting: Neurology

## 2018-06-18 ENCOUNTER — Emergency Department
Admission: EM | Admit: 2018-06-18 | Discharge: 2018-06-18 | Disposition: A | Discharge status: Home / Self Care | Payer: Medicare Other | Attending: Emergency Medicine | Admitting: Emergency Medicine

## 2018-06-18 ENCOUNTER — Emergency Department: Payer: Medicare Other

## 2018-06-18 ENCOUNTER — Encounter: Payer: Self-pay | Admitting: Emergency Medicine

## 2018-06-18 DIAGNOSIS — Z8673 Personal history of transient ischemic attack (TIA), and cerebral infarction without residual deficits: Secondary | ICD-10-CM | POA: Insufficient documentation

## 2018-06-18 DIAGNOSIS — R51 Headache: Secondary | ICD-10-CM | POA: Diagnosis not present

## 2018-06-18 DIAGNOSIS — I10 Essential (primary) hypertension: Secondary | ICD-10-CM | POA: Diagnosis not present

## 2018-06-18 DIAGNOSIS — Z79899 Other long term (current) drug therapy: Secondary | ICD-10-CM | POA: Diagnosis not present

## 2018-06-18 DIAGNOSIS — Z7901 Long term (current) use of anticoagulants: Secondary | ICD-10-CM | POA: Insufficient documentation

## 2018-06-18 DIAGNOSIS — R519 Headache, unspecified: Secondary | ICD-10-CM

## 2018-06-18 DIAGNOSIS — E119 Type 2 diabetes mellitus without complications: Secondary | ICD-10-CM | POA: Diagnosis not present

## 2018-06-18 DIAGNOSIS — Z87891 Personal history of nicotine dependence: Secondary | ICD-10-CM | POA: Diagnosis not present

## 2018-06-18 DIAGNOSIS — R52 Pain, unspecified: Secondary | ICD-10-CM | POA: Diagnosis not present

## 2018-06-18 DIAGNOSIS — R42 Dizziness and giddiness: Secondary | ICD-10-CM | POA: Diagnosis not present

## 2018-06-18 DIAGNOSIS — R29898 Other symptoms and signs involving the musculoskeletal system: Secondary | ICD-10-CM | POA: Diagnosis not present

## 2018-06-18 LAB — URINALYSIS, COMPLETE (UACMP) WITH MICROSCOPIC
Bacteria, UA: NONE SEEN
Bilirubin Urine: NEGATIVE
Glucose, UA: 150 mg/dL — AB
Hgb urine dipstick: NEGATIVE
Ketones, ur: NEGATIVE mg/dL
Leukocytes,Ua: NEGATIVE
Nitrite: NEGATIVE
Protein, ur: NEGATIVE mg/dL
Specific Gravity, Urine: 1.011 (ref 1.005–1.030)
Squamous Epithelial / HPF: NONE SEEN (ref 0–5)
pH: 6 (ref 5.0–8.0)

## 2018-06-18 LAB — COMPREHENSIVE METABOLIC PANEL
ALT: 18 U/L (ref 0–44)
AST: 23 U/L (ref 15–41)
Albumin: 4.2 g/dL (ref 3.5–5.0)
Alkaline Phosphatase: 75 U/L (ref 38–126)
Anion gap: 8 (ref 5–15)
BUN: 11 mg/dL (ref 8–23)
CO2: 28 mmol/L (ref 22–32)
Calcium: 8.5 mg/dL — ABNORMAL LOW (ref 8.9–10.3)
Chloride: 104 mmol/L (ref 98–111)
Creatinine, Ser: 1.06 mg/dL (ref 0.61–1.24)
GFR calc Af Amer: >60 mL/min (ref >60)
GFR calc non Af Amer: >60 mL/min (ref >60)
Glucose, Bld: 165 mg/dL — ABNORMAL HIGH (ref 70–99)
Potassium: 3.7 mmol/L (ref 3.5–5.1)
Sodium: 140 mmol/L (ref 135–145)
Total Bilirubin: 0.7 mg/dL (ref 0.3–1.2)
Total Protein: 6.9 g/dL (ref 6.5–8.1)

## 2018-06-18 LAB — CBC WITH DIFFERENTIAL/PLATELET
Abs Immature Granulocytes: 0.01 10*3/uL (ref 0.00–0.07)
Basophils Absolute: 0 10*3/uL (ref 0.0–0.1)
Basophils Relative: 0 %
Eosinophils Absolute: 0 10*3/uL (ref 0.0–0.5)
Eosinophils Relative: 1 %
HCT: 39.6 % (ref 39.0–52.0)
Hemoglobin: 13.2 g/dL (ref 13.0–17.0)
Immature Granulocytes: 0 %
Lymphocytes Relative: 29 %
Lymphs Abs: 1.1 10*3/uL (ref 0.7–4.0)
MCH: 30.2 pg (ref 26.0–34.0)
MCHC: 33.3 g/dL (ref 30.0–36.0)
MCV: 90.6 fL (ref 80.0–100.0)
Monocytes Absolute: 0.3 10*3/uL (ref 0.1–1.0)
Monocytes Relative: 8 %
Neutro Abs: 2.3 10*3/uL (ref 1.7–7.7)
Neutrophils Relative %: 62 %
Platelets: 111 10*3/uL — ABNORMAL LOW (ref 150–400)
RBC: 4.37 MIL/uL (ref 4.22–5.81)
RDW: 12.6 % (ref 11.5–15.5)
WBC: 3.7 10*3/uL — ABNORMAL LOW (ref 4.0–10.5)
nRBC: 0 % (ref 0.0–0.2)

## 2018-06-18 LAB — TROPONIN I: Troponin I: <0.03 ng/mL (ref <0.03)

## 2018-06-18 MED ORDER — LABETALOL HCL 5 MG/ML IV SOLN
10.0000 mg | Freq: Once | INTRAVENOUS | Status: AC
  Administered 2018-06-18: 10 mg via INTRAVENOUS
  Filled 2018-06-18: qty 4

## 2018-06-18 NOTE — ED Provider Notes (Signed)
[facility] Emergency Department Provider Note   ____________________________________________   First MD Initiated Contact with Patient 06/18/18 1912     (approximate)  I have reviewed the triage vital signs and the nursing notes.   HISTORY  Chief Complaint Headache and Dizziness    HPI Charles [name] is a 63 y.o. male patient complains of diffuse headache and dizziness.  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  He can read my large print.  Headache is not sharp or tight is just a count of diffuse ache worse in the front.  Nothing seems to make it better or worse.  Came on gradually.  As I understand it.  It was severe but is gradually getting better.  Patient has no new numbness or weakness.       Past Medical History:  Diagnosis Date   Anemia    Asthma    Deaf    Essential hypertension    GERD (gastroesophageal reflux disease)    History of migraine    History of stroke    Spastic hemiplegia, right MCA stroke April 2014 in [place]   Mixed hyperlipidemia    Other pancytopenia ([place]) 12/23/2015   PAD (peripheral artery disease) ([place])    Sarcoidosis    Seizures ([place])    Shingles    Stroke ([place])    Type 2 diabetes mellitus ([place])     Patient Active Problem List   Diagnosis Date Noted   Cerebrovascular accident (CVA) ([place]) 03/01/2018   Polypharmacy 03/01/2018   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   Nonintractable headache 06/27/2017   Mixed hyperlipidemia 05/17/2017   Uncontrolled type 2 diabetes mellitus with hyperglycemia ([place]) 05/16/2017   Thrombocytopenia ([place]) 09/13/2016   Hypoglycemia    Syncope 09/12/2016   Seizure disorder as sequela of cerebrovascular accident ([place]) 09/12/2016   MDD (major depressive disorder), recurrent episode, severe ([place]) 12/30/2015   Suicidal  ideation    Other pancytopenia ([place]) 12/23/2015   Loss of weight 11/12/2015   Myelopathy, spondylogenic, 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   Iron deficiency anemia 09/22/2015   Spinal stenosis in cervical region 08/21/2015   Neck pain 08/12/2015   Low back pain 08/12/2015   Abnormality of gait 08/12/2015   Neuropathy of right upper extremity 06/29/2015   Acute rhinitis 05/05/2015   Abdominal pain, epigastric 04/23/2015   Arm pain, right 04/23/2015   Atypical chest pain 07/23/2013   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 HE HAD VERY FEWEPISODE OF REGURGITATION RECORDED IN THE 2 DAYS THE STUDY WAS PERFORMED.    COLONOSCOPY  2011   IN [place]   ESOPHAGOGASTRODUODENOSCOPY N/A 06/18/2013   Dr.Fields- probable proximal esophageal web,dilation performed, bravo cap placed, mild non-erosive gastritis in the gastric antrum and on the greater curvature of the gastric body bx- granulomatous gastritis, duodenal mucosa showed no abnormalities in  the bulb and second portion of the duodenum.    ESOPHAGOGASTRODUODENOSCOPY N/A 05/05/2015   Procedure: ESOPHAGOGASTRODUODENOSCOPY (EGD);  Surgeon: Danie Binder, MD;  [facility];  Service: Endoscopy;  Laterality: N/A;  [place] N/A 06/18/2013   Procedure: MALONEY DILATION;  Surgeon: Danie Binder, MD;  [facility];  Service: Endoscopy;   Laterality: N/A;   OTHER SURGICAL HISTORY     cyst removal head and chest   POSTERIOR CERVICAL FUSION/FORAMINOTOMY N/A 10/07/2015   Procedure: Cervical Three-Four, Cervical Four-Five, Cervical Five-Six, Cervical Six-Seven Posterior Cervical Laminectomy and Fusion;  Surgeon: Kary Kos, MD;  Location: [place] [facility];  Service: Neurosurgery;  Laterality: N/A;   SAVORY DILATION N/A 06/18/2013   Procedure: SAVORY DILATION;  Surgeon: Danie Binder, MD;  [facility];  Service: Endoscopy;  Laterality: N/A;   TEE WITHOUT CARDIOVERSION N/A 10/04/2012   Procedure: TRANSESOPHAGEAL ECHOCARDIOGRAM (TEE);  Surgeon: Thayer Headings, MD;  Location: [place];  Service: Cardiovascular;  Laterality: N/A;   [place] accident  Both legs fractured and repaired surgically    Prior to Admission medications   Medication Sig Start Date End Date Taking? Authorizing Provider  albuterol (PROAIR HFA) 108 (90 Base) MCG/ACT inhaler Inhale 2 puffs into the lungs every 4 (four) hours as needed for wheezing or shortness of breath.     [provider]  atorvastatin (LIPITOR) 10 MG tablet Take 10 mg by mouth daily. (cholesterol)    [provider]  Cholecalciferol (VITAMIN D3 PO) Take 3,000 Units by mouth daily.    [provider]  clopidogrel (PLAVIX) 75 MG tablet Take 75 mg by mouth daily.    [provider]  DULoxetine (CYMBALTA) 60 MG capsule Take 60 mg by mouth daily.    [provider]  escitalopram (LEXAPRO) 10 MG tablet Take 10 mg by mouth daily.    [provider]  hydrochlorothiazide (MICROZIDE) 12.5 MG capsule Take 12.5 mg by mouth daily.    [provider]  IncobotulinumtoxinA ([name] IM) Inject 500 Units into the muscle every 3 (three) months.    [provider]  lamoTRIgine (LAMICTAL) 100 MG tablet One tab in the morning, 2 tabs at night 03/01/18   Marcial Pacas, MD  losartan (COZAAR) 50 MG tablet Take  50 mg by mouth daily.    [provider]  oxybutynin (DITROPAN XL) 15 MG 24 hr tablet Take 15 mg by mouth at bedtime.    [provider]  sitaGLIPtin (JANUVIA) 100 MG tablet Take 100 mg by mouth daily.    [provider]    Allergies Hydrocodone; Shellfish allergy; Metformin; Contrast media [iodinated diagnostic agents]; and Oxycodone  Family History  Problem Relation Age of Onset   Diabetes Mother    Hypertension Mother    Diabetes Father    Hypertension Father    Colon polyps Neg Hx    Colon cancer Neg Hx     Social History Social History   Tobacco Use   Smoking status: Former Smoker    Packs/day: 1.00    Years: 29.00    Pack years: 29.00    Types: Cigarettes    Last attempt to quit: 02/22/1992    Years since quitting: 26.3   Smokeless tobacco: Never Used  Substance Use Topics   Alcohol use: Yes    Alcohol/week: 1.0 - 2.0 standard drinks    Types: 1 - 2 Cans of beer  per week    Comment: occasionally, every 2-3 days   Drug use: Yes    Types: Marijuana, "Crack" cocaine    Comment: hx of crack cocaine "long time ago" per pt - hard to tell how long it is has been since he used    Review of Systems  Constitutional: No fever/chills Eyes: No visual changes. ENT: No sore throat. Cardiovascular: Denies chest pain. Respiratory: Denies shortness of breath. Gastrointestinal: Currently no abdominal pain.  No nausea, no vomiting.  No diarrhea.  No constipation. Genitourinary: Negative for dysuria. Musculoskeletal: Negative for back pain. Skin: Negative for rash. Neurological: Negative for new focal weakness   ____________________________________________   PHYSICAL EXAM:  VITAL SIGNS: ED Triage Vitals  Enc Vitals Group     BP 06/18/18 1858 (!) 169/93     Pulse Rate 06/18/18 1858 70     Resp 06/18/18 1858 16     Temp 06/18/18 1858 98.1 F (36.7 C)     Temp Source 06/18/18 1858 Oral     SpO2 06/18/18 1858 97 %     Weight 06/18/18  1857 179 lb 14.3 oz (81.6 kg)     Height --      Head Circumference --      Peak Flow --      Pain Score --      Pain Loc --      Pain Edu? --      Excl. in [place]? --     Constitutional: Alert and oriented. Well appearing and in no acute distress. Eyes: Conjunctivae are normal. PER. EOMI. Head: Atraumatic. Nose: No congestion/rhinnorhea. Mouth/Throat: Mucous membranes are moist.  Oropharynx non-erythematous. Neck: No stridor.   Cardiovascular: Normal rate, regular rhythm. Grossly normal heart sounds.  Good peripheral circulation. Respiratory: Normal respiratory effort.  No retractions. Lungs CTAB. Gastrointestinal: Soft and nontender. No distention. No abdominal bruits. No CVA tenderness. Musculoskeletal: No lower extremity tenderness nor edema.   Neurologic:  Normal speech and language. No new gross focal neurologic deficits are appreciated. Skin:  Skin is warm, dry and intact. No rash noted.   ____________________________________________   LABS (all labs ordered are listed, but only abnormal results are displayed)  Labs Reviewed  COMPREHENSIVE METABOLIC PANEL - Abnormal; Notable for the following components:      Result Value   Glucose, Bld 165 (*)    Calcium 8.5 (*)    All other components within normal limits  CBC WITH DIFFERENTIAL/PLATELET - Abnormal; Notable for the following components:   WBC 3.7 (*)    Platelets 111 (*)    All other components within normal limits  URINALYSIS, COMPLETE (UACMP) WITH MICROSCOPIC - Abnormal; Notable for the following components:   Color, Urine STRAW (*)    APPearance CLEAR (*)    Glucose, UA 150 (*)    All other components within normal limits  TROPONIN I   ____________________________________________  EKG   ____________________________________________  RADIOLOGY  ED MD interpretation: CT read by radiology reviewed by me shows no new findings chest x-ray shows no evidence of pneumonia congestive heart failure etc.  There is  some reduction sarcoid.  Official radiology report(s): Ct Head Wo Contrast  Result Date: 06/18/2018 CLINICAL DATA:  63 year old male with a history of headache and dizziness EXAM: CT HEAD WITHOUT CONTRAST TECHNIQUE: Contiguous axial images were obtained from the base of the skull through the vertex without intravenous contrast. COMPARISON:  11/18/2017, MR 03/22/2018 FINDINGS: Brain: No acute intracranial hemorrhage. No midline shift or mass effect. Gray-white  differentiation maintained. Hypodensity in the periventricular white matter is unchanged. Encephalomalacia of the right frontal parietal region with expansion of the lateral ventricle. Vascular: Intracranial atherosclerosis Skull: No acute fracture.  No aggressive bone lesion identified. Sinuses/Orbits: Unremarkable appearance of the orbits. Mastoid air cells clear. No middle ear effusion. No significant sinus disease. Other: None IMPRESSION: No acute intracranial abnormality. Redemonstration of chronic microvascular ischemic disease and encephalomalacia in the distribution of the right MCA. Electronically Signed   By: Corrie Mckusick D.O.   On: 06/18/2018 20:17   Dg Chest Portable 1 View  Result Date: 06/18/2018 CLINICAL DATA:  Headache dizziness for 1 week. Prior CVA. Hypertension and diabetes. Sarcoidosis. EXAM: PORTABLE CHEST 1 VIEW COMPARISON:  02/25/2018 FINDINGS: Calcified mediastinal and perihilar nodes likely related to the patient's stated sarcoidosis. Low lung volumes with some architectural distortion/scarring. Mild nodularity peripherally in the right upper lobe is probably related to the underlying scarring. Mild atelectasis along the left hemidiaphragm which was not conspicuous on the prior exam. Lung volumes are lower than on the prior exam in general. IMPRESSION: 1. Findings likely related to the patient's prior sarcoidosis, including calcified lymph nodes and scattered scarring in the lungs. Accounting for the low lung volumes on  today's exam, I do not see obvious new infiltrates although there may be some slightly increased atelectasis along the left hemidiaphragm. If more detailed characterization of the lung parenchyma is desired, chest CT would be the exam of choice. Electronically Signed   By: Van Clines M.D.   On: 06/18/2018 19:54    ____________________________________________   PROCEDURES  Procedure(s) performed (including Critical Care):  Procedures   ____________________________________________   INITIAL IMPRESSION / ASSESSMENT AND PLAN / ED COURSE  Patient's blood pressure gradually comes down.  His symptoms resolved.  I will let him go have him follow-up with his regular doctor next couple days.  He may need his blood pressure medications increased.             ____________________________________________   FINAL CLINICAL IMPRESSION(S) / ED DIAGNOSES  Final diagnoses:  Hypertension, unspecified type  Acute nonintractable headache, unspecified headache type     ED Discharge Orders    None       Note:  This document was prepared using Dragon voice recognition software and may include unintentional dictation errors.    Nena Polio, MD 06/18/18 2228

## 2018-06-18 NOTE — ED Notes (Signed)
Called group home at [phone] and notified them of patient's pending discharge.  Home to call back once they have arranged transportation.

## 2018-06-18 NOTE — ED Triage Notes (Signed)
Arrives [facility].  Arrives from [facility].  C/O headache, dizziness x 1 week.  Hx HTN, CVA.  patient is deaf and mute.  communicates with sign language.  left hemiporesis from prior CVA.  BP:  208/108  P:  75  SpO2: 98%  R;  18  CBG:  172.

## 2018-06-18 NOTE — ED Notes (Signed)
Called patient's brother and updated him on patient status and planned discharge.

## 2018-06-18 NOTE — Discharge Instructions (Addendum)
I think the headache that she had was due to your high blood pressure.  Since everything is better now I will let you go home.  I do want you to see your regular doctor in the next couple days to check on you and your blood pressure.  Please return here if you have a recurrence of the symptoms again.

## 2018-07-05 DIAGNOSIS — F341 Dysthymic disorder: Secondary | ICD-10-CM | POA: Diagnosis not present

## 2018-07-26 ENCOUNTER — Telehealth: Payer: Self-pay | Admitting: Neurology

## 2018-07-26 NOTE — Telephone Encounter (Signed)
Pt brother has called to inform where pt lives, they are asking if appointment can be pushed out for 4 weeks.  Please call

## 2018-07-26 NOTE — Telephone Encounter (Signed)
His appt has been rescheduled to 09/12/2018.  His brother, Sava Proby (on [place]) is aware of the new time.  I also spoke [place] at [facility] [phone]) and made them aware of the appt change.

## 2018-07-30 ENCOUNTER — Ambulatory Visit: Payer: Self-pay | Admitting: Neurology

## 2018-07-30 NOTE — Telephone Encounter (Signed)
Noted, thank you. DW  °

## 2018-08-06 ENCOUNTER — Ambulatory Visit: Payer: Medicare Other | Admitting: Neurology

## 2018-08-22 DIAGNOSIS — F332 Major depressive disorder, recurrent severe without psychotic features: Secondary | ICD-10-CM | POA: Diagnosis not present

## 2018-08-30 DIAGNOSIS — I10 Essential (primary) hypertension: Secondary | ICD-10-CM | POA: Diagnosis not present

## 2018-08-30 DIAGNOSIS — Z6828 Body mass index (BMI) 28.0-28.9, adult: Secondary | ICD-10-CM | POA: Diagnosis not present

## 2018-08-30 DIAGNOSIS — J45909 Unspecified asthma, uncomplicated: Secondary | ICD-10-CM | POA: Diagnosis not present

## 2018-08-30 DIAGNOSIS — E1165 Type 2 diabetes mellitus with hyperglycemia: Secondary | ICD-10-CM | POA: Diagnosis not present

## 2018-08-30 DIAGNOSIS — I739 Peripheral vascular disease, unspecified: Secondary | ICD-10-CM | POA: Diagnosis not present

## 2018-08-30 DIAGNOSIS — Z789 Other specified health status: Secondary | ICD-10-CM | POA: Diagnosis not present

## 2018-08-30 DIAGNOSIS — Z299 Encounter for prophylactic measures, unspecified: Secondary | ICD-10-CM | POA: Diagnosis not present

## 2018-09-05 DIAGNOSIS — E1165 Type 2 diabetes mellitus with hyperglycemia: Secondary | ICD-10-CM | POA: Diagnosis not present

## 2018-09-05 DIAGNOSIS — I639 Cerebral infarction, unspecified: Secondary | ICD-10-CM | POA: Diagnosis not present

## 2018-09-05 DIAGNOSIS — I10 Essential (primary) hypertension: Secondary | ICD-10-CM | POA: Diagnosis not present

## 2018-09-05 DIAGNOSIS — Z299 Encounter for prophylactic measures, unspecified: Secondary | ICD-10-CM | POA: Diagnosis not present

## 2018-09-05 DIAGNOSIS — Z6828 Body mass index (BMI) 28.0-28.9, adult: Secondary | ICD-10-CM | POA: Diagnosis not present

## 2018-09-05 DIAGNOSIS — I739 Peripheral vascular disease, unspecified: Secondary | ICD-10-CM | POA: Diagnosis not present

## 2018-09-10 ENCOUNTER — Telehealth: Payer: Self-pay | Admitting: Neurology

## 2018-09-10 NOTE — Telephone Encounter (Signed)
Pts brother [name] called in wanting to know if pt can be checked foe headaches while in office on 07/22 for injection

## 2018-09-10 NOTE — Telephone Encounter (Signed)
He lives at home with his brother, [name].  Reports the patient is having headaches nearly every day.  He would like to discuss this issue at his next appt.

## 2018-09-12 ENCOUNTER — Other Ambulatory Visit: Payer: Self-pay

## 2018-09-12 ENCOUNTER — Encounter: Payer: Self-pay | Admitting: Neurology

## 2018-09-12 ENCOUNTER — Ambulatory Visit (INDEPENDENT_AMBULATORY_CARE_PROVIDER_SITE_OTHER): Payer: Medicare Other | Admitting: Neurology

## 2018-09-12 VITALS — BP 131/75 | HR 74

## 2018-09-12 DIAGNOSIS — G43709 Chronic migraine without aura, not intractable, without status migrainosus: Secondary | ICD-10-CM

## 2018-09-12 DIAGNOSIS — G8114 Spastic hemiplegia affecting left nondominant side: Secondary | ICD-10-CM

## 2018-09-12 DIAGNOSIS — IMO0002 Reserved for concepts with insufficient information to code with codable children: Secondary | ICD-10-CM | POA: Insufficient documentation

## 2018-09-12 DIAGNOSIS — I69398 Other sequelae of cerebral infarction: Secondary | ICD-10-CM | POA: Diagnosis not present

## 2018-09-12 DIAGNOSIS — R51 Headache: Secondary | ICD-10-CM | POA: Diagnosis not present

## 2018-09-12 DIAGNOSIS — G8929 Other chronic pain: Secondary | ICD-10-CM

## 2018-09-12 DIAGNOSIS — G40909 Epilepsy, unspecified, not intractable, without status epilepticus: Secondary | ICD-10-CM

## 2018-09-12 DIAGNOSIS — I63311 Cerebral infarction due to thrombosis of right middle cerebral artery: Secondary | ICD-10-CM

## 2018-09-12 DIAGNOSIS — R519 Headache, unspecified: Secondary | ICD-10-CM | POA: Insufficient documentation

## 2018-09-12 MED ORDER — INCOBOTULINUMTOXINA 100 UNITS IM SOLR
500.0000 [IU] | INTRAMUSCULAR | Status: DC
  Administered 2018-09-12: 16:00:00 500 [IU] via INTRAMUSCULAR

## 2018-09-12 MED ORDER — LAMOTRIGINE 100 MG PO TABS: 200.0000 mg | ORAL_TABLET | Freq: Two times a day (BID) | ORAL | 4 refills | Status: DC

## 2018-09-12 NOTE — Addendum Note (Signed)
Addended by: Marcial Pacas on: 09/12/2018 04:08 PM   Modules accepted: Orders

## 2018-09-12 NOTE — Progress Notes (Addendum)
Chief Complaint  Patient presents with  . Left Spastic Hemiplegia    [name] 100 units x 5 units - office supply  . Frequent Headaches    Reports having headaches nearly every day.  He has ibuprofen 600mg  tablets prescribed to him but it does not always help his headaches.      PATIENT: Charles [name] DOB: 1955-10-22  HISTORICAL  [employer] is a 63 years old right-handed male, accompanied by his mother, and his brother, interpreter, for EMG guided Botox injection for his spastic left hemiparesis,I saw him first in Nov 2015, he was previously patients of Dr. Leonie Man, and Dr. Janann Colonel, who has performed EMG guided Botox injection in January, April, July 2015, for his spastic left hemiparesis, which he responded very well  He was born deaf, He had a history of pulmonary sarcoidosis, was treated with long-term low-dose steroid, also with past medical history of hypertension, diabetes, hyperlipidemia,  He suffered stroke in April 2014, was treated at [place], with residual severe left hemiparesis, per record  MRI of the brain showed remote right hemispheric infarct and small vessel disease type changes  MRA of brain abrupt  cutoff of flow at the right carotid terminus with nonvisualization of right middle cerebral artery branch vessels consistent with the patient's remote right hemispheric infarct. Fetal type origin of the posterior cerebral arteries with posterior cerebral artery supplied predominately from the anterior circulation. 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   2D Echocardiogram 60%, wall motion normal, LA normal size Carotid Doppler Bilateral: 1-39% ICA stenosis. Vertebral artery flow is antegrade  TEE no PFO, Pulm AVM  EEG in August 2014, normal  awake and asleep EEG. No focal or generalized epileptiform discharges noted   UPDATE May 8th 2017: He is with his mother and brother at today's clinical visit, last visit was November 25th 2015  for EMG guided Botox injection to his spastic left upper and lower extremity, he denies significant improvement with Botox injection.  Today he came in with a new issue, complains a year history of intermittent right elbow discomfort, radiating pain to right arm, right hand, mainly involving right fifths, and fourth fingers, it happened with prolonged sitting, eating, or sleeping, he also noticed mild right hand weakness, difficulty to unscrew the bottle top. 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 He complains progressive worsening gait abnormality, falling few times, neck pain, low back pain, radiating pain to his right leg  UPDATE August 24 2015: He is accompanied by his mother brother and interpreter at today's clinical visit, he continue complains of low back pain radiating pain to right lower extremity, intermittent right arm hands paresthesia, worsening gait difficulty, urinary urgency,  We have personally reviewed MRI of cervical spine in June 2017: There is severe spinal stenosis at C3-C4 and at C4-C5 due to central disc protrusions/herniations, uncovertebral spurring, facet hypertrophy and congenitally short pedicles. There is subtle hyperintense signal within the spinal cord on sagittal STIR images just below the C3-C4 interspace but this is not confirmed on axial images. This could represent a mild cervical myelopathy. 2. There is moderate spinal stenosis at C5-C6 and C6-C7 mild spinal stenosis at C2-C3 to degenerative changes and congenitally short  pedicles. 3. At C3-C4 there is moderately severe bilateral foraminal narrowing at could lead to impingement either of the C4 nerve roots. There does not  appear to be nerve root impingement at the other cervical levels  MRI of lumbar spine in June 2017: At L4-L5, there is broad disc bulging, moderately severe facet hypertrophy, right greater than left, and minimal anterolisthesis. There is no nerve root compression at this level. When compared to the MRI dated 12/24/2012, the synovial cyst that was noted to the left on the prior MRI is no longer present. This relieves the left L5 nerve root impingement that was noted at that time. However, since the prior MRI there has been mild progression of the facet hypertrophy and minimal anterolisthesis . 2. There are milder degenerative changes at L3-L4 and L5-S1 that did not lead to any nerve root impingement, unchanged when compared to the previous MRI.  UPDATE Dec 03 2015: He had cervical decompression laminectomy C3-4-5 6 with foraminotomies of the C4-5 VI nerve roots by Dr. Saintclair Halsted on October 07 2015, patient reported significant improvement in his neck pain, family noticed he has difficulty sleeping, agitated.  I personally reviewed MRI cervical spine in September 2017: Posterior hardware fusion C3 through C7. Posterior decompression C3 through C5. Small posterior fluid collection in the soft tissues may represent postop fluid versus CSF leak. Small area of cord hyperintensity at the C4 level is unchanged compatible with chronic myelomalacia. No cord compression.  He complains of low back pain,  MRI of left femur there was no significant abnormality notice, MRI lumbar in June 2017, multilevel degenerative changes, but there was no significant canal or foraminal stenosis   UPDATE Jan 16th 2018: He complains of neck pain, using heating pad, stiff, limited range of motion, lean back to get relief, difficulty to get a comfortable position to sleep, he lost drip of his right hand,  Left leg pain when bearing weight, left leg tightness,   CAT scan of the brain in August 2017 showed large right MCA stroke   Update Jul 05 2016: He complains of worsening left-sided low back pain, unsteady gait, we have personally reviewed MRI of lumbar in June 2017, only mild degenerative changes, there is no significant canal or foraminal stenosis.  He is taking Mobic/naproxen as needed, worsening spastic left hemiparesis, left hands in position   UPDATE June 5th 2018: He returned for a electronic stimulation guided xeomin injection for spastic left hemiparesis, was emphasize on left upper extremity today,  UPDATE Feb 08 2017: He did respond some to previous xeomin injection 500 units, still has significant left arm spasticity, weakness, left anterior thigh muscle spasm in the achy pain  UPDATE Jun 27 2017: He presented to the emergency room on April 22, 2017, complains of the headaches, apparently he was given cocktail of Compazine, Benadryl, Decadron,  CT head without contrast showed chronic right MCA stroke, no acute abnormality  Laboratory evaluation showed normal TSH, free T4, lipid panel, A1c was mildly elevated 6.0, MRI of left hip on April 26, 2017 showed mild marginal osteophytes on the femoral head, stable since 2017,  He was given right greater occipital nerve perineural steroid injection on June 19, 2017 by Dr. Rolla Flatten, his headache overall has much improved,  UPDATE August 30 2017: He now lives at nursing home, there is no recurrent seizure, he is not taking lower dose of lamotrigine 100 mg daily, responding well to previous EMG guided xeomin injection.  UPDATE Nov 29 2017: He  was not sure about the benefit from previous injection, no recurrent seizure, was noted to have left anterior shin area swelling,  UPDATE Jan 9th 2020: He presented to emergency room on February 25, 2018 for increased aggressive behavior at nursing home noncompliant with his medications, instead of continual lamotrigine, he was given a new list of uncollected medications Keppra 500 mg twice daily, which was known to cause  increased agitation for him in the past,  We went over his polypharmacy in detail, will change him back to lamotrigine, also check the lamotrigine level for compliance, received 500 units of [name] injection for spastic left upper extremity today  He was noted to have worsening confusion, we personally reviewed CT scan in September 2019: No acute abnormality, large right MCA stroke encephalomalacia, extensive periventricular small vessel disease  UPDATE September 12 2018: He is doing well, has no recurrent seizure, but complains of frequent headaches, over-the-counter medicine does not help, lateralized mild to moderate headache, with light noise sensitivity, but made it worse, resting was helpful, lasting half day to whole day  he is discharged from nursing home to be with his brother because of his behavior issues, he was on Keppra and lamotrigine, Keppra caused emotional outburst, will stop the Keppra, change to lamotrigine 100 mg 2 tablets twice a day,  Laboratory evaluation in January 2020, lamotrigine was not detected, Keppra was 17.9, normal B12, CMP showed elevated glucose 165, calcium of 8.5, negative troponin   REVIEW OF SYSTEMS: Full 14 system review of systems performed and notable only for as above ALLERGIES: Allergies  Allergen Reactions  . Hydrocodone Other (See Comments)    Makes "looney"  . Shellfish Allergy Hives and Swelling  . Metformin Diarrhea  . Contrast Media [Iodinated Diagnostic Agents] Itching  . Oxycodone Nausea And Vomiting    HOME MEDICATIONS: Current Outpatient Medications on File Prior to Visit  Medication Sig Dispense Refill  . albuterol (PROAIR HFA) 108 (90 Base) MCG/ACT inhaler Inhale 2 puffs into the lungs every 4 (four) hours as needed for wheezing or shortness of breath.     [name] atorvastatin (LIPITOR) 10 MG tablet Take 10 mg by mouth daily. (cholesterol)    . Cholecalciferol (VITAMIN D3 PO) Take 25 mcg by mouth daily.     . clopidogrel (PLAVIX) 75 MG  tablet Take 75 mg by mouth daily.    [name] escitalopram (LEXAPRO) 10 MG tablet Take 10 mg by mouth daily.    [name] ibuprofen (ADVIL) 600 MG tablet Take 600 mg by mouth every 6 (six) hours as needed.    . IncobotulinumtoxinA ([name] IM) Inject 500 Units into the muscle every 3 (three) months.    . lamoTRIgine (LAMICTAL) 100 MG tablet One tab in the morning, 2 tabs at night 90 tablet 11  . losartan (COZAAR) 100 MG tablet Take 100 mg by mouth daily.    [name] omeprazole (PRILOSEC) 20 MG capsule Take 20 mg by mouth daily.    . tamsulosin (FLOMAX) 0.4 MG CAPS capsule Take 0.4 mg by mouth daily.    . traZODone (DESYREL) 50 MG tablet Take 50 mg by mouth at bedtime.     No current facility-administered medications on file prior to visit.     PAST MEDICAL HISTORY: Past Medical History:  Diagnosis Date  . Anemia   . Asthma   . Deaf   . Essential hypertension   . GERD (gastroesophageal reflux disease)   . History of migraine   . History of stroke  Spastic hemiplegia, right MCA stroke April 2014 in [place]  . Mixed hyperlipidemia   . Other pancytopenia ([place]) 12/23/2015  . PAD (peripheral artery disease) ([place])   . Sarcoidosis   . Seizures ([place])   . Shingles   . Stroke ([place])   . Type 2 diabetes mellitus ([place])     PAST SURGICAL HISTORY: Past Surgical History:  Procedure Laterality Date  . BRAVO PH STUDY N/A 06/18/2013   pH STUDY SHOWS NEXIUM TWICE DAILY CONTROLS THE ACID IN HIS STOMACH. HE HAD VERY FEWEPISODE OF REGURGITATION RECORDED IN THE 2 DAYS THE STUDY WAS PERFORMED.   [name] COLONOSCOPY  2011   [place]  . 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   . ESOPHAGOGASTRODUODENOSCOPY N/A 05/05/2015    Procedure: ESOPHAGOGASTRODUODENOSCOPY (EGD);  Surgeon: Danie Binder, MD;  Location: [facility];  Service: Endoscopy;  Laterality: N/A;  730  . MALONEY DILATION N/A 06/18/2013   Procedure: MALONEY DILATION;  Surgeon: Danie Binder, MD;  Location: [facility];  Service: Endoscopy;  Laterality: N/A;  . OTHER SURGICAL HISTORY     cyst removal head and chest  . POSTERIOR CERVICAL FUSION/FORAMINOTOMY N/A 10/07/2015   Procedure: Cervical Three-Four, Cervical Four-Five, Cervical Five-Six, Cervical Six-Seven Posterior Cervical Laminectomy and Fusion;  Surgeon: Kary Kos, MD;  Location: [facility];  Service: Neurosurgery;  Laterality: N/A;  . SAVORY DILATION N/A 06/18/2013   Procedure: SAVORY DILATION;  Surgeon: Danie Binder, MD;  Location: [facility];  Service: Endoscopy;  Laterality: N/A;  . TEE WITHOUT CARDIOVERSION N/A 10/04/2012   Procedure: TRANSESOPHAGEAL ECHOCARDIOGRAM (TEE);  Surgeon: Thayer Headings, MD;  [facility];  Service: Cardiovascular;  Laterality: N/A;  . [place] accident  Both legs fractured and repaired surgically    FAMILY HISTORY: Family History  Problem Relation Age of Onset  . Diabetes Mother   . Hypertension Mother   . Diabetes Father   . Hypertension Father   . Colon polyps Neg Hx   . Colon cancer Neg Hx     SOCIAL HISTORY:  Social History   Socioeconomic History  . Marital status: Single    Spouse name: Not on file  . Number of children: 0  . Years of education: HS  . Highest education level: Not on file  Occupational History  . Occupation: Disabled  Social Needs  . Financial resource strain: Not on file  . Food insecurity    Worry: Not on file    Inability: Not on file  . Transportation needs    Medical: Not on file    Non-medical: Not on file  Tobacco Use  . Smoking status: Former Smoker    Packs/day: 1.00    Years: 29.00    Pack years: 29.00    Types: Cigarettes    Quit date: 02/22/1992     Years since quitting: 26.5  . Smokeless tobacco: Never Used  Substance and Sexual Activity  . Alcohol use: Yes    Alcohol/week: 1.0 - 2.0 standard drinks    Types: 1 - 2 Cans of beer per week    Comment: occasionally, every 2-3 days  . Drug use: Yes    Types: Marijuana, "Crack" cocaine    Comment:  hx of crack cocaine "long time ago" per pt - hard to tell how long it is has been since he used  . Sexual activity: Never  Lifestyle  . Physical activity    Days per week: Not on file    Minutes per session: Not on file  . Stress: Not on file  Relationships  . Social Herbalist on phone: Not on file    Gets together: Not on file    Attends religious service: Not on file    Active member of club or organization: Not on file    Attends meetings of clubs or organizations: Not on file    Relationship status: Not on file  . Intimate partner violence    Fear of current or ex partner: Not on file    Emotionally abused: Not on file    Physically abused: Not on file    Forced sexual activity: Not on file  Other Topics Concern  . Not on file  Social History Narrative   Patient lives at home with his family. Brother, sister, mother    Patient does not work.    Patient has a high school education.    Patient has one step child      PHYSICAL EXAM   Vitals:   09/12/18 1311  BP: 131/75  Pulse: 74    Not recorded      There is no height or weight on file to calculate BMI.   PHYSICAL EXAMNIATION:  Gen: NAD, conversant, well nourised, obese, well groomed                     Cardiovascular: Regular rate rhythm, no peripheral edema, warm, nontender. Eyes: Conjunctivae clear without exudates or hemorrhage Neck: Supple, no carotid bruise. Pulmonary: Clear to auscultation bilaterally   NEUROLOGICAL EXAM:  MENTAL STATUS: Speech: He is mute depend on interpreter for history,   CRANIAL NERVES: CN II: Visual fields are full to confrontation.  Pupils are round equal and  briskly reactive to light. CN III, IV, VI: extraocular movement are normal. No ptosis. CN V: Facial sensation is intact to pinprick in all 3 divisions bilaterally. Corneal responses are intact.  CN VII: Mild left lower face weakness CN VIII: deaf CN IX, X: Palate elevates symmetrically. Phonation is normal. CN XI: Head turning and shoulder shrug are intact CN XII: Tongue is midline with normal movements and no atrophy.  MOTOR: Spastic left hemiparesis, wearing left ankle brace, mild left shoulder antigravity movement left shoulder abduction with passive movement maximum 90, left elbow 170, complains of left elbow pain with passive movement, left thumb in position, left hip flexion 3, left knee extension 4, left knee flexion 4, he also has mild right ankle dorsiflexion weakness.  He has right elbow Tinel signs, mild right finger abduction weakness, mild right hand grip weakness  REFLEXES:  Hyperreflexia of both side, worse on the left side, bilateral Babinski sign.  SENSORY: Intact to light touch, pinprick, positional and vibratory sensation are intact in fingers and toes, with exception of decreased light touch in the right fifth, and half of right fourth finger extending to right ulnar palmar, dorsum of right hand  COORDINATION: There is no dysmetria on right finger-to-nose and heel-knee-shin.    GAIT/STANCE: Deferred     Lab Results  Component Value Date   WBC 3.7 (L) 06/18/2018   HGB 13.2 06/18/2018   HCT 39.6 06/18/2018   MCV 90.6 06/18/2018   PLT 111 (  L) 06/18/2018      Component Value Date/Time   NA 140 06/18/2018 1916   K 3.7 06/18/2018 1916   CL 104 06/18/2018 1916   CO2 28 06/18/2018 1916   GLUCOSE 165 (H) 06/18/2018 1916   BUN 11 06/18/2018 1916   CREATININE 1.06 06/18/2018 1916   CALCIUM 8.5 (L) 06/18/2018 1916   PROT 6.9 06/18/2018 1916   ALBUMIN 4.2 06/18/2018 1916   AST 23 06/18/2018 1916   ALT 18 06/18/2018 1916   ALKPHOS 75 06/18/2018 1916    BILITOT 0.7 06/18/2018 1916   GFRNONAA >60 06/18/2018 1916   GFRAA >60 06/18/2018 1916   Lab Results  Component Value Date   CHOL 160 09/13/2016   HDL 59 09/13/2016   LDLCALC 43 09/13/2016   TRIG 288 (H) 09/13/2016   CHOLHDL 2.7 09/13/2016   Lab Results  Component Value Date   HGBA1C 6.0 (H) 06/13/2017   Lab Results  Component Value Date   [id number] 778 03/01/2018   Lab Results  Component Value Date   TSH 1.83 06/13/2017      ASSESSMENT AND PLAN  Charles [name] is a 63 y.o. male  Congenital deaf, use sign language, history is through interpreter   Cervical spondylitic myelopathy  Status post cervical decompression surgery in August 2017, Continued to complains of neck pain  I have encouraged him neck stretching exercise, hot compression  History of seizure,  Difficulty tolerating Keppra, with increaseed aggressive behavior, ED presentation on February 25, 2017,  Stop Keppra, change to lamotrigine 100 mg 2 tablets twice a day  Worsening memory loss  Is most consistent with his large size right MCA stroke, encephalomalacia, extensive periventricular small vessel disease  Normal B12, TSH Frequent headaches  Has migraine features  Not a good candidate for polypharmacy due to age, vascular risk factor, polypharmacy, history of large right MCA stroke,  I performed Botox injection according to migraine protocol today  History of stroke, with residual spastic left hemiparesis   Continue electrical stimulation guided xeomin injection, used 500 units  Left pectoralis major 50 units Left Latissimus dorsi 50 units Left brachialis 100 units Left pronator teres 50 units Left flexor digitorum profundi 50 units Left flexor digitorum superficialis 25 units  Left opponents 25  units Left palmaris longus 25 units Left flexor carpi ulnaris 25 units  Botox injection for chronic migraine prevention, injection was performed according to [place] protocol,  5 units of Botox was  injected into each side, for 20 injection sites, total of 100  Bilateral frontalis 4 injection sites Bilateral corrugate 2 injection sites Procerus 1 injection sites. Bilateral temporalis 8 injection sites Bilateral occipitalis 6 injection sites    Marcial Pacas, M.D. Ph.D.  [facility] [address], [place], [place] [address] [phone]

## 2018-09-12 NOTE — Progress Notes (Signed)
**  [name] 100 units x 5 vials, NDC 0259-1610-01, Lot [id number], Exp 01/2020, office supply.//mck,rn**

## 2018-09-17 DIAGNOSIS — Z299 Encounter for prophylactic measures, unspecified: Secondary | ICD-10-CM | POA: Diagnosis not present

## 2018-09-17 DIAGNOSIS — I739 Peripheral vascular disease, unspecified: Secondary | ICD-10-CM | POA: Diagnosis not present

## 2018-09-17 DIAGNOSIS — I10 Essential (primary) hypertension: Secondary | ICD-10-CM | POA: Diagnosis not present

## 2018-09-17 DIAGNOSIS — E1165 Type 2 diabetes mellitus with hyperglycemia: Secondary | ICD-10-CM | POA: Diagnosis not present

## 2018-09-17 DIAGNOSIS — Z6828 Body mass index (BMI) 28.0-28.9, adult: Secondary | ICD-10-CM | POA: Diagnosis not present

## 2018-09-18 ENCOUNTER — Other Ambulatory Visit: Payer: Self-pay | Admitting: *Deleted

## 2018-09-18 ENCOUNTER — Encounter: Payer: Self-pay | Admitting: Podiatry

## 2018-09-18 ENCOUNTER — Other Ambulatory Visit: Payer: Self-pay

## 2018-09-18 ENCOUNTER — Ambulatory Visit (INDEPENDENT_AMBULATORY_CARE_PROVIDER_SITE_OTHER): Payer: Medicare Other | Admitting: Podiatry

## 2018-09-18 ENCOUNTER — Telehealth: Payer: Self-pay | Admitting: Neurology

## 2018-09-18 VITALS — Temp 97.0°F

## 2018-09-18 DIAGNOSIS — E119 Type 2 diabetes mellitus without complications: Secondary | ICD-10-CM

## 2018-09-18 DIAGNOSIS — M79676 Pain in unspecified toe(s): Secondary | ICD-10-CM | POA: Diagnosis not present

## 2018-09-18 DIAGNOSIS — B351 Tinea unguium: Secondary | ICD-10-CM | POA: Diagnosis not present

## 2018-09-18 MED ORDER — LAMOTRIGINE 100 MG PO TABS: ORAL_TABLET | ORAL | 4 refills | Status: DC

## 2018-09-18 NOTE — Progress Notes (Signed)
Patient ID: Charles [name], male   DOB: August 12, 1955, 63 y.o.   MRN: [id number] Complaint:  Visit Type: Patient returns to my office for continued preventative foot care services. Complaint: Patient states" my nails have grown long and thick and become painful to walk and wear shoes" Patient has been diagnosed with DM with no foot complications. The patient presents for preventative foot care services. No changes to ROS  Podiatric Exam: Vascular: dorsalis pedis and posterior tibial pulses are palpable bilateral. Capillary return is immediate. Temperature gradient is WNL. Skin turgor WNL  Sensorium: Normal Semmes Weinstein monofilament test. Normal tactile sensation bilaterally. Nail Exam: Pt has thick disfigured discolored nails with subungual debris noted bilateral entire nail hallux  Ulcer Exam: There is no evidence of ulcer or pre-ulcerative changes or infection. Orthopedic Exam: Muscle tone and strength are WNL. No limitations in general ROM. No crepitus or effusions noted. Foot type and digits show no abnormalities. Bony prominences are unremarkable. Skin: No Porokeratosis. No infection or ulcers  Diagnosis:  Onychomycosis, , Pain in right toe, pain in left toes  Treatment & Plan Procedures and Treatment: Consent by patient was obtained for treatment procedures. The patient understood the discussion of treatment and procedures well. All questions were answered thoroughly reviewed. Debridement of mycotic and hypertrophic toenails, 1 through 5 bilateral and clearing of subungual debris. No ulceration, no infection noted.  Return Visit-Office Procedure: Patient instructed to return to the office for a follow up visit 3 months for continued evaluation and treatment.   Gardiner Barefoot DPM

## 2018-09-18 NOTE — Telephone Encounter (Signed)
Pt brother called stating when pt was in the office last Weds. The 22nd Dr Krista Blue was supposed to have called in a prescription, brother has checked with     [facility] [phone]    And they don't have anything for pt

## 2018-09-18 NOTE — Telephone Encounter (Signed)
New lamotrigine 100mg , two tablets BID sent to [facility].

## 2018-09-21 DIAGNOSIS — Z299 Encounter for prophylactic measures, unspecified: Secondary | ICD-10-CM | POA: Diagnosis not present

## 2018-09-21 DIAGNOSIS — E7800 Pure hypercholesterolemia, unspecified: Secondary | ICD-10-CM | POA: Diagnosis not present

## 2018-09-21 DIAGNOSIS — I10 Essential (primary) hypertension: Secondary | ICD-10-CM | POA: Diagnosis not present

## 2018-09-21 DIAGNOSIS — Z6828 Body mass index (BMI) 28.0-28.9, adult: Secondary | ICD-10-CM | POA: Diagnosis not present

## 2018-09-21 DIAGNOSIS — I739 Peripheral vascular disease, unspecified: Secondary | ICD-10-CM | POA: Diagnosis not present

## 2018-09-21 DIAGNOSIS — E1165 Type 2 diabetes mellitus with hyperglycemia: Secondary | ICD-10-CM | POA: Diagnosis not present

## 2018-09-25 DIAGNOSIS — F332 Major depressive disorder, recurrent severe without psychotic features: Secondary | ICD-10-CM | POA: Diagnosis not present

## 2018-09-27 DIAGNOSIS — H0102B Squamous blepharitis left eye, upper and lower eyelids: Secondary | ICD-10-CM | POA: Diagnosis not present

## 2018-09-27 DIAGNOSIS — H338 Other retinal detachments: Secondary | ICD-10-CM | POA: Diagnosis not present

## 2018-09-27 DIAGNOSIS — H16143 Punctate keratitis, bilateral: Secondary | ICD-10-CM | POA: Diagnosis not present

## 2018-09-27 DIAGNOSIS — H40021 Open angle with borderline findings, high risk, right eye: Secondary | ICD-10-CM | POA: Diagnosis not present

## 2018-09-27 DIAGNOSIS — E119 Type 2 diabetes mellitus without complications: Secondary | ICD-10-CM | POA: Diagnosis not present

## 2018-09-27 DIAGNOSIS — H0102A Squamous blepharitis right eye, upper and lower eyelids: Secondary | ICD-10-CM | POA: Diagnosis not present

## 2018-09-28 ENCOUNTER — Encounter (INDEPENDENT_AMBULATORY_CARE_PROVIDER_SITE_OTHER): Payer: Self-pay | Admitting: Ophthalmology

## 2018-09-28 ENCOUNTER — Ambulatory Visit (INDEPENDENT_AMBULATORY_CARE_PROVIDER_SITE_OTHER): Payer: Medicare Other | Admitting: Ophthalmology

## 2018-09-28 ENCOUNTER — Other Ambulatory Visit: Payer: Self-pay

## 2018-09-28 DIAGNOSIS — E119 Type 2 diabetes mellitus without complications: Secondary | ICD-10-CM

## 2018-09-28 DIAGNOSIS — H913 Deaf nonspeaking, not elsewhere classified: Secondary | ICD-10-CM

## 2018-09-28 DIAGNOSIS — H4312 Vitreous hemorrhage, left eye: Secondary | ICD-10-CM

## 2018-09-28 DIAGNOSIS — H25813 Combined forms of age-related cataract, bilateral: Secondary | ICD-10-CM | POA: Diagnosis not present

## 2018-09-28 DIAGNOSIS — H3581 Retinal edema: Secondary | ICD-10-CM
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
## 2018-10-01 ENCOUNTER — Other Ambulatory Visit (HOSPITAL_COMMUNITY)
Admission: RE | Admit: 2018-10-01 | Discharge: 2018-10-01 | Disposition: A | Discharge status: Home / Self Care | Payer: Medicare Other | Source: Ambulatory Visit | Attending: Ophthalmology | Admitting: Ophthalmology

## 2018-10-01 DIAGNOSIS — Z01812 Encounter for preprocedural laboratory examination: Secondary | ICD-10-CM | POA: Insufficient documentation

## 2018-10-01 DIAGNOSIS — Z20828 Contact with and (suspected) exposure to other viral communicable diseases: Secondary | ICD-10-CM | POA: Insufficient documentation

## 2018-10-01 LAB — SARS CORONAVIRUS 2 (TAT 6-24 HRS): SARS Coronavirus 2: NEGATIVE
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
## 2018-10-03 ENCOUNTER — Other Ambulatory Visit: Payer: Self-pay

## 2018-10-03 ENCOUNTER — Encounter (HOSPITAL_COMMUNITY): Payer: Self-pay | Admitting: *Deleted

## 2018-10-03 NOTE — Anesthesia Preprocedure Evaluation (Addendum)
Anesthesia Evaluation  Patient identified by MRN, date of birth, ID band Patient awake    Reviewed: Allergy & Precautions, NPO status , Patient's Chart, lab work & pertinent test results  Airway Mallampati: II  TM Distance: >3 FB Neck ROM: Full    Dental  (+) Teeth Intact, Dental Advisory Given   Pulmonary former smoker,    breath sounds clear to auscultation       Cardiovascular hypertension,  Rhythm:Regular Rate:Normal     Neuro/Psych    GI/Hepatic   Endo/Other  diabetes  Renal/GU      Musculoskeletal   Abdominal   Peds  Hematology   Anesthesia Other Findings   Reproductive/Obstetrics                            Anesthesia Physical Anesthesia Plan  ASA: III  Anesthesia Plan: General   Post-op Pain Management:    Induction: Intravenous  PONV Risk Score and Plan: Ondansetron  Airway Management Planned: Oral ETT  Additional Equipment:   Intra-op Plan:   Post-operative Plan: Extubation in OR  Informed Consent: I have reviewed the patients History and Physical, chart, labs and discussed the procedure including the risks, benefits and alternatives for the proposed anesthesia with the patient or authorized representative who has indicated his/her understanding and acceptance.     Dental advisory given  Plan Discussed with: CRNA and Anesthesiologist  Anesthesia Plan Comments: (Pt deaf, uses sign language interpreter.   History of seizures followed by Dr. Krista Blue, on lamotrigine. History of CVA with residual spastic left hemiparesis also followed by Dr. Krista Blue, being treated with Botox injections.  Hx of COPD/Pulmonary sarcoidosis. Last seen by Dr. Melvyn Novas 02/09/16. At that time Dr. Melvyn Novas stated pt was not limited by breathing from desired activities and no evidence of active sarcoidosis. Appears this is also followed by his PCP Dr. [name].  TTE 09/13/16: Study Conclusions  - Left  ventricle: The cavity size was normal. Wall thickness was   increased in a pattern of mild LVH. Systolic function was normal.   The estimated ejection fraction was in the range of 60% to 65%.   Wall motion was normal; there were no regional wall motion   abnormalities. Doppler parameters are consistent with abnormal   left ventricular relaxation (grade 1 diastolic dysfunction). - Aortic valve: Valve area (VTI): 2.67 cm^2. Valve area (Vmax):   2.67 cm^2. Valve area (Vmean): 2.64 cm^2. - Technically adequate study.)     Anesthesia Quick Evaluation

## 2018-10-03 NOTE — Progress Notes (Signed)
Patient's Brother Charles [name] denies that patient has shortness of breath, fever, cough and chest pain.  Patient is deaf.  His Brother Charles [name] [phone]) is his legal POA and lives with him. Patient's medical/surgical information was provided by his brother. Interpreter was requested for DOS.  PCP - Dr Brigitte Pulse in [facility] Cardiologist - Denies Neurology - Dr Krista Blue  Chest x-ray - 06/18/18 1 view EKG - 06/18/18 Stress Test - Denies ECHO - 09/13/16 Cardiac Cath - Denies  Fasting Blood Sugar - Does not check blood sugar Checks Blood Sugar 0 times a day  Brother Charles [name] states the patient had seen Dr Brigitte Pulse in [place] on 09/10/18 and that patient no longer needed to be on medication.  Patient's Brother Charles [name] states that "Dr Brigitte Pulse said diabetes was excellent over the last 3 months and he would continue to monitor the patient".   Blood Thinner Instructions: Plavix last dose 09/29/18 per Brother Charles [name], [place].  Anesthesia review: Yes, James, PA  STOP taking any Aspirin (unless otherwise instructed by your surgeon), Aleve, Naproxen, Ibuprofen, Motrin, Advil, Goody's, BC's, all herbal medications, fish oil, and all vitamins.   Coronavirus Screening Have you or your Brother experienced the following symptoms:  Cough yes/no: No Fever (>100.95F)  yes/no: No Runny nose yes/no: No Sore throat yes/no: No Difficulty breathing/shortness of breath  yes/no: No  Have you or your Brother traveled in the last 14 days and where? yes/no: No
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
## 2018-10-04 ENCOUNTER — Ambulatory Visit (HOSPITAL_COMMUNITY)
Admission: RE | Admit: 2018-10-04 | Discharge: 2018-10-04 | Disposition: A | Discharge status: Home / Self Care | Payer: Medicare Other | Attending: Ophthalmology | Admitting: Ophthalmology

## 2018-10-04 ENCOUNTER — Ambulatory Visit (HOSPITAL_COMMUNITY): Payer: Medicare Other | Admitting: Physician Assistant

## 2018-10-04 ENCOUNTER — Encounter (HOSPITAL_COMMUNITY): Payer: Self-pay

## 2018-10-04 ENCOUNTER — Encounter (HOSPITAL_COMMUNITY)
Admission: RE | Disposition: A | Discharge status: Home / Self Care | Payer: Self-pay | Source: Home / Self Care | Attending: Ophthalmology

## 2018-10-04 DIAGNOSIS — Z91041 Radiographic dye allergy status: Secondary | ICD-10-CM | POA: Insufficient documentation

## 2018-10-04 DIAGNOSIS — Z888 Allergy status to other drugs, medicaments and biological substances status: Secondary | ICD-10-CM | POA: Diagnosis not present

## 2018-10-04 DIAGNOSIS — H3342 Traction detachment of retina, left eye: Secondary | ICD-10-CM | POA: Insufficient documentation

## 2018-10-04 DIAGNOSIS — G43909 Migraine, unspecified, not intractable, without status migrainosus: Secondary | ICD-10-CM | POA: Insufficient documentation

## 2018-10-04 DIAGNOSIS — K219 Gastro-esophageal reflux disease without esophagitis: Secondary | ICD-10-CM | POA: Diagnosis not present

## 2018-10-04 DIAGNOSIS — I10 Essential (primary) hypertension: Secondary | ICD-10-CM | POA: Diagnosis not present

## 2018-10-04 DIAGNOSIS — Z885 Allergy status to narcotic agent status: Secondary | ICD-10-CM | POA: Insufficient documentation

## 2018-10-04 DIAGNOSIS — Z83511 Family history of glaucoma: Secondary | ICD-10-CM | POA: Diagnosis not present

## 2018-10-04 DIAGNOSIS — I69359 Hemiplegia and hemiparesis following cerebral infarction affecting unspecified side: Secondary | ICD-10-CM | POA: Insufficient documentation

## 2018-10-04 DIAGNOSIS — E11319 Type 2 diabetes mellitus with unspecified diabetic retinopathy without macular edema: Secondary | ICD-10-CM | POA: Diagnosis not present

## 2018-10-04 DIAGNOSIS — D4981 Neoplasm of unspecified behavior of retina and choroid: Secondary | ICD-10-CM | POA: Insufficient documentation

## 2018-10-04 DIAGNOSIS — E1151 Type 2 diabetes mellitus with diabetic peripheral angiopathy without gangrene: Secondary | ICD-10-CM | POA: Insufficient documentation

## 2018-10-04 DIAGNOSIS — Z87891 Personal history of nicotine dependence: Secondary | ICD-10-CM | POA: Insufficient documentation

## 2018-10-04 DIAGNOSIS — Z833 Family history of diabetes mellitus: Secondary | ICD-10-CM | POA: Diagnosis not present

## 2018-10-04 DIAGNOSIS — D869 Sarcoidosis, unspecified: Secondary | ICD-10-CM | POA: Insufficient documentation

## 2018-10-04 DIAGNOSIS — H4312 Vitreous hemorrhage, left eye: Secondary | ICD-10-CM | POA: Diagnosis not present

## 2018-10-04 DIAGNOSIS — Z981 Arthrodesis status: Secondary | ICD-10-CM | POA: Insufficient documentation

## 2018-10-04 DIAGNOSIS — E782 Mixed hyperlipidemia: Secondary | ICD-10-CM | POA: Insufficient documentation

## 2018-10-04 DIAGNOSIS — D3122 Benign neoplasm of left retina: Secondary | ICD-10-CM | POA: Diagnosis not present

## 2018-10-04 DIAGNOSIS — D61818 Other pancytopenia: Secondary | ICD-10-CM | POA: Insufficient documentation

## 2018-10-04 DIAGNOSIS — Z820 Family history of epilepsy and other diseases of the nervous system: Secondary | ICD-10-CM | POA: Insufficient documentation

## 2018-10-04 DIAGNOSIS — J45909 Unspecified asthma, uncomplicated: Secondary | ICD-10-CM | POA: Insufficient documentation

## 2018-10-04 DIAGNOSIS — H3322 Serous retinal detachment, left eye: Secondary | ICD-10-CM | POA: Diagnosis not present

## 2018-10-04 DIAGNOSIS — Z8249 Family history of ischemic heart disease and other diseases of the circulatory system: Secondary | ICD-10-CM | POA: Insufficient documentation

## 2018-10-04 DIAGNOSIS — H338 Other retinal detachments: Secondary | ICD-10-CM | POA: Diagnosis not present

## 2018-10-04 DIAGNOSIS — Z8669 Personal history of other diseases of the nervous system and sense organs: Secondary | ICD-10-CM | POA: Insufficient documentation

## 2018-10-04 DIAGNOSIS — H9190 Unspecified hearing loss, unspecified ear: Secondary | ICD-10-CM | POA: Insufficient documentation

## 2018-10-04 DIAGNOSIS — Z91013 Allergy to seafood: Secondary | ICD-10-CM | POA: Insufficient documentation

## 2018-10-04 HISTORY — DX: Depression, unspecified: F32.A

## 2018-10-04 HISTORY — PX: PARS PLANA VITRECTOMY: SHX2166

## 2018-10-04 HISTORY — PX: INJECTION OF SILICONE OIL: SHX6422

## 2018-10-04 HISTORY — PX: LASER PHOTO ABLATION: SHX5942

## 2018-10-04 HISTORY — DX: Anxiety disorder, unspecified: F41.9

## 2018-10-04 HISTORY — PX: AIR/FLUID EXCHANGE: SHX6494

## 2018-10-04 LAB — BASIC METABOLIC PANEL
Anion gap: 12 (ref 5–15)
BUN: 12 mg/dL (ref 8–23)
CO2: 24 mmol/L (ref 22–32)
Calcium: 8.9 mg/dL (ref 8.9–10.3)
Chloride: 102 mmol/L (ref 98–111)
Creatinine, Ser: 1.02 mg/dL (ref 0.61–1.24)
GFR calc Af Amer: >60 mL/min (ref >60)
GFR calc non Af Amer: >60 mL/min (ref >60)
Glucose, Bld: 106 mg/dL — ABNORMAL HIGH (ref 70–99)
Potassium: 4.2 mmol/L (ref 3.5–5.1)
Sodium: 138 mmol/L (ref 135–145)

## 2018-10-04 LAB — CBC
HCT: 40.2 % (ref 39.0–52.0)
Hemoglobin: 13.6 g/dL (ref 13.0–17.0)
MCH: 30.6 pg (ref 26.0–34.0)
MCHC: 33.8 g/dL (ref 30.0–36.0)
MCV: 90.3 fL (ref 80.0–100.0)
Platelets: 103 10*3/uL — ABNORMAL LOW (ref 150–400)
RBC: 4.45 MIL/uL (ref 4.22–5.81)
RDW: 11.9 % (ref 11.5–15.5)
WBC: 4.6 10*3/uL (ref 4.0–10.5)
nRBC: 0 % (ref 0.0–0.2)

## 2018-10-04 LAB — GLUCOSE, CAPILLARY: Glucose-Capillary: 161 mg/dL — ABNORMAL HIGH (ref 70–99)

## 2018-10-04 SURGERY — PARS PLANA VITRECTOMY WITH 25 GAUGE
Anesthesia: General | Site: Eye | Laterality: Left

## 2018-10-04 MED ORDER — ATROPINE SULFATE 1 % OP SOLN
OPHTHALMIC | Status: AC
  Filled 2018-10-04: qty 5

## 2018-10-04 MED ORDER — DEXTROSE 5 % IV SOLN
INTRAVENOUS | Status: AC
  Filled 2018-10-04: qty 100

## 2018-10-04 MED ORDER — DEXAMETHASONE SODIUM PHOSPHATE 10 MG/ML IJ SOLN
INTRAMUSCULAR | Status: AC
  Filled 2018-10-04: qty 1

## 2018-10-04 MED ORDER — LIDOCAINE 2% (20 MG/ML) 5 ML SYRINGE
INTRAMUSCULAR | Status: DC | PRN
  Administered 2018-10-04: 60 mg via INTRAVENOUS

## 2018-10-04 MED ORDER — POLYMYXIN B SULFATE 500000 UNITS IJ SOLR
INTRAMUSCULAR | Status: AC
  Filled 2018-10-04: qty 500000

## 2018-10-04 MED ORDER — ESMOLOL HCL 100 MG/10ML IV SOLN
INTRAVENOUS | Status: DC | PRN
  Administered 2018-10-04 (×2): 20 mg via INTRAVENOUS

## 2018-10-04 MED ORDER — BSS PLUS IO SOLN
INTRAOCULAR | Status: AC
  Filled 2018-10-04: qty 500

## 2018-10-04 MED ORDER — ALBUTEROL SULFATE (2.5 MG/3ML) 0.083% IN NEBU
INHALATION_SOLUTION | RESPIRATORY_TRACT | Status: AC
  Filled 2018-10-04: qty 3

## 2018-10-04 MED ORDER — MIDAZOLAM HCL 5 MG/5ML IJ SOLN
INTRAMUSCULAR | Status: DC | PRN
  Administered 2018-10-04: 2 mg via INTRAVENOUS

## 2018-10-04 MED ORDER — TRIAMCINOLONE ACETONIDE 40 MG/ML IJ SUSP
INTRAMUSCULAR | Status: AC
  Filled 2018-10-04: qty 5

## 2018-10-04 MED ORDER — LACTATED RINGERS IV SOLN
INTRAVENOUS | Status: DC | PRN
  Administered 2018-10-04: 17:00:00 via INTRAVENOUS

## 2018-10-04 MED ORDER — EPHEDRINE SULFATE-NACL 50-0.9 MG/10ML-% IV SOSY
PREFILLED_SYRINGE | INTRAVENOUS | Status: DC | PRN
  Administered 2018-10-04: 10 mg via INTRAVENOUS
  Administered 2018-10-04: 15 mg via INTRAVENOUS

## 2018-10-04 MED ORDER — PHENYLEPHRINE HCL 10 % OP SOLN
1.0000 [drp] | OPHTHALMIC | Status: AC | PRN
  Administered 2018-10-04 (×3): 1 [drp] via OPHTHALMIC
  Filled 2018-10-04: qty 5

## 2018-10-04 MED ORDER — SODIUM CHLORIDE 0.9 % IV SOLN
INTRAVENOUS | Status: DC
  Administered 2018-10-04: 14:00:00 via INTRAVENOUS

## 2018-10-04 MED ORDER — EPHEDRINE 5 MG/ML INJ
INTRAVENOUS | Status: AC
  Filled 2018-10-04: qty 10

## 2018-10-04 MED ORDER — INDOCYANINE GREEN 25 MG IV SOLR
INTRAVENOUS | Status: AC
  Filled 2018-10-04: qty 25

## 2018-10-04 MED ORDER — DEXAMETHASONE SODIUM PHOSPHATE 10 MG/ML IJ SOLN
INTRAMUSCULAR | Status: DC | PRN
  Administered 2018-10-04: 5 mg via INTRAVENOUS

## 2018-10-04 MED ORDER — TRIAMCINOLONE ACETONIDE 40 MG/ML IJ SUSP
INTRAMUSCULAR | Status: DC | PRN
  Administered 2018-10-04: 80 mg

## 2018-10-04 MED ORDER — ALBUTEROL SULFATE (2.5 MG/3ML) 0.083% IN NEBU
2.5000 mg | INHALATION_SOLUTION | Freq: Once | RESPIRATORY_TRACT | Status: AC
  Administered 2018-10-04: 2.5 mg via RESPIRATORY_TRACT

## 2018-10-04 MED ORDER — SUCCINYLCHOLINE CHLORIDE 200 MG/10ML IV SOSY
PREFILLED_SYRINGE | INTRAVENOUS | Status: DC | PRN
  Administered 2018-10-04: 140 mg via INTRAVENOUS

## 2018-10-04 MED ORDER — ALBUTEROL SULFATE HFA 108 (90 BASE) MCG/ACT IN AERS
INHALATION_SPRAY | RESPIRATORY_TRACT | Status: DC | PRN
  Administered 2018-10-04 (×2): 2 via RESPIRATORY_TRACT

## 2018-10-04 MED ORDER — EPINEPHRINE PF 1 MG/ML IJ SOLN
INTRAMUSCULAR | Status: AC
  Filled 2018-10-04: qty 1

## 2018-10-04 MED ORDER — NALOXONE HCL 0.4 MG/ML IJ SOLN
INTRAMUSCULAR | Status: DC | PRN
  Administered 2018-10-04 (×2): .08 mg via INTRAVENOUS

## 2018-10-04 MED ORDER — 0.9 % SODIUM CHLORIDE (POUR BTL) OPTIME
TOPICAL | Status: DC | PRN
  Administered 2018-10-04: 200 mL

## 2018-10-04 MED ORDER — GATIFLOXACIN 0.5 % OP SOLN OPTIME - NO CHARGE
OPHTHALMIC | Status: DC | PRN
  Administered 2018-10-04: 1 [drp] via OPHTHALMIC

## 2018-10-04 MED ORDER — BACITRACIN-POLYMYXIN B 500-10000 UNIT/GM OP OINT
TOPICAL_OINTMENT | OPHTHALMIC | Status: DC | PRN
  Administered 2018-10-04: 1 via OPHTHALMIC

## 2018-10-04 MED ORDER — PREDNISOLONE ACETATE 1 % OP SUSP
OPHTHALMIC | Status: AC
  Filled 2018-10-04: qty 5

## 2018-10-04 MED ORDER — TOBRAMYCIN-DEXAMETHASONE 0.3-0.1 % OP OINT
TOPICAL_OINTMENT | OPHTHALMIC | Status: AC
  Filled 2018-10-04: qty 3.5

## 2018-10-04 MED ORDER — BRIMONIDINE TARTRATE 0.2 % OP SOLN
OPHTHALMIC | Status: DC | PRN
  Administered 2018-10-04: 1 [drp] via OPHTHALMIC

## 2018-10-04 MED ORDER — DORZOLAMIDE HCL-TIMOLOL MAL 2-0.5 % OP SOLN
OPHTHALMIC | Status: AC
  Filled 2018-10-04: qty 10

## 2018-10-04 MED ORDER — ATROPINE SULFATE 1 % OP SOLN
OPHTHALMIC | Status: DC | PRN
  Administered 2018-10-04: 1 [drp] via OPHTHALMIC

## 2018-10-04 MED ORDER — FENTANYL CITRATE (PF) 250 MCG/5ML IJ SOLN
INTRAMUSCULAR | Status: AC
  Filled 2018-10-04: qty 5

## 2018-10-04 MED ORDER — CEFTAZIDIME 1 G IJ SOLR
INTRAMUSCULAR | Status: AC
  Filled 2018-10-04: qty 1

## 2018-10-04 MED ORDER — ACETAZOLAMIDE SODIUM 500 MG IJ SOLR
INTRAMUSCULAR | Status: AC
  Filled 2018-10-04: qty 500

## 2018-10-04 MED ORDER — BALANCED SALT IO SOLN
INTRAOCULAR | Status: DC | PRN
  Administered 2018-10-04 (×2): 15 mL via INTRAOCULAR

## 2018-10-04 MED ORDER — DORZOLAMIDE HCL-TIMOLOL MAL 2-0.5 % OP SOLN
OPHTHALMIC | Status: DC | PRN
  Administered 2018-10-04: 1 [drp] via OPHTHALMIC

## 2018-10-04 MED ORDER — BACITRACIN-POLYMYXIN B 500-10000 UNIT/GM OP OINT
TOPICAL_OINTMENT | OPHTHALMIC | Status: AC
  Filled 2018-10-04: qty 3.5

## 2018-10-04 MED ORDER — BSS IO SOLN
INTRAOCULAR | Status: AC
  Filled 2018-10-04: qty 15

## 2018-10-04 MED ORDER — TROPICAMIDE 1 % OP SOLN
1.0000 [drp] | OPHTHALMIC | Status: AC | PRN
  Administered 2018-10-04 (×3): 1 [drp] via OPHTHALMIC
  Filled 2018-10-04: qty 15

## 2018-10-04 MED ORDER — ATROPINE SULFATE 1 % OP SOLN
1.0000 [drp] | OPHTHALMIC | Status: AC | PRN
  Administered 2018-10-04 (×3): 1 [drp] via OPHTHALMIC
  Filled 2018-10-04: qty 2

## 2018-10-04 MED ORDER — PROPOFOL 10 MG/ML IV BOLUS
INTRAVENOUS | Status: DC | PRN
  Administered 2018-10-04: 140 mg via INTRAVENOUS

## 2018-10-04 MED ORDER — STERILE WATER FOR INJECTION IJ SOLN
INTRAMUSCULAR | Status: DC | PRN
  Administered 2018-10-04: 20 mL

## 2018-10-04 MED ORDER — PROPARACAINE HCL 0.5 % OP SOLN
1.0000 [drp] | OPHTHALMIC | Status: AC | PRN
  Administered 2018-10-04 (×3): 1 [drp] via OPHTHALMIC
  Filled 2018-10-04: qty 15

## 2018-10-04 MED ORDER — BUPIVACAINE HCL (PF) 0.75 % IJ SOLN
INTRAMUSCULAR | Status: AC
  Filled 2018-10-04: qty 10

## 2018-10-04 MED ORDER — MIDAZOLAM HCL 2 MG/2ML IJ SOLN
INTRAMUSCULAR | Status: AC
  Filled 2018-10-04: qty 2

## 2018-10-04 MED ORDER — LIDOCAINE HCL (PF) 2 % IJ SOLN
INTRAMUSCULAR | Status: AC
  Filled 2018-10-04: qty 10

## 2018-10-04 MED ORDER — LABETALOL HCL 5 MG/ML IV SOLN
INTRAVENOUS | Status: DC | PRN
  Administered 2018-10-04 (×2): 10 mg via INTRAVENOUS

## 2018-10-04 MED ORDER — ALBUTEROL SULFATE (2.5 MG/3ML) 0.083% IN NEBU: 2.5000 mg | INHALATION_SOLUTION | Freq: Four times a day (QID) | RESPIRATORY_TRACT | Status: DC | PRN

## 2018-10-04 MED ORDER — SODIUM CHLORIDE 0.9 % IV SOLN
INTRAVENOUS | Status: DC | PRN
  Administered 2018-10-04: 16:00:00 25 ug/min via INTRAVENOUS

## 2018-10-04 MED ORDER — ROCURONIUM BROMIDE 10 MG/ML (PF) SYRINGE
PREFILLED_SYRINGE | INTRAVENOUS | Status: DC | PRN
  Administered 2018-10-04: 50 mg via INTRAVENOUS

## 2018-10-04 MED ORDER — BRIMONIDINE TARTRATE 0.2 % OP SOLN
OPHTHALMIC | Status: AC
  Filled 2018-10-04: qty 5

## 2018-10-04 MED ORDER — STERILE WATER FOR INJECTION IJ SOLN
INTRAMUSCULAR | Status: DC | PRN
  Administered 2018-10-04: 200 mL

## 2018-10-04 MED ORDER — PREDNISOLONE ACETATE 1 % OP SUSP
OPHTHALMIC | Status: DC | PRN
  Administered 2018-10-04: 1 [drp] via OPHTHALMIC

## 2018-10-04 MED ORDER — LIDOCAINE HCL (PF) 4 % IJ SOLN
INTRAMUSCULAR | Status: AC
  Filled 2018-10-04: qty 5

## 2018-10-04 MED ORDER — FENTANYL CITRATE (PF) 100 MCG/2ML IJ SOLN: 25.0000 ug | INTRAMUSCULAR | Status: DC | PRN

## 2018-10-04 MED ORDER — STERILE WATER FOR INJECTION IJ SOLN
INTRAMUSCULAR | Status: AC
  Filled 2018-10-04: qty 10

## 2018-10-04 MED ORDER — SODIUM CHLORIDE (PF) 0.9 % IJ SOLN
INTRAMUSCULAR | Status: AC
  Filled 2018-10-04: qty 10

## 2018-10-04 MED ORDER — CARBACHOL 0.01 % IO SOLN
INTRAOCULAR | Status: AC
  Filled 2018-10-04: qty 1.5

## 2018-10-04 MED ORDER — FENTANYL CITRATE (PF) 250 MCG/5ML IJ SOLN
INTRAMUSCULAR | Status: DC | PRN
  Administered 2018-10-04 (×2): 50 ug via INTRAVENOUS

## 2018-10-04 MED ORDER — SUGAMMADEX SODIUM 200 MG/2ML IV SOLN
INTRAVENOUS | Status: DC | PRN
  Administered 2018-10-04: 160 mg via INTRAVENOUS

## 2018-10-04 MED ORDER — EPINEPHRINE PF 1 MG/ML IJ SOLN
INTRAOCULAR | Status: DC | PRN
  Administered 2018-10-04 (×2): 500 mL

## 2018-10-04 MED ORDER — NA CHONDROIT SULF-NA HYALURON 40-30 MG/ML IO SOLN
INTRAOCULAR | Status: AC
  Filled 2018-10-04: qty 1

## 2018-10-04 MED ORDER — ONDANSETRON HCL 4 MG/2ML IJ SOLN
INTRAMUSCULAR | Status: DC | PRN
  Administered 2018-10-04: 4 mg via INTRAVENOUS

## 2018-10-04 MED ORDER — NA CHONDROIT SULF-NA HYALURON 40-30 MG/ML IO SOLN
INTRAOCULAR | Status: DC | PRN
  Administered 2018-10-04: 0.5 mL via INTRAOCULAR

## 2018-10-04 MED ORDER — GATIFLOXACIN 0.5 % OP SOLN
OPHTHALMIC | Status: AC
  Filled 2018-10-04: qty 2.5

## 2018-10-04 SURGICAL SUPPLY — 52 items
APL SWBSTK 6 STRL LF DISP (MISCELLANEOUS) ×2
APPLICATOR COTTON TIP 6 STRL (MISCELLANEOUS) ×4 IMPLANT
APPLICATOR COTTON TIP 6IN STRL (MISCELLANEOUS) ×6
BNDG EYE OVAL (GAUZE/BANDAGES/DRESSINGS) ×3 IMPLANT
CABLE BIPOLOR RESECTION CORD (MISCELLANEOUS) ×3 IMPLANT
CANNULA FLEX TIP 25G (CANNULA) ×3 IMPLANT
CLOSURE STERI-STRIP 1/2X4 (GAUZE/BANDAGES/DRESSINGS) ×1
CLSR STERI-STRIP ANTIMIC 1/2X4 (GAUZE/BANDAGES/DRESSINGS) ×2 IMPLANT
COVER WAND RF STERILE (DRAPES) ×3 IMPLANT
DRAPE MICROSCOPE LEICA 46X105 (MISCELLANEOUS) ×3 IMPLANT
DRAPE OPHTHALMIC 77X100 STRL (CUSTOM PROCEDURE TRAY) ×3 IMPLANT
FILTER BLUE MILLIPORE (MISCELLANEOUS) IMPLANT
FORCEPS GRIESHABER ILM 25G A (INSTRUMENTS) IMPLANT
GAS AUTO FILL CONSTEL (OPHTHALMIC)
GAS AUTO FILL CONSTELLATION (OPHTHALMIC) IMPLANT
GAUZE SPONGE 4X4 12PLY STRL (GAUZE/BANDAGES/DRESSINGS) ×2 IMPLANT
GLOVE BIO SURGEON STRL SZ7.5 (GLOVE) ×6 IMPLANT
GLOVE BIOGEL M 7.0 STRL (GLOVE) ×3 IMPLANT
GOWN STRL REUS W/ TWL LRG LVL3 (GOWN DISPOSABLE) ×2 IMPLANT
GOWN STRL REUS W/ TWL XL LVL3 (GOWN DISPOSABLE) ×1 IMPLANT
GOWN STRL REUS W/TWL LRG LVL3 (GOWN DISPOSABLE) ×6
GOWN STRL REUS W/TWL XL LVL3 (GOWN DISPOSABLE) ×3
KIT BASIN OR (CUSTOM PROCEDURE TRAY) ×3 IMPLANT
KIT PERFLUORON PROCEDURE 5ML (MISCELLANEOUS) IMPLANT
LOOP FINESSE 25 GA (MISCELLANEOUS) IMPLANT
MICROPICK 25G (MISCELLANEOUS)
NDL 18GX1X1/2 (RX/OR ONLY) (NEEDLE) ×1 IMPLANT
NDL 25GX 5/8IN NON SAFETY (NEEDLE) ×3 IMPLANT
NDL HYPO 30X.5 LL (NEEDLE) ×2 IMPLANT
NEEDLE 18GX1X1/2 (RX/OR ONLY) (NEEDLE) ×12 IMPLANT
NEEDLE 25GX 5/8IN NON SAFETY (NEEDLE) ×9 IMPLANT
NEEDLE HYPO 30X.5 LL (NEEDLE) ×6 IMPLANT
NS IRRIG 1000ML POUR BTL (IV SOLUTION) ×3 IMPLANT
OIL SILICONE OPHTHALMIC 1000 (Ophthalmic Related) ×2 IMPLANT
PACK VITRECTOMY CUSTOM (CUSTOM PROCEDURE TRAY) ×3 IMPLANT
PAD ARMBOARD 7.5X6 YLW CONV (MISCELLANEOUS) ×6 IMPLANT
PAK PIK VITRECTOMY CVS 25GA (OPHTHALMIC) ×3 IMPLANT
PENCIL BIPOLAR 25GA STR DISP (OPHTHALMIC RELATED) ×1 IMPLANT
PICK MICROPICK 25G (MISCELLANEOUS) IMPLANT
PROBE ENDO DIATHERMY 25G (MISCELLANEOUS) ×2 IMPLANT
PROBE LASER ILLUM FLEX CVD 25G (OPHTHALMIC) ×2 IMPLANT
SET INJECTOR OIL FLUID CONSTEL (OPHTHALMIC) ×2 IMPLANT
SPONGE SURGIFOAM ABS GEL 12-7 (HEMOSTASIS) IMPLANT
SUT VICRYL 7 0 TG140 8 (SUTURE) ×3 IMPLANT
SYR 10ML LL (SYRINGE) ×3 IMPLANT
SYR 20ML LL LF (SYRINGE) ×3 IMPLANT
SYR 5ML LL (SYRINGE) ×1 IMPLANT
SYR BULB 3OZ (MISCELLANEOUS) ×3 IMPLANT
SYR TB 1ML LUER SLIP (SYRINGE) ×6 IMPLANT
TOWEL GREEN STERILE FF (TOWEL DISPOSABLE) ×3 IMPLANT
TUBING HIGH PRESS EXTEN 6IN (TUBING) ×3 IMPLANT
WATER STERILE IRR 1000ML POUR (IV SOLUTION) ×3 IMPLANT

## 2018-10-04 NOTE — Anesthesia Procedure Notes (Signed)
Procedure Name: Intubation Date/Time: 10/04/2018 3:21 PM Performed by: Imagene Riches, CRNA Pre-anesthesia Checklist: Patient identified, Emergency Drugs available, Suction available and Patient being monitored Patient Re-evaluated:Patient Re-evaluated prior to induction Oxygen Delivery Method: Circle System Utilized Preoxygenation: Pre-oxygenation with 100% oxygen Induction Type: IV induction Ventilation: Two handed mask ventilation required and Oral airway inserted - appropriate to patient size Laryngoscope Size: Glidescope and 3 Grade View: Grade I Tube type: Oral Tube size: 7.5 mm Number of attempts: 1 Airway Equipment and Method: Stylet and Oral airway Placement Confirmation: ETT inserted through vocal cords under direct vision,  positive ETCO2 and breath sounds checked- equal and bilateral Secured at: 22 cm Tube secured with: Tape Dental Injury: Teeth and Oropharynx as per pre-operative assessment  Comments: Patient has limited mouth opening, and very limited neck extension. Elected for glide scope based on airway assessment. Two handed mask with oral airway.

## 2018-10-04 NOTE — Anesthesia Postprocedure Evaluation (Signed)
Anesthesia Post Note  Patient: Charles [name]  Procedure(s) Performed: PARS PLANA VITRECTOMY WITH 25 GAUGE (Left Eye) Injection Of Silicone Oil (Left Eye) Laser Photo Ablation (Left Eye)     Patient location during evaluation: PACU Anesthesia Type: General Level of consciousness: awake Pain management: pain level controlled Respiratory status: spontaneous breathing Cardiovascular status: stable Postop Assessment: no apparent nausea or vomiting Anesthetic complications: no    Last Vitals:  Vitals:   10/04/18 1347 10/04/18 1435  BP: (!) 184/107 (!) 164/101  Pulse: 63   Resp:    Temp:    SpO2:      Last Pain:  Vitals:   10/04/18 1347  TempSrc:   PainSc: 0-No pain                 Roise Emert

## 2018-10-04 NOTE — Transfer of Care (Signed)
Immediate Anesthesia Transfer of Care Note  Patient: Charles [name]  Procedure(s) Performed: PARS PLANA VITRECTOMY WITH 25 GAUGE (Left Eye) Injection Of Silicone Oil (Left Eye) Laser Photo Ablation (Left Eye) Air/Fluid Exchange (Left Eye)  Patient Location: PACU  Anesthesia Type:General  Level of Consciousness: awake, drowsy and patient cooperative  Airway & Oxygen Therapy: Patient Spontanous Breathing and Patient connected to face mask oxygen  Post-op Assessment: Report given to RN and Post -op Vital signs reviewed and stable  Post vital signs: Reviewed and stable  Last Vitals:  Vitals Value Taken Time  BP 161/121 10/04/18 1912  Temp    Pulse 99 10/04/18 1916  Resp 23 10/04/18 1916  SpO2 96 % 10/04/18 1916  Vitals shown include unvalidated device data.  Last Pain:  Vitals:   10/04/18 1347  TempSrc:   PainSc: 0-No pain      Patients Stated Pain Goal: 4 (79/72/82 0601)  Complications: No apparent anesthesia complications  Interpreter at bedside. Expiratory wheezes noted.  PACU to give albuterol treatment per [facility] Gifford Shave).

## 2018-10-04 NOTE — Brief Op Note (Signed)
10/04/2018  6:27 PM  PATIENT:  Charles [name]  63 y.o. male  PRE-OPERATIVE DIAGNOSIS:  vitreous hemorrhage, left eye  POST-OPERATIVE DIAGNOSIS:  vitreous hemorrhage, left eye Retinal vascular tumor with retinal detachment, left eye  PROCEDURE:  Procedure(s): PARS PLANA VITRECTOMY WITH 25 GAUGE (Left) Injection Of Silicone Oil (Left) Laser Photo Ablation (Left) Air/Fluid Exchange (Left)  SURGEON:  Surgeon(s) and Role:    Bernarda Caffey, MD - Primary  ASSISTANTS: Ernest Mallick, Ophthalmic Assistant  ANESTHESIA:   general  EBL:  5 mL   BLOOD ADMINISTERED:none  DRAINS: none   LOCAL MEDICATIONS USED:  NONE  SPECIMEN:  No Specimen  DISPOSITION OF SPECIMEN:  N/A  COUNTS:  YES  TOURNIQUET:  * No tourniquets in log *  DICTATION: .Note written in EPIC  PLAN OF CARE: Discharge to home after PACU  PATIENT DISPOSITION:  PACU - hemodynamically stable.   Delay start of Pharmacological VTE agent (>24hrs) due to surgical blood loss or risk of bleeding: yes

## 2018-10-04 NOTE — Progress Notes (Signed)
Patient unable to void.  Bladder scan show 975ml of urine.  I & O cath performed per Dr. Gifford Shave with 900 cc clear, yellow urine return.  Will continue to monitor.

## 2018-10-04 NOTE — Progress Notes (Signed)
Charles [name] with CAPS interpretation service at bed side.

## 2018-10-04 NOTE — Interval H&P Note (Signed)
History and Physical Interval Note:  10/04/2018 2:50 PM  Charles [name]  has presented today for surgery, with the diagnosis of vitreous hemorrhage, left eye.  The various methods of treatment have been discussed with the patient and family. After consideration of risks, benefits and other options for treatment, the patient has consented to  Procedure(s): PARS PLANA VITRECTOMY WITH 25 GAUGE (Left) as a surgical intervention.  The patient's history has been reviewed, patient examined, no change in status, stable for surgery.  I have reviewed the patient's chart and labs.  Questions were answered to the patient's satisfaction.     Bernarda Caffey

## 2018-10-04 NOTE — Op Note (Signed)
Date of procedure: 8.13.20   Surgeon: Bernarda Caffey, MD, PhD  Assistant: Ernest Mallick, Ophthalmic Assistant   Pre-operative Diagnoses:  1. Vitreous hemorrhage, Left Eye   Post-operative diagnosis:  1. Vitreous hemorrhage, Left Eye 2. Retinal vascular tumor, Left Eye 3. Tractional retinal detachment, Left Eye   Anesthesia: GETA   Procedure: 1.  25 gauge pars plana vitrectomy, Left Eye  2.  Endolaser, Left Eye 3.  Fluid-air exchange, Left Eye 4.  Silicon oil injection, 1000 cs, Left Eye  Complications: none Estimated blood loss: minimal Specimens: none   Brief history:  The patient has a history of congenital deafness and decreased cognition. He presented with decreased vision in the affected left eye of unknown, but likely chronic duration, and on examination, was noted to have a dense, chronic vitreous hemorrhage, affecting activities of daily living. The risks, benefits, and alternatives were explained to the patient, including pain, bleeding, infection, loss of vision, double vision, droopy eyelids, and need for more surgeries.  Informed consent was obtained from the patient and placed in the chart.     Procedure: The patient was brought to the preoperative holding area where the correct eye was confirmed and marked.  The patient was then brought to the operating room where general endotracheal anesthesia was induced. A secondary time-out was performed to identify the correct patient, eye, procedure, and any allergies. The eye was prepped and draped in the usual sterile ophthalmic fashion followed by placement of a lid speculum.  A 25 gauge trocar was placed in the inferotemporal quadrant in a beveled fashion. A 4 mm infusion cannula was placed through this trocar. The infusion cannula was confirmed in the vitreous cavity with no incarceration of retina or choroid prior to turning it on. Two additional 25 gauge trocars were placed in the superonasal and superotemporal quadrants (2 and  10 oclock, respectively) in a similar beveled fashion. A standard three-port pars plana vitrectomy was performed using the light pipe, the cutter, and the BIOM viewing system. A thorough core and peripheral vitreous dissection was performed. Of note, there was a dense, ochre-colored vitreous hemorrhage obscuring the posterior pole. A posterior vitreous detachment was confirmed over the optic nerve with the assistance of kenalog. Once the posterior pole and macula became visible, the macula. Further dissection of the peripheral vitreous exposed a subretinal mass -- hematoma vs vascular tumor -- possible cavernous hemangioma -- along the distal inferotemporal arcades. Surrounding the mass was tractional fibrosis with retinal breaks and a localized retinal detachment. Using scleral depression, the blood stained vitreous base was carefully removed.      Traction was removed from all retinal breaks and a fluid-air exchanged was performed over the retinal breaks to drain the subretinal fluid and reattach the retina. Under air, barricade laser retinopexy was performed around the mass and the retinal breaks. After completion of these maneuvers, the posterior pole and peripheral retina were noted to be flat and the vitreous hemorrhage was cleared.     A complete air to silicon oil exchange was performed with 5956 centistoke silicon oil. The three trocars were then removed and sutured with 7-0 vicryl in an interrupted fashion.  The eye was confirmed to be at a physiologic level by digital palpation. Subconjunctival injections of Antibiotic and kenalog were administered. The lid speculum and drapes were removed. Drops of an antibiotic, anti-ocular hypertensives and steroid were given. The eye was patched and shielded. The patient tolerated the procedure well without any intraoperative or immediate postoperative complications. The  patient was taken to the recovery room in good condition to be discharged home. The patient  was instructed to maintain a strict face-down position and will be seen by Dr. Coralyn Pear tomorrow morning.

## 2018-10-04 NOTE — Progress Notes (Signed)
Difficult to get patient to lean forward with head down due to patient's deafness and limited cognitive abilities.  Interpreter explained importance of keeping head down.  Patient has difficulty extended neck due to prior neck surgery as well.  Able to get patient to hold head down at an approximate 45 degree angle at times.  Dr. Coralyn Pear contacted and said that head at 45 degrees will suffice.  Will continue to monitor.

## 2018-10-04 NOTE — Discharge Instructions (Signed)
POSTOPERATIVE INSTRUCTIONS  Your doctor has performed vitreoretinal surgery on you at [facility]. [place] eye patched and shielded until seen by Dr. Coralyn Pear 9 AM tomorrow in clinic - Do not use drops until return - [place] - Sleep with belly down or on right side, avoid laying flat on back.    - No strenuous bending, stooping or lifting.  - You may not drive until further notice.  - If your doctor used a gas bubble in your eye during the procedure he will advise you on postoperative positioning. If you have a gas bubble you will be wearing a green bracelet that was applied in the operating room. The green bracelet should stay on as long as the gas bubble is in your eye. While the gas bubble is present you should not fly in an airplane. If you require general anesthesia while the gas bubble is present you must notify your anesthesiologist that an intraocular gas bubble is present so he can take the appropriate precautions.  - Tylenol or any other over-the-counter pain reliever can be used according to your doctor. If more pain medicine is required, your doctor will have a prescription for you.  - You may read, go up and down stairs, and watch television.    Bernarda Caffey, M.D., Ph.D.

## 2018-10-05 ENCOUNTER — Ambulatory Visit (INDEPENDENT_AMBULATORY_CARE_PROVIDER_SITE_OTHER): Payer: Medicare Other | Admitting: Ophthalmology

## 2018-10-05 ENCOUNTER — Other Ambulatory Visit: Payer: Self-pay

## 2018-10-05 ENCOUNTER — Encounter (INDEPENDENT_AMBULATORY_CARE_PROVIDER_SITE_OTHER): Payer: Self-pay | Admitting: Ophthalmology

## 2018-10-05 DIAGNOSIS — H25813 Combined forms of age-related cataract, bilateral: Secondary | ICD-10-CM

## 2018-10-05 DIAGNOSIS — H4312 Vitreous hemorrhage, left eye: Secondary | ICD-10-CM

## 2018-10-05 DIAGNOSIS — H913 Deaf nonspeaking, not elsewhere classified: Secondary | ICD-10-CM

## 2018-10-05 DIAGNOSIS — E119 Type 2 diabetes mellitus without complications: Secondary | ICD-10-CM

## 2018-10-05 DIAGNOSIS — H3581 Retinal edema: Secondary | ICD-10-CM

## 2018-10-07 ENCOUNTER — Encounter (HOSPITAL_COMMUNITY): Payer: Self-pay | Admitting: Ophthalmology
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
## 2018-10-12 ENCOUNTER — Ambulatory Visit (INDEPENDENT_AMBULATORY_CARE_PROVIDER_SITE_OTHER): Payer: Medicare Other | Admitting: Ophthalmology

## 2018-10-12 ENCOUNTER — Other Ambulatory Visit: Payer: Self-pay

## 2018-10-12 ENCOUNTER — Encounter (INDEPENDENT_AMBULATORY_CARE_PROVIDER_SITE_OTHER): Payer: Self-pay | Admitting: Ophthalmology

## 2018-10-12 DIAGNOSIS — H3581 Retinal edema: Secondary | ICD-10-CM

## 2018-10-12 DIAGNOSIS — H913 Deaf nonspeaking, not elsewhere classified: Secondary | ICD-10-CM | POA: Diagnosis not present

## 2018-10-12 DIAGNOSIS — H3589 Other specified retinal disorders: Secondary | ICD-10-CM

## 2018-10-12 DIAGNOSIS — H25813 Combined forms of age-related cataract, bilateral: Secondary | ICD-10-CM

## 2018-10-12 DIAGNOSIS — E119 Type 2 diabetes mellitus without complications: Secondary | ICD-10-CM

## 2018-10-12 DIAGNOSIS — H4312 Vitreous hemorrhage, left eye: Secondary | ICD-10-CM | POA: Diagnosis not present

## 2018-10-12 MED ORDER — PREDNISOLONE ACETATE 1 % OP SUSP: 1.0000 [drp] | Freq: Four times a day (QID) | OPHTHALMIC | 0 refills | Status: DC
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
## 2018-10-16 ENCOUNTER — Ambulatory Visit (INDEPENDENT_AMBULATORY_CARE_PROVIDER_SITE_OTHER): Payer: Medicare Other | Admitting: Ophthalmology

## 2018-10-16 ENCOUNTER — Encounter (INDEPENDENT_AMBULATORY_CARE_PROVIDER_SITE_OTHER): Payer: Self-pay | Admitting: Ophthalmology

## 2018-10-16 ENCOUNTER — Other Ambulatory Visit: Payer: Self-pay

## 2018-10-16 DIAGNOSIS — E119 Type 2 diabetes mellitus without complications: Secondary | ICD-10-CM | POA: Diagnosis not present

## 2018-10-16 DIAGNOSIS — H4312 Vitreous hemorrhage, left eye: Secondary | ICD-10-CM

## 2018-10-16 DIAGNOSIS — H3581 Retinal edema: Secondary | ICD-10-CM

## 2018-10-16 DIAGNOSIS — H25813 Combined forms of age-related cataract, bilateral: Secondary | ICD-10-CM

## 2018-10-16 DIAGNOSIS — H913 Deaf nonspeaking, not elsewhere classified: Secondary | ICD-10-CM | POA: Diagnosis not present

## 2018-10-16 DIAGNOSIS — H3589 Other specified retinal disorders: Secondary | ICD-10-CM

## 2018-10-16 MED ORDER — PREDNISOLONE ACETATE 1 % OP SUSP: 1.0000 [drp] | Freq: Four times a day (QID) | OPHTHALMIC | 0 refills | Status: DC

## 2018-10-17 ENCOUNTER — Encounter (INDEPENDENT_AMBULATORY_CARE_PROVIDER_SITE_OTHER): Payer: Self-pay | Admitting: Ophthalmology

## 2018-10-17 DIAGNOSIS — F332 Major depressive disorder, recurrent severe without psychotic features: Secondary | ICD-10-CM | POA: Diagnosis not present

## 2018-10-22 DIAGNOSIS — I10 Essential (primary) hypertension: Secondary | ICD-10-CM | POA: Diagnosis not present

## 2018-10-23 ENCOUNTER — Other Ambulatory Visit (INDEPENDENT_AMBULATORY_CARE_PROVIDER_SITE_OTHER): Payer: Self-pay

## 2018-10-23 MED ORDER — BACITRACIN-POLYMYXIN B 500-10000 UNIT/GM OP OINT: 1.0000 "application " | TOPICAL_OINTMENT | Freq: Four times a day (QID) | OPHTHALMIC | 6 refills | Status: DC

## 2018-10-24 DIAGNOSIS — F332 Major depressive disorder, recurrent severe without psychotic features: Secondary | ICD-10-CM | POA: Diagnosis not present

## 2018-10-30 DIAGNOSIS — Z880 Allergy status to penicillin: Secondary | ICD-10-CM | POA: Diagnosis not present

## 2018-10-30 DIAGNOSIS — Z8673 Personal history of transient ischemic attack (TIA), and cerebral infarction without residual deficits: Secondary | ICD-10-CM | POA: Diagnosis not present

## 2018-10-30 DIAGNOSIS — I10 Essential (primary) hypertension: Secondary | ICD-10-CM | POA: Diagnosis not present

## 2018-10-30 DIAGNOSIS — H9190 Unspecified hearing loss, unspecified ear: Secondary | ICD-10-CM | POA: Diagnosis not present

## 2018-10-30 DIAGNOSIS — Z79899 Other long term (current) drug therapy: Secondary | ICD-10-CM | POA: Diagnosis not present

## 2018-10-30 DIAGNOSIS — E119 Type 2 diabetes mellitus without complications: Secondary | ICD-10-CM | POA: Diagnosis not present

## 2018-10-30 DIAGNOSIS — Z91041 Radiographic dye allergy status: Secondary | ICD-10-CM | POA: Diagnosis not present

## 2018-10-30 DIAGNOSIS — Z885 Allergy status to narcotic agent status: Secondary | ICD-10-CM | POA: Diagnosis not present

## 2018-10-30 DIAGNOSIS — Z87891 Personal history of nicotine dependence: Secondary | ICD-10-CM | POA: Diagnosis not present

## 2018-10-30 DIAGNOSIS — H3589 Other specified retinal disorders: Secondary | ICD-10-CM | POA: Diagnosis not present

## 2018-10-30 DIAGNOSIS — Z91013 Allergy to seafood: Secondary | ICD-10-CM | POA: Diagnosis not present

## 2018-11-02 ENCOUNTER — Encounter (INDEPENDENT_AMBULATORY_CARE_PROVIDER_SITE_OTHER): Payer: Medicare Other | Admitting: Ophthalmology

## 2018-11-02 DIAGNOSIS — E1165 Type 2 diabetes mellitus with hyperglycemia: Secondary | ICD-10-CM | POA: Diagnosis not present

## 2018-11-02 DIAGNOSIS — R569 Unspecified convulsions: Secondary | ICD-10-CM | POA: Diagnosis not present

## 2018-11-02 DIAGNOSIS — Z6829 Body mass index (BMI) 29.0-29.9, adult: Secondary | ICD-10-CM | POA: Diagnosis not present

## 2018-11-02 DIAGNOSIS — H547 Unspecified visual loss: Secondary | ICD-10-CM | POA: Diagnosis not present

## 2018-11-02 DIAGNOSIS — I10 Essential (primary) hypertension: Secondary | ICD-10-CM | POA: Diagnosis not present

## 2018-11-02 DIAGNOSIS — Z299 Encounter for prophylactic measures, unspecified: Secondary | ICD-10-CM | POA: Diagnosis not present
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
## 2018-11-05 ENCOUNTER — Ambulatory Visit (INDEPENDENT_AMBULATORY_CARE_PROVIDER_SITE_OTHER): Payer: Medicare Other | Admitting: Ophthalmology

## 2018-11-05 ENCOUNTER — Other Ambulatory Visit: Payer: Self-pay

## 2018-11-05 ENCOUNTER — Encounter (INDEPENDENT_AMBULATORY_CARE_PROVIDER_SITE_OTHER): Payer: Self-pay | Admitting: Ophthalmology

## 2018-11-05 DIAGNOSIS — E119 Type 2 diabetes mellitus without complications: Secondary | ICD-10-CM

## 2018-11-05 DIAGNOSIS — H3589 Other specified retinal disorders: Secondary | ICD-10-CM | POA: Diagnosis not present

## 2018-11-05 DIAGNOSIS — H4312 Vitreous hemorrhage, left eye: Secondary | ICD-10-CM

## 2018-11-05 DIAGNOSIS — H25813 Combined forms of age-related cataract, bilateral: Secondary | ICD-10-CM | POA: Diagnosis not present

## 2018-11-05 DIAGNOSIS — H3581 Retinal edema: Secondary | ICD-10-CM | POA: Diagnosis not present

## 2018-11-05 DIAGNOSIS — H913 Deaf nonspeaking, not elsewhere classified: Secondary | ICD-10-CM

## 2018-11-05 MED ORDER — BRIMONIDINE TARTRATE 0.2 % OP SOLN: 1.0000 [drp] | Freq: Three times a day (TID) | OPHTHALMIC | 3 refills | Status: DC

## 2018-11-05 MED ORDER — DORZOLAMIDE HCL-TIMOLOL MAL 2-0.5 % OP SOLN: 1.0000 [drp] | Freq: Three times a day (TID) | OPHTHALMIC | 3 refills | Status: DC

## 2018-11-06 DIAGNOSIS — F332 Major depressive disorder, recurrent severe without psychotic features: Secondary | ICD-10-CM | POA: Diagnosis not present

## 2018-11-14 DIAGNOSIS — R413 Other amnesia: Secondary | ICD-10-CM | POA: Insufficient documentation

## 2018-11-14 DIAGNOSIS — C6932 Malignant neoplasm of left choroid: Secondary | ICD-10-CM | POA: Diagnosis not present

## 2018-11-20 DIAGNOSIS — F332 Major depressive disorder, recurrent severe without psychotic features: Secondary | ICD-10-CM | POA: Diagnosis not present

## 2018-11-20 DIAGNOSIS — I10 Essential (primary) hypertension: Secondary | ICD-10-CM | POA: Diagnosis not present

## 2018-11-21 DIAGNOSIS — I739 Peripheral vascular disease, unspecified: Secondary | ICD-10-CM | POA: Diagnosis not present

## 2018-11-21 DIAGNOSIS — H547 Unspecified visual loss: Secondary | ICD-10-CM | POA: Diagnosis not present

## 2018-11-21 DIAGNOSIS — I10 Essential (primary) hypertension: Secondary | ICD-10-CM | POA: Diagnosis not present

## 2018-11-21 DIAGNOSIS — Z299 Encounter for prophylactic measures, unspecified: Secondary | ICD-10-CM | POA: Diagnosis not present

## 2018-11-21 DIAGNOSIS — Z23 Encounter for immunization: Secondary | ICD-10-CM | POA: Diagnosis not present

## 2018-11-21 DIAGNOSIS — Z6829 Body mass index (BMI) 29.0-29.9, adult: Secondary | ICD-10-CM | POA: Diagnosis not present

## 2018-11-26 DIAGNOSIS — F332 Major depressive disorder, recurrent severe without psychotic features: Secondary | ICD-10-CM | POA: Diagnosis not present

## 2018-12-03 ENCOUNTER — Other Ambulatory Visit: Payer: Self-pay

## 2018-12-03 ENCOUNTER — Encounter: Payer: Self-pay | Admitting: Neurology

## 2018-12-03 ENCOUNTER — Ambulatory Visit (INDEPENDENT_AMBULATORY_CARE_PROVIDER_SITE_OTHER): Payer: Medicare Other | Admitting: Neurology

## 2018-12-03 ENCOUNTER — Ambulatory Visit: Payer: Self-pay | Admitting: Neurology

## 2018-12-03 VITALS — BP 144/78 | HR 78 | Temp 98.5°F

## 2018-12-03 DIAGNOSIS — G8114 Spastic hemiplegia affecting left nondominant side: Secondary | ICD-10-CM

## 2018-12-03 DIAGNOSIS — I63311 Cerebral infarction due to thrombosis of right middle cerebral artery: Secondary | ICD-10-CM

## 2018-12-03 MED ORDER — INCOBOTULINUMTOXINA 100 UNITS IM SOLR
500.0000 [IU] | INTRAMUSCULAR | Status: DC
  Administered 2018-12-03: 12:00:00 500 [IU] via INTRAMUSCULAR

## 2018-12-03 NOTE — Progress Notes (Signed)
**  [name] 100 units x 5 vials, [id number], Lot [id number], Exp 10/2020, office supply.//mck,rn**

## 2018-12-03 NOTE — Progress Notes (Signed)
Chief Complaint  Patient presents with  . Spastic Hemiplegia    [name] 100 units x 5 vials - office supply      PATIENT: Charles [name] DOB: 07/27/1955  HISTORICAL  Charles [name] is a 63 years old right-handed male, accompanied by his mother, and his brother, interpreter, for EMG guided Botox injection for his spastic left hemiparesis,I saw him first in Nov 2015, he was previously patients of Dr. Leonie Man, and Dr. Janann Colonel, who has performed EMG guided Botox injection in January, April, July 2015, for his spastic left hemiparesis, which he responded very well  He was born deaf, He had a history of pulmonary sarcoidosis, was treated with long-term low-dose steroid, also with past medical history of hypertension, diabetes, hyperlipidemia,  He suffered stroke in April 2014, was treated at [place], with residual severe left hemiparesis, per record  MRI of the brain showed remote right hemispheric infarct and small vessel disease type changes  MRA of brain abrupt  cutoff of flow at the right carotid terminus with nonvisualization of right middle cerebral artery branch vessels consistent with the patient's remote right hemispheric infarct. Fetal type origin of the posterior cerebral arteries with posterior cerebral artery supplied predominately from the anterior circulation. 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   2D Echocardiogram 60%, wall motion normal, LA normal size Carotid Doppler Bilateral: 1-39% ICA stenosis. Vertebral artery flow is antegrade  TEE no PFO, Pulm AVM  EEG in August 2014, normal  awake and asleep EEG. No focal or generalized epileptiform discharges noted   UPDATE May 8th 2017: He is with his mother and brother at today's clinical visit, last visit was November 25th 2015 for EMG guided Botox injection to his spastic left upper and lower extremity, he denies significant improvement with Botox injection.  Today he came in with a new  issue, complains a year history of intermittent right elbow discomfort, radiating pain to right arm, right hand, mainly involving right fifths, and fourth fingers, it happened with prolonged sitting, eating, or sleeping, he also noticed mild right hand weakness, difficulty to unscrew the bottle top. 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 He complains progressive worsening gait abnormality, falling few times, neck pain, low back pain, radiating pain to his right leg  UPDATE August 24 2015: He is accompanied by his mother brother and interpreter at today's clinical visit, he continue complains of low back pain radiating pain to right lower extremity, intermittent right arm hands paresthesia, worsening gait difficulty, urinary urgency,  We have personally reviewed MRI of cervical spine in June 2017: There is severe spinal stenosis at C3-C4 and at C4-C5 due to central disc protrusions/herniations, uncovertebral spurring, facet hypertrophy and congenitally short pedicles. There is subtle hyperintense signal within the spinal cord on sagittal STIR images just below the C3-C4 interspace but this is not confirmed on axial images. This could represent a mild cervical myelopathy. 2. There is moderate spinal stenosis at C5-C6 and C6-C7 mild spinal stenosis at C2-C3 to degenerative changes and congenitally short pedicles. 3. At C3-C4 there is moderately severe bilateral foraminal narrowing at could lead to impingement either of the C4 nerve roots. There does not appear to be nerve root impingement  at the other cervical levels  MRI of lumbar spine in June 2017: At L4-L5, there is broad disc bulging, moderately severe facet  hypertrophy, right greater than left, and minimal anterolisthesis. There is no nerve root compression at this level. When compared to the MRI dated 12/24/2012, the synovial cyst that was noted to the left on the prior MRI is no longer present. This relieves the left L5 nerve root impingement that was noted at that time. However, since the prior MRI there has been mild progression of the facet hypertrophy and minimal anterolisthesis . 2. There are milder degenerative changes at L3-L4 and L5-S1 that did not lead to any nerve root impingement, unchanged when compared to the previous MRI.  UPDATE Dec 03 2015: He had cervical decompression laminectomy C3-4-5 6 with foraminotomies of the C4-5 VI nerve roots by Dr. Saintclair Halsted on October 07 2015, patient reported significant improvement in his neck pain, family noticed he has difficulty sleeping, agitated.  I personally reviewed MRI cervical spine in September 2017: Posterior hardware fusion C3 through C7. Posterior decompression C3 through C5. Small posterior fluid collection in the soft tissues may represent postop fluid versus CSF leak. Small area of cord hyperintensity at the C4 level is unchanged compatible with chronic myelomalacia. No cord compression.  He complains of low back pain,  MRI of left femur there was no significant abnormality notice, MRI lumbar in June 2017, multilevel degenerative changes, but there was no significant canal or foraminal stenosis   UPDATE Jan 16th 2018: He complains of neck pain, using heating pad, stiff, limited range of motion, lean back to get relief, difficulty to get a comfortable position to sleep, he lost drip of his right hand,  Left leg pain when bearing weight, left leg tightness,   CAT scan of the brain in August 2017 showed large right MCA stroke  Update Jul 05 2016: He complains of worsening left-sided low back pain, unsteady gait, we have personally reviewed MRI of lumbar in June 2017, only mild degenerative  changes, there is no significant canal or foraminal stenosis.  He is taking Mobic/naproxen as needed, worsening spastic left hemiparesis, left hands in position   UPDATE June 5th 2018: He returned for a electronic stimulation guided xeomin injection for spastic left hemiparesis, was emphasize on left upper extremity today,  UPDATE Feb 08 2017: He did respond some to previous xeomin injection 500 units, still has significant left arm spasticity, weakness, left anterior thigh muscle spasm in the achy pain  UPDATE Jun 27 2017: He presented to the emergency room on April 22, 2017, complains of the headaches, apparently he was given cocktail of Compazine, Benadryl, Decadron,  CT head without contrast showed chronic right MCA stroke, no acute abnormality  Laboratory evaluation showed normal TSH, free T4, lipid panel, A1c was mildly elevated 6.0, MRI of left hip on April 26, 2017 showed mild marginal osteophytes on the femoral head, stable since 2017,  He was given right greater occipital nerve perineural steroid injection on June 19, 2017 by Dr. Rolla Flatten, his headache overall has much improved,  UPDATE August 30 2017: He now lives at nursing home, there is no recurrent seizure, he is not taking lower dose of lamotrigine 100 mg daily, responding well to previous EMG guided xeomin injection.  UPDATE Nov 29 2017: He was not sure about the benefit from previous injection, no recurrent seizure, was noted to have left anterior shin area swelling,  UPDATE Jan 9th 2020: He presented to emergency room  on February 25, 2018 for increased aggressive behavior at nursing home noncompliant with his medications, instead of continual lamotrigine, he was given a new list of uncollected medications Keppra 500 mg twice daily, which was known to cause increased agitation for him in the past,  We went over his polypharmacy in detail, will change him back to lamotrigine, also check the lamotrigine level for compliance,  received 500 units of [name] injection for spastic left upper extremity today  He was noted to have worsening confusion, we personally reviewed CT scan in September 2019: No acute abnormality, large right MCA stroke encephalomalacia, extensive periventricular small vessel disease  UPDATE September 12 2018: He is doing well, has no recurrent seizure, but complains of frequent headaches, over-the-counter medicine does not help, lateralized mild to moderate headache, with light noise sensitivity, but made it worse, resting was helpful, lasting half day to whole day  he is discharged from nursing home to be with his brother because of his behavior issues, he was on Keppra and lamotrigine, Keppra caused emotional outburst, will stop the Keppra, change to lamotrigine 100 mg 2 tablets twice a day,  Laboratory evaluation in January 2020, lamotrigine was not detected, Keppra was 17.9, normal B12, CMP showed elevated glucose 165, calcium of 8.5, negative troponin  UPDATE Dec 03 2018: He responded well to previous injection, there was no recurrent seizure,  REVIEW OF SYSTEMS: Full 14 system review of systems performed and notable only for as above ALLERGIES: Allergies  Allergen Reactions  . Hydrocodone Other (See Comments)    Makes "looney"  . Shellfish Allergy Hives and Swelling  . Metformin Diarrhea  . Contrast Media [Iodinated Diagnostic Agents] Itching  . Oxycodone Nausea And Vomiting    HOME MEDICATIONS: Current Outpatient Medications on File Prior to Visit  Medication Sig Dispense Refill  . albuterol (PROAIR HFA) 108 (90 Base) MCG/ACT inhaler Inhale 2 puffs into the lungs every 4 (four) hours as needed for wheezing or shortness of breath.     [name] atorvastatin (LIPITOR) 10 MG tablet Take 10 mg by mouth daily. (cholesterol)    . atropine 1 % ophthalmic solution Place into the left eye 2 (two) times daily.    . bacitracin-polymyxin b (POLYSPORIN) ophthalmic ointment Place 1 application into the left  eye 4 (four) times daily. apply to eye every 12 hours while awake 3.5 g 6  . brimonidine (ALPHAGAN) 0.2 % ophthalmic solution Place 1 drop into the left eye 3 (three) times daily. 10 mL 3  . clopidogrel (PLAVIX) 75 MG tablet Take 75 mg by mouth daily.    . dorzolamide-timolol (COSOPT) 22.3-6.8 MG/ML ophthalmic solution Place 1 drop into the left eye 3 (three) times daily. 10 mL 3  . escitalopram (LEXAPRO) 10 MG tablet Take 10 mg by mouth daily.    [name] gatifloxacin (ZYMAXID) 0.5 % SOLN Place 1 drop into the left eye 4 (four) times daily.    [name] ibuprofen (ADVIL) 600 MG tablet Take 600 mg by mouth every 6 (six) hours as needed.    . IncobotulinumtoxinA ([name] IM) Inject 500 Units into the muscle every 3 (three) months.    . lamoTRIgine (LAMICTAL) 100 MG tablet Take two tablets twice daily. (Patient taking differently: Take 200 mg by mouth 2 (two) times daily. Take two tablets twice daily.) 360 tablet 4  . losartan (COZAAR) 100 MG tablet Take 100 mg by mouth daily.    [name] omeprazole (PRILOSEC) 20 MG capsule Take 20 mg by mouth daily.    [name]  prednisoLONE acetate (PRED FORTE) 1 % ophthalmic suspension Place 1 drop into the left eye 4 (four) times daily. 15 mL 0  . tamsulosin (FLOMAX) 0.4 MG CAPS capsule Take 0.4 mg by mouth daily.    . traZODone (DESYREL) 50 MG tablet Take 50 mg by mouth at bedtime.     No current facility-administered medications on file prior to visit.     PAST MEDICAL HISTORY: Past Medical History:  Diagnosis Date  . Anemia   . Anxiety   . Asthma   . Deaf   . Depression   . Dyspnea    occasional w/exertion  . Essential hypertension   . GERD (gastroesophageal reflux disease)   . History of migraine   . History of stroke    Spastic hemiplegia, right MCA stroke April 2014 in [place]  . Mixed hyperlipidemia   . Other pancytopenia ([place]) 12/23/2015  . PAD (peripheral artery disease) ([place])   . Sarcoidosis   . Seizures ([place])    Last one over 2 yrs ago in 2018, controlled  with med  . Shingles   . Stroke ([place])   . Type 2 diabetes mellitus ([facility])    Pt saw Dr Brigitte Pulse on 09/10/18, Per Brother Charles [name], [place]  Dr Brigitte Pulse stated "DM over the last three months was excellent", "no longer needed med, will continue to monitor".    PAST SURGICAL HISTORY: Past Surgical History:  Procedure Laterality Date  . AIR/FLUID EXCHANGE Left 10/04/2018   Procedure: Air/Fluid Exchange;  Surgeon: Bernarda Caffey, MD;  Location: [facility];  Service: Ophthalmology;  Laterality: Left;  . BRAVO PH STUDY N/A 06/18/2013   pH STUDY SHOWS NEXIUM TWICE DAILY CONTROLS THE ACID IN HIS STOMACH. HE HAD VERY FEWEPISODE OF REGURGITATION RECORDED IN THE 2 DAYS THE STUDY WAS PERFORMED.   [name] COLONOSCOPY  2011   IN [place]  . 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   . ESOPHAGOGASTRODUODENOSCOPY N/A 05/05/2015   Procedure: ESOPHAGOGASTRODUODENOSCOPY (EGD);  Surgeon: Danie Binder, MD;  [facility];  Service: Endoscopy;  Laterality: N/A;  730  . INJECTION OF SILICONE OIL Left [id number]   Procedure: Injection Of Silicone Oil;  Surgeon: Bernarda Caffey, MD;  Location: [facility];  Service: Ophthalmology;  Laterality: Left;  . LASER PHOTO ABLATION Left 10/04/2018   Procedure: Laser Photo Ablation;  Surgeon: Bernarda Caffey, MD;  Location: [facility];  Service: Ophthalmology;  Laterality: Left;  [name] MALONEY DILATION N/A 06/18/2013   Procedure: MALONEY DILATION;  Surgeon: Danie Binder, MD;  [facility];  Service: Endoscopy;  Laterality: N/A;  . OTHER SURGICAL HISTORY     cyst removal head and chest  . PARS PLANA VITRECTOMY Left 10/04/2018   Procedure: PARS PLANA VITRECTOMY WITH 25 GAUGE;  Surgeon: Bernarda Caffey, MD;   Location: [facility];  Service: Ophthalmology;  Laterality: Left;  . POSTERIOR CERVICAL FUSION/FORAMINOTOMY N/A 10/07/2015   Procedure: Cervical Three-Four, Cervical Four-Five, Cervical Five-Six, Cervical Six-Seven Posterior Cervical Laminectomy and Fusion;  Surgeon: Kary Kos, MD;  Location: [facility];  Service: Neurosurgery;  Laterality: N/A;  . SAVORY DILATION N/A 06/18/2013   Procedure: SAVORY DILATION;  Surgeon: Danie Binder, MD;  [facility];  Service: Endoscopy;  Laterality: N/A;  . TEE WITHOUT  CARDIOVERSION N/A 10/04/2012   Procedure: TRANSESOPHAGEAL ECHOCARDIOGRAM (TEE);  Surgeon: Thayer Headings, MD;  Location: [facility];  Service: Cardiovascular;  Laterality: N/A;  . [place] accident  Both legs fractured and repaired surgically    FAMILY HISTORY: Family History  Problem Relation Age of Onset  . Diabetes Mother   . Hypertension Mother   . Glaucoma Mother   . Retinal detachment Mother   . Hypertension Father   . Diabetes Brother   . Glaucoma Brother   . Colon polyps Neg Hx   . Colon cancer Neg Hx     SOCIAL HISTORY:  Social History   Socioeconomic History  . Marital status: Single    Spouse name: Not on file  . Number of children: 0  . Years of education: HS  . Highest education level: Not on file  Occupational History  . Occupation: Disabled  Social Needs  . Financial resource strain: Not on file  . Food insecurity    Worry: Not on file    Inability: Not on file  . Transportation needs    Medical: Not on file    Non-medical: Not on file  Tobacco Use  . Smoking status: Former Smoker    Packs/day: 1.00    Years: 29.00    Pack years: 29.00    Types: Cigarettes    Quit date: 02/22/1992    Years since quitting: 26.7  . Smokeless tobacco: Never Used  Substance and Sexual Activity  . Alcohol use: Not Currently    Comment: occasionally beer  . Drug use: Yes    Types: Marijuana, "Crack" cocaine    Comment: hx of  crack cocaine "long time ago" per pt - hard to tell how long it is has been since he used,  . Sexual activity: Never  Lifestyle  . Physical activity    Days per week: Not on file    Minutes per session: Not on file  . Stress: Not on file  Relationships  . Social Herbalist on phone: Not on file    Gets together: Not on file    Attends religious service: Not on file    Active member of club or organization: Not on file    Attends meetings of clubs or organizations: Not on file    Relationship status: Not on file  . Intimate partner violence    Fear of current or ex partner: Not on file    Emotionally abused: Not on file    Physically abused: Not on file    Forced sexual activity: Not on file  Other Topics Concern  . Not on file  Social History Narrative   Patient lives at home with his family. Brother, sister, mother    Patient does not work.    Patient has a high school education.    Patient has one step child      PHYSICAL EXAM   Vitals:   12/03/18 0859  BP: (!) 144/78  Pulse: 78  Temp: 98.5 F (36.9 C)    Not recorded      There is no height or weight on file to calculate BMI.   PHYSICAL EXAMNIATION:    MOTOR: Spastic left hemiparesis, wearing left ankle brace, mild left shoulder antigravity movement left shoulder abduction with passive movement maximum 90, left elbow 170, complains of left elbow pain with passive movement, left thumb in position, left hip flexion 3, left knee extension 4, left  knee flexion 4, he also has mild right ankle dorsiflexion weakness.    Lab Results  Component Value Date   WBC 4.6 10/04/2018   HGB 13.6 10/04/2018   HCT 40.2 10/04/2018   MCV 90.3 10/04/2018   PLT 103 (L) 10/04/2018      Component Value Date/Time   NA 138 10/04/2018 1316   K 4.2 10/04/2018 1316   CL 102 10/04/2018 1316   CO2 24 10/04/2018 1316   GLUCOSE 106 (H) 10/04/2018 1316   BUN 12 10/04/2018 1316   CREATININE 1.02 10/04/2018 1316    CALCIUM 8.9 10/04/2018 1316   PROT 6.9 06/18/2018 1916   ALBUMIN 4.2 06/18/2018 1916   AST 23 06/18/2018 1916   ALT 18 06/18/2018 1916   ALKPHOS 75 06/18/2018 1916   BILITOT 0.7 06/18/2018 1916   GFRNONAA >60 10/04/2018 1316   GFRAA >60 10/04/2018 1316   Lab Results  Component Value Date   CHOL 160 09/13/2016   HDL 59 09/13/2016   LDLCALC 43 09/13/2016   TRIG 288 (H) 09/13/2016   CHOLHDL 2.7 09/13/2016   Lab Results  Component Value Date   HGBA1C 6.0 (H) 06/13/2017   Lab Results  Component Value Date   [id number] 03/01/2018   Lab Results  Component Value Date   TSH 1.83 06/13/2017      ASSESSMENT AND PLAN  Charles [name] is a 63 y.o. male  Congenital deaf, use sign language, history is through interpreter   Cervical spondylitic myelopathy  Status post cervical decompression surgery in August 2017, Continued to complains of neck pain  I have encouraged him neck stretching exercise, hot compression  History of seizure,  Difficulty tolerating Keppra, with increaseed aggressive behavior, ED presentation on February 25, 2017,  Continue lamotrigine 100 mg 2 tablets twice a day  Worsening memory loss  Is most consistent with his large size right MCA stroke, encephalomalacia, extensive periventricular small vessel disease  Normal B12, TSH Frequent headaches  Has migraine features  Not a good candidate for polypharmacy due to age, vascular risk factor, polypharmacy, history of large right MCA stroke,  History of stroke, with residual spastic left hemiparesis   Continue electrical stimulation guided xeomin injection, used 500 units  Left pectoralis major 50 units Left Latissimus dorsi 50 units Left brachialis 100 units Left pronator teres 50 units Left flexor digitorum profundi 50 units Left flexor digitorum superficialis 50 units  Left opponents 25  units Left palmaris longus 25 units Left flexor carpi ulnaris 50 units Left flexor carpi radialis 50 units     Marcial Pacas, M.D. Ph.D.  [facility] [address] [place], [place] [address] [phone]

## 2018-12-04 ENCOUNTER — Other Ambulatory Visit (INDEPENDENT_AMBULATORY_CARE_PROVIDER_SITE_OTHER): Payer: Self-pay

## 2018-12-04 DIAGNOSIS — F332 Major depressive disorder, recurrent severe without psychotic features: Secondary | ICD-10-CM | POA: Diagnosis not present

## 2018-12-04 MED ORDER — BRIMONIDINE TARTRATE 0.2 % OP SOLN: 1.0000 [drp] | Freq: Three times a day (TID) | OPHTHALMIC | 3 refills | Status: DC

## 2018-12-04 MED ORDER — LATANOPROST 0.005 % OP SOLN: 1.0000 [drp] | Freq: Every day | OPHTHALMIC | 1 refills | Status: AC

## 2018-12-05 ENCOUNTER — Ambulatory Visit: Payer: Self-pay | Admitting: Neurology

## 2018-12-07 ENCOUNTER — Other Ambulatory Visit: Payer: Self-pay

## 2018-12-07 DIAGNOSIS — Z20828 Contact with and (suspected) exposure to other viral communicable diseases: Secondary | ICD-10-CM | POA: Diagnosis not present

## 2018-12-07 DIAGNOSIS — Z20822 Contact with and (suspected) exposure to covid-19: Secondary | ICD-10-CM

## 2018-12-08 LAB — NOVEL CORONAVIRUS, NAA: SARS-CoV-2, NAA: DETECTED — AB

## 2018-12-17 ENCOUNTER — Encounter (INDEPENDENT_AMBULATORY_CARE_PROVIDER_SITE_OTHER): Payer: Medicare Other | Admitting: Ophthalmology

## 2018-12-18 ENCOUNTER — Other Ambulatory Visit (INDEPENDENT_AMBULATORY_CARE_PROVIDER_SITE_OTHER): Payer: Self-pay

## 2018-12-18 MED ORDER — ATROPINE SULFATE 1 % OP SOLN: 1.0000 [drp] | Freq: Two times a day (BID) | OPHTHALMIC | 1 refills | Status: DC

## 2018-12-19 ENCOUNTER — Ambulatory Visit: Payer: Medicare Other | Admitting: Podiatry

## 2018-12-19 DIAGNOSIS — I10 Essential (primary) hypertension: Secondary | ICD-10-CM | POA: Diagnosis not present

## 2018-12-21 DIAGNOSIS — E1165 Type 2 diabetes mellitus with hyperglycemia: Secondary | ICD-10-CM | POA: Diagnosis not present

## 2018-12-21 DIAGNOSIS — Z299 Encounter for prophylactic measures, unspecified: Secondary | ICD-10-CM | POA: Diagnosis not present

## 2018-12-21 DIAGNOSIS — I639 Cerebral infarction, unspecified: Secondary | ICD-10-CM | POA: Diagnosis not present

## 2018-12-21 DIAGNOSIS — J45909 Unspecified asthma, uncomplicated: Secondary | ICD-10-CM | POA: Diagnosis not present

## 2018-12-21 DIAGNOSIS — Z87891 Personal history of nicotine dependence: Secondary | ICD-10-CM | POA: Diagnosis not present

## 2018-12-21 DIAGNOSIS — L03116 Cellulitis of left lower limb: Secondary | ICD-10-CM | POA: Diagnosis not present

## 2018-12-21 DIAGNOSIS — I10 Essential (primary) hypertension: Secondary | ICD-10-CM | POA: Diagnosis not present

## 2018-12-21 DIAGNOSIS — Z6829 Body mass index (BMI) 29.0-29.9, adult: Secondary | ICD-10-CM | POA: Diagnosis not present

## 2018-12-26 DIAGNOSIS — I739 Peripheral vascular disease, unspecified: Secondary | ICD-10-CM | POA: Diagnosis not present

## 2018-12-26 DIAGNOSIS — I10 Essential (primary) hypertension: Secondary | ICD-10-CM | POA: Diagnosis not present

## 2018-12-26 DIAGNOSIS — L03116 Cellulitis of left lower limb: Secondary | ICD-10-CM | POA: Diagnosis not present

## 2018-12-26 DIAGNOSIS — Z6830 Body mass index (BMI) 30.0-30.9, adult: Secondary | ICD-10-CM | POA: Diagnosis not present

## 2018-12-26 DIAGNOSIS — Z299 Encounter for prophylactic measures, unspecified: Secondary | ICD-10-CM | POA: Diagnosis not present

## 2018-12-26 DIAGNOSIS — E1165 Type 2 diabetes mellitus with hyperglycemia: Secondary | ICD-10-CM | POA: Diagnosis not present
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
## 2018-12-31 ENCOUNTER — Encounter (INDEPENDENT_AMBULATORY_CARE_PROVIDER_SITE_OTHER): Payer: Medicare Other | Admitting: Ophthalmology

## 2018-12-31 DIAGNOSIS — H25813 Combined forms of age-related cataract, bilateral: Secondary | ICD-10-CM

## 2018-12-31 DIAGNOSIS — H4312 Vitreous hemorrhage, left eye: Secondary | ICD-10-CM

## 2018-12-31 DIAGNOSIS — H913 Deaf nonspeaking, not elsewhere classified: Secondary | ICD-10-CM

## 2018-12-31 DIAGNOSIS — E119 Type 2 diabetes mellitus without complications: Secondary | ICD-10-CM

## 2018-12-31 DIAGNOSIS — H3581 Retinal edema: Secondary | ICD-10-CM

## 2018-12-31 DIAGNOSIS — H3589 Other specified retinal disorders: Secondary | ICD-10-CM

## 2018-12-31 NOTE — Progress Notes (Signed)
This encounter was created in error - please disregard.

## 2019-01-01 DIAGNOSIS — F332 Major depressive disorder, recurrent severe without psychotic features: Secondary | ICD-10-CM | POA: Diagnosis not present

## 2019-01-30 DIAGNOSIS — F332 Major depressive disorder, recurrent severe without psychotic features: Secondary | ICD-10-CM | POA: Diagnosis not present

## 2019-01-31 ENCOUNTER — Ambulatory Visit (INDEPENDENT_AMBULATORY_CARE_PROVIDER_SITE_OTHER): Payer: Medicare Other | Admitting: Cardiology

## 2019-01-31 ENCOUNTER — Encounter: Payer: Self-pay | Admitting: *Deleted

## 2019-01-31 ENCOUNTER — Encounter: Payer: Self-pay | Admitting: Cardiology

## 2019-01-31 ENCOUNTER — Other Ambulatory Visit: Payer: Self-pay

## 2019-01-31 VITALS — BP 142/78 | HR 96 | Ht 64.0 in | Wt 173.0 lb

## 2019-01-31 DIAGNOSIS — Z87898 Personal history of other specified conditions: Secondary | ICD-10-CM | POA: Diagnosis not present

## 2019-01-31 DIAGNOSIS — I63311 Cerebral infarction due to thrombosis of right middle cerebral artery: Secondary | ICD-10-CM | POA: Diagnosis not present

## 2019-01-31 DIAGNOSIS — I10 Essential (primary) hypertension: Secondary | ICD-10-CM

## 2019-01-31 DIAGNOSIS — H905 Unspecified sensorineural hearing loss: Secondary | ICD-10-CM

## 2019-01-31 NOTE — Progress Notes (Signed)
Cardiology Office Note  Date: 01/31/2019   ID: Braxtin, Filipowicz December 03, 1955, MRN [id number]  PCP:  [name], MD  Cardiologist:  Rozann Lesches, MD Electrophysiologist:  None   Chief Complaint  Patient presents with  . Cardiac follow-up    History of Present Illness: Charles [name] is a 63 y.o. male last seen in January 2019.  He is here today with his brother and there is also a sign language interpreter present.  He does not report any chest pain.  He does have dyspnea on exertion which has been stable.  Brother states that he typically uses a wheelchair but does have a cane for limited ambulation.  He also uses an inhaler.  He continues to follow-up with Dr. Manuella Ghazi.  We are requesting his most recent lab work.  I reviewed his medications which have been updated below.  He does not report any obvious intolerances.  I personally reviewed his ECG today which shows sinus rhythm with LVH and repolarization abnormalities.  Past Medical History:  Diagnosis Date  . Anemia   . Anxiety   . Asthma   . Deaf   . Depression   . Essential hypertension   . GERD (gastroesophageal reflux disease)   . History of migraine   . History of stroke    Spastic hemiplegia, right MCA stroke April 2014 in [place]  . Mixed hyperlipidemia   . PAD (peripheral artery disease) ([place])   . Sarcoidosis   . Seizures ([place])    Last one over 2 yrs ago in 2018, controlled with med  . Shingles   . Type 2 diabetes mellitus ([place])     Past Surgical History:  Procedure Laterality Date  . AIR/FLUID EXCHANGE Left 10/04/2018   Procedure: Air/Fluid Exchange;  Surgeon: Bernarda Caffey, MD;  Location: [facility];  Service: Ophthalmology;  Laterality: Left;  . BRAVO PH STUDY N/A 06/18/2013   pH STUDY SHOWS NEXIUM TWICE DAILY CONTROLS THE ACID IN HIS STOMACH. HE HAD VERY FEWEPISODE OF REGURGITATION RECORDED IN THE 2 DAYS THE STUDY WAS PERFORMED.   [name] COLONOSCOPY  2011   [place]  . ESOPHAGOGASTRODUODENOSCOPY N/A  06/18/2013   Dr.Fields- probable proximal esophageal web,dilation performed, bravo cap placed, mild non-erosive gastritis in the gastric antrum and on the greater curvature of the gastric body bx- granulomatous gastritis, duodenal mucosa showed no abnormalities in the bulb and second portion of the duodenum.   . ESOPHAGOGASTRODUODENOSCOPY N/A 05/05/2015   Procedure: ESOPHAGOGASTRODUODENOSCOPY (EGD);  Surgeon: Danie Binder, MD;  [facility];  Service: Endoscopy;  Laterality: N/A;  730  . INJECTION OF SILICONE OIL Left [id number]   Procedure: Injection Of Silicone Oil;  Surgeon: Bernarda Caffey, MD;  Location: [facility];  Service: Ophthalmology;  Laterality: Left;  . LASER PHOTO ABLATION Left 10/04/2018   Procedure: Laser Photo Ablation;  Surgeon: Bernarda Caffey, MD;  Location: [facility];  Service: Ophthalmology;  Laterality: Left;  [name] MALONEY DILATION N/A 06/18/2013   Procedure: MALONEY DILATION;  Surgeon: Danie Binder, MD;  [facility];  Service: Endoscopy;  Laterality: N/A;  . OTHER SURGICAL HISTORY     cyst removal head and chest  . PARS PLANA VITRECTOMY Left 10/04/2018   Procedure: PARS PLANA VITRECTOMY WITH 25 GAUGE;  Surgeon: Bernarda Caffey, MD;  Location: [facility];  Service: Ophthalmology;  Laterality: Left;  . POSTERIOR CERVICAL FUSION/FORAMINOTOMY N/A 10/07/2015   Procedure: Cervical Three-Four, Cervical Four-Five, Cervical Five-Six, Cervical Six-Seven Posterior Cervical Laminectomy and Fusion;  Surgeon: Kary Kos, MD;  Location: [facility]URO ORS;  Service: Neurosurgery;  Laterality: N/A;  . SAVORY DILATION N/A 06/18/2013   Procedure: SAVORY DILATION;  Surgeon: Danie Binder, MD;  [facility];  Service: Endoscopy;  Laterality: N/A;  . TEE WITHOUT CARDIOVERSION N/A 10/04/2012   Procedure: TRANSESOPHAGEAL ECHOCARDIOGRAM (TEE);  Surgeon: Thayer Headings, MD;  Location: [facility] ENDOSCOPY;  Service: Cardiovascular;  Laterality: N/A;  . [place] accident   Both legs fractured and repaired surgically    Current Outpatient Medications  Medication Sig Dispense Refill  . albuterol (PROAIR HFA) 108 (90 Base) MCG/ACT inhaler Inhale 2 puffs into the lungs every 4 (four) hours as needed for wheezing or shortness of breath.     [name] amLODipine (NORVASC) 5 MG tablet Take 5 mg by mouth daily.    [name] atorvastatin (LIPITOR) 10 MG tablet Take 10 mg by mouth daily. (cholesterol)    . atropine 1 % ophthalmic solution Place 1 drop into the left eye 2 (two) times daily. 5 mL 1  . bacitracin-polymyxin b (POLYSPORIN) ophthalmic ointment Place 1 application into the left eye 4 (four) times daily. apply to eye every 12 hours while awake 3.5 g 6  . brimonidine (ALPHAGAN) 0.2 % ophthalmic solution Place 1 drop into the left eye 3 (three) times daily. 10 mL 3  . clopidogrel (PLAVIX) 75 MG tablet Take 75 mg by mouth daily.    . dorzolamide-timolol (COSOPT) 22.3-6.8 MG/ML ophthalmic solution Place 1 drop into the left eye 3 (three) times daily. 10 mL 3  . escitalopram (LEXAPRO) 10 MG tablet Take 10 mg by mouth daily.    [name] gatifloxacin (ZYMAXID) 0.5 % SOLN Place 1 drop into the left eye 4 (four) times daily.    [name] ibuprofen (ADVIL) 600 MG tablet Take 600 mg by mouth every 6 (six) hours as needed.    . IncobotulinumtoxinA ([name] IM) Inject 500 Units into the muscle every 3 (three) months.    . lamoTRIgine (LAMICTAL) 100 MG tablet Take two tablets twice daily. (Patient taking differently: Take 200 mg by mouth 2 (two) times daily. Take two tablets twice daily.) 360 tablet 4  . latanoprost (XALATAN) 0.005 % ophthalmic solution Place 1 drop into the left eye at bedtime. 2.5 mL 1  . losartan (COZAAR) 100 MG tablet Take 100 mg by mouth daily.    [name] omeprazole (PRILOSEC) 20 MG capsule Take 20 mg by mouth daily.    . prednisoLONE acetate (PRED FORTE) 1 % ophthalmic suspension Place 1 drop into the left eye 4 (four) times daily. 15 mL 0  . tamsulosin (FLOMAX) 0.4 MG CAPS capsule Take 0.4  mg by mouth daily.    . traZODone (DESYREL) 50 MG tablet Take 50 mg by mouth at bedtime.     Current Facility-Administered Medications  Medication Dose Route Frequency Provider Last Rate Last Admin  . incobotulinumtoxinA ([name]) 100 units injection 500 Units  500 Units Intramuscular Q90 days Marcial Pacas, MD   500 Units at 12/03/18 1144   Allergies:  Hydrocodone, Shellfish allergy, Metformin, Contrast media [iodinated diagnostic agents], and Oxycodone   Social History: The patient  reports that he quit smoking about 26 years ago. His smoking use included cigarettes. He has a 29.00 pack-year smoking history. He has never used smokeless tobacco. He reports previous alcohol use. He reports current drug use. Drugs: Marijuana and "Crack" cocaine.   ROS:  Please see the history of present illness. Otherwise,  complete review of systems is positive for none.  All other systems are reviewed and negative.   Physical Exam: VS:  BP (!) 142/78   Pulse 96   Ht 5\' 4"  (1.626 m)   Wt 173 lb (78.5 kg)   SpO2 97%   BMI 29.70 kg/m , BMI Body mass index is 29.7 kg/m.  Wt Readings from Last 3 Encounters:  01/31/19 173 lb (78.5 kg)  10/03/18 170 lb (77.1 kg)  06/18/18 179 lb 14.3 oz (81.6 kg)    General: Patient appears comfortable at rest.  Uses a cane today. HEENT: Conjunctiva and lids normal, wearing a mask. Neck: Supple, no elevated JVP or carotid bruits, no thyromegaly. Lungs: Clear to auscultation, nonlabored breathing at rest. Cardiac: Regular rate and rhythm, no S3, soft systolic murmur. Abdomen: Soft, nontender, bowel sounds present. Extremities: No pitting edema, distal pulses 2+. Skin: Warm and dry. Musculoskeletal: No kyphosis. Neuropsychiatric: Alert and oriented x3, congenital deafness, chronic left-sided weakness.  ECG:  An ECG dated 06/18/2018 was personally reviewed today and demonstrated:  Sinus rhythm with LVH.  Recent Labwork: 06/18/2018: ALT 18; AST 23 10/04/2018: BUN 12;  Creatinine, Ser 1.02; Hemoglobin 13.6; Platelets 103; Potassium 4.2; Sodium 138     Component Value Date/Time   CHOL 160 09/13/2016 0202   TRIG 288 (H) 09/13/2016 0202   HDL 59 09/13/2016 0202   CHOLHDL 2.7 09/13/2016 0202   VLDL 58 (H) 09/13/2016 0202   LDLCALC 43 09/13/2016 0202    Other Studies Reviewed Today:  Echocardiogram 09/13/2016: Study Conclusions  - Left ventricle: The cavity size was normal. Wall thickness was increased in a pattern of mild LVH. Systolic function was normal. The estimated ejection fraction was in the range of 60% to 65%. Wall motion was normal; there were no regional wall motion abnormalities. Doppler parameters are consistent with abnormal left ventricular relaxation (grade 1 diastolic dysfunction). - Aortic valve: Valve area (VTI): 2.67 cm^2. Valve area (Vmax): 2.67 cm^2. Valve area (Vmean): 2.64 cm^2. - Technically adequate study.  Assessment and Plan:  1.  History of chest pain without recurrence.  ECG reviewed and shows LVH with repolarization abnormalities.  He does have a history of previous ischemic stroke with left-sided weakness and remains on medical therapy for risk factor reduction.  He is on Lipitor, Plavix, Norvasc, and Cozaar.  2.  Essential hypertension, systolic is in the 0000000 today.  Keep follow-up with Dr. Manuella Ghazi for further medication adjustments.  3.  Congenital deafness.  Medication Adjustments/Labs and Tests Ordered: Current medicines are reviewed at length with the patient today.  Concerns regarding medicines are outlined above.   Tests Ordered: Orders Placed This Encounter  Procedures  . EKG 12-Lead    Medication Changes: No orders of the defined types were placed in this encounter.   Disposition:  Follow up 1 year in the [place] office.  Signed, Satira Sark, MD, [facility] 01/31/2019 4:56 PM    [place] [facility] [place], [place], [place] [address] Phone: [phone];  Fax: [phone]

## 2019-01-31 NOTE — Patient Instructions (Addendum)

## 2019-02-12 DIAGNOSIS — H3589 Other specified retinal disorders: Secondary | ICD-10-CM | POA: Diagnosis not present

## 2019-02-13 DIAGNOSIS — I10 Essential (primary) hypertension: Secondary | ICD-10-CM | POA: Diagnosis not present

## 2019-02-25 ENCOUNTER — Encounter (INDEPENDENT_AMBULATORY_CARE_PROVIDER_SITE_OTHER): Payer: Medicare Other | Admitting: Ophthalmology
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
## 2019-02-28 DIAGNOSIS — F332 Major depressive disorder, recurrent severe without psychotic features: Secondary | ICD-10-CM | POA: Diagnosis not present

## 2019-03-04 ENCOUNTER — Other Ambulatory Visit (INDEPENDENT_AMBULATORY_CARE_PROVIDER_SITE_OTHER): Payer: Self-pay | Admitting: Ophthalmology

## 2019-03-04 ENCOUNTER — Ambulatory Visit: Payer: Self-pay | Admitting: Neurology

## 2019-03-11 ENCOUNTER — Encounter (INDEPENDENT_AMBULATORY_CARE_PROVIDER_SITE_OTHER): Payer: Self-pay | Admitting: Ophthalmology

## 2019-03-11 ENCOUNTER — Other Ambulatory Visit: Payer: Self-pay

## 2019-03-11 ENCOUNTER — Ambulatory Visit (INDEPENDENT_AMBULATORY_CARE_PROVIDER_SITE_OTHER): Payer: Medicare Other | Admitting: Ophthalmology

## 2019-03-11 DIAGNOSIS — E119 Type 2 diabetes mellitus without complications: Secondary | ICD-10-CM | POA: Diagnosis not present

## 2019-03-11 DIAGNOSIS — H913 Deaf nonspeaking, not elsewhere classified: Secondary | ICD-10-CM

## 2019-03-11 DIAGNOSIS — H4312 Vitreous hemorrhage, left eye: Secondary | ICD-10-CM

## 2019-03-11 DIAGNOSIS — H3322 Serous retinal detachment, left eye: Secondary | ICD-10-CM

## 2019-03-11 DIAGNOSIS — H3589 Other specified retinal disorders: Secondary | ICD-10-CM | POA: Diagnosis not present

## 2019-03-11 DIAGNOSIS — H3581 Retinal edema: Secondary | ICD-10-CM | POA: Diagnosis not present

## 2019-03-11 DIAGNOSIS — H25813 Combined forms of age-related cataract, bilateral: Secondary | ICD-10-CM | POA: Diagnosis not present

## 2019-03-14 DIAGNOSIS — F332 Major depressive disorder, recurrent severe without psychotic features: Secondary | ICD-10-CM | POA: Diagnosis not present

## 2019-03-17 ENCOUNTER — Encounter (HOSPITAL_COMMUNITY): Payer: Self-pay | Admitting: *Deleted

## 2019-03-17 ENCOUNTER — Emergency Department (HOSPITAL_COMMUNITY): Payer: Medicare Other

## 2019-03-17 ENCOUNTER — Emergency Department (HOSPITAL_COMMUNITY)
Admission: EM | Admit: 2019-03-17 | Discharge: 2019-03-17 | Disposition: A | Discharge status: Home / Self Care | Payer: Medicare Other | Attending: Emergency Medicine | Admitting: Emergency Medicine

## 2019-03-17 ENCOUNTER — Other Ambulatory Visit: Payer: Self-pay

## 2019-03-17 DIAGNOSIS — M79675 Pain in left toe(s): Secondary | ICD-10-CM | POA: Diagnosis not present

## 2019-03-17 DIAGNOSIS — R519 Headache, unspecified: Secondary | ICD-10-CM | POA: Diagnosis not present

## 2019-03-17 DIAGNOSIS — Z87891 Personal history of nicotine dependence: Secondary | ICD-10-CM | POA: Diagnosis not present

## 2019-03-17 DIAGNOSIS — Z79899 Other long term (current) drug therapy: Secondary | ICD-10-CM | POA: Insufficient documentation

## 2019-03-17 DIAGNOSIS — F1210 Cannabis abuse, uncomplicated: Secondary | ICD-10-CM | POA: Insufficient documentation

## 2019-03-17 DIAGNOSIS — E119 Type 2 diabetes mellitus without complications: Secondary | ICD-10-CM | POA: Insufficient documentation

## 2019-03-17 DIAGNOSIS — J45909 Unspecified asthma, uncomplicated: Secondary | ICD-10-CM | POA: Diagnosis not present

## 2019-03-17 DIAGNOSIS — F1410 Cocaine abuse, uncomplicated: Secondary | ICD-10-CM | POA: Insufficient documentation

## 2019-03-17 DIAGNOSIS — I10 Essential (primary) hypertension: Secondary | ICD-10-CM | POA: Insufficient documentation

## 2019-03-17 MED ORDER — IBUPROFEN 400 MG PO TABS
400.0000 mg | ORAL_TABLET | Freq: Once | ORAL | Status: AC
  Administered 2019-03-17: 400 mg via ORAL
  Filled 2019-03-17: qty 1

## 2019-03-17 NOTE — Discharge Instructions (Addendum)
It was our pleasure to provide your ER care today - we hope that you feel better.  You may try taking ibuprofen or naprosyn as need for pain.  May try foot orthotic brace/support.   Follow up with podiatrist as planned.   Return to ER if worse, new symptoms, fevers, spreading redness, increased swelling, new or severe headache, or other concern.

## 2019-03-17 NOTE — ED Provider Notes (Signed)
[facility] EMERGENCY DEPARTMENT Provider Note   CSN: [id number] Arrival date & time: 03/17/19  1533     History Chief Complaint  Patient presents with  . Toe Pain    REDFORD SALSER is a 64 y.o. male.  Patient with hx htn, prior cva, presents c/o pain to base of left great toe for the past month. Patient points to area on plantar aspect of foot, in region of 1st MTP. No nail pain.  Denies specific injury. Symptoms gradual onset 1 month ago, constant, dull, persistent. No redness to toe or skin changes. pcp has referred to podiatry f/u but hasnt seen yet. Brother notes patient intermittently c/o headache as well - via interpreter, pt denies any current headache, states he has intermittently headache in past, but it goes away when taking acetaminophen. Notes that acetaminophen is not helping with base of toe pain.   The history is provided by the patient and a relative. A language interpreter was used.       Past Medical History:  Diagnosis Date  . Anemia   . Anxiety   . Asthma   . Deaf   . Depression   . Essential hypertension   . GERD (gastroesophageal reflux disease)   . History of migraine   . History of stroke    Spastic hemiplegia, right MCA stroke April 2014 in [place]  . Mixed hyperlipidemia   . PAD (peripheral artery disease) ([place])   . Sarcoidosis   . Seizures ([place])    Last one over 2 yrs ago in 2018, controlled with med  . Shingles   . Type 2 diabetes mellitus [facility])     Patient Active Problem List   Diagnosis Date Noted  . Chronic nonintractable headache 09/12/2018  . Chronic migraine 09/12/2018  . Cerebrovascular accident (CVA) ([place]) 03/01/2018  . Polypharmacy 03/01/2018  . 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 Nonintractable headache 06/27/2017  . Mixed hyperlipidemia 05/17/2017  . Uncontrolled type 2 diabetes mellitus with hyperglycemia ([place]) 05/16/2017  .  Thrombocytopenia ([place]) 09/13/2016  . Hypoglycemia   . Syncope 09/12/2016  . Seizure disorder as sequela of cerebrovascular accident ([place]) 09/12/2016  . MDD (major depressive disorder), recurrent episode, severe ([place]) 12/30/2015  . Suicidal ideation   . Other pancytopenia ([place]) 12/23/2015  . Loss of weight 11/12/2015  . 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 Iron deficiency anemia 09/22/2015  . Spinal stenosis in cervical region 08/21/2015  . Neck pain 08/12/2015  . Low back pain 08/12/2015  . Abnormality of gait 08/12/2015  . Neuropathy of right upper extremity 06/29/2015  . Acute rhinitis 05/05/2015  . Abdominal pain, epigastric 04/23/2015  . Arm pain, right 04/23/2015  . Atypical chest pain 07/23/2013  . 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 Dyspepsia 06/13/2013  . Dysphagia 06/13/2013  . Headache 11/30/2012  . Solitary pulmonary nodule 11/20/2012  . Spastic hemiplegia affecting left nondominant side ([place]) 11/08/2012  . Essential hypertension, benign 10/12/2012  . COPD GOLD II with reversibility  10/12/2012  . 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 CVA (cerebral vascular accident) ([place]) 10/03/2012  . Diabetes ([place]) 10/03/2012  . Sarcoidosis ([place]) 10/03/2012    Past Surgical History:  Procedure Laterality Date  . AIR/FLUID EXCHANGE Left 10/04/2018   Procedure: Air/Fluid Exchange;  Surgeon: Bernarda Caffey, MD;  Location: [place];  Service: Ophthalmology;  Laterality: Left;  . BRAVO PH STUDY N/A 06/18/2013   pH STUDY SHOWS NEXIUM TWICE DAILY CONTROLS  THE ACID IN HIS STOMACH. HE HAD VERY FEWEPISODE OF REGURGITATION RECORDED IN THE 2 DAYS THE STUDY WAS PERFORMED.   [name] COLONOSCOPY  2011   IN [place]  . ESOPHAGOGASTRODUODENOSCOPY N/A 06/18/2013   Dr.Fields- probable proximal esophageal web,dilation performed, bravo cap placed, mild non-erosive gastritis in the gastric antrum and on the greater curvature of the gastric body bx- granulomatous gastritis, duodenal mucosa showed no  abnormalities in the bulb and second portion of the duodenum.   . ESOPHAGOGASTRODUODENOSCOPY N/A 05/05/2015   Procedure: ESOPHAGOGASTRODUODENOSCOPY (EGD);  Surgeon: Danie Binder, MD;  [facility];  Service: Endoscopy;  Laterality: N/A;  730  . INJECTION OF SILICONE OIL Left [id number]   Procedure: Injection Of Silicone Oil;  Surgeon: Bernarda Caffey, MD;  Location: [place];  Service: Ophthalmology;  Laterality: Left;  . LASER PHOTO ABLATION Left 10/04/2018   Procedure: Laser Photo Ablation;  Surgeon: Bernarda Caffey, MD;  Location: [facility];  Service: Ophthalmology;  Laterality: Left;  [name] MALONEY DILATION N/A 06/18/2013   Procedure: MALONEY DILATION;  Surgeon: Danie Binder, MD;  [facility];  Service: Endoscopy;  Laterality: N/A;  . OTHER SURGICAL HISTORY     cyst removal head and chest  . PARS PLANA VITRECTOMY Left 10/04/2018   Procedure: PARS PLANA VITRECTOMY WITH 25 GAUGE;  Surgeon: Bernarda Caffey, MD;  Location: [place];  Service: Ophthalmology;  Laterality: Left;  . POSTERIOR CERVICAL FUSION/FORAMINOTOMY N/A 10/07/2015   Procedure: Cervical Three-Four, Cervical Four-Five, Cervical Five-Six, Cervical Six-Seven Posterior Cervical Laminectomy and Fusion;  Surgeon: Kary Kos, MD;  Location: [place] [facility];  Service: Neurosurgery;  Laterality: N/A;  . SAVORY DILATION N/A 06/18/2013   Procedure: SAVORY DILATION;  Surgeon: Danie Binder, MD;  [facility];  Service: Endoscopy;  Laterality: N/A;  . TEE WITHOUT CARDIOVERSION N/A 10/04/2012   Procedure: TRANSESOPHAGEAL ECHOCARDIOGRAM (TEE);  Surgeon: Thayer Headings, MD;  Location: [facility] ENDOSCOPY;  Service: Cardiovascular;  Laterality: N/A;  . [place] accident  Both legs fractured and repaired surgically       Family History  Problem Relation Age of Onset  . Diabetes Mother   . Hypertension Mother   . Glaucoma Mother   . Retinal detachment Mother   . Hypertension Father   . Diabetes Brother     . Glaucoma Brother   . Colon polyps Neg Hx   . Colon cancer Neg Hx     Social History   Tobacco Use  . Smoking status: Former Smoker    Packs/day: 1.00    Years: 29.00    Pack years: 29.00    Types: Cigarettes    Quit date: 02/22/1992    Years since quitting: 27.0  . Smokeless tobacco: Never Used  Substance Use Topics  . Alcohol use: Not Currently    Comment: occasionally beer  . Drug use: Yes    Types: Marijuana, "Crack" cocaine    Comment: hx of crack cocaine "long time ago" per pt - hard to tell how long it is has been since he used,    Home Medications Prior to Admission medications   Medication Sig Start Date End Date Taking? Authorizing Provider  albuterol (PROAIR HFA) 108 (90 Base) MCG/ACT inhaler Inhale 2 puffs into the lungs every 4 (four) hours as needed for wheezing or shortness of breath.     [provider]  amLODipine (NORVASC) 5 MG tablet Take 5 mg by mouth daily.    [provider]  atorvastatin (LIPITOR) 10 MG tablet Take 10 mg by mouth daily. (cholesterol)    [provider]  atropine 1 % ophthalmic solution Place 1 drop into the left eye 2 (two) times daily. 12/18/18   Bernarda Caffey, MD  bacitracin-polymyxin b (POLYSPORIN) ophthalmic ointment Place 1 application into the left eye 4 (four) times daily. apply to eye every 12 hours while awake 10/23/18   Bernarda Caffey, MD  brimonidine [facility]) 0.2 % ophthalmic solution Place 1 drop into the left eye 3 (three) times daily. 12/04/18   Bernarda Caffey, MD  clopidogrel (PLAVIX) 75 MG tablet Take 75 mg by mouth daily.    [provider]  dorzolamide-timolol (COSOPT) 22.3-6.8 MG/ML ophthalmic solution INSTILL 1 DROP IN LEFT EYE THREE TIMES DAILY 03/04/19   Bernarda Caffey, MD  escitalopram (LEXAPRO) 10 MG tablet Take 10 mg by mouth daily.    [provider]  gatifloxacin (ZYMAXID) 0.5 % SOLN Place 1 drop into the left eye 4 (four) times daily.    [provider]   ibuprofen (ADVIL) 600 MG tablet Take 600 mg by mouth every 6 (six) hours as needed.    [provider]  IncobotulinumtoxinA ([name] IM) Inject 500 Units into the muscle every 3 (three) months.    [provider]  lamoTRIgine (LAMICTAL) 100 MG tablet Take two tablets twice daily. Patient taking differently: Take 200 mg by mouth 2 (two) times daily. Take two tablets twice daily. 09/18/18   Marcial Pacas, MD  latanoprost (XALATAN) 0.005 % ophthalmic solution Place 1 drop into the left eye at bedtime. 12/04/18 12/04/19  Bernarda Caffey, MD  losartan (COZAAR) 100 MG tablet Take 100 mg by mouth daily.    [provider]  omeprazole (PRILOSEC) 20 MG capsule Take 20 mg by mouth daily.    [provider]  prednisoLONE acetate (PRED FORTE) 1 % ophthalmic suspension Place 1 drop into the left eye 4 (four) times daily. 10/16/18   Bernarda Caffey, MD  tamsulosin (FLOMAX) 0.4 MG CAPS capsule Take 0.4 mg by mouth daily.    [provider]  traZODone (DESYREL) 50 MG tablet Take 50 mg by mouth at bedtime.    [provider]    Allergies    Hydrocodone, Shellfish allergy, Metformin, Contrast media [iodinated diagnostic agents], and Oxycodone  Review of Systems   Review of Systems  Constitutional: Negative for fever.  HENT: Negative for rhinorrhea and sinus pain.   Eyes: Negative for pain.  Respiratory: Negative for cough.   Cardiovascular: Negative for chest pain.  Gastrointestinal: Negative for nausea and vomiting.  Genitourinary: Negative for flank pain.  Musculoskeletal: Negative for back pain.  Skin: Negative for wound.  Neurological: Negative for weakness and numbness.       Hx intermittent headache, no current. Prior cva w residual left sided weakness, no new numbness/weakness.   Hematological: Negative for adenopathy.  Psychiatric/Behavioral: Negative for confusion.    Physical Exam Updated Vital Signs BP 136/85 (BP Location: Right Arm)   Pulse  78   Temp 98.2 F (36.8 C) (Oral)   Resp 12   Ht 1.626 m (5\' 4" )   Wt 66.7 kg   SpO2 98%   BMI 25.23 kg/m   Physical Exam Vitals and nursing note reviewed.  Constitutional:      Appearance: Normal appearance. He is well-developed.  HENT:     Head: Atraumatic.     Comments: No sinus or temporal tenderness.     Nose: Nose normal.  Mouth/Throat:     Mouth: Mucous membranes are moist.     Pharynx: Oropharynx is clear.  Eyes:     General: No scleral icterus.    Conjunctiva/sclera: Conjunctivae normal.     Comments: Left pupil, chronic changes. Right 3 mm, reactive.   Neck:     Trachea: No tracheal deviation.     Comments: No stiffness or rigidity Cardiovascular:     Rate and Rhythm: Normal rate and regular rhythm.     Pulses: Normal pulses.     Heart sounds: Normal heart sounds. No murmur. No friction rub. No gallop.   Pulmonary:     Effort: Pulmonary effort is normal. No accessory muscle usage or respiratory distress.     Breath sounds: Normal breath sounds.  Abdominal:     General: Bowel sounds are normal. There is no distension.     Palpations: Abdomen is soft.     Tenderness: There is no abdominal tenderness. There is no guarding.  Genitourinary:    Comments: No cva tenderness. Musculoskeletal:        General: No swelling.     Cervical back: Normal range of motion and neck supple. No rigidity.     Comments: Tenderness plantar aspect of base left great toe. Skin in region appears normal. No sts noted. Normal cap refill distally in toe. No pain/tenderness in nail region, no acute ingrown nail or infected nail noted. Dp/pt 2+.  Skin:    General: Skin is warm and dry.     Findings: No rash.  Neurological:     Mental Status: He is alert.     Comments: Alert, communicates via sign language interpreter. Motor intact bil. sens grossly intact bil.   Psychiatric:        Mood and Affect: Mood normal.     ED Results / Procedures / Treatments   Labs (all labs ordered  are listed, but only abnormal results are displayed) Labs Reviewed - No data to display  EKG None  Radiology DG Toe Great Left  Result Date: 03/17/2019 CLINICAL DATA:  Pain EXAM: LEFT GREAT TOE COMPARISON:  None. FINDINGS: There is no evidence of fracture or dislocation. There is no evidence of arthropathy or other focal bone abnormality. Soft tissues are unremarkable. IMPRESSION: Negative. Electronically Signed   By: Constance Holster M.D.   On: 03/17/2019 16:48    Procedures Procedures (including critical care time)  Medications Ordered in ED Medications - No data to display  ED Course  I have reviewed the triage vital signs and the nursing notes.  Pertinent labs & imaging results that were available during my care of the patient were reviewed by me and considered in my medical decision making (see chart for details).    MDM Rules/Calculators/A&P                      Sign language interpreter used.   Additional hx from pt/family member.  Reviewed nursing notes and prior charts for additional history.   Xrays of great toe reviewed/interpreted by me - no fracture.   Discussed xray results via interpreter as well as differential dx including occult fx/stress fx, tendonitis, ligament/tendon strain, neuroma, etc, and need for podiatry f/u (currently scheduled for first of Feb).   Ibuprofen po.   Patient current appears stable for d/c.   Return precautions provided.       Final Clinical Impression(s) / ED Diagnoses Final diagnoses:  None    Rx / DC Orders ED  Discharge Orders    None       Lajean Saver, MD 03/17/19 1712

## 2019-03-17 NOTE — ED Triage Notes (Signed)
C/o HA for a month and has NOT seen anyone for it.  Left great toe for about 3 weeks and seen PCP this month and instructed to see his podiatrist.  appt on Feb 1, unable to wait that long to be seen for his foot.

## 2019-03-18 ENCOUNTER — Ambulatory Visit (INDEPENDENT_AMBULATORY_CARE_PROVIDER_SITE_OTHER): Payer: Medicare Other | Admitting: Neurology

## 2019-03-18 ENCOUNTER — Encounter: Payer: Self-pay | Admitting: Neurology

## 2019-03-18 ENCOUNTER — Other Ambulatory Visit: Payer: Self-pay

## 2019-03-18 VITALS — BP 132/80 | HR 71 | Temp 97.5°F

## 2019-03-18 DIAGNOSIS — G40909 Epilepsy, unspecified, not intractable, without status epilepticus: Secondary | ICD-10-CM

## 2019-03-18 DIAGNOSIS — G8114 Spastic hemiplegia affecting left nondominant side: Secondary | ICD-10-CM | POA: Diagnosis not present

## 2019-03-18 DIAGNOSIS — I63311 Cerebral infarction due to thrombosis of right middle cerebral artery: Secondary | ICD-10-CM

## 2019-03-18 MED ORDER — INCOBOTULINUMTOXINA 100 UNITS IM SOLR
500.0000 [IU] | INTRAMUSCULAR | Status: DC
  Administered 2019-03-18: 500 [IU] via INTRAMUSCULAR

## 2019-03-18 NOTE — Progress Notes (Signed)
**  [name] 100 units x 5 vials, NDC 0259-1610-01, Lot [id number], Exp 08/2020, office supply.//mck,rn**

## 2019-03-18 NOTE — Progress Notes (Signed)
Chief Complaint  Patient presents with  . Left Spastic Hemiparesis    [name] 100 units x 5 vials - office supply      PATIENT: Charles [name] DOB: May 05, 1955  HISTORICAL  MOHD BOLSINGER is a 64 years old right-handed male, accompanied by his mother, and his brother, interpreter, for EMG guided Botox injection for his spastic left hemiparesis,I saw him first in Nov 2015, he was previously patients of Dr. Leonie Man, and Dr. Janann Colonel, who has performed EMG guided Botox injection in January, April, July 2015, for his spastic left hemiparesis, which he responded very well  He was born deaf, He had a history of pulmonary sarcoidosis, was treated with long-term low-dose steroid, also with past medical history of hypertension, diabetes, hyperlipidemia,  He suffered stroke in April 2014, was treated at [place], with residual severe left hemiparesis, per record  MRI of the brain showed remote right hemispheric infarct and small vessel disease type changes  MRA of brain abrupt  cutoff of flow at the right carotid terminus with nonvisualization of right middle cerebral artery branch vessels consistent with the patient's remote right hemispheric infarct. Fetal type origin of the posterior cerebral arteries with posterior cerebral artery supplied predominately from the anterior circulation. 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   2D Echocardiogram 60%, wall motion normal, LA normal size Carotid Doppler Bilateral: 1-39% ICA stenosis. Vertebral artery flow is antegrade  TEE no PFO, Pulm AVM  EEG in August 2014, normal  awake and asleep EEG. No focal or generalized epileptiform discharges noted   UPDATE May 8th 2017: He is with his mother and brother at today's clinical visit, last visit was November 25th 2015 for EMG guided Botox injection to his spastic left upper and lower extremity, he denies significant improvement with Botox injection.  Today he came in with a  new issue, complains a year history of intermittent right elbow discomfort, radiating pain to right arm, right hand, mainly involving right fifths, and fourth fingers, it happened with prolonged sitting, eating, or sleeping, he also noticed mild right hand weakness, difficulty to unscrew the bottle top. 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 He complains progressive worsening gait abnormality, falling few times, neck pain, low back pain, radiating pain to his right leg  UPDATE August 24 2015: He is accompanied by his mother brother and interpreter at today's clinical visit, he continue complains of low back pain radiating pain to right lower extremity, intermittent right arm hands paresthesia, worsening gait difficulty, urinary urgency,  We have personally reviewed MRI of cervical spine in June 2017: There is severe spinal stenosis at C3-C4 and at C4-C5 due to central disc protrusions/herniations, uncovertebral spurring, facet hypertrophy and congenitally short pedicles. There is subtle hyperintense signal within the spinal cord on sagittal STIR images just below the C3-C4 interspace but this is not confirmed on axial images. This could represent a mild cervical myelopathy. 2. There is moderate spinal stenosis at C5-C6 and C6-C7 mild spinal stenosis at C2-C3 to degenerative changes and congenitally short pedicles. 3. At C3-C4 there is moderately severe bilateral foraminal narrowing at could lead to impingement either of the C4 nerve roots. There does not appear to be nerve root  impingement at the other cervical levels  MRI of lumbar spine in June 2017: At L4-L5, there is broad disc bulging, moderately severe facet  hypertrophy, right greater than left, and minimal anterolisthesis. There is no nerve root compression at this level. When compared to the MRI dated 12/24/2012, the synovial cyst that was noted to the left on the prior MRI is no longer present. This relieves the left L5 nerve root impingement that was noted at that time. However, since the prior MRI there has been mild progression of the facet hypertrophy and minimal anterolisthesis . 2. There are milder degenerative changes at L3-L4 and L5-S1 that did not lead to any nerve root impingement, unchanged when compared to the previous MRI.  UPDATE Dec 03 2015: He had cervical decompression laminectomy C3-4-5 6 with foraminotomies of the C4-5 VI nerve roots by Dr. Saintclair Halsted on October 07 2015, patient reported significant improvement in his neck pain, family noticed he has difficulty sleeping, agitated.  I personally reviewed MRI cervical spine in September 2017: Posterior hardware fusion C3 through C7. Posterior decompression C3 through C5. Small posterior fluid collection in the soft tissues may represent postop fluid versus CSF leak. Small area of cord hyperintensity at the C4 level is unchanged compatible with chronic myelomalacia. No cord compression.  He complains of low back pain,  MRI of left femur there was no significant abnormality notice, MRI lumbar in June 2017, multilevel degenerative changes, but there was no significant canal or foraminal stenosis   UPDATE Jan 16th 2018: He complains of neck pain, using heating pad, stiff, limited range of motion, lean back to get relief, difficulty to get a comfortable position to sleep, he lost drip of his right hand,  Left leg pain when bearing weight, left leg tightness,   CAT scan of the brain in August 2017 showed large right MCA stroke  Update Jul 05 2016: He complains of worsening left-sided low back pain, unsteady gait, we have personally reviewed MRI of lumbar in June 2017, only mild degenerative  changes, there is no significant canal or foraminal stenosis.  He is taking Mobic/naproxen as needed, worsening spastic left hemiparesis, left hands in position   UPDATE June 5th 2018: He returned for a electronic stimulation guided xeomin injection for spastic left hemiparesis, was emphasize on left upper extremity today,  UPDATE Feb 08 2017: He did respond some to previous xeomin injection 500 units, still has significant left arm spasticity, weakness, left anterior thigh muscle spasm in the achy pain  UPDATE Jun 27 2017: He presented to the emergency room on April 22, 2017, complains of the headaches, apparently he was given cocktail of Compazine, Benadryl, Decadron,  CT head without contrast showed chronic right MCA stroke, no acute abnormality  Laboratory evaluation showed normal TSH, free T4, lipid panel, A1c was mildly elevated 6.0, MRI of left hip on April 26, 2017 showed mild marginal osteophytes on the femoral head, stable since 2017,  He was given right greater occipital nerve perineural steroid injection on June 19, 2017 by Dr. Rolla Flatten, his headache overall has much improved,  UPDATE August 30 2017: He now lives at nursing home, there is no recurrent seizure, he is not taking lower dose of lamotrigine 100 mg daily, responding well to previous EMG guided xeomin injection.  UPDATE Nov 29 2017: He was not sure about the benefit from previous injection, no recurrent seizure, was noted to have left anterior shin area swelling,  UPDATE Jan 9th 2020: He presented to emergency  room on February 25, 2018 for increased aggressive behavior at nursing home noncompliant with his medications, instead of continual lamotrigine, he was given a new list of uncollected medications Keppra 500 mg twice daily, which was known to cause increased agitation for him in the past,  We went over his polypharmacy in detail, will change him back to lamotrigine, also check the lamotrigine level for compliance,  received 500 units of [name] injection for spastic left upper extremity today  He was noted to have worsening confusion, we personally reviewed CT scan in September 2019: No acute abnormality, large right MCA stroke encephalomalacia, extensive periventricular small vessel disease  UPDATE September 12 2018: He is doing well, has no recurrent seizure, but complains of frequent headaches, over-the-counter medicine does not help, lateralized mild to moderate headache, with light noise sensitivity, but made it worse, resting was helpful, lasting half day to whole day  he is discharged from nursing home to be with his brother because of his behavior issues, he was on Keppra and lamotrigine, Keppra caused emotional outburst, will stop the Keppra, change to lamotrigine 100 mg 2 tablets twice a day,  Laboratory evaluation in January 2020, lamotrigine was not detected, Keppra was 17.9, normal B12, CMP showed elevated glucose 165, calcium of 8.5, negative troponin  UPDATE Dec 03 2018: He responded well to previous injection, there was no recurrent seizure,  Update March 18, 2019: I reviewed his ophthalmology evaluation on February 12, 2019: Elevated inferotemporal mass OS was resolved heme, 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 Hydrocodone Other (See Comments)    Makes "looney"  . Shellfish Allergy Hives and Swelling  . Metformin Diarrhea  . Contrast Media [Iodinated Diagnostic Agents] Itching  . Oxycodone Nausea And Vomiting    HOME MEDICATIONS: Current Outpatient Medications on File  Prior to Visit  Medication Sig Dispense Refill  . albuterol (PROAIR HFA) 108 (90 Base) MCG/ACT inhaler Inhale 2 puffs into the lungs every 4 (four) hours as needed for wheezing or shortness of breath.     [name] amLODipine (NORVASC) 5 MG tablet Take 5 mg by mouth daily.    [name] atorvastatin (LIPITOR) 10 MG tablet Take 10 mg by mouth daily. (cholesterol)    . atropine 1 % ophthalmic solution Place 1 drop into the left eye 2 (two) times daily. 5 mL 1  . bacitracin-polymyxin b (POLYSPORIN) ophthalmic ointment Place 1 application into the left eye 4 (four) times daily. apply to eye every 12 hours while awake 3.5 g 6  . brimonidine (ALPHAGAN) 0.2 % ophthalmic solution Place 1 drop into the left eye 3 (three) times daily. 10 mL 3  . clopidogrel (PLAVIX) 75 MG tablet Take 75 mg by mouth daily.    . dorzolamide-timolol (COSOPT) 22.3-6.8 MG/ML ophthalmic solution INSTILL 1 DROP IN LEFT EYE THREE TIMES DAILY 10 mL 3  . escitalopram (LEXAPRO) 10 MG tablet Take 10 mg by mouth daily.    [name] gatifloxacin (ZYMAXID) 0.5 % SOLN Place 1 drop into the left eye 4 (four) times daily.    [name] ibuprofen (ADVIL) 600 MG tablet Take 600 mg by mouth every 6 (six)  hours as needed.    . IncobotulinumtoxinA ([name] IM) Inject 500 Units into the muscle every 3 (three) months.    . lamoTRIgine (LAMICTAL) 100 MG tablet Take two tablets twice daily. (Patient taking differently: Take 200 mg by mouth 2 (two) times daily. Take two tablets twice daily.) 360 tablet 4  . latanoprost (XALATAN) 0.005 % ophthalmic solution Place 1 drop into the left eye at bedtime. 2.5 mL 1  . losartan (COZAAR) 100 MG tablet Take 100 mg by mouth daily.    [name] omeprazole (PRILOSEC) 20 MG capsule Take 20 mg by mouth daily.    . prednisoLONE acetate (PRED FORTE) 1 % ophthalmic suspension Place 1 drop into the left eye 4 (four) times daily. 15 mL 0  . tamsulosin (FLOMAX) 0.4 MG CAPS capsule Take 0.4 mg by mouth daily.    . traZODone (DESYREL) 50 MG tablet Take 50 mg by  mouth at bedtime.     No current facility-administered medications on file prior to visit.    PAST MEDICAL HISTORY: Past Medical History:  Diagnosis Date  . Anemia   . Anxiety   . Asthma   . Deaf   . Depression   . Essential hypertension   . GERD (gastroesophageal reflux disease)   . History of migraine   . History of stroke    Spastic hemiplegia, right MCA stroke April 2014 in [place]  . Mixed hyperlipidemia   . PAD (peripheral artery disease) ([place])   . Sarcoidosis   . Seizures ([place])    Last one over 2 yrs ago in 2018, controlled with med  . Shingles   . Type 2 diabetes mellitus ([place])     PAST SURGICAL HISTORY: Past Surgical History:  Procedure Laterality Date  . AIR/FLUID EXCHANGE Left 10/04/2018   Procedure: Air/Fluid Exchange;  Surgeon: Bernarda Caffey, MD;  Location: [facility];  Service: Ophthalmology;  Laterality: Left;  . BRAVO PH STUDY N/A 06/18/2013   pH STUDY SHOWS NEXIUM TWICE DAILY CONTROLS THE ACID IN HIS STOMACH. HE HAD VERY FEWEPISODE OF REGURGITATION RECORDED IN THE 2 DAYS THE STUDY WAS PERFORMED.   [name] COLONOSCOPY  2011   IN [place]  . 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   . ESOPHAGOGASTRODUODENOSCOPY N/A 05/05/2015   Procedure: ESOPHAGOGASTRODUODENOSCOPY (EGD);  Surgeon: Danie Binder, MD;  [facility];  Service: Endoscopy;  Laterality: N/A;  730  . INJECTION OF SILICONE OIL Left [id number]   Procedure: Injection Of Silicone Oil;  Surgeon: Bernarda Caffey, MD;  Location: [facility];  Service: Ophthalmology;  Laterality: Left;  . LASER PHOTO ABLATION Left 10/04/2018   Procedure: Laser Photo Ablation;  Surgeon: Bernarda Caffey, MD;  Location: [facility];  Service:  Ophthalmology;  Laterality: Left;  [name] MALONEY DILATION N/A 06/18/2013   Procedure: MALONEY DILATION;  Surgeon: Danie Binder, MD;  [facility];  Service: Endoscopy;  Laterality: N/A;  . OTHER SURGICAL HISTORY     cyst removal head and chest  . PARS PLANA VITRECTOMY Left 10/04/2018   Procedure: PARS PLANA VITRECTOMY WITH 25 GAUGE;  Surgeon: Bernarda Caffey, MD;  Location: [facility];  Service: Ophthalmology;  Laterality: Left;  . POSTERIOR CERVICAL FUSION/FORAMINOTOMY N/A 10/07/2015   Procedure: Cervical Three-Four, Cervical Four-Five, Cervical Five-Six, Cervical Six-Seven  Posterior Cervical Laminectomy and Fusion;  Surgeon: Kary Kos, MD;  Location: [facility];  Service: Neurosurgery;  Laterality: N/A;  . SAVORY DILATION N/A 06/18/2013   Procedure: SAVORY DILATION;  Surgeon: Danie Binder, MD;  [facility];  Service: Endoscopy;  Laterality: N/A;  . TEE WITHOUT CARDIOVERSION N/A 10/04/2012   Procedure: TRANSESOPHAGEAL ECHOCARDIOGRAM (TEE);  Surgeon: Thayer Headings, MD;  Location[facility];  Service: Cardiovascular;  Laterality: N/A;  . [place] accident  Both legs fractured and repaired surgically    FAMILY HISTORY: Family History  Problem Relation Age of Onset  . Diabetes Mother   . Hypertension Mother   . Glaucoma Mother   . Retinal detachment Mother   . Hypertension Father   . Diabetes Brother   . Glaucoma Brother   . Colon polyps Neg Hx   . Colon cancer Neg Hx     SOCIAL HISTORY:  Social History   Socioeconomic History  . Marital status: Single    Spouse name: Not on file  . Number of children: 0  . Years of education: HS  . Highest education level: Not on file  Occupational History  . Occupation: Disabled  Tobacco Use  . Smoking status: Former Smoker    Packs/day: 1.00    Years: 29.00    Pack years: 29.00    Types: Cigarettes    Quit date: 02/22/1992    Years since quitting: 27.0  . Smokeless tobacco: Never Used    Substance and Sexual Activity  . Alcohol use: Not Currently    Comment: occasionally beer  . Drug use: Yes    Types: Marijuana, "Crack" cocaine    Comment: hx of crack cocaine "long time ago" per pt - hard to tell how long it is has been since he used,  . Sexual activity: Not on file  Other Topics Concern  . Not on file  Social History Narrative   Patient lives at home with his family. Brother, sister, mother    Patient does not work.    Patient has a high school education.    Patient has one step child    Social Determinants of Health   Financial Resource Strain:   . Difficulty of Paying Living Expenses: Not on file  Food Insecurity:   . Worried About Charity fundraiser in the Last Year: Not on file  . Ran Out of Food in the Last Year: Not on file  Transportation Needs:   . Lack of Transportation (Medical): Not on file  . Lack of Transportation (Non-Medical): Not on file  Physical Activity:   . Days of Exercise per Week: Not on file  . Minutes of Exercise per Session: Not on file  Stress:   . Feeling of Stress : Not on file  Social Connections:   . Frequency of Communication with Friends and Family: Not on file  . Frequency of Social Gatherings with Friends and Family: Not on file  . Attends Religious Services: Not on file  . Active Member of Clubs or Organizations: Not on file  . Attends Archivist Meetings: Not on file  . Marital Status: Not on file  Intimate Partner Violence:   . Fear of Current or Ex-Partner: Not on file  . Emotionally Abused: Not on file  . Physically Abused: Not on file  . Sexually Abused: Not on file     PHYSICAL EXAM   Vitals:   03/18/19 [id number]  BP: 132/80  Pulse: 71  Temp: (!) 97.5 F (36.4 C)    Not recorded      There is no height or weight on file to calculate BMI.   PHYSICAL EXAMNIATION:    MOTOR: Spastic left hemiparesis, wearing left ankle brace, mild left shoulder antigravity movement left shoulder abduction  with passive movement maximum 90, left elbow 170, complains of left elbow pain with passive movement, left thumb in position, left hip flexion 3, left knee extension 4, left knee flexion 4, he also has mild right ankle dorsiflexion weakness.    Lab Results  Component Value Date   WBC 4.6 10/04/2018   HGB 13.6 10/04/2018   HCT 40.2 10/04/2018   MCV 90.3 10/04/2018   PLT 103 (L) 10/04/2018      Component Value Date/Time   NA 138 10/04/2018 1316   K 4.2 10/04/2018 1316   CL 102 10/04/2018 1316   CO2 24 10/04/2018 1316   GLUCOSE 106 (H) 10/04/2018 1316   BUN 12 10/04/2018 1316   CREATININE 1.02 10/04/2018 1316   CALCIUM 8.9 10/04/2018 1316   PROT 6.9 06/18/2018 1916   ALBUMIN 4.2 06/18/2018 1916   AST 23 06/18/2018 1916   ALT 18 06/18/2018 1916   ALKPHOS 75 06/18/2018 1916   BILITOT 0.7 06/18/2018 1916   GFRNONAA >60 10/04/2018 1316   GFRAA >60 10/04/2018 1316   Lab Results  Component Value Date   CHOL 160 09/13/2016   HDL 59 09/13/2016   LDLCALC 43 09/13/2016   TRIG 288 (H) 09/13/2016   CHOLHDL 2.7 09/13/2016   Lab Results  Component Value Date   HGBA1C 6.0 (H) 06/13/2017   Lab Results  Component Value Date   [id number] 03/01/2018   Lab Results  Component Value Date   TSH 1.83 06/13/2017      ASSESSMENT AND PLAN  HENDRICK SCHIELKE is a 64 y.o. male  Congenital deaf, use sign language, history is through interpreter   Cervical spondylitic myelopathy  Status post cervical decompression surgery in August 2017.  History of seizure,  Difficulty tolerating Keppra, with increaseed aggressive behavior, ED presentation on February 25, 2017,  Continue lamotrigine 100 mg 2 tablets twice a day  Worsening memory loss  Is most consistent with his large size right MCA stroke, encephalomalacia, extensive periventricular small vessel disease  Normal B12, TSH  New development of left retinal hemorrhage versus vascular tumor, is followed up by [facility] ophthalmologist,  continue to monitor him for now  History of stroke, with residual spastic left hemiparesis   Continue electrical stimulation guided xeomin injection, used 500 units  Left pectoralis major 50 units Left Latissimus dorsi 50 units  Left brachialis 50 units Left pronator teres 50 units Left flexor digitorum profundi 75 units Left flexor digitorum superficialis 50 units  Left opponents 25  units Left palmaris longus 50 units Left flexor carpi ulnaris 50 units Left flexor carpi radialis 50 units    Marcial Pacas, M.D. Ph.D.  [facility] [address], [place], [place] [address] [phone]

## 2019-03-19 ENCOUNTER — Other Ambulatory Visit (INDEPENDENT_AMBULATORY_CARE_PROVIDER_SITE_OTHER): Payer: Self-pay | Admitting: Ophthalmology

## 2019-03-19 DIAGNOSIS — I10 Essential (primary) hypertension: Secondary | ICD-10-CM | POA: Diagnosis not present

## 2019-03-20 DIAGNOSIS — Z789 Other specified health status: Secondary | ICD-10-CM | POA: Diagnosis not present

## 2019-03-20 DIAGNOSIS — Z299 Encounter for prophylactic measures, unspecified: Secondary | ICD-10-CM | POA: Diagnosis not present

## 2019-03-20 DIAGNOSIS — D869 Sarcoidosis, unspecified: Secondary | ICD-10-CM | POA: Diagnosis not present

## 2019-03-20 DIAGNOSIS — I639 Cerebral infarction, unspecified: Secondary | ICD-10-CM | POA: Diagnosis not present

## 2019-03-20 DIAGNOSIS — E1165 Type 2 diabetes mellitus with hyperglycemia: Secondary | ICD-10-CM | POA: Diagnosis not present

## 2019-03-20 DIAGNOSIS — I10 Essential (primary) hypertension: Secondary | ICD-10-CM | POA: Diagnosis not present

## 2019-03-20 DIAGNOSIS — Z6830 Body mass index (BMI) 30.0-30.9, adult: Secondary | ICD-10-CM | POA: Diagnosis not present

## 2019-03-25 ENCOUNTER — Ambulatory Visit (INDEPENDENT_AMBULATORY_CARE_PROVIDER_SITE_OTHER): Payer: Medicare Other | Admitting: Podiatry

## 2019-03-25 ENCOUNTER — Other Ambulatory Visit: Payer: Self-pay

## 2019-03-25 ENCOUNTER — Encounter: Payer: Self-pay | Admitting: Podiatry

## 2019-03-25 DIAGNOSIS — E119 Type 2 diabetes mellitus without complications: Secondary | ICD-10-CM | POA: Diagnosis not present

## 2019-03-25 DIAGNOSIS — B351 Tinea unguium: Secondary | ICD-10-CM

## 2019-03-25 DIAGNOSIS — M79609 Pain in unspecified limb: Secondary | ICD-10-CM

## 2019-03-25 NOTE — Progress Notes (Signed)
Patient ID: Charles [name], male   DOB: 09/13/1955, 64 y.o.   MRN: [id number] Complaint:  Visit Type: Patient returns to my office for continued preventative foot care services. Complaint: Patient states" my nails have grown long and thick and become painful to walk and wear shoes" Patient has been diagnosed with DM with no foot complications. The patient presents for preventative foot care services. No changes to ROS  Podiatric Exam: Vascular: dorsalis pedis and posterior tibial pulses are palpable bilateral. Capillary return is immediate. Temperature gradient is WNL. Skin turgor WNL  Sensorium: Normal Semmes Weinstein monofilament test. Normal tactile sensation bilaterally. Nail Exam: Pt has thick disfigured discolored nails with subungual debris noted bilateral entire nail hallux  Ulcer Exam: There is no evidence of ulcer or pre-ulcerative changes or infection. Orthopedic Exam: Muscle tone and strength are WNL. No limitations in general ROM. No crepitus or effusions noted. Foot type and digits show no abnormalities. Bony prominences are unremarkable. Skin: No Porokeratosis. No infection or ulcers  Diagnosis:  Onychomycosis, , Pain in right toe, pain in left toes  Treatment & Plan Procedures and Treatment: Consent by patient was obtained for treatment procedures. The patient understood the discussion of treatment and procedures well. All questions were answered thoroughly reviewed. Debridement of mycotic and hypertrophic toenails, 1 through 5 bilateral and clearing of subungual debris. No ulceration, no infection noted.  Charles [name] his brother said that his brother Charles [name] says he has occasional pain.  No evidence of inflammaton noted.  No X-ray was taken. Return Visit-Office Procedure: Patient instructed to return to the office for a follow up visit 3 months for continued evaluation and treatment.   Gardiner Barefoot DPM

## 2019-03-28 ENCOUNTER — Emergency Department (HOSPITAL_COMMUNITY)
Admission: EM | Admit: 2019-03-28 | Discharge: 2019-03-28 | Disposition: A | Discharge status: Home / Self Care | Payer: Medicare Other | Attending: Emergency Medicine | Admitting: Emergency Medicine

## 2019-03-28 ENCOUNTER — Telehealth (INDEPENDENT_AMBULATORY_CARE_PROVIDER_SITE_OTHER): Payer: Self-pay

## 2019-03-28 ENCOUNTER — Encounter (HOSPITAL_COMMUNITY): Payer: Self-pay | Admitting: Emergency Medicine

## 2019-03-28 ENCOUNTER — Emergency Department (HOSPITAL_COMMUNITY): Payer: Medicare Other

## 2019-03-28 ENCOUNTER — Other Ambulatory Visit: Payer: Self-pay

## 2019-03-28 DIAGNOSIS — Z7902 Long term (current) use of antithrombotics/antiplatelets: Secondary | ICD-10-CM | POA: Diagnosis not present

## 2019-03-28 DIAGNOSIS — I63311 Cerebral infarction due to thrombosis of right middle cerebral artery: Secondary | ICD-10-CM | POA: Insufficient documentation

## 2019-03-28 DIAGNOSIS — J449 Chronic obstructive pulmonary disease, unspecified: Secondary | ICD-10-CM | POA: Insufficient documentation

## 2019-03-28 DIAGNOSIS — G4489 Other headache syndrome: Secondary | ICD-10-CM | POA: Diagnosis not present

## 2019-03-28 DIAGNOSIS — R519 Headache, unspecified: Secondary | ICD-10-CM | POA: Insufficient documentation

## 2019-03-28 DIAGNOSIS — J45909 Unspecified asthma, uncomplicated: Secondary | ICD-10-CM | POA: Diagnosis not present

## 2019-03-28 DIAGNOSIS — Z79899 Other long term (current) drug therapy: Secondary | ICD-10-CM | POA: Diagnosis not present

## 2019-03-28 DIAGNOSIS — Z87891 Personal history of nicotine dependence: Secondary | ICD-10-CM | POA: Diagnosis not present

## 2019-03-28 DIAGNOSIS — F332 Major depressive disorder, recurrent severe without psychotic features: Secondary | ICD-10-CM | POA: Diagnosis not present

## 2019-03-28 DIAGNOSIS — I10 Essential (primary) hypertension: Secondary | ICD-10-CM | POA: Insufficient documentation

## 2019-03-28 DIAGNOSIS — E119 Type 2 diabetes mellitus without complications: Secondary | ICD-10-CM | POA: Insufficient documentation

## 2019-03-28 MED ORDER — ACETAMINOPHEN 500 MG PO TABS
1000.0000 mg | ORAL_TABLET | Freq: Once | ORAL | Status: AC
  Administered 2019-03-28: 1000 mg via ORAL
  Filled 2019-03-28: qty 2

## 2019-03-28 NOTE — ED Provider Notes (Signed)
[facility] EMERGENCY DEPARTMENT Provider Note   CSN: [id number] Arrival date & time: 03/28/19  1100     History Chief Complaint  Patient presents with  . Headache    Charles [name] is a 64 y.o. male.  Patient complains of a headache.  It started this morning and it is in the left temporal it is mild to moderate.  Family was concerned about a stroke.  The history is provided by the patient and the EMS personnel. No language interpreter was used.  Headache Pain location:  L temporal Quality:  Dull Radiates to:  Does not radiate Severity currently:  4/10 Onset quality:  Sudden Timing:  Constant Progression:  Waxing and waning Chronicity:  New Similar to prior headaches: no   Relieved by:  Nothing Associated symptoms: no abdominal pain, no back pain, no congestion, no cough, no diarrhea, no fatigue, no seizures and no sinus pressure        Past Medical History:  Diagnosis Date  . Anemia   . Anxiety   . Asthma   . Deaf   . Depression   . Essential hypertension   . GERD (gastroesophageal reflux disease)   . History of migraine   . History of stroke    Spastic hemiplegia, right MCA stroke April 2014 in [place]  . Mixed hyperlipidemia   . PAD (peripheral artery disease) ([place])   . Sarcoidosis   . Seizures ([place])    Last one over 2 yrs ago in 2018, controlled with med  . Shingles   . Type 2 diabetes mellitus [facility])     Patient Active Problem List   Diagnosis Date Noted  . Cerebrovascular accident (CVA) due to thrombosis of right middle cerebral artery ([place]) 03/18/2019  . Chronic nonintractable headache 09/12/2018  . Chronic migraine 09/12/2018  . Cerebrovascular accident (CVA) ([place]) 03/01/2018  . Polypharmacy 03/01/2018  . 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 Nonintractable headache 06/27/2017  . Mixed hyperlipidemia 05/17/2017  . Uncontrolled type 2 diabetes mellitus  with hyperglycemia ([place]) 05/16/2017  . Thrombocytopenia ([place]) 09/13/2016  . Hypoglycemia   . Syncope 09/12/2016  . Seizure disorder as sequela of cerebrovascular accident ([place]) 09/12/2016  . MDD (major depressive disorder), recurrent episode, severe ([place]) 12/30/2015  . Suicidal ideation   . Other pancytopenia ([place]) 12/23/2015  . Loss of weight 11/12/2015  . 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 Iron deficiency anemia 09/22/2015  . Spinal stenosis in cervical region 08/21/2015  . Neck pain 08/12/2015  . Low back pain 08/12/2015  . Abnormality of gait 08/12/2015  . Neuropathy of right upper extremity 06/29/2015  . Acute rhinitis 05/05/2015  . Abdominal pain, epigastric 04/23/2015  . Arm pain, right 04/23/2015  . Atypical chest pain 07/23/2013  . 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 Dyspepsia 06/13/2013  . Dysphagia 06/13/2013  . Headache 11/30/2012  . Solitary pulmonary nodule 11/20/2012  . Spastic hemiplegia affecting left nondominant side ([place]) 11/08/2012  . Essential hypertension, benign 10/12/2012  . COPD GOLD II with reversibility  10/12/2012  . 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 CVA (cerebral vascular accident) ([place]) 10/03/2012  . Diabetes ([place]) 10/03/2012  . Sarcoidosis ([place]) 10/03/2012    Past Surgical History:  Procedure Laterality Date  . AIR/FLUID EXCHANGE Left 10/04/2018   Procedure: Air/Fluid Exchange;  Surgeon: Bernarda Caffey, MD;  Location: [place];  Service: Ophthalmology;  Laterality: Left;  . BRAVO PH STUDY N/A 06/18/2013   pH STUDY  SHOWS NEXIUM TWICE DAILY CONTROLS THE ACID IN HIS STOMACH. HE HAD VERY FEWEPISODE OF REGURGITATION RECORDED IN THE 2 DAYS THE STUDY WAS PERFORMED.   [name] COLONOSCOPY  2011   IN [place]  . ESOPHAGOGASTRODUODENOSCOPY N/A 06/18/2013   Dr.Fields- probable proximal esophageal web,dilation performed, bravo cap placed, mild non-erosive gastritis in the gastric antrum and on the greater curvature of the gastric body bx-  granulomatous gastritis, duodenal mucosa showed no abnormalities in the bulb and second portion of the duodenum.   . ESOPHAGOGASTRODUODENOSCOPY N/A 05/05/2015   Procedure: ESOPHAGOGASTRODUODENOSCOPY (EGD);  Surgeon: Danie Binder, MD;  [facility];  Service: Endoscopy;  Laterality: N/A;  730  . INJECTION OF SILICONE OIL Left [id number]   Procedure: Injection Of Silicone Oil;  Surgeon: Bernarda Caffey, MD;  Location: [place];  Service: Ophthalmology;  Laterality: Left;  . LASER PHOTO ABLATION Left 10/04/2018   Procedure: Laser Photo Ablation;  Surgeon: Bernarda Caffey, MD;  Location: [place];  Service: Ophthalmology;  Laterality: Left;  [name] MALONEY DILATION N/A 06/18/2013   Procedure: MALONEY DILATION;  Surgeon: Danie Binder, MD;  [facility];  Service: Endoscopy;  Laterality: N/A;  . OTHER SURGICAL HISTORY     cyst removal head and chest  . PARS PLANA VITRECTOMY Left 10/04/2018   Procedure: PARS PLANA VITRECTOMY WITH 25 GAUGE;  Surgeon: Bernarda Caffey, MD;  Location: [place];  Service: Ophthalmology;  Laterality: Left;  . POSTERIOR CERVICAL FUSION/FORAMINOTOMY N/A 10/07/2015   Procedure: Cervical Three-Four, Cervical Four-Five, Cervical Five-Six, Cervical Six-Seven Posterior Cervical Laminectomy and Fusion;  Surgeon: Kary Kos, MD;  Location: [place] [facility];  Service: Neurosurgery;  Laterality: N/A;  . SAVORY DILATION N/A 06/18/2013   Procedure: SAVORY DILATION;  Surgeon: Danie Binder, MD;  [facility];  Service: Endoscopy;  Laterality: N/A;  . TEE WITHOUT CARDIOVERSION N/A 10/04/2012   Procedure: TRANSESOPHAGEAL ECHOCARDIOGRAM (TEE);  Surgeon: Thayer Headings, MD;  Location[facility];  Service: Cardiovascular;  Laterality: N/A;  . [place] accident  Both legs fractured and repaired surgically       Family History  Problem Relation Age of Onset  . Diabetes Mother   . Hypertension Mother   . Glaucoma Mother   . Retinal detachment Mother    . Hypertension Father   . Diabetes Brother   . Glaucoma Brother   . Colon polyps Neg Hx   . Colon cancer Neg Hx     Social History   Tobacco Use  . Smoking status: Former Smoker    Packs/day: 1.00    Years: 29.00    Pack years: 29.00    Types: Cigarettes    Quit date: 02/22/1992    Years since quitting: 27.1  . Smokeless tobacco: Never Used  Substance Use Topics  . Alcohol use: Not Currently    Comment: occasionally beer  . Drug use: Yes    Types: Marijuana, "Crack" cocaine    Comment: hx of crack cocaine "long time ago" per pt - hard to tell how long it is has been since he used,    Home Medications Prior to Admission medications   Medication Sig Start Date End Date Taking? Authorizing Provider  albuterol (PROAIR HFA) 108 (90 Base) MCG/ACT inhaler Inhale 2 puffs into the lungs every 4 (four) hours as needed for wheezing or shortness of breath.     [provider]  amLODipine (NORVASC) 5 MG tablet Take 5 mg by mouth daily.  [provider]  atorvastatin (LIPITOR) 10 MG tablet Take 10 mg by mouth daily. (cholesterol)    [provider]  atropine 1 % ophthalmic solution INSTILL 1 DROP IN LEFT EYE TWICE DAILY 03/24/19   Bernarda Caffey, MD  bacitracin-polymyxin b (POLYSPORIN) ophthalmic ointment Place 1 application into the left eye 4 (four) times daily. apply to eye every 12 hours while awake 10/23/18   Bernarda Caffey, MD  brimonidine [facility]) 0.2 % ophthalmic solution Place 1 drop into the left eye 3 (three) times daily. 12/04/18   Bernarda Caffey, MD  clopidogrel (PLAVIX) 75 MG tablet Take 75 mg by mouth daily.    [provider]  dorzolamide-timolol (COSOPT) 22.3-6.8 MG/ML ophthalmic solution INSTILL 1 DROP IN LEFT EYE THREE TIMES DAILY 03/04/19   Bernarda Caffey, MD  escitalopram (LEXAPRO) 10 MG tablet Take 10 mg by mouth daily.    [provider]  gatifloxacin (ZYMAXID) 0.5 % SOLN Place 1 drop into the left eye 4 (four) times daily.     [provider]  ibuprofen (ADVIL) 600 MG tablet Take 600 mg by mouth every 6 (six) hours as needed.    [provider]  IncobotulinumtoxinA ([name] IM) Inject 500 Units into the muscle every 3 (three) months.    [provider]  lamoTRIgine (LAMICTAL) 100 MG tablet Take two tablets twice daily. Patient taking differently: Take 200 mg by mouth 2 (two) times daily. Take two tablets twice daily. 09/18/18   Marcial Pacas, MD  latanoprost (XALATAN) 0.005 % ophthalmic solution Place 1 drop into the left eye at bedtime. 12/04/18 12/04/19  Bernarda Caffey, MD  losartan (COZAAR) 100 MG tablet Take 100 mg by mouth daily.    [provider]  omeprazole (PRILOSEC) 20 MG capsule Take 20 mg by mouth daily.    [provider]  prednisoLONE acetate (PRED FORTE) 1 % ophthalmic suspension Place 1 drop into the left eye 4 (four) times daily. 10/16/18   Bernarda Caffey, MD  tamsulosin (FLOMAX) 0.4 MG CAPS capsule Take 0.4 mg by mouth daily.    [provider]  traZODone (DESYREL) 50 MG tablet Take 50 mg by mouth at bedtime.    [provider]    Allergies    Hydrocodone, Shellfish allergy, Metformin, Contrast media [iodinated diagnostic agents], and Oxycodone  Review of Systems   Review of Systems  Constitutional: Negative for appetite change and fatigue.  HENT: Negative for congestion, ear discharge and sinus pressure.   Eyes: Negative for discharge.  Respiratory: Negative for cough.   Cardiovascular: Negative for chest pain.  Gastrointestinal: Negative for abdominal pain and diarrhea.  Genitourinary: Negative for frequency and hematuria.  Musculoskeletal: Negative for back pain.  Skin: Negative for rash.  Neurological: Positive for headaches. Negative for seizures.  Psychiatric/Behavioral: Negative for hallucinations.    Physical Exam Updated Vital Signs BP (!) 166/112   Pulse 86   Resp (!) 21   Ht 5\' 4"  (1.626 m)   Wt 74.8 kg   SpO2 98%    BMI 28.32 kg/m   Physical Exam Vitals and nursing note reviewed.  Constitutional:      Appearance: Normal appearance. He is well-developed.  HENT:     Head: Normocephalic.     Comments: Mild tenderness left temporal    Nose: Nose normal.  Eyes:     General: No scleral icterus.    Conjunctiva/sclera: Conjunctivae normal.  Neck:     Thyroid: No thyromegaly.  Cardiovascular:     Rate and  Rhythm: Normal rate and regular rhythm.     Heart sounds: No murmur. No friction rub. No gallop.   Pulmonary:     Breath sounds: No stridor. No wheezing or rales.  Chest:     Chest wall: No tenderness.  Abdominal:     General: There is no distension.     Tenderness: There is no abdominal tenderness. There is no rebound.  Musculoskeletal:        General: No deformity.     Cervical back: Neck supple.  Lymphadenopathy:     Cervical: No cervical adenopathy.  Skin:    Findings: No erythema or rash.  Neurological:     Mental Status: He is alert and oriented to person, place, and time.     Motor: No abnormal muscle tone.     Coordination: Coordination normal.     Comments: Patient has an old stroke and has weakness to left arm and left leg  Psychiatric:        Behavior: Behavior normal.     ED Results / Procedures / Treatments   Labs (all labs ordered are listed, but only abnormal results are displayed) Labs Reviewed - No data to display  EKG None  Radiology CT Head Wo Contrast  Result Date: 03/28/2019 CLINICAL DATA:  64 year old male with a few days of headaches. Progressive. EXAM: CT HEAD WITHOUT CONTRAST TECHNIQUE: Contiguous axial images were obtained from the base of the skull through the vertex without intravenous contrast. COMPARISON:  Brain MRI 03/22/2018. Head CT 06/18/2018. FINDINGS: Brain: Stable cerebral volume. Right MCA territory encephalomalacia with atrophy of the right deep gray nuclei, Wallerian degeneration in the brainstem, and ex vacuo enlargement of the right lateral  ventricle - stable. Additional bilateral Patchy and confluent cerebral white matter hypodensity. No midline shift, mass effect, or evidence of intracranial mass lesion. No acute intracranial hemorrhage identified. No cortically based acute infarct identified. Vascular: Calcified atherosclerosis at the skull base. No suspicious intracranial vascular hyperdensity. Skull: Chronic right lamina papyracea fracture. No acute osseous abnormality identified. Sinuses/Orbits: Visualized paranasal sinuses and mastoids are stable and well pneumatized. Other: Postoperative changes to the left globe since last year. No acute orbit or scalp soft tissue finding. IMPRESSION: 1. No acute intracranial abnormality. 2. Chronic extensive right MCA territory encephalomalacia and evidence of superimposed small vessel disease. Electronically Signed   By: Genevie Ann M.D.   On: 03/28/2019 11:56    Procedures Procedures (including critical care time)  Medications Ordered in ED Medications  acetaminophen (TYLENOL) tablet 1,000 mg (1,000 mg Oral Given 03/28/19 1202)    ED Course  I have reviewed the triage vital signs and the nursing notes.  Pertinent labs & imaging results that were available during my care of the patient were reviewed by me and considered in my medical decision making (see chart for details).    MDM Rules/Calculators/A&P                      CT scan negative.  Patient improved with Tylenol.  He will be discharged home with a mild headache Final Clinical Impression(s) / ED Diagnoses Final diagnoses:  Bad headache    Rx / DC Orders ED Discharge Orders    None       Milton Ferguson, MD 03/28/19 1301

## 2019-03-28 NOTE — ED Triage Notes (Signed)
PT brought in by [place] today from his residence. PT's brother called EMS for c/o headaches over the past few days and worsening today unrelieved by Tylenol. PT had CVA in 2014 and hemiplegia on the left with dysphagia. PT is very hard of hearing.

## 2019-03-28 NOTE — Discharge Instructions (Addendum)
Take Tylenol for pain and follow-up with your family doctor next week

## 2019-03-29 DIAGNOSIS — Z23 Encounter for immunization: Secondary | ICD-10-CM | POA: Diagnosis not present

## 2019-04-02 DIAGNOSIS — C6932 Malignant neoplasm of left choroid: Secondary | ICD-10-CM | POA: Diagnosis not present

## 2019-04-02 DIAGNOSIS — H3589 Other specified retinal disorders: Secondary | ICD-10-CM | POA: Diagnosis not present

## 2019-04-16 ENCOUNTER — Telehealth (INDEPENDENT_AMBULATORY_CARE_PROVIDER_SITE_OTHER): Payer: Self-pay

## 2019-04-21 DIAGNOSIS — I10 Essential (primary) hypertension: Secondary | ICD-10-CM | POA: Diagnosis not present

## 2019-04-22 ENCOUNTER — Other Ambulatory Visit (INDEPENDENT_AMBULATORY_CARE_PROVIDER_SITE_OTHER): Payer: Self-pay | Admitting: Ophthalmology

## 2019-04-25 DIAGNOSIS — F332 Major depressive disorder, recurrent severe without psychotic features: Secondary | ICD-10-CM | POA: Diagnosis not present

## 2019-04-26 DIAGNOSIS — Z23 Encounter for immunization: Secondary | ICD-10-CM | POA: Diagnosis not present

## 2019-05-09 DIAGNOSIS — F332 Major depressive disorder, recurrent severe without psychotic features: Secondary | ICD-10-CM | POA: Diagnosis not present

## 2019-05-21 DIAGNOSIS — I10 Essential (primary) hypertension: Secondary | ICD-10-CM | POA: Diagnosis not present

## 2019-05-30 DIAGNOSIS — F332 Major depressive disorder, recurrent severe without psychotic features: Secondary | ICD-10-CM | POA: Diagnosis not present

## 2019-06-10 DIAGNOSIS — F332 Major depressive disorder, recurrent severe without psychotic features: Secondary | ICD-10-CM | POA: Diagnosis not present

## 2019-06-19 ENCOUNTER — Encounter: Payer: Self-pay | Admitting: Neurology

## 2019-06-19 ENCOUNTER — Other Ambulatory Visit (INDEPENDENT_AMBULATORY_CARE_PROVIDER_SITE_OTHER): Payer: Self-pay | Admitting: Ophthalmology

## 2019-06-19 ENCOUNTER — Other Ambulatory Visit: Payer: Self-pay

## 2019-06-19 ENCOUNTER — Ambulatory Visit (INDEPENDENT_AMBULATORY_CARE_PROVIDER_SITE_OTHER): Payer: Medicare Other | Admitting: Neurology

## 2019-06-19 VITALS — BP 162/94 | HR 80 | Temp 97.4°F

## 2019-06-19 DIAGNOSIS — G8114 Spastic hemiplegia affecting left nondominant side: Secondary | ICD-10-CM

## 2019-06-19 DIAGNOSIS — G43709 Chronic migraine without aura, not intractable, without status migrainosus: Secondary | ICD-10-CM

## 2019-06-19 DIAGNOSIS — G40909 Epilepsy, unspecified, not intractable, without status epilepticus: Secondary | ICD-10-CM

## 2019-06-19 DIAGNOSIS — IMO0002 Reserved for concepts with insufficient information to code with codable children: Secondary | ICD-10-CM

## 2019-06-19 DIAGNOSIS — Z79899 Other long term (current) drug therapy: Secondary | ICD-10-CM

## 2019-06-19 DIAGNOSIS — I63311 Cerebral infarction due to thrombosis of right middle cerebral artery: Secondary | ICD-10-CM

## 2019-06-19 MED ORDER — INCOBOTULINUMTOXINA 100 UNITS IM SOLR: 500.0000 [IU] | INTRAMUSCULAR | Status: DC

## 2019-06-19 MED ORDER — INCOBOTULINUMTOXINA 100 UNITS IM SOLR
500.0000 [IU] | INTRAMUSCULAR | Status: DC
  Administered 2019-06-19: 15:00:00 500 [IU] via INTRAMUSCULAR

## 2019-06-19 MED ORDER — ONABOTULINUMTOXINA 100 UNITS IJ SOLR: 100.0000 [IU] | Freq: Once | INTRAMUSCULAR | Status: DC

## 2019-06-19 MED ORDER — LAMOTRIGINE 100 MG PO TABS: ORAL_TABLET | ORAL | 4 refills | Status: DC

## 2019-06-19 MED ORDER — TRAZODONE HCL 50 MG PO TABS: 100.0000 mg | ORAL_TABLET | Freq: Every day | ORAL | 11 refills | Status: DC

## 2019-06-19 MED ORDER — INCOBOTULINUMTOXINA 100 UNITS IM SOLR: 400.0000 [IU] | INTRAMUSCULAR | Status: DC

## 2019-06-19 NOTE — Progress Notes (Addendum)
**  [name] 200 units x 2 vials, NDC 0259-1620-01, Lot [id number], Exp 03/2021, [name] 100 units x 1 vial, NDC 0259-1610-01, Lot [id number], Exp 01/2021, office supply.//mck,rn**

## 2019-06-19 NOTE — Progress Notes (Signed)
Chief Complaint  Patient presents with  . Left Spastic Hemiplegia    [name] 200 units x 2 vials, [name] 100 units x 1 vial      PATIENT: Charles [name] DOB: 02-10-1956  HISTORICAL  Charles [name] is a 64 years old right-handed male, accompanied by his mother, and his brother, interpreter, for EMG guided Botox injection for his spastic left hemiparesis,I saw him first in Nov 2015, he was previously patients of Dr. Leonie Man, and Dr. Janann Colonel, who has performed EMG guided Botox injection in January, April, July 2015, for his spastic left hemiparesis, which he responded very well  He was born deaf, He had a history of pulmonary sarcoidosis, was treated with long-term low-dose steroid, also with past medical history of hypertension, diabetes, hyperlipidemia,  He suffered stroke in April 2014, was treated at [place], with residual severe left hemiparesis, per record  MRI of the brain showed remote right hemispheric infarct and small vessel disease type changes  MRA of brain abrupt  cutoff of flow at the right carotid terminus with nonvisualization of right middle cerebral artery branch vessels consistent with the patient's remote right hemispheric infarct. Fetal type origin of the posterior cerebral arteries with posterior cerebral artery supplied predominately from the anterior circulation. 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   2D Echocardiogram 60%, wall motion normal, LA normal size Carotid Doppler Bilateral: 1-39% ICA stenosis. Vertebral artery flow is antegrade  TEE no PFO, Pulm AVM  EEG in August 2014, normal  awake and asleep EEG. No focal or generalized epileptiform discharges noted   UPDATE May 8th 2017: He is with his mother and brother at today's clinical visit, last visit was November 25th 2015 for EMG guided Botox injection to his spastic left upper and lower extremity, he denies significant improvement with Botox injection.  Today he came in  with a new issue, complains a year history of intermittent right elbow discomfort, radiating pain to right arm, right hand, mainly involving right fifths, and fourth fingers, it happened with prolonged sitting, eating, or sleeping, he also noticed mild right hand weakness, difficulty to unscrew the bottle top. 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 He complains progressive worsening gait abnormality, falling few times, neck pain, low back pain, radiating pain to his right leg  UPDATE August 24 2015: He is accompanied by his mother brother and interpreter at today's clinical visit, he continue complains of low back pain radiating pain to right lower extremity, intermittent right arm hands paresthesia, worsening gait difficulty, urinary urgency,  We have personally reviewed MRI of cervical spine in June 2017: There is severe spinal stenosis at C3-C4 and at C4-C5 due to central disc protrusions/herniations, uncovertebral spurring, facet hypertrophy and congenitally short pedicles. There is subtle hyperintense signal within the spinal cord on sagittal STIR images just below the C3-C4 interspace but this is not confirmed on axial images. This could represent a mild cervical myelopathy. 2. There is moderate spinal stenosis at C5-C6 and C6-C7 mild spinal stenosis at C2-C3 to degenerative changes and congenitally short pedicles. 3. At C3-C4 there is moderately severe bilateral foraminal narrowing at could lead to impingement either of the C4 nerve roots. There does not appear to  be nerve root impingement at the other cervical levels  MRI of lumbar spine in June 2017: At L4-L5, there is broad disc bulging, moderately severe  facet hypertrophy, right greater than left, and minimal anterolisthesis. There is no nerve root compression at this level. When compared to the MRI dated 12/24/2012, the synovial cyst that was noted to the left on the prior MRI is no longer present. This relieves the left L5 nerve root impingement that was noted at that time. However, since the prior MRI there has been mild progression of the facet hypertrophy and minimal anterolisthesis . 2. There are milder degenerative changes at L3-L4 and L5-S1 that did not lead to any nerve root impingement, unchanged when compared to the previous MRI.  UPDATE Dec 03 2015: He had cervical decompression laminectomy C3-4-5 6 with foraminotomies of the C4-5 VI nerve roots by Dr. Saintclair Halsted on October 07 2015, patient reported significant improvement in his neck pain, family noticed he has difficulty sleeping, agitated.  I personally reviewed MRI cervical spine in September 2017: Posterior hardware fusion C3 through C7. Posterior decompression C3 through C5. Small posterior fluid collection in the soft tissues may represent postop fluid versus CSF leak. Small area of cord hyperintensity at the C4 level is unchanged compatible with chronic myelomalacia. No cord compression.  He complains of low back pain,  MRI of left femur there was no significant abnormality notice, MRI lumbar in June 2017, multilevel degenerative changes, but there was no significant canal or foraminal stenosis   UPDATE Jan 16th 2018: He complains of neck pain, using heating pad, stiff, limited range of motion, lean back to get relief, difficulty to get a comfortable position to sleep, he lost drip of his right hand,  Left leg pain when bearing weight, left leg tightness,   CAT scan of the brain in August 2017 showed large right MCA stroke  Update Jul 05 2016: He complains of worsening left-sided low back pain, unsteady gait, we have personally reviewed MRI of lumbar in June 2017, only mild  degenerative changes, there is no significant canal or foraminal stenosis.  He is taking Mobic/naproxen as needed, worsening spastic left hemiparesis, left hands in position   UPDATE June 5th 2018: He returned for a electronic stimulation guided xeomin injection for spastic left hemiparesis, was emphasize on left upper extremity today,  UPDATE Feb 08 2017: He did respond some to previous xeomin injection 500 units, still has significant left arm spasticity, weakness, left anterior thigh muscle spasm in the achy pain  UPDATE Jun 27 2017: He presented to the emergency room on April 22, 2017, complains of the headaches, apparently he was given cocktail of Compazine, Benadryl, Decadron,  CT head without contrast showed chronic right MCA stroke, no acute abnormality  Laboratory evaluation showed normal TSH, free T4, lipid panel, A1c was mildly elevated 6.0, MRI of left hip on April 26, 2017 showed mild marginal osteophytes on the femoral head, stable since 2017,  He was given right greater occipital nerve perineural steroid injection on June 19, 2017 by Dr. Rolla Flatten, his headache overall has much improved,  UPDATE August 30 2017: He now lives at nursing home, there is no recurrent seizure, he is not taking lower dose of lamotrigine 100 mg daily, responding well to previous EMG guided xeomin injection.  UPDATE Nov 29 2017: He was not sure about the benefit from previous injection, no recurrent seizure, was noted to have left anterior shin area swelling,  UPDATE Jan 9th 2020: He  presented to emergency room on February 25, 2018 for increased aggressive behavior at nursing home noncompliant with his medications, instead of continual lamotrigine, he was given a new list of uncollected medications Keppra 500 mg twice daily, which was known to cause increased agitation for him in the past,  We went over his polypharmacy in detail, will change him back to lamotrigine, also check the lamotrigine level for  compliance, received 500 units of [name] injection for spastic left upper extremity today  He was noted to have worsening confusion, we personally reviewed CT scan in September 2019: No acute abnormality, large right MCA stroke encephalomalacia, extensive periventricular small vessel disease  UPDATE September 12 2018: He is doing well, has no recurrent seizure, but complains of frequent headaches, over-the-counter medicine does not help, lateralized mild to moderate headache, with light noise sensitivity, but made it worse, resting was helpful, lasting half day to whole day  he is discharged from nursing home to be with his brother because of his behavior issues, he was on Keppra and lamotrigine, Keppra caused emotional outburst, will stop the Keppra, change to lamotrigine 100 mg 2 tablets twice a day,  Laboratory evaluation in January 2020, lamotrigine was not detected, Keppra was 17.9, normal B12, CMP showed elevated glucose 165, calcium of 8.5, negative troponin  UPDATE Dec 03 2018: He responded well to previous injection, there was no recurrent seizure,  Update March 18, 2019: I reviewed his ophthalmology evaluation on February 12, 2019: Elevated inferotemporal mass OS was resolved heme, 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) the decision was to continue monitoring,  He now lives with his brother at home, no recurrent seizure, taking lamotrigine 100 mg 2 tablets twice a day, continue have occasional emotional outbursts, no longer taking Keppra,  UPDATE June 19 2019: He lives with his brother Charles [name], who reported that patient has increased memory loss, confusion, angry control issues, he misplace things, often get frustrated and angry towards people, he has sleep difficulty, despite trazodone 50 mg every night, he also complains of worsening depression,  He is on polypharmacy treatment, taking lamotrigine 200 mg twice a day, there was no  recurrent seizure, on multiple eyedrop frequent [facility] visit for his left eye issues, 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 Hydrocodone Other (See Comments)    Makes "looney"  . Shellfish Allergy Hives and Swelling  . Metformin Diarrhea  . Contrast Media [Iodinated Diagnostic Agents] Itching  . Oxycodone Nausea And Vomiting    HOME MEDICATIONS: Current Outpatient Medications on File Prior to Visit  Medication Sig Dispense Refill  . albuterol (PROAIR HFA) 108 (90 Base) MCG/ACT inhaler Inhale 2 puffs into the lungs every 4 (four) hours as needed for wheezing or shortness of breath.     [name] amLODipine (NORVASC) 5 MG tablet Take 5 mg by mouth daily.    [name] atorvastatin (LIPITOR) 10 MG tablet Take 10 mg by mouth daily. (cholesterol)    . atropine 1 % ophthalmic solution INSTILL 1 DROP IN LEFT EYE TWICE DAILY 5 mL 1  . bacitracin-polymyxin b (POLYSPORIN) ophthalmic ointment Place 1 application into the left eye 4 (four) times daily. apply to eye every 12 hours  while awake 3.5 g 6  . brimonidine (ALPHAGAN) 0.2 % ophthalmic solution Place 1 drop into the left eye 3 (three) times daily. 10 mL 3  . clopidogrel (PLAVIX) 75 MG tablet Take 75 mg by mouth daily.    . dorzolamide-timolol (COSOPT) 22.3-6.8 MG/ML ophthalmic solution INSTILL 1 DROP IN LEFT EYE THREE TIMES DAILY 10 mL 3  . escitalopram (LEXAPRO) 10 MG tablet Take 10 mg by mouth daily.    [name] gatifloxacin (ZYMAXID) 0.5 % SOLN Place 1 drop into the left eye 4 (four) times daily.    [name] ibuprofen (ADVIL) 600 MG tablet Take 600 mg by mouth every 6 (six) hours as needed.    . IncobotulinumtoxinA  ([name] IM) Inject 500 Units into the muscle every 3 (three) months.    . lamoTRIgine (LAMICTAL) 100 MG tablet Take two tablets twice daily. (Patient taking differently: Take 200 mg by mouth 2 (two) times daily. Take two tablets twice daily.) 360 tablet 4  . latanoprost (XALATAN) 0.005 % ophthalmic solution Place 1 drop into the left eye at bedtime. 2.5 mL 1  . losartan (COZAAR) 100 MG tablet Take 100 mg by mouth daily.    [name] omeprazole (PRILOSEC) 20 MG capsule Take 20 mg by mouth daily.    . prednisoLONE acetate (PRED FORTE) 1 % ophthalmic suspension Place 1 drop into the left eye 4 (four) times daily. 15 mL 0  . tamsulosin (FLOMAX) 0.4 MG CAPS capsule Take 0.4 mg by mouth daily.    . traZODone (DESYREL) 50 MG tablet Take 50 mg by mouth at bedtime.     No current facility-administered medications on file prior to visit.    PAST MEDICAL HISTORY: Past Medical History:  Diagnosis Date  . Anemia   . Anxiety   . Asthma   . Deaf   . Depression   . Essential hypertension   . GERD (gastroesophageal reflux disease)   . History of migraine   . History of stroke    Spastic hemiplegia, right MCA stroke April 2014 in [place]  . Mixed hyperlipidemia   . PAD (peripheral artery disease) ([place])   . Sarcoidosis   . Seizures ([place])    Last one over 2 yrs ago in 2018, controlled with med  . Shingles   . Type 2 diabetes mellitus ([place])     PAST SURGICAL HISTORY: Past Surgical History:  Procedure Laterality Date  . AIR/FLUID EXCHANGE Left 10/04/2018   Procedure: Air/Fluid Exchange;  Surgeon: Bernarda Caffey, MD;  Location: [place];  Service: Ophthalmology;  Laterality: Left;  . BRAVO PH STUDY N/A 06/18/2013   pH STUDY SHOWS NEXIUM TWICE DAILY CONTROLS THE ACID IN HIS STOMACH. HE HAD VERY FEWEPISODE OF REGURGITATION RECORDED IN THE 2 DAYS THE STUDY WAS PERFORMED.   [name] COLONOSCOPY  2011   IN [place]  . ESOPHAGOGASTRODUODENOSCOPY N/A 06/18/2013   Dr.Fields- probable proximal esophageal web,dilation  performed, bravo cap placed, mild non-erosive gastritis in the gastric antrum and on the greater curvature of the gastric body bx- granulomatous gastritis, duodenal mucosa showed no abnormalities in the bulb and second portion of the duodenum.   . ESOPHAGOGASTRODUODENOSCOPY N/A 05/05/2015   Procedure: ESOPHAGOGASTRODUODENOSCOPY (EGD);  Surgeon: Danie Binder, MD;  [facility];  Service: Endoscopy;  Laterality: N/A;  730  . INJECTION OF SILICONE OIL Left [id number]   Procedure: Injection Of Silicone Oil;  Surgeon: Bernarda Caffey, MD;  Location: [place];  Service: Ophthalmology;  Laterality: Left;  . LASER PHOTO ABLATION Left 10/04/2018  Procedure: Laser Photo Ablation;  Surgeon: Bernarda Caffey, MD;  Location: [place];  Service: Ophthalmology;  Laterality: Left;  [name] MALONEY DILATION N/A 06/18/2013   Procedure: MALONEY DILATION;  Surgeon: Danie Binder, MD;  [facility];  Service: Endoscopy;  Laterality: N/A;  . OTHER SURGICAL HISTORY     cyst removal head and chest  . PARS PLANA VITRECTOMY Left 10/04/2018   Procedure: PARS PLANA VITRECTOMY WITH 25 GAUGE;  Surgeon: Bernarda Caffey, MD;  Location: [place];  Service: Ophthalmology;  Laterality: Left;  . POSTERIOR CERVICAL FUSION/FORAMINOTOMY N/A 10/07/2015   Procedure: Cervical Three-Four, Cervical Four-Five, Cervical Five-Six, Cervical Six-Seven Posterior Cervical Laminectomy and Fusion;  Surgeon: Kary Kos, MD;  Location: [place] [facility];  Service: Neurosurgery;  Laterality: N/A;  . SAVORY DILATION N/A 06/18/2013   Procedure: SAVORY DILATION;  Surgeon: Danie Binder, MD;  [facility];  Service: Endoscopy;  Laterality: N/A;  . TEE WITHOUT CARDIOVERSION N/A 10/04/2012   Procedure: TRANSESOPHAGEAL ECHOCARDIOGRAM (TEE);  Surgeon: Thayer Headings, MD;  [facility] ENDOSCOPY;  Service: Cardiovascular;  Laterality: N/A;  . [place] accident  Both legs fractured and repaired surgically    FAMILY  HISTORY: Family History  Problem Relation Age of Onset  . Diabetes Mother   . Hypertension Mother   . Glaucoma Mother   . Retinal detachment Mother   . Hypertension Father   . Diabetes Brother   . Glaucoma Brother   . Colon polyps Neg Hx   . Colon cancer Neg Hx     SOCIAL HISTORY:  Social History   Socioeconomic History  . Marital status: Single    Spouse name: Not on file  . Number of children: 0  . Years of education: HS  . Highest education level: Not on file  Occupational History  . Occupation: Disabled  Tobacco Use  . Smoking status: Former Smoker    Packs/day: 1.00    Years: 29.00    Pack years: 29.00    Types: Cigarettes    Quit date: 02/22/1992    Years since quitting: 27.3  . Smokeless tobacco: Never Used  Substance and Sexual Activity  . Alcohol use: Not Currently    Comment: occasionally beer  . Drug use: Yes    Types: Marijuana, "Crack" cocaine    Comment: hx of crack cocaine "long time ago" per pt - hard to tell how long it is has been since he used,  . Sexual activity: Not on file  Other Topics Concern  . Not on file  Social History Narrative   Patient lives at home with his family. Brother, sister, mother    Patient does not work.    Patient has a high school education.    Patient has one step child    Social Determinants of Health   Financial Resource Strain:   . Difficulty of Paying Living Expenses:   Food Insecurity:   . Worried About Charity fundraiser in the Last Year:   . Arboriculturist in the Last Year:   Transportation Needs:   . Film/video editor (Medical):   [name] Lack of Transportation (Non-Medical):   Physical Activity:   . Days of Exercise per Week:   . Minutes of Exercise per Session:   Stress:   . Feeling of Stress :   Social Connections:   . Frequency of Communication with Friends and Family:   . Frequency of Social Gatherings with  Friends and Family:   . Attends Religious Services:   . Active Member of Clubs or  Organizations:   . Attends Archivist Meetings:   [name] Marital Status:   Intimate Partner Violence:   . Fear of Current or Ex-Partner:   . Emotionally Abused:   [name] Physically Abused:   . Sexually Abused:      PHYSICAL EXAM   Vitals:   06/19/19 1350  BP: (!) 162/94  Pulse: 80  Temp: (!) 97.4 F (36.3 C)    Not recorded      There is no height or weight on file to calculate BMI.   PHYSICAL EXAMNIATION:  PHYSICAL EXAMNIATION:  Gen: NAD, conversant, well nourised, well groomed                     Cardiovascular: Regular rate rhythm, no peripheral edema, warm, nontender. Eyes: Conjunctivae clear without exudates or hemorrhage Neck: Supple, no carotid bruits. Pulmonary: Clear to auscultation bilaterally   NEUROLOGICAL EXAM:  MENTAL STATUS: Speech/Cognition: Awake, alert, normal speech, oriented to history taking and casual conversation.  CRANIAL NERVES: CN II: Visual fields are full to confrontation.  Pupils are round equal and briskly reactive to light. CN III, IV, VI: extraocular movement are normal. No ptosis. CN V: Facial sensation is intact to light touch. CN VII: Face is symmetric with normal eye closure and smile. CN VIII: Hearing is normal to casual conversation CN IX, X: Palate elevates symmetrically. Phonation is normal. CN XI: Head turning and shoulder shrug are intact CN XII: Tongue is midline with normal movements and no atrophy.  MOTOR: Spastic left hemiparesis, no antigravity movement of left upper extremity, limited range of motion of left shoulder, significant spasticity of left pectoralis major, left latissimus dorsi, complains of pain with passive movement, tendency for left arm pronation, okay range of motion of left elbow, tends to hold left thumb in position, with passive movement, he has partial extension of fingers, tight left flexor forearm muscle tendons  He is able to raise left leg against gravity   REFLEXES: Hyperreflexia of  left upper and lower extremities,  SENSORY: Withdrawal to pain bilaterally  COORDINATION: There is no trunk or limb ataxia.    GAIT/STANCE: Deferred    Lab Results  Component Value Date   WBC 4.6 10/04/2018   HGB 13.6 10/04/2018   HCT 40.2 10/04/2018   MCV 90.3 10/04/2018   PLT 103 (L) 10/04/2018      Component Value Date/Time   NA 138 10/04/2018 1316   K 4.2 10/04/2018 1316   CL 102 10/04/2018 1316   CO2 24 10/04/2018 1316   GLUCOSE 106 (H) 10/04/2018 1316   BUN 12 10/04/2018 1316   CREATININE 1.02 10/04/2018 1316   CALCIUM 8.9 10/04/2018 1316   PROT 6.9 06/18/2018 1916   ALBUMIN 4.2 06/18/2018 1916   AST 23 06/18/2018 1916   ALT 18 06/18/2018 1916   ALKPHOS 75 06/18/2018 1916   BILITOT 0.7 06/18/2018 1916   GFRNONAA >60 10/04/2018 1316   GFRAA >60 10/04/2018 1316   Lab Results  Component Value Date   CHOL 160 09/13/2016   HDL 59 09/13/2016   LDLCALC 43 09/13/2016   TRIG 288 (H) 09/13/2016   CHOLHDL 2.7 09/13/2016   Lab Results  Component Value Date   HGBA1C 6.0 (H) 06/13/2017   Lab Results  Component Value Date   [id number] 03/01/2018   Lab Results  Component Value Date   TSH 1.83 06/13/2017  ASSESSMENT AND PLAN  Charles [name] is a 64 y.o. male  Congenital deaf, use sign language, history is through interpreter   History of cervical spondylitic myelopathy, presented with worsening gait abnormality  Status post cervical decompression surgery in August 2017.  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, is followed up by [place] ophthalmologist, on multiple eyedrops,  Worsening headaches,    History of stroke, with residual spastic left hemiparesis  Vascular risk factor of hypertension, hyperlipidemia, keep Plavix daily    Eelectrical stimulation guided xeomin injection, used 500 units (400 units to spastic left upper extremity, 100 unit injected according to migraine protocol)  Left pectoralis major 50 units Left Latissimus dorsi 50 units  Left brachialis 25 units  left pronator teres 25 units Left flexor digitorum profundi 50 units Left flexor digitorum superficialis 50 units  Left opponents 25  units Left palmaris longus 50 units Left flexor carpi ulnaris 50 units Left flexor carpi radialis 25 units   Botox  A injection for chronic migraine prevention, injection was performed according to [place] protocol,  5 units of Botox was injected into each side, for 20 injection sites, total of 100  Bilateral frontalis 4 injection sites Bilateral corrugate 2 injection sites Procerus 1 injection sites. Bilateral temporalis 8 injection sites Right parietal/ occipitalis 5 injection sites    Marcial Pacas, M.D. Ph.D.  [facility] [address], [place], [place] [address] [phone]

## 2019-06-21 ENCOUNTER — Other Ambulatory Visit (INDEPENDENT_AMBULATORY_CARE_PROVIDER_SITE_OTHER): Payer: Self-pay

## 2019-06-21 DIAGNOSIS — I10 Essential (primary) hypertension: Secondary | ICD-10-CM | POA: Diagnosis not present

## 2019-06-21 MED ORDER — ATROPINE SULFATE 1 % OP SOLN: 1.0000 [drp] | Freq: Two times a day (BID) | OPHTHALMIC | 1 refills | Status: DC

## 2019-06-25 ENCOUNTER — Encounter: Payer: Self-pay | Admitting: Pulmonary Disease

## 2019-06-25 ENCOUNTER — Ambulatory Visit: Payer: Medicare Other | Admitting: Podiatry

## 2019-06-25 DIAGNOSIS — R0902 Hypoxemia: Secondary | ICD-10-CM | POA: Diagnosis not present

## 2019-06-25 DIAGNOSIS — J449 Chronic obstructive pulmonary disease, unspecified: Secondary | ICD-10-CM | POA: Diagnosis not present

## 2019-07-02 DIAGNOSIS — H4052X0 Glaucoma secondary to other eye disorders, left eye, stage unspecified: Secondary | ICD-10-CM | POA: Diagnosis not present

## 2019-07-02 DIAGNOSIS — H3589 Other specified retinal disorders: Secondary | ICD-10-CM | POA: Diagnosis not present

## 2019-07-03 ENCOUNTER — Other Ambulatory Visit: Payer: Self-pay

## 2019-07-03 ENCOUNTER — Ambulatory Visit (HOSPITAL_COMMUNITY)
Admission: RE | Admit: 2019-07-03 | Discharge: 2019-07-03 | Disposition: A | Discharge status: Home / Self Care | Payer: Medicare Other | Source: Ambulatory Visit | Attending: Pulmonary Disease | Admitting: Pulmonary Disease

## 2019-07-03 ENCOUNTER — Encounter: Payer: Self-pay | Admitting: Pulmonary Disease

## 2019-07-03 ENCOUNTER — Other Ambulatory Visit (HOSPITAL_COMMUNITY)
Admission: RE | Admit: 2019-07-03 | Discharge: 2019-07-03 | Disposition: A | Discharge status: Home / Self Care | Payer: Medicare Other | Source: Ambulatory Visit | Attending: Pulmonary Disease | Admitting: Pulmonary Disease

## 2019-07-03 ENCOUNTER — Ambulatory Visit (INDEPENDENT_AMBULATORY_CARE_PROVIDER_SITE_OTHER): Payer: Medicare Other | Admitting: Pulmonary Disease

## 2019-07-03 VITALS — BP 122/70 | HR 79 | Temp 97.0°F | Ht 64.0 in | Wt 154.0 lb

## 2019-07-03 DIAGNOSIS — J432 Centrilobular emphysema: Secondary | ICD-10-CM

## 2019-07-03 DIAGNOSIS — D869 Sarcoidosis, unspecified: Secondary | ICD-10-CM

## 2019-07-03 DIAGNOSIS — I63311 Cerebral infarction due to thrombosis of right middle cerebral artery: Secondary | ICD-10-CM | POA: Diagnosis not present

## 2019-07-03 DIAGNOSIS — J449 Chronic obstructive pulmonary disease, unspecified: Secondary | ICD-10-CM | POA: Diagnosis not present

## 2019-07-03 DIAGNOSIS — J811 Chronic pulmonary edema: Secondary | ICD-10-CM | POA: Diagnosis not present

## 2019-07-03 DIAGNOSIS — F332 Major depressive disorder, recurrent severe without psychotic features: Secondary | ICD-10-CM | POA: Diagnosis not present

## 2019-07-03 LAB — CBC WITH DIFFERENTIAL/PLATELET
Abs Immature Granulocytes: 0.01 10*3/uL (ref 0.00–0.07)
Basophils Absolute: 0 10*3/uL (ref 0.0–0.1)
Basophils Relative: 0 %
Eosinophils Absolute: 0.1 10*3/uL (ref 0.0–0.5)
Eosinophils Relative: 2 %
HCT: 40.7 % (ref 39.0–52.0)
Hemoglobin: 13.6 g/dL (ref 13.0–17.0)
Immature Granulocytes: 0 %
Lymphocytes Relative: 27 %
Lymphs Abs: 1.3 10*3/uL (ref 0.7–4.0)
MCH: 30 pg (ref 26.0–34.0)
MCHC: 33.4 g/dL (ref 30.0–36.0)
MCV: 89.8 fL (ref 80.0–100.0)
Monocytes Absolute: 0.3 10*3/uL (ref 0.1–1.0)
Monocytes Relative: 7 %
Neutro Abs: 3 10*3/uL (ref 1.7–7.7)
Neutrophils Relative %: 64 %
Platelets: 114 10*3/uL — ABNORMAL LOW (ref 150–400)
RBC: 4.53 MIL/uL (ref 4.22–5.81)
RDW: 12.7 % (ref 11.5–15.5)
WBC: 4.7 10*3/uL (ref 4.0–10.5)
nRBC: 0 % (ref 0.0–0.2)

## 2019-07-03 LAB — COMPREHENSIVE METABOLIC PANEL
ALT: 31 U/L (ref 0–44)
AST: 27 U/L (ref 15–41)
Albumin: 4.2 g/dL (ref 3.5–5.0)
Alkaline Phosphatase: 79 U/L (ref 38–126)
Anion gap: 9 (ref 5–15)
BUN: 16 mg/dL (ref 8–23)
CO2: 27 mmol/L (ref 22–32)
Calcium: 8.8 mg/dL — ABNORMAL LOW (ref 8.9–10.3)
Chloride: 102 mmol/L (ref 98–111)
Creatinine, Ser: 1.15 mg/dL (ref 0.61–1.24)
GFR calc Af Amer: >60 mL/min (ref >60)
GFR calc non Af Amer: >60 mL/min (ref >60)
Glucose, Bld: 231 mg/dL — ABNORMAL HIGH (ref 70–99)
Potassium: 4 mmol/L (ref 3.5–5.1)
Sodium: 138 mmol/L (ref 135–145)
Total Bilirubin: 0.5 mg/dL (ref 0.3–1.2)
Total Protein: 7.2 g/dL (ref 6.5–8.1)

## 2019-07-03 MED ORDER — ANORO ELLIPTA 62.5-25 MCG/INH IN AEPB
1.0000 | INHALATION_SPRAY | Freq: Every day | RESPIRATORY_TRACT | 5 refills | Status: DC
Start: 2019-07-03 — End: 2019-08-01

## 2019-07-03 MED ORDER — ANORO ELLIPTA 62.5-25 MCG/INH IN AEPB
1.0000 | INHALATION_SPRAY | Freq: Every day | RESPIRATORY_TRACT | 0 refills | Status: DC
Start: 2019-07-03 — End: 2019-08-01

## 2019-07-03 NOTE — Progress Notes (Signed)
[facility]  Chief Complaint  Patient presents with  . Consult    Patient has shortness of breath with exertion like walking, standing up.     Constitutional:  BP 122/70 (BP Location: Right Arm, Patient Position: Sitting, Cuff Size: Normal)   Pulse 79   Temp (!) 97 F (36.1 C) (Temporal)   Ht 5\' 4"  (1.626 m)   Wt 154 lb (69.9 kg)   SpO2 96%   BMI 26.43 kg/m   Past Medical History:  Anemia, Anxiety, Deaf, Depression, HTN, GERD, Migraine HA, CVA, HLD, PAD, Sarcoidosis, Seizures, Shingles, DM  Summary:  Charles [name] is a 64 y.o. male former smoker with COPD with emphysema and hx of sarcoidosis.  Subjective:   He is here with sinus language interpretor and his brother.  He was previously seen by Dr. Melvyn Novas.    Quit smoking years ago.  Was previously on prednisone for sarcoidosis, but off for a long time.  He has been getting more short of breath with activity.  Also has cough with chest congestion and intermittent wheezing.  No fever, chest pain, or hemoptysis.  Has swelling in Lt leg since he had a stroke.  He is weak on Lt side and needs to use a cane.  His mobility is limited by Lt side weakness and shortness of breath.  He feels like his breathing is okay at night.  No recent lab tests, breathing test, or chest xray.  He maintained his SpO2 above 90% after walking one lap in the office.  He c/o fatigue and wasn't able to walk further.   Physical Exam:   Appearance - well kempt  ENMT - no sinus tenderness, no nasal discharge, no oral exudate, Mallampati MP 3  Respiratory - decreased breath sounds, prolonged exhalation, no wheeze, or rales  CV - regular rate and rhythm, no murmurs  GI - soft, non tender  Lymph - no adenopathy noted in neck  Ext - ankle edema Lt leg  Skin - no rashes  Neuro - weak on Lt side, uses a cane  Psych - normal mood and affect   Assessment/Plan:   COPD with emphysema. - will have him try  anoro - continue prn albuterol - check A1AT level, PFT, CXR, overnight oximetry  History of sarcoidosis. - check CXR, PFT, ACE level, CMET, CBC  A total of 50 minutes addressing patient care on the day of the visit.  Follow up:  Patient Instructions  Will schedule lab tests, chest xray, pulmonary function test and overnight oxygen test  Anoro one puff daily  Albuterol two puffs every 4 to 6 hours as needed for cough, wheeze, chest congestion, or shortness of breath  Follow up in 4 weeks   Signature:  Chesley Mires, MD [place] Pager: [phone] 07/03/2019, 12:54 PM  Flow Sheet     Pulmonary tests:  PFT 06/13/14 >> FEV1 1.47 (60%), FEV1% 53, TLC 4.61 (79%), DLCO 65%, +BD  Cardiac tests:  Echo 09/13/16 >> mild LVH, EF 60 to 65%, grade 1 DD  Medications:   Allergies as of 07/03/2019      Reactions   Hydrocodone Other (See Comments)   Makes "looney"   Shellfish Allergy Hives, Swelling   Metformin Diarrhea   Contrast Media [iodinated Diagnostic Agents] Itching   Oxycodone Nausea And Vomiting      Medication List       Accurate as of Jul 03, 2019 12:54 PM. If you have any  questions, ask your nurse or doctor.        amLODipine 5 MG tablet Commonly known as: NORVASC Take 5 mg by mouth daily.   Anoro Ellipta 62.5-25 MCG/INH Aepb Generic drug: umeclidinium-vilanterol Inhale 1 puff into the lungs daily. Started by: Chesley Mires, MD   Anoro Ellipta 62.5-25 MCG/INH Aepb Generic drug: umeclidinium-vilanterol Inhale 1 puff into the lungs daily. Started by: Chesley Mires, MD   atorvastatin 10 MG tablet Commonly known as: LIPITOR Take 10 mg by mouth daily. (cholesterol)   atropine 1 % ophthalmic solution INSTILL 1 DROP IN LEFT EYE TWICE DAILY   atropine 1 % ophthalmic solution Place 1 drop into the left eye 2 (two) times daily.   bacitracin-polymyxin b ophthalmic ointment Commonly known as: POLYSPORIN Place 1 application into the left  eye 4 (four) times daily. apply to eye every 12 hours while awake   brimonidine 0.2 % ophthalmic solution Commonly known as: ALPHAGAN Place 1 drop into the left eye 3 (three) times daily.   clopidogrel 75 MG tablet Commonly known as: PLAVIX Take 75 mg by mouth daily.   dorzolamide-timolol 22.3-6.8 MG/ML ophthalmic solution Commonly known as: COSOPT INSTILL 1 DROP IN LEFT EYE THREE TIMES DAILY   gatifloxacin 0.5 % Soln Commonly known as: ZYMAXID Place 1 drop into the left eye 4 (four) times daily.   ibuprofen 600 MG tablet Commonly known as: ADVIL Take 600 mg by mouth every 6 (six) hours as needed.   lamoTRIgine 100 MG tablet Commonly known as: LAMICTAL Take two tablets twice daily.   latanoprost 0.005 % ophthalmic solution Commonly known as: Xalatan Place 1 drop into the left eye at bedtime.   losartan 100 MG tablet Commonly known as: COZAAR Take 100 mg by mouth daily.   omeprazole 20 MG capsule Commonly known as: PRILOSEC Take 20 mg by mouth daily.   prednisoLONE acetate 1 % ophthalmic suspension Commonly known as: PRED FORTE Place 1 drop into the left eye 4 (four) times daily.   ProAir HFA 108 (90 Base) MCG/ACT inhaler Generic drug: albuterol Inhale 2 puffs into the lungs every 4 (four) hours as needed for wheezing or shortness of breath.   tamsulosin 0.4 MG Caps capsule Commonly known as: FLOMAX Take 0.4 mg by mouth daily.   traZODone 50 MG tablet Commonly known as: DESYREL Take 2 tablets (100 mg total) by mouth at bedtime.   [name] IM Inject 500 Units into the muscle every 3 (three) months.       Past Surgical History:  He  has a past surgical history that includes Tibia fracture surgery (Left, 1985); TEE without cardioversion (N/A, 10/04/2012); Colonoscopy (2011); Esophagogastroduodenoscopy (N/A, 06/18/2013); Savory dilation (N/A, 06/18/2013); maloney dilation (N/A, 06/18/2013); BRAVO ph study (N/A, 06/18/2013); Esophagogastroduodenoscopy (N/A, 05/05/2015);  OTHER SURGICAL HISTORY; Posterior cervical fusion/foraminotomy (N/A, 10/07/2015); Pars plana vitrectomy (Left, 10/04/2018); Injection of silicone oil (Left, [id number]); Laser photo ablation (Left, 10/04/2018); and Air/fluid exchange (Left, 10/04/2018).  Family History:  His family history includes Diabetes in his brother and mother; Glaucoma in his brother and mother; Hypertension in his father and mother; Retinal detachment in his mother.  Social History:  He  reports that he quit smoking about 27 years ago. His smoking use included cigarettes. He has a 29.00 pack-year smoking history. He has never used smokeless tobacco. He reports previous alcohol use. He reports current drug use. Drugs: Marijuana and "Crack" cocaine.

## 2019-07-03 NOTE — Patient Instructions (Signed)
Will schedule lab tests, chest xray, pulmonary function test and overnight oxygen test  Anoro one puff daily  Albuterol two puffs every 4 to 6 hours as needed for cough, wheeze, chest congestion, or shortness of breath  Follow up in 4 weeks

## 2019-07-04 LAB — ANGIOTENSIN CONVERTING ENZYME: Angiotensin-Converting Enzyme: 72 U/L (ref 14–82)

## 2019-07-08 LAB — ALPHA-1 ANTITRYPSIN PHENOTYPE: A-1 Antitrypsin, Ser: 116 mg/dL (ref 101–187)

## 2019-07-09 ENCOUNTER — Encounter: Payer: Self-pay | Admitting: Podiatry

## 2019-07-09 ENCOUNTER — Other Ambulatory Visit: Payer: Self-pay

## 2019-07-09 ENCOUNTER — Telehealth: Payer: Self-pay | Admitting: Pulmonary Disease

## 2019-07-09 ENCOUNTER — Ambulatory Visit (INDEPENDENT_AMBULATORY_CARE_PROVIDER_SITE_OTHER): Payer: Medicare Other | Admitting: Podiatry

## 2019-07-09 VITALS — Temp 97.1°F

## 2019-07-09 DIAGNOSIS — B351 Tinea unguium: Secondary | ICD-10-CM

## 2019-07-09 DIAGNOSIS — E119 Type 2 diabetes mellitus without complications: Secondary | ICD-10-CM | POA: Diagnosis not present

## 2019-07-09 DIAGNOSIS — M79676 Pain in unspecified toe(s): Secondary | ICD-10-CM | POA: Diagnosis not present

## 2019-07-09 DIAGNOSIS — D689 Coagulation defect, unspecified: Secondary | ICD-10-CM | POA: Diagnosis not present

## 2019-07-09 NOTE — Progress Notes (Signed)
This patient returns to my office for at risk foot care.  This patient requires this care by a professional since this patient will be at risk due to having coagulation defect and diabetes and CVA.  Patient presents with an interpreter.  He is concerned about his lack of motion left foot and swelling left foot.  This patient is unable to cut nails himself since the patient cannot reach his nails.These nails are painful walking and wearing shoes.  This patient presents for at risk foot care today.  General Appearance  Alert, conversant and in no acute stress.  Vascular  Dorsalis pedis and posterior tibial  pulses are palpable  bilaterally.  Capillary return is within normal limits  bilaterally. Temperature is within normal limits  bilaterally.  Neurologic  Senn-Weinstein monofilament wire test within normal limits  bilaterally. Muscle power within normal limits bilaterally.  Nails Thick disfigured discolored nails with subungual debris  from hallux to fifth toes bilaterally. No evidence of bacterial infection or drainage bilaterally.  Orthopedic  No limitations of motion  feet .  No crepitus or effusions noted.  No bony pathology or digital deformities noted. Lack of ROM left ankle and STJ.  Swelling left leg and foot.  Skin  normotropic skin with no porokeratosis noted bilaterally.  No signs of infections or ulcers noted.     Onychomycosis  Pain in right toes  Pain in left toes  Consent was obtained for treatment procedures.   Mechanical debridement of nails 1-5  bilaterally performed with a nail nipper.  Filed with dremel without incident. Discussed his swelling and lack of ROM with patient.   Return office visit   3 months                   Told patient to return for periodic foot care and evaluation due to potential at risk complications.   Gardiner Barefoot DPM

## 2019-07-09 NOTE — Telephone Encounter (Signed)
DG Chest 2 View  Result Date: 07/04/2019 CLINICAL DATA:  Dyspnea, COPD, sarcoidosis EXAM: CHEST - 2 VIEW COMPARISON:  06/18/2018 FINDINGS: Frontal and lateral views of the chest demonstrate a stable cardiac silhouette. Calcified mediastinal and hilar lymph nodes consistent with history of sarcoidosis. There is central vascular congestion with scattered interstitial opacities consistent with edema. No effusion or pneumothorax. IMPRESSION: 1. Mild interstitial edema superimposed upon background sarcoidosis. Electronically Signed   By: Randa Ngo M.D.   On: 07/04/2019 03:13   CMP Latest Ref Rng & Units 07/03/2019 10/04/2018 06/18/2018  Glucose 70 - 99 mg/dL 231(H) 106(H) 165(H)  BUN 8 - 23 mg/dL 16 12 11   Creatinine 0.61 - 1.24 mg/dL 1.15 1.02 1.06  Sodium 135 - 145 mmol/L 138 138 140  Potassium 3.5 - 5.1 mmol/L 4.0 4.2 3.7  Chloride 98 - 111 mmol/L 102 102 104  CO2 22 - 32 mmol/L 27 24 28   Calcium 8.9 - 10.3 mg/dL 8.8(L) 8.9 8.5(L)  Total Protein 6.5 - 8.1 g/dL 7.2 - 6.9  Total Bilirubin 0.3 - 1.2 mg/dL 0.5 - 0.7  Alkaline Phos 38 - 126 U/L 79 - 75  AST 15 - 41 U/L 27 - 23  ALT 0 - 44 U/L 31 - 18    CBC Latest Ref Rng & Units 07/03/2019 10/04/2018 06/18/2018  WBC 4.0 - 10.5 K/uL 4.7 4.6 3.7(L)  Hemoglobin 13.0 - 17.0 g/dL 13.6 13.6 13.2  Hematocrit 39.0 - 52.0 % 40.7 40.2 39.6  Platelets 150 - 400 K/uL 114(L) 103(L) 111(L)    ACE 07/03/19 >> 72 A1AT 07/03/19 >> 106, MM  Please let him know that his labs were normal.  His chest xray shows changes from sarcoidosis.  He needs to be scheduled for high resolution CT chest in [place] to further assess status of sarcoidosis.

## 2019-07-10 NOTE — Telephone Encounter (Signed)
Attempted to call patient's brother due to deafness of patient, brother is listed as contact. Unable to reach at this time, left message to call back. HRCT order not placed. Please place order once patient is reached.

## 2019-07-10 NOTE — Telephone Encounter (Signed)
Charles [name] brother is returning phone call. Charles [name] phone number is [phone].

## 2019-07-10 NOTE — Telephone Encounter (Signed)
Tried calling [name] back and he did not answer- LMTCB

## 2019-07-15 ENCOUNTER — Other Ambulatory Visit (HOSPITAL_COMMUNITY)
Admission: RE | Admit: 2019-07-15 | Discharge: 2019-07-15 | Disposition: A | Discharge status: Home / Self Care | Payer: Medicare Other | Source: Ambulatory Visit | Attending: Pulmonary Disease | Admitting: Pulmonary Disease

## 2019-07-15 ENCOUNTER — Other Ambulatory Visit: Payer: Self-pay

## 2019-07-15 DIAGNOSIS — Z20822 Contact with and (suspected) exposure to covid-19: Secondary | ICD-10-CM | POA: Insufficient documentation

## 2019-07-15 DIAGNOSIS — Z01812 Encounter for preprocedural laboratory examination: Secondary | ICD-10-CM | POA: Diagnosis not present

## 2019-07-16 LAB — SARS CORONAVIRUS 2 (TAT 6-24 HRS): SARS Coronavirus 2: NEGATIVE

## 2019-07-16 NOTE — Telephone Encounter (Signed)
Left message for Gary.

## 2019-07-17 ENCOUNTER — Ambulatory Visit (INDEPENDENT_AMBULATORY_CARE_PROVIDER_SITE_OTHER): Payer: Medicare Other | Admitting: Pulmonary Disease

## 2019-07-17 ENCOUNTER — Other Ambulatory Visit: Payer: Self-pay

## 2019-07-17 DIAGNOSIS — J432 Centrilobular emphysema: Secondary | ICD-10-CM | POA: Diagnosis not present

## 2019-07-17 DIAGNOSIS — D869 Sarcoidosis, unspecified: Secondary | ICD-10-CM

## 2019-07-17 LAB — PULMONARY FUNCTION TEST
FEF 25-75 Post: 0.63 L/sec
FEF 25-75 Pre: 0.48 L/sec
FEF2575-%Change-Post: 30 %
FEF2575-%Pred-Post: 29 %
FEF2575-%Pred-Pre: 22 %
FEV1-%Change-Post: 9 %
FEV1-%Pred-Post: 47 %
FEV1-%Pred-Pre: 42 %
FEV1-Post: 1.1 L
FEV1-Pre: 1 L
FEV1FVC-%Change-Post: 6 %
FEV1FVC-%Pred-Pre: 73 %
FEV6-%Change-Post: 4 %
FEV6-%Pred-Post: 61 %
FEV6-%Pred-Pre: 58 %
FEV6-Post: 1.78 L
FEV6-Pre: 1.7 L
FEV6FVC-%Change-Post: 1 %
FEV6FVC-%Pred-Post: 103 %
FEV6FVC-%Pred-Pre: 101 %
FVC-%Change-Post: 2 %
FVC-%Pred-Post: 58 %
FVC-%Pred-Pre: 57 %
FVC-Post: 1.81 L
FVC-Pre: 1.76 L
Post FEV1/FVC ratio: 61 %
Post FEV6/FVC ratio: 99 %
Pre FEV1/FVC ratio: 57 %
Pre FEV6/FVC Ratio: 97 %

## 2019-07-17 NOTE — Progress Notes (Signed)
Spirometry pre and post done today. 

## 2019-07-21 DIAGNOSIS — I10 Essential (primary) hypertension: Secondary | ICD-10-CM | POA: Diagnosis not present

## 2019-07-23 DIAGNOSIS — H2513 Age-related nuclear cataract, bilateral: Secondary | ICD-10-CM | POA: Diagnosis not present

## 2019-07-23 DIAGNOSIS — H4052X0 Glaucoma secondary to other eye disorders, left eye, stage unspecified: Secondary | ICD-10-CM | POA: Insufficient documentation

## 2019-07-23 DIAGNOSIS — H16402 Unspecified corneal neovascularization, left eye: Secondary | ICD-10-CM | POA: Diagnosis not present

## 2019-07-23 DIAGNOSIS — E119 Type 2 diabetes mellitus without complications: Secondary | ICD-10-CM | POA: Diagnosis not present

## 2019-07-23 DIAGNOSIS — Z83511 Family history of glaucoma: Secondary | ICD-10-CM | POA: Diagnosis not present

## 2019-07-23 DIAGNOSIS — H21542 Posterior synechiae (iris), left eye: Secondary | ICD-10-CM | POA: Diagnosis not present

## 2019-07-23 DIAGNOSIS — H4052X3 Glaucoma secondary to other eye disorders, left eye, severe stage: Secondary | ICD-10-CM | POA: Diagnosis not present

## 2019-07-25 ENCOUNTER — Other Ambulatory Visit (INDEPENDENT_AMBULATORY_CARE_PROVIDER_SITE_OTHER): Payer: Self-pay | Admitting: Ophthalmology

## 2019-07-26 ENCOUNTER — Telehealth: Payer: Self-pay | Admitting: Pulmonary Disease

## 2019-07-26 DIAGNOSIS — Z01812 Encounter for preprocedural laboratory examination: Secondary | ICD-10-CM | POA: Diagnosis not present

## 2019-07-26 DIAGNOSIS — Z20822 Contact with and (suspected) exposure to covid-19: Secondary | ICD-10-CM | POA: Diagnosis not present

## 2019-07-26 NOTE — Telephone Encounter (Signed)
ONO with RA 06/25/19 >> test time 5 hrs 6 min.  Baseline SpO2 93%, SpO2 low 77%.  Spent 11 min with SpO2 < 88%.   Please let him know that his oxygen level is low at night.  Please arrange for him to be set up with 1 liter oxygen while asleep.

## 2019-07-29 DIAGNOSIS — E119 Type 2 diabetes mellitus without complications: Secondary | ICD-10-CM | POA: Diagnosis not present

## 2019-07-29 DIAGNOSIS — H401123 Primary open-angle glaucoma, left eye, severe stage: Secondary | ICD-10-CM | POA: Diagnosis not present

## 2019-07-30 NOTE — Telephone Encounter (Signed)
Called and spoke with pt's brother Charles [name] letting him know the results of pt's ONO and stated to him that it does qualify pt to wear O2 at night while sleeping. Charles [name] stated he wanted to hold off on [place] ordering O2 for pt and wanted to discuss this with  VS at upcoming [place].  Nothing further needed.

## 2019-07-30 NOTE — Telephone Encounter (Signed)
Can you please f/u with pt or his brother.  Might need to mail a letter.

## 2019-07-30 NOTE — Telephone Encounter (Signed)
Pts brother [name] calling back regarding pts test results. Can be reached at [phone]

## 2019-07-30 NOTE — Telephone Encounter (Signed)
Pts brother [name] returning call. [phone]

## 2019-07-30 NOTE — Telephone Encounter (Signed)
Left message for patient's brother [name] to call back.

## 2019-07-30 NOTE — Telephone Encounter (Signed)
ATC pt's brother, no answer. Left message for him to call back.

## 2019-08-01 ENCOUNTER — Encounter: Payer: Self-pay | Admitting: Pulmonary Disease

## 2019-08-01 ENCOUNTER — Ambulatory Visit (INDEPENDENT_AMBULATORY_CARE_PROVIDER_SITE_OTHER): Payer: Medicare Other | Admitting: Pulmonary Disease

## 2019-08-01 ENCOUNTER — Other Ambulatory Visit: Payer: Self-pay

## 2019-08-01 VITALS — BP 136/82 | HR 67 | Temp 97.5°F

## 2019-08-01 DIAGNOSIS — J449 Chronic obstructive pulmonary disease, unspecified: Secondary | ICD-10-CM

## 2019-08-01 DIAGNOSIS — D869 Sarcoidosis, unspecified: Secondary | ICD-10-CM

## 2019-08-01 DIAGNOSIS — I63311 Cerebral infarction due to thrombosis of right middle cerebral artery: Secondary | ICD-10-CM | POA: Diagnosis not present

## 2019-08-01 MED ORDER — ALBUTEROL SULFATE HFA 108 (90 BASE) MCG/ACT IN AERS: 2.0000 | INHALATION_SPRAY | RESPIRATORY_TRACT | 5 refills | Status: DC | PRN

## 2019-08-01 MED ORDER — ANORO ELLIPTA 62.5-25 MCG/INH IN AEPB: 1.0000 | INHALATION_SPRAY | Freq: Every day | RESPIRATORY_TRACT | 5 refills | Status: DC

## 2019-08-01 NOTE — Patient Instructions (Signed)
Anoro 1 puff daily  Proair two puffs every 4 hours as needed if you have more cough, shortness of breath, chest congestion, or wheezing  Will schedule high resolution CT chest and overnight oximetry  Follow up in 8 weeks

## 2019-08-01 NOTE — Progress Notes (Signed)
[facility]  Chief Complaint  Patient presents with   Follow-up    Breathing is unchanged since the last visit but his family disagrees and states it seems worse. He came in with empty albuterol inhaler.     Constitutional:  BP 136/82 (BP Location: Left Arm, Cuff Size: Normal)    Pulse 67    Temp (!) 97.5 F (36.4 C) (Temporal)    SpO2 97%   Past Medical History:  Anemia, Anxiety, Deaf, Depression, HTN, GERD, Migraine HA, CVA, HLD, PAD, Sarcoidosis, Seizures, Shingles, DM  Summary:  Charles [name] is a 64 y.o. male former smoker with COPD with emphysema and hx of sarcoidosis.  Subjective:  He is here with his brother and sign language interpretor.  He had CXR that showed progression of sarcoid.  He had Spirometry that showed progression of COPD.  He wasn't able to complete lung volume testing or diffusion capacity.  He had overnight oximetry that showed low oxygen, but he wanted to wait until appointment to discuss.  His breathing was better when on anoro.  He didn't realize he needed to refill this.  His albuterol is empty.  Physical Exam:    Appearance - well kempt   ENMT - no sinus tenderness, no oral exudate, no LAN, Mallampati 3 airway, no stridor  Respiratory - decreased breath sounds bilaterally, no wheezing or rales  CV - s1s2 regular rate and rhythm, no murmurs  Ext - no clubbing, no edema  Skin - no rashes  Psych - normal mood and affect   Assessment/Plan:   COPD with emphysema. - has progression - discussed roles of his various inhalers - will refill anoro and prn proair  History of sarcoidosis. - chest xray shows progression - will arrange for HRCT chest to further assess  Chronic respiratory failure with hypoxia. - his ONO showed low oxygen at night - unfortunately he didn't want to get O2 set up sooner - he will need to repeat ONO with room air to determine if he still qualifies for nocturnal oxygen  A  total of  32 minutes spent addressing patient care issues on day of visit.   Follow up:  Patient Instructions  Anoro 1 puff daily  Proair two puffs every 4 hours as needed if you have more cough, shortness of breath, chest congestion, or wheezing  Will schedule high resolution CT chest and overnight oximetry  Follow up in 8 weeks   Signature:  Chesley Mires, MD [place] Pager: [phone] 08/01/2019, 10:18 AM  Flow Sheet     Pulmonary tests:   PFT 06/13/14 >> FEV1 1.47 (60%), FEV1% 53, TLC 4.61 (79%), DLCO 65%, +BD  ACE 07/03/19 >> 72  A1AT 07/03/19 >> 106, MM  Spirometry 07/17/19 >> FEV1 1.10 (47%), FEV1% 61%  Chest imaging:    Sleep testing:   ONO with RA 06/25/19 >> test time 5 hrs 6 min.  Baseline SpO2 93%, SpO2 low 77%.  Spent 11 min with SpO2 < 88%.  Cardiac tests:   Echo 09/13/16 >> mild LVH, EF 60 to 65%, grade 1 DD  Medications:   Allergies as of 08/01/2019      Reactions   Hydrocodone Other (See Comments)   Makes "looney"   Shellfish Allergy Hives, Swelling   Metformin Diarrhea   Contrast Media [iodinated Diagnostic Agents] Itching   Oxycodone Nausea And Vomiting      Medication List       Accurate as of  August 01, 2019 10:18 AM. If you have any questions, ask your nurse or doctor.        albuterol 108 (90 Base) MCG/ACT inhaler Commonly known as: ProAir HFA Inhale 2 puffs into the lungs every 4 (four) hours as needed for wheezing or shortness of breath.   amLODipine 5 MG tablet Commonly known as: NORVASC Take 5 mg by mouth daily.   Anoro Ellipta 62.5-25 MCG/INH Aepb Generic drug: umeclidinium-vilanterol Inhale 1 puff into the lungs daily. What changed: Another medication with the same name was removed. Continue taking this medication, and follow the directions you see here. Changed by: Chesley Mires, MD   atorvastatin 10 MG tablet Commonly known as: LIPITOR Take 10 mg by mouth daily. (cholesterol)   atropine 1 %  ophthalmic solution INSTILL 1 DROP IN LEFT EYE TWICE DAILY What changed: Another medication with the same name was removed. Continue taking this medication, and follow the directions you see here. Changed by: Chesley Mires, MD   bacitracin-polymyxin b ophthalmic ointment Commonly known as: POLYSPORIN Place 1 application into the left eye 4 (four) times daily. apply to eye every 12 hours while awake   brimonidine 0.2 % ophthalmic solution Commonly known as: ALPHAGAN Place 1 drop into the left eye 3 (three) times daily.   clopidogrel 75 MG tablet Commonly known as: PLAVIX Take 75 mg by mouth daily.   dorzolamide-timolol 22.3-6.8 MG/ML ophthalmic solution Commonly known as: COSOPT INSTILL 1 DROP IN LEFT EYE THREE TIMES DAILY   gatifloxacin 0.5 % Soln Commonly known as: ZYMAXID Place 1 drop into the left eye 4 (four) times daily.   ibuprofen 600 MG tablet Commonly known as: ADVIL Take 600 mg by mouth every 6 (six) hours as needed.   lamoTRIgine 100 MG tablet Commonly known as: LAMICTAL Take two tablets twice daily.   latanoprost 0.005 % ophthalmic solution Commonly known as: Xalatan Place 1 drop into the left eye at bedtime.   losartan 100 MG tablet Commonly known as: COZAAR Take 100 mg by mouth daily.   omeprazole 20 MG capsule Commonly known as: PRILOSEC Take 20 mg by mouth daily.   prednisoLONE acetate 1 % ophthalmic suspension Commonly known as: PRED FORTE Place 1 drop into the left eye 4 (four) times daily.   tamsulosin 0.4 MG Caps capsule Commonly known as: FLOMAX Take 0.4 mg by mouth daily.   traZODone 50 MG tablet Commonly known as: DESYREL Take 2 tablets (100 mg total) by mouth at bedtime.   [name] IM Inject 500 Units into the muscle every 3 (three) months.       Past Surgical History:  He  has a past surgical history that includes Tibia fracture surgery (Left, 1985); TEE without cardioversion (N/A, 10/04/2012); Colonoscopy (2011);  Esophagogastroduodenoscopy (N/A, 06/18/2013); Savory dilation (N/A, 06/18/2013); maloney dilation (N/A, 06/18/2013); BRAVO ph study (N/A, 06/18/2013); Esophagogastroduodenoscopy (N/A, 05/05/2015); OTHER SURGICAL HISTORY; Posterior cervical fusion/foraminotomy (N/A, 10/07/2015); Pars plana vitrectomy (Left, 10/04/2018); Injection of silicone oil (Left, 0/93/2671); Laser photo ablation (Left, 10/04/2018); and Air/fluid exchange (Left, 10/04/2018).  Family History:  His family history includes Diabetes in his brother and mother; Glaucoma in his brother and mother; Hypertension in his father and mother; Retinal detachment in his mother.  Social History:  He  reports that he quit smoking about 27 years ago. His smoking use included cigarettes. He has a 29.00 pack-year smoking history. He has never used smokeless tobacco. He reports previous alcohol use. He reports current drug use. Drugs: Marijuana and "Crack" cocaine.

## 2019-08-06 DIAGNOSIS — R0902 Hypoxemia: Secondary | ICD-10-CM | POA: Diagnosis not present

## 2019-08-06 DIAGNOSIS — J449 Chronic obstructive pulmonary disease, unspecified: Secondary | ICD-10-CM | POA: Diagnosis not present

## 2019-08-08 ENCOUNTER — Ambulatory Visit: Payer: Medicare Other
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
## 2019-08-09 NOTE — Telephone Encounter (Signed)
Patient was seen in office on 08/01/19.

## 2019-08-12 ENCOUNTER — Other Ambulatory Visit: Payer: Self-pay

## 2019-08-12 ENCOUNTER — Encounter (INDEPENDENT_AMBULATORY_CARE_PROVIDER_SITE_OTHER): Payer: Self-pay | Admitting: Ophthalmology

## 2019-08-12 ENCOUNTER — Ambulatory Visit (INDEPENDENT_AMBULATORY_CARE_PROVIDER_SITE_OTHER): Payer: Medicare Other | Admitting: Ophthalmology

## 2019-08-12 DIAGNOSIS — H3322 Serous retinal detachment, left eye: Secondary | ICD-10-CM

## 2019-08-12 DIAGNOSIS — H913 Deaf nonspeaking, not elsewhere classified: Secondary | ICD-10-CM

## 2019-08-12 DIAGNOSIS — H4051X3 Glaucoma secondary to other eye disorders, right eye, severe stage: Secondary | ICD-10-CM | POA: Diagnosis not present

## 2019-08-12 DIAGNOSIS — H3581 Retinal edema: Secondary | ICD-10-CM

## 2019-08-12 DIAGNOSIS — H4312 Vitreous hemorrhage, left eye: Secondary | ICD-10-CM | POA: Diagnosis not present

## 2019-08-12 DIAGNOSIS — H25813 Combined forms of age-related cataract, bilateral: Secondary | ICD-10-CM

## 2019-08-12 DIAGNOSIS — H3589 Other specified retinal disorders: Secondary | ICD-10-CM

## 2019-08-12 DIAGNOSIS — E119 Type 2 diabetes mellitus without complications: Secondary | ICD-10-CM

## 2019-08-13 ENCOUNTER — Other Ambulatory Visit (INDEPENDENT_AMBULATORY_CARE_PROVIDER_SITE_OTHER): Payer: Self-pay | Admitting: Ophthalmology

## 2019-08-17 ENCOUNTER — Other Ambulatory Visit (INDEPENDENT_AMBULATORY_CARE_PROVIDER_SITE_OTHER): Payer: Self-pay | Admitting: Ophthalmology

## 2019-08-21 ENCOUNTER — Ambulatory Visit (HOSPITAL_COMMUNITY)
Admission: RE | Admit: 2019-08-21 | Discharge: 2019-08-21 | Disposition: A | Discharge status: Home / Self Care | Payer: Medicare Other | Source: Ambulatory Visit | Attending: Pulmonary Disease | Admitting: Pulmonary Disease

## 2019-08-21 ENCOUNTER — Other Ambulatory Visit: Payer: Self-pay

## 2019-08-21 DIAGNOSIS — I10 Essential (primary) hypertension: Secondary | ICD-10-CM | POA: Diagnosis not present

## 2019-08-21 DIAGNOSIS — D869 Sarcoidosis, unspecified: Secondary | ICD-10-CM | POA: Diagnosis present

## 2019-08-30 ENCOUNTER — Other Ambulatory Visit (INDEPENDENT_AMBULATORY_CARE_PROVIDER_SITE_OTHER): Payer: Self-pay | Admitting: Ophthalmology

## 2019-09-11 ENCOUNTER — Other Ambulatory Visit: Payer: Self-pay | Admitting: "Endocrinology

## 2019-09-11 ENCOUNTER — Other Ambulatory Visit: Payer: Self-pay

## 2019-09-11 DIAGNOSIS — R5383 Other fatigue: Secondary | ICD-10-CM

## 2019-09-11 DIAGNOSIS — E1151 Type 2 diabetes mellitus with diabetic peripheral angiopathy without gangrene: Secondary | ICD-10-CM

## 2019-09-12 DIAGNOSIS — E1151 Type 2 diabetes mellitus with diabetic peripheral angiopathy without gangrene: Secondary | ICD-10-CM | POA: Diagnosis not present

## 2019-09-12 DIAGNOSIS — R5383 Other fatigue: Secondary | ICD-10-CM | POA: Diagnosis not present

## 2019-09-13 DIAGNOSIS — F332 Major depressive disorder, recurrent severe without psychotic features: Secondary | ICD-10-CM | POA: Diagnosis not present

## 2019-09-13 LAB — COMPREHENSIVE METABOLIC PANEL
ALT: 29 IU/L (ref 0–44)
AST: 30 IU/L (ref 0–40)
Albumin/Globulin Ratio: 1.8 (ref 1.2–2.2)
Albumin: 4.8 g/dL (ref 3.8–4.8)
Alkaline Phosphatase: 91 IU/L (ref 48–121)
BUN/Creatinine Ratio: 11 (ref 10–24)
BUN: 13 mg/dL (ref 8–27)
Bilirubin Total: 0.6 mg/dL (ref 0.0–1.2)
CO2: 28 mmol/L (ref 20–29)
Calcium: 9.4 mg/dL (ref 8.6–10.2)
Chloride: 98 mmol/L (ref 96–106)
Creatinine, Ser: 1.19 mg/dL (ref 0.76–1.27)
GFR calc Af Amer: 74 mL/min/{1.73_m2} (ref >59)
GFR calc non Af Amer: 64 mL/min/{1.73_m2} (ref >59)
Globulin, Total: 2.7 g/dL (ref 1.5–4.5)
Glucose: 174 mg/dL — ABNORMAL HIGH (ref 65–99)
Potassium: 4.1 mmol/L (ref 3.5–5.2)
Sodium: 138 mmol/L (ref 134–144)
Total Protein: 7.5 g/dL (ref 6.0–8.5)

## 2019-09-13 LAB — MICROALBUMIN / CREATININE URINE RATIO
Creatinine, Urine: 50.9 mg/dL
Microalb/Creat Ratio: 85 mg/g creat — ABNORMAL HIGH (ref 0–29)
Microalbumin, Urine: 43.2 ug/mL

## 2019-09-13 LAB — T4, FREE: Free T4: 1.56 ng/dL (ref 0.82–1.77)

## 2019-09-13 LAB — HEMOGLOBIN A1C
Est. average glucose Bld gHb Est-mCnc: 157 mg/dL
Hgb A1c MFr Bld: 7.1 % — ABNORMAL HIGH (ref 4.8–5.6)

## 2019-09-13 LAB — TSH: TSH: 3.49 u[IU]/mL (ref 0.450–4.500)

## 2019-09-18 ENCOUNTER — Ambulatory Visit: Payer: Medicare Other | Admitting: Neurology

## 2019-09-18 ENCOUNTER — Ambulatory Visit: Payer: Medicare Other | Admitting: "Endocrinology

## 2019-09-19 ENCOUNTER — Other Ambulatory Visit (INDEPENDENT_AMBULATORY_CARE_PROVIDER_SITE_OTHER): Payer: Self-pay | Admitting: Ophthalmology

## 2019-09-20 DIAGNOSIS — I10 Essential (primary) hypertension: Secondary | ICD-10-CM | POA: Diagnosis not present

## 2019-10-02 DIAGNOSIS — Z20822 Contact with and (suspected) exposure to covid-19: Secondary | ICD-10-CM | POA: Diagnosis not present

## 2019-10-02 DIAGNOSIS — Z8673 Personal history of transient ischemic attack (TIA), and cerebral infarction without residual deficits: Secondary | ICD-10-CM | POA: Diagnosis not present

## 2019-10-02 DIAGNOSIS — R0602 Shortness of breath: Secondary | ICD-10-CM | POA: Diagnosis not present

## 2019-10-02 DIAGNOSIS — F329 Major depressive disorder, single episode, unspecified: Secondary | ICD-10-CM | POA: Diagnosis not present

## 2019-10-02 DIAGNOSIS — R45851 Suicidal ideations: Secondary | ICD-10-CM | POA: Diagnosis not present

## 2019-10-02 DIAGNOSIS — F332 Major depressive disorder, recurrent severe without psychotic features: Secondary | ICD-10-CM | POA: Diagnosis not present

## 2019-10-03 DIAGNOSIS — R0602 Shortness of breath: Secondary | ICD-10-CM | POA: Diagnosis not present

## 2019-10-04 DIAGNOSIS — I152 Hypertension secondary to endocrine disorders: Secondary | ICD-10-CM | POA: Diagnosis not present

## 2019-10-04 DIAGNOSIS — N400 Enlarged prostate without lower urinary tract symptoms: Secondary | ICD-10-CM | POA: Diagnosis present

## 2019-10-04 DIAGNOSIS — R2243 Localized swelling, mass and lump, lower limb, bilateral: Secondary | ICD-10-CM | POA: Diagnosis not present

## 2019-10-04 DIAGNOSIS — Z7984 Long term (current) use of oral hypoglycemic drugs: Secondary | ICD-10-CM | POA: Diagnosis not present

## 2019-10-04 DIAGNOSIS — E1165 Type 2 diabetes mellitus with hyperglycemia: Secondary | ICD-10-CM | POA: Diagnosis present

## 2019-10-04 DIAGNOSIS — Z87891 Personal history of nicotine dependence: Secondary | ICD-10-CM | POA: Diagnosis not present

## 2019-10-04 DIAGNOSIS — G40909 Epilepsy, unspecified, not intractable, without status epilepticus: Secondary | ICD-10-CM | POA: Diagnosis present

## 2019-10-04 DIAGNOSIS — D869 Sarcoidosis, unspecified: Secondary | ICD-10-CM | POA: Diagnosis present

## 2019-10-04 DIAGNOSIS — E1151 Type 2 diabetes mellitus with diabetic peripheral angiopathy without gangrene: Secondary | ICD-10-CM | POA: Diagnosis present

## 2019-10-04 DIAGNOSIS — E1159 Type 2 diabetes mellitus with other circulatory complications: Secondary | ICD-10-CM | POA: Diagnosis not present

## 2019-10-04 DIAGNOSIS — F339 Major depressive disorder, recurrent, unspecified: Secondary | ICD-10-CM | POA: Diagnosis present

## 2019-10-04 DIAGNOSIS — I517 Cardiomegaly: Secondary | ICD-10-CM | POA: Diagnosis not present

## 2019-10-04 DIAGNOSIS — Z8673 Personal history of transient ischemic attack (TIA), and cerebral infarction without residual deficits: Secondary | ICD-10-CM | POA: Diagnosis not present

## 2019-10-04 DIAGNOSIS — F332 Major depressive disorder, recurrent severe without psychotic features: Secondary | ICD-10-CM | POA: Diagnosis not present

## 2019-10-04 DIAGNOSIS — H409 Unspecified glaucoma: Secondary | ICD-10-CM | POA: Diagnosis present

## 2019-10-04 DIAGNOSIS — Z885 Allergy status to narcotic agent status: Secondary | ICD-10-CM | POA: Diagnosis not present

## 2019-10-04 DIAGNOSIS — F0390 Unspecified dementia without behavioral disturbance: Secondary | ICD-10-CM | POA: Diagnosis not present

## 2019-10-04 DIAGNOSIS — Z7902 Long term (current) use of antithrombotics/antiplatelets: Secondary | ICD-10-CM | POA: Diagnosis not present

## 2019-10-04 DIAGNOSIS — R918 Other nonspecific abnormal finding of lung field: Secondary | ICD-10-CM | POA: Diagnosis not present

## 2019-10-04 DIAGNOSIS — Z20822 Contact with and (suspected) exposure to covid-19: Secondary | ICD-10-CM | POA: Diagnosis present

## 2019-10-04 DIAGNOSIS — K219 Gastro-esophageal reflux disease without esophagitis: Secondary | ICD-10-CM | POA: Diagnosis present

## 2019-10-04 DIAGNOSIS — I071 Rheumatic tricuspid insufficiency: Secondary | ICD-10-CM | POA: Diagnosis not present

## 2019-10-04 DIAGNOSIS — I739 Peripheral vascular disease, unspecified: Secondary | ICD-10-CM | POA: Diagnosis not present

## 2019-10-04 DIAGNOSIS — J449 Chronic obstructive pulmonary disease, unspecified: Secondary | ICD-10-CM | POA: Diagnosis present

## 2019-10-04 DIAGNOSIS — Z91013 Allergy to seafood: Secondary | ICD-10-CM | POA: Diagnosis not present

## 2019-10-04 DIAGNOSIS — Z862 Personal history of diseases of the blood and blood-forming organs and certain disorders involving the immune mechanism: Secondary | ICD-10-CM | POA: Diagnosis not present

## 2019-10-04 DIAGNOSIS — I10 Essential (primary) hypertension: Secondary | ICD-10-CM | POA: Diagnosis present

## 2019-10-04 DIAGNOSIS — E785 Hyperlipidemia, unspecified: Secondary | ICD-10-CM | POA: Diagnosis present

## 2019-10-04 DIAGNOSIS — R531 Weakness: Secondary | ICD-10-CM | POA: Diagnosis not present

## 2019-10-04 DIAGNOSIS — Z79899 Other long term (current) drug therapy: Secondary | ICD-10-CM | POA: Diagnosis not present

## 2019-10-04 DIAGNOSIS — R600 Localized edema: Secondary | ICD-10-CM | POA: Diagnosis not present

## 2019-10-05 DIAGNOSIS — E1159 Type 2 diabetes mellitus with other circulatory complications: Secondary | ICD-10-CM | POA: Insufficient documentation

## 2019-10-05 DIAGNOSIS — H409 Unspecified glaucoma: Secondary | ICD-10-CM | POA: Insufficient documentation

## 2019-10-05 DIAGNOSIS — Z862 Personal history of diseases of the blood and blood-forming organs and certain disorders involving the immune mechanism: Secondary | ICD-10-CM | POA: Insufficient documentation

## 2019-10-05 DIAGNOSIS — Z8673 Personal history of transient ischemic attack (TIA), and cerebral infarction without residual deficits: Secondary | ICD-10-CM | POA: Insufficient documentation

## 2019-10-05 DIAGNOSIS — J45909 Unspecified asthma, uncomplicated: Secondary | ICD-10-CM | POA: Insufficient documentation

## 2019-10-05 DIAGNOSIS — K219 Gastro-esophageal reflux disease without esophagitis: Secondary | ICD-10-CM | POA: Insufficient documentation

## 2019-10-05 DIAGNOSIS — N400 Enlarged prostate without lower urinary tract symptoms: Secondary | ICD-10-CM | POA: Insufficient documentation

## 2019-10-07 ENCOUNTER — Ambulatory Visit: Payer: Medicare Other | Admitting: Pulmonary Disease

## 2019-10-17 DIAGNOSIS — R600 Localized edema: Secondary | ICD-10-CM | POA: Insufficient documentation

## 2019-10-19 DIAGNOSIS — Z20822 Contact with and (suspected) exposure to covid-19: Secondary | ICD-10-CM | POA: Insufficient documentation

## 2019-10-21 DIAGNOSIS — I10 Essential (primary) hypertension: Secondary | ICD-10-CM | POA: Diagnosis not present

## 2019-10-23 DIAGNOSIS — Z20822 Contact with and (suspected) exposure to covid-19: Secondary | ICD-10-CM | POA: Insufficient documentation

## 2019-11-07 DIAGNOSIS — F332 Major depressive disorder, recurrent severe without psychotic features: Secondary | ICD-10-CM | POA: Diagnosis not present

## 2019-11-21 DIAGNOSIS — I10 Essential (primary) hypertension: Secondary | ICD-10-CM | POA: Diagnosis not present

## 2019-11-28 DIAGNOSIS — E785 Hyperlipidemia, unspecified: Secondary | ICD-10-CM | POA: Diagnosis not present

## 2019-11-28 DIAGNOSIS — Z885 Allergy status to narcotic agent status: Secondary | ICD-10-CM | POA: Diagnosis not present

## 2019-11-28 DIAGNOSIS — D869 Sarcoidosis, unspecified: Secondary | ICD-10-CM | POA: Diagnosis not present

## 2019-11-28 DIAGNOSIS — Z20822 Contact with and (suspected) exposure to covid-19: Secondary | ICD-10-CM | POA: Diagnosis not present

## 2019-11-28 DIAGNOSIS — R0600 Dyspnea, unspecified: Secondary | ICD-10-CM | POA: Diagnosis not present

## 2019-11-28 DIAGNOSIS — R059 Cough, unspecified: Secondary | ICD-10-CM | POA: Diagnosis not present

## 2019-11-28 DIAGNOSIS — R9431 Abnormal electrocardiogram [ECG] [EKG]: Secondary | ICD-10-CM | POA: Diagnosis not present

## 2019-11-28 DIAGNOSIS — J441 Chronic obstructive pulmonary disease with (acute) exacerbation: Secondary | ICD-10-CM | POA: Diagnosis not present

## 2019-11-28 DIAGNOSIS — R069 Unspecified abnormalities of breathing: Secondary | ICD-10-CM | POA: Diagnosis not present

## 2019-11-28 DIAGNOSIS — R0602 Shortness of breath: Secondary | ICD-10-CM | POA: Diagnosis not present

## 2019-11-28 DIAGNOSIS — Z91041 Radiographic dye allergy status: Secondary | ICD-10-CM | POA: Diagnosis not present

## 2019-11-28 DIAGNOSIS — R062 Wheezing: Secondary | ICD-10-CM | POA: Diagnosis not present

## 2019-11-28 DIAGNOSIS — I10 Essential (primary) hypertension: Secondary | ICD-10-CM | POA: Diagnosis not present

## 2019-11-28 DIAGNOSIS — Z8673 Personal history of transient ischemic attack (TIA), and cerebral infarction without residual deficits: Secondary | ICD-10-CM | POA: Diagnosis not present

## 2019-11-28 DIAGNOSIS — J90 Pleural effusion, not elsewhere classified: Secondary | ICD-10-CM | POA: Diagnosis not present

## 2019-11-29 DIAGNOSIS — J42 Unspecified chronic bronchitis: Secondary | ICD-10-CM | POA: Diagnosis not present

## 2019-11-29 DIAGNOSIS — M1990 Unspecified osteoarthritis, unspecified site: Secondary | ICD-10-CM | POA: Diagnosis not present

## 2019-11-29 DIAGNOSIS — I679 Cerebrovascular disease, unspecified: Secondary | ICD-10-CM | POA: Diagnosis not present

## 2019-11-29 DIAGNOSIS — Z299 Encounter for prophylactic measures, unspecified: Secondary | ICD-10-CM | POA: Diagnosis not present

## 2019-11-29 DIAGNOSIS — G8110 Spastic hemiplegia affecting unspecified side: Secondary | ICD-10-CM | POA: Diagnosis not present

## 2019-11-29 DIAGNOSIS — Z6830 Body mass index (BMI) 30.0-30.9, adult: Secondary | ICD-10-CM | POA: Diagnosis not present

## 2019-12-19 DIAGNOSIS — F332 Major depressive disorder, recurrent severe without psychotic features: Secondary | ICD-10-CM | POA: Diagnosis not present

## 2019-12-21 DIAGNOSIS — I10 Essential (primary) hypertension: Secondary | ICD-10-CM | POA: Diagnosis not present

## 2019-12-27 ENCOUNTER — Ambulatory Visit (INDEPENDENT_AMBULATORY_CARE_PROVIDER_SITE_OTHER): Payer: Medicare Other | Admitting: Podiatry

## 2019-12-27 ENCOUNTER — Other Ambulatory Visit: Payer: Self-pay

## 2019-12-27 ENCOUNTER — Encounter: Payer: Self-pay | Admitting: Podiatry

## 2019-12-27 DIAGNOSIS — M79609 Pain in unspecified limb: Secondary | ICD-10-CM | POA: Diagnosis not present

## 2019-12-27 DIAGNOSIS — B353 Tinea pedis: Secondary | ICD-10-CM

## 2019-12-27 DIAGNOSIS — B351 Tinea unguium: Secondary | ICD-10-CM | POA: Diagnosis not present

## 2019-12-27 DIAGNOSIS — E119 Type 2 diabetes mellitus without complications: Secondary | ICD-10-CM

## 2019-12-27 MED ORDER — CLOTRIMAZOLE 1 % EX CREA: TOPICAL_CREAM | CUTANEOUS | 1 refills | Status: DC

## 2019-12-27 NOTE — Patient Instructions (Addendum)
Apply Neosporin Cream to left great toe once daily for one week.    Athlete's Foot  Athlete's foot (tinea pedis) is a fungal infection of the skin on your feet. It often occurs on the skin that is between or underneath the toes. It can also occur on the soles of your feet. The infection can spread from person to person (is contagious). It can also spread when a person's bare feet come in contact with the fungus on shower floors or on items such as shoes. What are the causes? This condition is caused by a fungus that grows in warm, moist places. You can get athlete's foot by sharing shoes, shower stalls, towels, and wet floors with someone who is infected. Not washing your feet or changing your socks often enough can also lead to athlete's foot. What increases the risk? This condition is more likely to develop in:  Men.  People who have a weak body defense system (immune system).  People who have diabetes.  People who use public showers, such as at a gym.  People who wear heavy-duty shoes, such as Environmental manager.  Seasons with warm, humid weather. What are the signs or symptoms? Symptoms of this condition include:  Itchy areas between your toes or on the soles of your feet.  White, flaky, or scaly areas between your toes or on the soles of your feet.  Very itchy small blisters between your toes or on the soles of your feet.  Small cuts in your skin. These cuts can become infected.  Thick or discolored toenails. How is this diagnosed? This condition may be diagnosed with a physical exam and a review of your medical history. Your health care provider may also take a skin or toenail sample to examine under a microscope. How is this treated? This condition is treated with antifungal medicines. These may be applied as powders, ointments, or creams. In severe cases, an oral antifungal medicine may be given. Follow these instructions at home: Medicines  Apply or take  over-the-counter and prescription medicines only as told by your health care provider.  Apply your antifungal medicine as told by your health care provider. Do not stop using the antifungal even if your condition improves. Foot care  Do not scratch your feet.  Keep your feet dry: ? Wear cotton or wool socks. Change your socks every day or if they become wet. ? Wear shoes that allow air to flow, such as sandals or canvas tennis shoes.  Wash and dry your feet, including the area between your toes. Also, wash and dry your feet: ? Every day or as told by your health care provider. ? After exercising. General instructions  Do not let others use towels, shoes, nail clippers, or other personal items that touch your feet.  Protect your feet by wearing sandals in wet areas, such as locker rooms and shared showers.  Keep all follow-up visits as told by your health care provider. This is important.  If you have diabetes, keep your blood sugar under control. Contact a health care provider if:  You have a fever.  You have swelling, soreness, warmth, or redness in your foot.  Your feet are not getting better with treatment.  Your symptoms get worse.  You have new symptoms. Summary  Athlete's foot (tinea pedis) is a fungal infection of the skin on your feet. It often occurs on skin that is between or underneath the toes.  This condition is caused by a fungus  that grows in warm, moist places.  Symptoms include white, flaky, or scaly areas between your toes or on the soles of your feet.  This condition is treated with antifungal medicines.  Keep your feet clean. Always dry them thoroughly. This information is not intended to replace advice given to you by your health care provider. Make sure you discuss any questions you have with your health care provider. Document Revised: 02/02/2017 Document Reviewed: 11/28/2016 Elsevier Patient Education  [place].

## 2019-12-29 NOTE — Progress Notes (Addendum)
Subjective:  Patient ID: Charles [name], male    DOB: 1955/03/31,  MRN: [id number]  64 y.o. male presents with preventative diabetic foot care and painful thick toenails that are difficult to trim. Pain interferes with ambulation. Aggravating factors include wearing enclosed shoe gear. Pain is relieved with periodic professional debridement..    Patient is accompanied by his brother, Charles [name], and sign language interpreter, Lucretia Roers from [place].   Brother, Charles [name], states Charles [name] had been complaining of pain on the bottom of his left foot, but he tells me via interpreter, plantar aspect of the foot is not hurting.   He does have moderately elongated fungal toenails b/l great toes which are tender when I apply pressure.   Review of Systems: Negative except as noted in the HPI.  Past Medical History:  Diagnosis Date  . Anemia   . Anxiety   . Asthma   . Deaf   . Depression   . Essential hypertension   . GERD (gastroesophageal reflux disease)   . History of migraine   . History of stroke    Spastic hemiplegia, right MCA stroke April 2014 in [place]  . Mixed hyperlipidemia   . PAD (peripheral artery disease) ([place])   . Sarcoidosis   . Seizures ([place])    Last one over 2 yrs ago in 2018, controlled with med  . Shingles   . Type 2 diabetes mellitus ([place])    Past Surgical History:  Procedure Laterality Date  . AIR/FLUID EXCHANGE Left 10/04/2018   Procedure: Air/Fluid Exchange;  Surgeon: Bernarda Caffey, MD;  Location: [facility];  Service: Ophthalmology;  Laterality: Left;  . BRAVO PH STUDY N/A 06/18/2013   pH STUDY SHOWS NEXIUM TWICE DAILY CONTROLS THE ACID IN HIS STOMACH. HE HAD VERY FEWEPISODE OF REGURGITATION RECORDED IN THE 2 DAYS THE STUDY WAS PERFORMED.   [name] COLONOSCOPY  2011   [place]  . ESOPHAGOGASTRODUODENOSCOPY N/A 06/18/2013   Dr.Fields- probable proximal esophageal web,dilation performed, bravo cap placed, mild non-erosive gastritis in the gastric antrum and on the greater  curvature of the gastric body bx- granulomatous gastritis, duodenal mucosa showed no abnormalities in the bulb and second portion of the duodenum.   . ESOPHAGOGASTRODUODENOSCOPY N/A 05/05/2015   Procedure: ESOPHAGOGASTRODUODENOSCOPY (EGD);  Surgeon: Danie Binder, MD;  [facility];  Service: Endoscopy;  Laterality: N/A;  730  . INJECTION OF SILICONE OIL Left 9/62/2297   Procedure: Injection Of Silicone Oil;  Surgeon: Bernarda Caffey, MD;  Location: [facility];  Service: Ophthalmology;  Laterality: Left;  . LASER PHOTO ABLATION Left 10/04/2018   Procedure: Laser Photo Ablation;  Surgeon: Bernarda Caffey, MD;  Location: [facility];  Service: Ophthalmology;  Laterality: Left;  [name] MALONEY DILATION N/A 06/18/2013   Procedure: MALONEY DILATION;  Surgeon: Danie Binder, MD;  [facility];  Service: Endoscopy;  Laterality: N/A;  . OTHER SURGICAL HISTORY     cyst removal head and chest  . PARS PLANA VITRECTOMY Left 10/04/2018   Procedure: PARS PLANA VITRECTOMY WITH 25 GAUGE;  Surgeon: Bernarda Caffey, MD;  Location: [facility];  Service: Ophthalmology;  Laterality: Left;  . POSTERIOR CERVICAL FUSION/FORAMINOTOMY N/A 10/07/2015   Procedure: Cervical Three-Four, Cervical Four-Five, Cervical Five-Six, Cervical Six-Seven Posterior Cervical Laminectomy and Fusion;  Surgeon: Kary Kos, MD;  Location: [facility];  Service: Neurosurgery;  Laterality: N/A;  . SAVORY DILATION N/A 06/18/2013   Procedure: SAVORY DILATION;  Surgeon: Danie Binder, MD;  [facility];  Service: Endoscopy;  Laterality: N/A;  . TEE WITHOUT CARDIOVERSION N/A 10/04/2012   Procedure: TRANSESOPHAGEAL ECHOCARDIOGRAM (TEE);  Surgeon: Thayer Headings, MD;  Location: [facility];  Service: Cardiovascular;  Laterality: N/A;  . [place] accident  Both legs fractured and repaired surgically   Patient Active Problem List   Diagnosis Date Noted  . Coagulation defect ([place]) 07/09/2019  . Cerebrovascular  accident (CVA) due to thrombosis of right middle cerebral artery ([place]) 03/18/2019  . Chronic nonintractable headache 09/12/2018  . Chronic migraine 09/12/2018  . Cerebrovascular accident (CVA) ([place]) 03/01/2018  . Polypharmacy 03/01/2018  . 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 Nonintractable headache 06/27/2017  . Mixed hyperlipidemia 05/17/2017  . Uncontrolled type 2 diabetes mellitus with hyperglycemia ([place]) 05/16/2017  . Thrombocytopenia ([place]) 09/13/2016  . Hypoglycemia   . Syncope 09/12/2016  . Seizure disorder as sequela of cerebrovascular accident ([place]) 09/12/2016  . MDD (major depressive disorder), recurrent episode, severe ([place]) 12/30/2015  . Suicidal ideation   . Other pancytopenia ([place]) 12/23/2015  . Loss of weight 11/12/2015  . 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 Iron deficiency anemia 09/22/2015  . Spinal stenosis in cervical region 08/21/2015  . Neck pain 08/12/2015  . Low back pain 08/12/2015  . Abnormality of gait 08/12/2015  . Neuropathy of right upper extremity 06/29/2015  . Acute rhinitis 05/05/2015  . Abdominal pain, epigastric 04/23/2015  . Arm pain, right 04/23/2015  . Atypical chest pain 07/23/2013  . 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 Dyspepsia 06/13/2013  . Dysphagia 06/13/2013  . Headache 11/30/2012  . Solitary pulmonary nodule 11/20/2012  . Spastic hemiplegia affecting left nondominant side ([place]) 11/08/2012  . Essential hypertension, benign 10/12/2012  . COPD GOLD II with reversibility  10/12/2012  . 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 CVA (cerebral vascular accident) ([place]) 10/03/2012  . Diabetes ([place]) 10/03/2012  . Sarcoidosis ([place]) 10/03/2012    Current Outpatient Medications:  .  albuterol (PROAIR HFA) 108 (90 Base) MCG/ACT inhaler, Inhale 2 puffs into the lungs every 4 (four) hours as needed for wheezing or shortness of breath., Disp: 6.7  g, Rfl: 5 .  amLODipine (NORVASC) 5 MG tablet, Take 5 mg by mouth daily., Disp: , Rfl:  .  atorvastatin (LIPITOR) 10 MG tablet, Take 10 mg by mouth daily. (cholesterol), Disp: , Rfl:  .  atropine 1 % ophthalmic solution, INSTILL 1 DROP IN LEFT EYE TWICE DAILY, Disp: 5 mL, Rfl: 1 .  bacitracin-polymyxin b (POLYSPORIN) ophthalmic ointment, Place 1 application into the left eye 4 (four) times daily. apply to eye every 12 hours while awake, Disp: 3.5 g, Rfl: 6 .  brimonidine (ALPHAGAN) 0.2 % ophthalmic solution, Place 1 drop into the left eye 3 (three) times daily., Disp: 10 mL, Rfl: 3 .  clopidogrel (PLAVIX) 75 MG tablet, Take 75 mg by mouth daily., Disp: , Rfl:  .  dorzolamide-timolol (COSOPT) 22.3-6.8 MG/ML ophthalmic solution, INSTILL 1 DROP IN LEFT EYE THREE TIMES DAILY, Disp: 10 mL, Rfl: 3 .  gatifloxacin (ZYMAXID) 0.5 % SOLN, Place 1 drop into the left eye 4 (four) times daily., Disp: , Rfl:  .  ibuprofen (ADVIL) 600 MG tablet, Take 600 mg by mouth every 6 (six) hours as needed., Disp: , Rfl:  .  IncobotulinumtoxinA ([name] IM), Inject 500 Units into the muscle every 3 (three) months., Disp: , Rfl:  .  lamoTRIgine (LAMICTAL) 100 MG tablet, Take two tablets twice daily.,  Disp: 360 tablet, Rfl: 4 .  losartan (COZAAR) 100 MG tablet, Take 100 mg by mouth daily., Disp: , Rfl:  .  omeprazole (PRILOSEC) 20 MG capsule, Take 20 mg by mouth daily., Disp: , Rfl:  .  prednisoLONE acetate (PRED FORTE) 1 % ophthalmic suspension, Place 1 drop into the left eye 4 (four) times daily., Disp: 15 mL, Rfl: 0 .  tamsulosin (FLOMAX) 0.4 MG CAPS capsule, Take 0.4 mg by mouth daily., Disp: , Rfl:  .  traZODone (DESYREL) 50 MG tablet, Take 2 tablets (100 mg total) by mouth at bedtime., Disp: 60 tablet, Rfl: 11 .  umeclidinium-vilanterol (ANORO ELLIPTA) 62.5-25 MCG/INH AEPB, Inhale 1 puff into the lungs daily., Disp: 1 each, Rfl: 5 .  clotrimazole (LOTRIMIN) 1 % cream, Apply to both feet and between toes twice daily for 4  weeks., Disp: 45 g, Rfl: 1  Current Facility-Administered Medications:  .  incobotulinumtoxinA ([name]) 100 units injection 500 Units, 500 Units, Intramuscular, Q90 days, Marcial Pacas, MD, 500 Units at 06/19/19 1459 Allergies  Allergen Reactions  . Iodinated Diagnostic Agents Itching and Anaphylaxis  . Iodine-131 Itching and Swelling  . Hydrocodone Other (See Comments)    Makes "looney"  . Shellfish Allergy Hives and Swelling  . Metformin Diarrhea  . Oxycodone Nausea And Vomiting   Social History   Tobacco Use  Smoking Status Former Smoker  . Packs/day: 1.00  . Years: 29.00  . Pack years: 29.00  . Types: Cigarettes  . Quit date: 02/22/1992  . Years since quitting: 27.8  Smokeless Tobacco Never Used    Objective:  There were no vitals filed for this visit. Constitutional Patient is a pleasant 64 y.o. African American male in NAD. AAO x 3.  Vascular Capillary refill time to digits immediate b/l. Palpable pedal pulses b/l LE. Pedal hair absent. Lower extremity skin temperature gradient within normal limits. No cyanosis or clubbing noted.  Neurologic Normal speech. Protective sensation intact 5/5 intact bilaterally with 10g monofilament b/l.  Dermatologic Pedal skin with normal turgor, texture and tone bilaterally. No open wounds bilaterally. No interdigital macerations bilaterally. Toenails 1-5 b/l elongated, discolored, dystrophic, thickened, crumbly with subungual debris and tenderness to dorsal palpation. Incurvated nailplate medial border(s) L hallux.  Nail border hypertrophy absent. There is tenderness to palpation. Sign(s) of infection: no clinical signs of infection noted on examination today.. Diffuse scaling noted peripherally and plantarly b/l feet with mild foot odor.  No interdigital macerations.  No blisters, no weeping. No signs of secondary bacterial infection noted.  Orthopedic: Muscle strength 5/5 to all LE muscle groups of right lower extremity. Dropfoot left lower  extremity. No pain crepitus or joint limitation noted with ROM b/l. No gross bony deformities bilaterally.   Hemoglobin A1C Latest Ref Rng & Units 09/12/2019  HGBA1C 4.8 - 5.6 % 7.1(H)  Some recent data might be hidden   Assessment:   1. Pain due to onychomycosis of nail   2. Tinea pedis of both feet   3. Diabetes mellitus without complication ([place])    Plan:  Patient was evaluated and treated and all questions answered.  Onychomycosis with pain -Nails palliatively debridement as below. -Educated on self-care  Procedure: Nail Debridement Rationale: Pain Type of Debridement: manual, sharp debridement. Instrumentation: Nail nipper, rotary burr. Number of Nails: 10  -Examined patient. -Continue diabetic foot care principles. -Patient to continue soft, supportive shoe gear daily. -Toenails 1-5 b/l were debrided in length and girth with sterile nail nippers and dremel without iatrogenic bleeding. -Offending  nail borders debrided and curretaged L hallux. Borders cleansed with alcohol. Antibiotic ointment applied.Bother, Charles [name], was instructed to apply Neosporin Cream to digit once daily for one week. He related understanding. Call office if he encounters any problems. -Patient to report any pedal injuries to medical professional immediately. -For tinea pedis, Rx sent to pharmacy for Clotrimazole Cream 1% to be applied to feet twice daily for 4 weeks. -Patient/POA to call should there be question/concern in the interim.  Return in about 3 months (around 03/28/2020).  Marzetta Board, DPM

## 2020-01-01 ENCOUNTER — Ambulatory Visit: Payer: Medicare Other | Admitting: Neurology

## 2020-01-02 DIAGNOSIS — F332 Major depressive disorder, recurrent severe without psychotic features: Secondary | ICD-10-CM | POA: Diagnosis not present

## 2020-01-21 DIAGNOSIS — F332 Major depressive disorder, recurrent severe without psychotic features: Secondary | ICD-10-CM | POA: Diagnosis not present

## 2020-01-22 ENCOUNTER — Other Ambulatory Visit (INDEPENDENT_AMBULATORY_CARE_PROVIDER_SITE_OTHER): Payer: Self-pay | Admitting: Ophthalmology

## 2020-02-11 DIAGNOSIS — Z125 Encounter for screening for malignant neoplasm of prostate: Secondary | ICD-10-CM | POA: Diagnosis not present

## 2020-02-11 DIAGNOSIS — Z1339 Encounter for screening examination for other mental health and behavioral disorders: Secondary | ICD-10-CM | POA: Diagnosis not present

## 2020-02-11 DIAGNOSIS — Z7189 Other specified counseling: Secondary | ICD-10-CM | POA: Diagnosis not present

## 2020-02-11 DIAGNOSIS — Z23 Encounter for immunization: Secondary | ICD-10-CM | POA: Diagnosis not present

## 2020-02-11 DIAGNOSIS — Z299 Encounter for prophylactic measures, unspecified: Secondary | ICD-10-CM | POA: Diagnosis not present

## 2020-02-11 DIAGNOSIS — Z79899 Other long term (current) drug therapy: Secondary | ICD-10-CM | POA: Diagnosis not present

## 2020-02-11 DIAGNOSIS — Z1331 Encounter for screening for depression: Secondary | ICD-10-CM | POA: Diagnosis not present

## 2020-02-11 DIAGNOSIS — I10 Essential (primary) hypertension: Secondary | ICD-10-CM | POA: Diagnosis not present

## 2020-02-11 DIAGNOSIS — Z6830 Body mass index (BMI) 30.0-30.9, adult: Secondary | ICD-10-CM | POA: Diagnosis not present

## 2020-02-11 DIAGNOSIS — Z0000 Encounter for general adult medical examination without abnormal findings: Secondary | ICD-10-CM | POA: Diagnosis not present

## 2020-02-11 DIAGNOSIS — E7800 Pure hypercholesterolemia, unspecified: Secondary | ICD-10-CM | POA: Diagnosis not present

## 2020-02-11 DIAGNOSIS — R5383 Other fatigue: Secondary | ICD-10-CM | POA: Diagnosis not present
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
## 2020-02-12 ENCOUNTER — Ambulatory Visit (INDEPENDENT_AMBULATORY_CARE_PROVIDER_SITE_OTHER): Payer: Medicare Other | Admitting: Ophthalmology

## 2020-02-12 ENCOUNTER — Other Ambulatory Visit: Payer: Self-pay

## 2020-02-12 DIAGNOSIS — H3589 Other specified retinal disorders: Secondary | ICD-10-CM | POA: Diagnosis not present

## 2020-02-12 DIAGNOSIS — H4312 Vitreous hemorrhage, left eye: Secondary | ICD-10-CM

## 2020-02-12 DIAGNOSIS — E119 Type 2 diabetes mellitus without complications: Secondary | ICD-10-CM

## 2020-02-12 DIAGNOSIS — H4051X3 Glaucoma secondary to other eye disorders, right eye, severe stage: Secondary | ICD-10-CM | POA: Diagnosis not present

## 2020-02-12 DIAGNOSIS — H3322 Serous retinal detachment, left eye: Secondary | ICD-10-CM

## 2020-02-12 DIAGNOSIS — H25813 Combined forms of age-related cataract, bilateral: Secondary | ICD-10-CM

## 2020-02-12 DIAGNOSIS — H3581 Retinal edema: Secondary | ICD-10-CM | POA: Diagnosis not present

## 2020-02-12 DIAGNOSIS — H913 Deaf nonspeaking, not elsewhere classified: Secondary | ICD-10-CM | POA: Diagnosis not present

## 2020-02-13 ENCOUNTER — Encounter (INDEPENDENT_AMBULATORY_CARE_PROVIDER_SITE_OTHER): Payer: Self-pay | Admitting: Ophthalmology

## 2020-02-24 DIAGNOSIS — Z299 Encounter for prophylactic measures, unspecified: Secondary | ICD-10-CM | POA: Diagnosis not present

## 2020-02-24 DIAGNOSIS — R972 Elevated prostate specific antigen [PSA]: Secondary | ICD-10-CM | POA: Diagnosis not present

## 2020-02-24 DIAGNOSIS — I10 Essential (primary) hypertension: Secondary | ICD-10-CM | POA: Diagnosis not present

## 2020-02-24 DIAGNOSIS — I739 Peripheral vascular disease, unspecified: Secondary | ICD-10-CM | POA: Diagnosis not present

## 2020-02-24 DIAGNOSIS — E1165 Type 2 diabetes mellitus with hyperglycemia: Secondary | ICD-10-CM | POA: Diagnosis not present

## 2020-02-24 DIAGNOSIS — Z6829 Body mass index (BMI) 29.0-29.9, adult: Secondary | ICD-10-CM | POA: Diagnosis not present

## 2020-02-24 DIAGNOSIS — Z87891 Personal history of nicotine dependence: Secondary | ICD-10-CM | POA: Diagnosis not present

## 2020-02-25 DIAGNOSIS — F332 Major depressive disorder, recurrent severe without psychotic features: Secondary | ICD-10-CM | POA: Diagnosis not present

## 2020-03-04 DIAGNOSIS — F332 Major depressive disorder, recurrent severe without psychotic features: Secondary | ICD-10-CM | POA: Diagnosis not present

## 2020-03-12 ENCOUNTER — Encounter (HOSPITAL_COMMUNITY): Payer: Self-pay | Admitting: *Deleted

## 2020-03-12 ENCOUNTER — Other Ambulatory Visit: Payer: Self-pay

## 2020-03-12 ENCOUNTER — Emergency Department (HOSPITAL_COMMUNITY): Payer: Medicare Other

## 2020-03-12 ENCOUNTER — Emergency Department (HOSPITAL_COMMUNITY)
Admission: EM | Admit: 2020-03-12 | Discharge: 2020-03-14 | Disposition: A | Discharge status: Home / Self Care | Payer: Medicare Other | Attending: Emergency Medicine | Admitting: Emergency Medicine

## 2020-03-12 DIAGNOSIS — J45909 Unspecified asthma, uncomplicated: Secondary | ICD-10-CM | POA: Insufficient documentation

## 2020-03-12 DIAGNOSIS — Z7901 Long term (current) use of anticoagulants: Secondary | ICD-10-CM | POA: Insufficient documentation

## 2020-03-12 DIAGNOSIS — G529 Cranial nerve disorder, unspecified: Secondary | ICD-10-CM | POA: Insufficient documentation

## 2020-03-12 DIAGNOSIS — Z7902 Long term (current) use of antithrombotics/antiplatelets: Secondary | ICD-10-CM | POA: Insufficient documentation

## 2020-03-12 DIAGNOSIS — R531 Weakness: Secondary | ICD-10-CM | POA: Insufficient documentation

## 2020-03-12 DIAGNOSIS — S0001XA Abrasion of scalp, initial encounter: Secondary | ICD-10-CM | POA: Insufficient documentation

## 2020-03-12 DIAGNOSIS — E1140 Type 2 diabetes mellitus with diabetic neuropathy, unspecified: Secondary | ICD-10-CM | POA: Diagnosis not present

## 2020-03-12 DIAGNOSIS — S0990XA Unspecified injury of head, initial encounter: Secondary | ICD-10-CM | POA: Diagnosis not present

## 2020-03-12 DIAGNOSIS — Z20822 Contact with and (suspected) exposure to covid-19: Secondary | ICD-10-CM | POA: Insufficient documentation

## 2020-03-12 DIAGNOSIS — E1165 Type 2 diabetes mellitus with hyperglycemia: Secondary | ICD-10-CM | POA: Insufficient documentation

## 2020-03-12 DIAGNOSIS — Z299 Encounter for prophylactic measures, unspecified: Secondary | ICD-10-CM | POA: Diagnosis not present

## 2020-03-12 DIAGNOSIS — R29818 Other symptoms and signs involving the nervous system: Secondary | ICD-10-CM | POA: Diagnosis not present

## 2020-03-12 DIAGNOSIS — R296 Repeated falls: Secondary | ICD-10-CM | POA: Diagnosis not present

## 2020-03-12 DIAGNOSIS — Z043 Encounter for examination and observation following other accident: Secondary | ICD-10-CM | POA: Diagnosis not present

## 2020-03-12 DIAGNOSIS — W19XXXA Unspecified fall, initial encounter: Secondary | ICD-10-CM | POA: Insufficient documentation

## 2020-03-12 DIAGNOSIS — Z8673 Personal history of transient ischemic attack (TIA), and cerebral infarction without residual deficits: Secondary | ICD-10-CM | POA: Diagnosis not present

## 2020-03-12 DIAGNOSIS — Z79899 Other long term (current) drug therapy: Secondary | ICD-10-CM | POA: Diagnosis not present

## 2020-03-12 DIAGNOSIS — Z87891 Personal history of nicotine dependence: Secondary | ICD-10-CM | POA: Insufficient documentation

## 2020-03-12 DIAGNOSIS — G9389 Other specified disorders of brain: Secondary | ICD-10-CM | POA: Diagnosis not present

## 2020-03-12 DIAGNOSIS — R32 Unspecified urinary incontinence: Secondary | ICD-10-CM | POA: Diagnosis not present

## 2020-03-12 DIAGNOSIS — J449 Chronic obstructive pulmonary disease, unspecified: Secondary | ICD-10-CM | POA: Diagnosis not present

## 2020-03-12 DIAGNOSIS — R350 Frequency of micturition: Secondary | ICD-10-CM | POA: Diagnosis not present

## 2020-03-12 DIAGNOSIS — Z6829 Body mass index (BMI) 29.0-29.9, adult: Secondary | ICD-10-CM | POA: Diagnosis not present

## 2020-03-12 DIAGNOSIS — I10 Essential (primary) hypertension: Secondary | ICD-10-CM | POA: Insufficient documentation

## 2020-03-12 LAB — BRAIN NATRIURETIC PEPTIDE: B Natriuretic Peptide: 48 pg/mL (ref 0.0–100.0)

## 2020-03-12 LAB — COMPREHENSIVE METABOLIC PANEL
ALT: 22 U/L (ref 0–44)
AST: 24 U/L (ref 15–41)
Albumin: 4.2 g/dL (ref 3.5–5.0)
Alkaline Phosphatase: 63 U/L (ref 38–126)
Anion gap: 9 (ref 5–15)
BUN: 13 mg/dL (ref 8–23)
CO2: 29 mmol/L (ref 22–32)
Calcium: 9.2 mg/dL (ref 8.9–10.3)
Chloride: 103 mmol/L (ref 98–111)
Creatinine, Ser: 1.18 mg/dL (ref 0.61–1.24)
GFR, Estimated: >60 mL/min (ref >60)
Glucose, Bld: 121 mg/dL — ABNORMAL HIGH (ref 70–99)
Potassium: 4.2 mmol/L (ref 3.5–5.1)
Sodium: 141 mmol/L (ref 135–145)
Total Bilirubin: 0.3 mg/dL (ref 0.3–1.2)
Total Protein: 7.6 g/dL (ref 6.5–8.1)

## 2020-03-12 LAB — CBC WITH DIFFERENTIAL/PLATELET
Abs Immature Granulocytes: 0.02 10*3/uL (ref 0.00–0.07)
Basophils Absolute: 0 10*3/uL (ref 0.0–0.1)
Basophils Relative: 0 %
Eosinophils Absolute: 0.1 10*3/uL (ref 0.0–0.5)
Eosinophils Relative: 2 %
HCT: 43.1 % (ref 39.0–52.0)
Hemoglobin: 14.2 g/dL (ref 13.0–17.0)
Immature Granulocytes: 0 %
Lymphocytes Relative: 26 %
Lymphs Abs: 1.2 10*3/uL (ref 0.7–4.0)
MCH: 29.9 pg (ref 26.0–34.0)
MCHC: 32.9 g/dL (ref 30.0–36.0)
MCV: 90.7 fL (ref 80.0–100.0)
Monocytes Absolute: 0.4 10*3/uL (ref 0.1–1.0)
Monocytes Relative: 9 %
Neutro Abs: 2.8 10*3/uL (ref 1.7–7.7)
Neutrophils Relative %: 63 %
Platelets: 116 10*3/uL — ABNORMAL LOW (ref 150–400)
RBC: 4.75 MIL/uL (ref 4.22–5.81)
RDW: 12.7 % (ref 11.5–15.5)
WBC: 4.5 10*3/uL (ref 4.0–10.5)
nRBC: 0 % (ref 0.0–0.2)

## 2020-03-12 LAB — PROTIME-INR
INR: 1 (ref 0.8–1.2)
Prothrombin Time: 12.5 seconds (ref 11.4–15.2)

## 2020-03-12 LAB — LIPASE, BLOOD: Lipase: 29 U/L (ref 11–51)

## 2020-03-12 MED ORDER — SODIUM CHLORIDE 0.9 % IV SOLN: INTRAVENOUS | Status: DC

## 2020-03-12 NOTE — ED Triage Notes (Signed)
Fall 2 days ago, history of stroke, brother states he is less mobile after the fall and is now incontinent

## 2020-03-13 ENCOUNTER — Emergency Department (HOSPITAL_COMMUNITY): Payer: Medicare Other

## 2020-03-13 DIAGNOSIS — R531 Weakness: Secondary | ICD-10-CM | POA: Diagnosis not present

## 2020-03-13 DIAGNOSIS — R296 Repeated falls: Secondary | ICD-10-CM | POA: Diagnosis not present

## 2020-03-13 DIAGNOSIS — R29818 Other symptoms and signs involving the nervous system: Secondary | ICD-10-CM | POA: Diagnosis not present

## 2020-03-13 DIAGNOSIS — Z8673 Personal history of transient ischemic attack (TIA), and cerebral infarction without residual deficits: Secondary | ICD-10-CM | POA: Diagnosis not present

## 2020-03-13 DIAGNOSIS — G9389 Other specified disorders of brain: Secondary | ICD-10-CM | POA: Diagnosis not present

## 2020-03-13 LAB — URINALYSIS, ROUTINE W REFLEX MICROSCOPIC
Bacteria, UA: NONE SEEN
Bilirubin Urine: NEGATIVE
Glucose, UA: >=500 mg/dL — AB
Ketones, ur: NEGATIVE mg/dL
Leukocytes,Ua: NEGATIVE
Nitrite: NEGATIVE
Protein, ur: NEGATIVE mg/dL
Specific Gravity, Urine: 1.016 (ref 1.005–1.030)
pH: 6 (ref 5.0–8.0)

## 2020-03-13 LAB — SARS CORONAVIRUS 2 (TAT 6-24 HRS): SARS Coronavirus 2: NEGATIVE

## 2020-03-13 MED ORDER — SODIUM CHLORIDE 0.9 % IV SOLN: INTRAVENOUS | Status: DC

## 2020-03-13 MED ORDER — AMLODIPINE BESYLATE 5 MG PO TABS
5.0000 mg | ORAL_TABLET | Freq: Once | ORAL | Status: AC
  Administered 2020-03-13: 5 mg via ORAL
  Filled 2020-03-13: qty 1

## 2020-03-13 NOTE — TOC Transition Note (Signed)
Transition of Care [facility]) - CM/SW Discharge Note   Patient Details  Name: Charles [name] MRN: [id number] Date of Birth: Apr 26, 1955  [facility]) CM/SW Contact:  Iona Beard, [place] Phone Number: 03/13/2020, 7:51 PM   Clinical Narrative:    CSW confirmed with pts brother [name] that pt had bed offer at [name], [name] agreeable to accepting bed offer. Pt discharging to [name]. CSW spoke to [place] with admissions to confirm pt could be transferred today, she confirms and states that CSW needs to send discharge paperwork that has updated medication list. CSW faxed paperwork. CSW later updated that upon calling report facility states they do not know pt is coming. CSW updated that facility needs discharge summary. CSW explained to facility that pt discharge paperwork from ED is different than discharge after admission. CSW informed that pts reason for presenting and what was done in ED would be on AVS along with updated medication list. CSW confirmed that pt can be sent to [name]. TOC signing off.    Final next level of care: Skilled Nursing Facility Barriers to Discharge: ED No Barriers   Patient Goals and CMS Choice Patient states their goals for this hospitalization and ongoing recovery are:: Go to SNF CMS Medicare.gov Compare Post Acute Care list provided to:: Patient Represenative (must comment) [name] (Brother)) Choice offered to / list presented to : [name] POA / Guardian,Sibling  Discharge Placement PASRR number recieved: 03/13/20            Patient chooses bed at: Other - please specify in the comment section below: [name]) Patient to be transferred to facility by: EMS Name of family member notified: [name] Patient and family notified of of transfer: 03/13/20  Discharge Plan and Services In-house Referral: Clinical Social Work Discharge Planning Services: NA Post Acute Care Choice: [name]           DME Arranged: N/A DME Agency: NA       HH Arranged: NA HH Agency: NA        Social Determinants of Health (SDOH) Interventions     Readmission Risk Interventions No flowsheet data found.

## 2020-03-13 NOTE — ED Notes (Signed)
Pt spilled his urinal, removed wet clothing, cleaned up spill, called evs to mop floor, placed pt in high fall socks.  Helped pt back into bed and repositioned pt.  Pt offers no other needs at this time.  Bed in low position, call bell within reach.

## 2020-03-13 NOTE — TOC Progression Note (Signed)
Transition of Care [facility]) - Progression Note   Patient Details  Name: MAGNUS CRESCENZO MRN: [id number] Date of Birth: 23-May-1955  Transition [facility] Care [facility]) CM/SW [place], LCSW Phone Number: 03/13/2020, 3:49 PM  Clinical Narrative: PASRR received: [id number].  Expected Discharge Plan: [place] Barriers to Discharge: SNF Pending bed offer,Awaiting State Approval (PASRR)  Expected Discharge Plan and Services Expected Discharge Plan: [place] In-house Referral: Clinical Social Work Discharge Planning Services: NA Post Acute Care Choice: [place] Living arrangements for the past 2 months: Single Family Home              DME Arranged: N/A DME Agency: NA HH Arranged: NA [place] Agency: NA  Readmission Risk Interventions No flowsheet data found.

## 2020-03-13 NOTE — ED Notes (Signed)
Pt unable to ambulate. assisted x2 techs but was very weak and unable to lift legs to take steps. Pt w/c to bathroom

## 2020-03-13 NOTE — ED Notes (Signed)
Spoke with staff at [facility] regarding this patients admission. Staff stating they "have no idea this patient is coming." Staff is requesting H&P, medication list, and D/C summary. Advised facility they were provided with AVS and that had patients D/C instructions and updated medication list along with the provider who saw that patient at D/C.  Spoke with Aldona Bar (Education officer, museum) who stated she will refax patient paper work and speak with admissions regarding patient.

## 2020-03-13 NOTE — ED Notes (Signed)
Per MSW pt was accepted at [facility] and will go to room 119a, attempted to call report to number [phone] no answer

## 2020-03-13 NOTE — Discharge Instructions (Addendum)
Charles [name] was in the ED for weakness and falls. MRI was negative and no injury was identified. Physical Therapy has recommend SNF placement for rehab. Family agrees with this plan, they are unable to care for him at home.

## 2020-03-13 NOTE — Plan of Care (Signed)
  Problem: Acute Rehab PT Goals(only PT should resolve) Goal: Pt Will Go Supine/Side To Sit Outcome: Progressing Flowsheets (Taken 03/13/2020 1223) Pt will go Supine/Side to Sit:  with modified independence  with supervision Goal: Patient Will Transfer Sit To/From Stand Outcome: Progressing [place] (Taken 03/13/2020 1223) Patient will transfer sit to/from stand:  with min guard assist  with minimal assist Goal: Pt Will Transfer Bed To Chair/Chair To Bed Outcome: Progressing Flowsheets (Taken 03/13/2020 1223) Pt will Transfer Bed to Chair/Chair to Bed:  with min assist  min guard assist Goal: Pt Will Ambulate Outcome: Progressing Flowsheets (Taken 03/13/2020 1223) Pt will Ambulate:  50 feet  with minimal assist Note: Quad-cane   12:24 PM, 03/13/20 Lonell Grandchild, MPT Physical Therapist with [facility] [phone] office [phone] mobile phone

## 2020-03-13 NOTE — Care Management (Cosign Needed)
RE: Jabes Primo Date of Birth: 1955-11-19 Date: 03/13/2020 MUST ID: [id number]  To Whom It May Concern:  Please be advised that the above name patient will require a short-term nursing home stay--anticipated 30 days or less rehabilitation and strengthening. The plan is for return home.

## 2020-03-13 NOTE — ED Provider Notes (Addendum)
[facility] EMERGENCY DEPARTMENT Provider Note   CSN: [id number] Arrival date & time: 03/12/20  1329     History Chief Complaint  Patient presents with  . Fall    Charles [name] is a 65 y.o. male.  Patient brought in by family members.  Triage nurse stated patient brought in by brother.  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  We used a signer to communicate with him.  Patient has a history of prior strokes.  Has weakness to his left upper extremity greater than his left lower extremity.  Patient has a history of sarcoidosis.  And type 2 diabetes.  Patient had his stroke in 2014 it was a right MCA stroke.  He has spastic hemiplegia.  Family brought him in because he has fallen multiple times this week and is not able to walk on his own like he used to be able to.  And they are no longer do appropriately be able to care for him.  They are stating he needs to go to rehab.  There has not been any history of any breathing difficulties nausea vomiting or diarrhea.  No complaint of abdominal pain chest pain.        Past Medical History:  Diagnosis Date  . Anemia   . Anxiety   . Asthma   . Deaf   . Depression   . Essential hypertension   . GERD (gastroesophageal reflux disease)   . History of migraine   . History of stroke    Spastic hemiplegia, right MCA stroke April 2014 in [place]  . Mixed hyperlipidemia   . PAD (peripheral artery disease) ([place])   . Sarcoidosis   . Seizures ([place])    Last one over 2 yrs ago in 2018, controlled with med  . Shingles   . Type 2 diabetes mellitus [facility])     Patient Active Problem List   Diagnosis Date Noted  . 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 Cerebrovascular accident (CVA) due to thrombosis of right middle cerebral artery ([place]) 03/18/2019  . Chronic nonintractable headache 09/12/2018  . Chronic migraine 09/12/2018  . Cerebrovascular accident (CVA) ([place]) 03/01/2018  . Polypharmacy 03/01/2018  . 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 Nonintractable headache 06/27/2017  . Mixed hyperlipidemia 05/17/2017  . Uncontrolled type 2 diabetes mellitus with hyperglycemia ([place]) 05/16/2017  . Thrombocytopenia ([place]) 09/13/2016  . Hypoglycemia   . Syncope 09/12/2016  . Seizure disorder as sequela of cerebrovascular accident ([place]) 09/12/2016  . MDD (major depressive disorder), recurrent episode, severe ([place]) 12/30/2015  . Suicidal ideation   . Other pancytopenia ([place]) 12/23/2015  . Loss of weight 11/12/2015  . 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 Iron deficiency anemia 09/22/2015  . Spinal stenosis in cervical region 08/21/2015  . Neck pain 08/12/2015  . Low back pain 08/12/2015  . Abnormality of gait 08/12/2015  . Neuropathy of right upper extremity 06/29/2015  . Acute rhinitis 05/05/2015  . Abdominal pain, epigastric 04/23/2015  . Arm pain, right 04/23/2015  . Atypical chest pain 07/23/2013  . 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 Dyspepsia 06/13/2013  . Dysphagia 06/13/2013  . Headache 11/30/2012  . Solitary pulmonary nodule 11/20/2012  . Spastic hemiplegia affecting left nondominant side ([place]) 11/08/2012  . Essential hypertension, benign 10/12/2012  . COPD GOLD II with reversibility  10/12/2012  . 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 CVA (cerebral vascular accident) ([place]) 10/03/2012  . Diabetes ([place])  10/03/2012  . Sarcoidosis ([place]) 10/03/2012    Past Surgical History:  Procedure Laterality Date  . AIR/FLUID EXCHANGE Left 10/04/2018   Procedure: Air/Fluid Exchange;  Surgeon: Bernarda Caffey, MD;  Location: [place];  Service: Ophthalmology;  Laterality: Left;  . BRAVO PH STUDY N/A 06/18/2013   pH STUDY SHOWS NEXIUM TWICE DAILY CONTROLS THE ACID IN HIS STOMACH. HE HAD VERY FEWEPISODE OF REGURGITATION RECORDED IN THE 2 DAYS THE STUDY WAS PERFORMED.   [name] COLONOSCOPY  2011   IN [place]  .  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   . ESOPHAGOGASTRODUODENOSCOPY N/A 05/05/2015   Procedure: ESOPHAGOGASTRODUODENOSCOPY (EGD);  Surgeon: Danie Binder, MD;  [facility];  Service: Endoscopy;  Laterality: N/A;  730  . INJECTION OF SILICONE OIL Left [id number]   Procedure: Injection Of Silicone Oil;  Surgeon: Bernarda Caffey, MD;  Location: [place];  Service: Ophthalmology;  Laterality: Left;  . LASER PHOTO ABLATION Left 10/04/2018   Procedure: Laser Photo Ablation;  Surgeon: Bernarda Caffey, MD;  Location: [place];  Service: Ophthalmology;  Laterality: Left;  [name] MALONEY DILATION N/A 06/18/2013   Procedure: MALONEY DILATION;  Surgeon: Danie Binder, MD;  [facility];  Service: Endoscopy;  Laterality: N/A;  . OTHER SURGICAL HISTORY     cyst removal head and chest  . PARS PLANA VITRECTOMY Left 10/04/2018   Procedure: PARS PLANA VITRECTOMY WITH 25 GAUGE;  Surgeon: Bernarda Caffey, MD;  Location: [place];  Service: Ophthalmology;  Laterality: Left;  . POSTERIOR CERVICAL FUSION/FORAMINOTOMY N/A 10/07/2015   Procedure: Cervical Three-Four, Cervical Four-Five, Cervical Five-Six, Cervical Six-Seven Posterior Cervical Laminectomy and Fusion;  Surgeon: Kary Kos, MD;  Location: [place] NEURO ORS;  Service: Neurosurgery;  Laterality: N/A;  . SAVORY DILATION N/A 06/18/2013   Procedure: SAVORY DILATION;  Surgeon: Danie Binder, MD;  [facility];  Service: Endoscopy;  Laterality: N/A;  . TEE WITHOUT CARDIOVERSION N/A 10/04/2012   Procedure: TRANSESOPHAGEAL ECHOCARDIOGRAM (TEE);  Surgeon: Thayer Headings, MD;  Location: [facility] ENDOSCOPY;  Service: Cardiovascular;  Laterality: N/A;  . [place] accident  Both legs fractured and repaired surgically       Family History  Problem Relation Age of Onset  . Diabetes Mother   . Hypertension Mother   . Glaucoma Mother   . Retinal detachment Mother   . Hypertension Father   . Diabetes Brother   . Glaucoma Brother   . Colon polyps Neg Hx   . Colon cancer Neg Hx     Social History   Tobacco Use  . Smoking status: Former Smoker    Packs/day: 1.00    Years: 29.00    Pack years: 29.00    Types: Cigarettes    Quit date: 02/22/1992    Years since quitting: 28.0  . Smokeless tobacco: Never Used  Vaping Use  . Vaping Use: Never used  Substance Use Topics  . Alcohol use: Not Currently    Comment: occasionally beer  . Drug use: Yes    Types: Marijuana, "Crack" cocaine    Comment: hx of crack cocaine "long time ago" per pt - hard to tell how long it is has been since he used,    Home  Medications Prior to Admission medications   Medication Sig Start Date End Date Taking? Authorizing Provider  albuterol (PROAIR HFA) 108 (90 Base) MCG/ACT inhaler Inhale 2 puffs into the lungs every 4 (four) hours as needed for wheezing or shortness of breath. 08/01/19   Chesley Mires, MD  amLODipine (NORVASC) 5 MG tablet Take 5 mg by mouth daily.    [provider]  atorvastatin (LIPITOR) 10 MG tablet Take 10 mg by mouth daily. (cholesterol)    [provider]  atropine 1 % ophthalmic solution INSTILL 1 DROP IN LEFT EYE TWICE DAILY 03/24/19   Bernarda Caffey, MD  bacitracin-polymyxin b (POLYSPORIN) ophthalmic ointment Place 1 application into the left eye 4 (four) times daily. apply to eye every 12 hours while awake 10/23/18   Bernarda Caffey, MD  brimonidine [facility]) 0.2 % ophthalmic solution Place 1 drop into the left eye 3 (three) times daily. 12/04/18   Bernarda Caffey, MD  clopidogrel (PLAVIX) 75 MG tablet Take 75 mg by mouth daily.    [provider]  clotrimazole (LOTRIMIN) 1 % cream Apply to both  feet and between toes twice daily for 4 weeks. 12/27/19   Marzetta Board, DPM  dorzolamide-timolol (COSOPT) 22.3-6.8 MG/ML ophthalmic solution INSTILL 1 DROP IN LEFT EYE THREE TIMES DAILY 03/04/19   Bernarda Caffey, MD  gatifloxacin (ZYMAXID) 0.5 % SOLN Place 1 drop into the left eye 4 (four) times daily.    [provider]  ibuprofen (ADVIL) 600 MG tablet Take 600 mg by mouth every 6 (six) hours as needed.    [provider]  IncobotulinumtoxinA ([name] IM) Inject 500 Units into the muscle every 3 (three) months.    [provider]  lamoTRIgine (LAMICTAL) 100 MG tablet Take two tablets twice daily. 06/19/19   Marcial Pacas, MD  losartan (COZAAR) 100 MG tablet Take 100 mg by mouth daily.    [provider]  omeprazole (PRILOSEC) 20 MG capsule Take 20 mg by mouth daily.    [provider]  prednisoLONE acetate (PRED FORTE) 1 % ophthalmic suspension Place 1 drop into the left eye 4 (four) times daily. 10/16/18   Bernarda Caffey, MD  tamsulosin (FLOMAX) 0.4 MG CAPS capsule Take 0.4 mg by mouth daily.    [provider]  traZODone (DESYREL) 50 MG tablet Take 2 tablets (100 mg total) by mouth at bedtime. 06/19/19   Marcial Pacas, MD  umeclidinium-vilanterol (ANORO ELLIPTA) 62.5-25 MCG/INH AEPB Inhale 1 puff into the lungs daily. 08/01/19   Chesley Mires, MD    Allergies    Iodinated diagnostic agents, Iodine-131, Hydrocodone, Shellfish allergy, Metformin, and Oxycodone  Review of Systems   Review of Systems  Constitutional: Negative for chills and fever.  HENT: Negative for congestion, rhinorrhea and sore throat.   Eyes: Negative for visual disturbance.  Respiratory: Negative for cough and shortness of breath.   Cardiovascular: Negative for chest pain and leg swelling.  Gastrointestinal: Negative for abdominal pain, diarrhea, nausea and vomiting.  Genitourinary: Negative for dysuria.  Musculoskeletal: Negative for back pain and neck pain.  Skin:  Negative for rash.  Neurological: Positive for weakness and headaches. Negative for dizziness and light-headedness.  Hematological: Does not bruise/bleed easily.  Psychiatric/Behavioral: Negative for confusion.    Physical Exam Updated Vital Signs BP (!) 155/91 (BP Location: Right Arm)   Pulse 77   Temp 98.2 F (36.8 C) (Oral)   Resp 16   SpO2 99%   Physical Exam Vitals and nursing note reviewed.  Constitutional:  Appearance: Normal appearance. He is well-developed and well-nourished.  HENT:     Head: Normocephalic.     Comments: Patient with abrasion to the top of his head.  Abrasion measures about 2 cm.  Not actively bleeding. Eyes:     Extraocular Movements: Extraocular movements intact.     Conjunctiva/sclera: Conjunctivae normal.     Pupils: Pupils are equal, round, and reactive to light.  Neck:     Comments: No tenderness to palpation to the posterior cervical spine  Cardiovascular:     Rate and Rhythm: Normal rate and regular rhythm.     Heart sounds: No murmur heard.   Pulmonary:     Effort: Pulmonary effort is normal. No respiratory distress.     Breath sounds: Normal breath sounds.  Abdominal:     Palpations: Abdomen is soft.     Tenderness: There is no abdominal tenderness.  Musculoskeletal:        General: No tenderness or edema.     Cervical back: Normal range of motion. No tenderness.     Comments: Patient with weakness to his left upper extremity and left lower extremity but no pain with range of motion.  No pain with range of motion of the right upper and lower extremity.  Tenderness to palpation to the thoracic or lumbar spine.  Skin:    General: Skin is warm and dry.  Neurological:     Mental Status: He is alert and oriented to person, place, and time.     Cranial Nerves: Cranial nerve deficit present.     Motor: Weakness present.     Comments: Patient with significant weakness to left upper extremity with some contractions of his hand.  Patient  with weakness to left lower extremity but able to raise it off the bed.  No weakness to right upper extremity or right leg.  Patient is deaf but that was in existence prior  Psychiatric:        Mood and Affect: Mood and affect normal.     ED Results / Procedures / Treatments   Labs (all labs ordered are listed, but only abnormal results are displayed) Labs Reviewed  COMPREHENSIVE METABOLIC PANEL - Abnormal; Notable for the following components:      Result Value   Glucose, Bld 121 (*)    All other components within normal limits  CBC WITH DIFFERENTIAL/PLATELET - Abnormal; Notable for the following components:   Platelets 116 (*)    All other components within normal limits  SARS CORONAVIRUS 2 (TAT 6-24 HRS)  LIPASE, BLOOD  PROTIME-INR  BRAIN NATRIURETIC PEPTIDE  URINALYSIS, ROUTINE W REFLEX MICROSCOPIC    EKG None  Radiology DG Chest 2 View  Result Date: 03/12/2020 CLINICAL DATA:  Status post fall. EXAM: CHEST - 2 VIEW COMPARISON:  November 28, 2019 FINDINGS: Stable, mildly decreased lung volumes are seen which is likely secondary to the degree of patient inspiration. There is mild, stable elevation of the right hemidiaphragm. There is no evidence of acute infiltrate, pleural effusion or pneumothorax. The heart size and mediastinal contours are within normal limits. Stable calcified hilar and mediastinal lymph nodes are seen. Bilateral radiopaque pedicle screws are noted within the cervical spine. The visualized skeletal structures are otherwise unremarkable. IMPRESSION: Stable chest x-ray without evidence of acute or active cardiopulmonary disease. Electronically Signed   By: Virgina Norfolk M.D.   On: 03/12/2020 22:37   CT Head Wo Contrast  Result Date: 03/12/2020 CLINICAL DATA:  Head trauma, fall 2 days  ago. History of stroke. Mother says he less mobile after fall. Known continence. EXAM: CT HEAD WITHOUT CONTRAST TECHNIQUE: Contiguous axial images were obtained from the base of  the skull through the vertex without intravenous contrast. COMPARISON:  CT head 03/28/2019 FINDINGS: Brain: Redemonstration of chronic right cerebral encephalomalacia consistent with prior MCA infarction. Redemonstration of left frontal lobe chronic infarction. Patchy and confluent areas of decreased attenuation are noted throughout the deep and periventricular white matter of the cerebral hemispheres bilaterally, compatible with chronic microvascular ischemic disease. No evidence of large-territorial acute infarction. No parenchymal hemorrhage. No mass lesion. No extra-axial collection. No mass effect or midline shift. No hydrocephalus. Basilar cisterns are patent. Vascular: No hyperdense vessel. Atherosclerotic calcifications are present within the cavernous internal carotid arteries. Skull: No acute fracture or focal lesion. Sinuses/Orbits: Paranasal sinuses and mastoid air cells are clear. The orbits are unremarkable. Other: None. IMPRESSION: No acute abnormality in a patient with prior infarctions. Electronically Signed   By: Iven Finn M.D.   On: 03/12/2020 22:59    Procedures Procedures (including critical care time)  Medications Ordered in ED Medications  0.9 %  sodium chloride infusion (has no administration in time range)  0.9 %  sodium chloride infusion (has no administration in time range)    ED Course  I have reviewed the triage vital signs and the nursing notes.  Pertinent labs & imaging results that were available during my care of the patient were reviewed by me and considered in my medical decision making (see chart for details).    MDM Rules/Calculators/A&P                          Initial work-up here labs without significant abnormalities.  No leukocytosis no anemia.  BNP not elevated.  Urinalysis still pending.  CT head without any acute findings.  There is evidence of old strokes.  Chest x-ray without any acute findings.  I have ordered MRI for morning.  I have  ordered social services consult as well as physical therapy consult.  His family will not take him home.  I spoke directly to his brother.  MRI in the morning just in case there is evidence of new stroke.  But his symptoms certainly have been around for about a week so would be more subacute at this time.  EKG is still pending.  Patient did ambulate well for [place] here.  We will have physical therapy do an official assessment.    Final Clinical Impression(s) / ED Diagnoses Final diagnoses:  Multiple falls    Rx / DC Orders ED Discharge Orders    None       Fredia Sorrow, MD 03/13/20 [id number]    Fredia Sorrow, MD 03/13/20 [phone]

## 2020-03-13 NOTE — ED Notes (Signed)
Pt c/o that he doesn't have his glasses, glasses were found on pt's shirt.

## 2020-03-13 NOTE — TOC Initial Note (Signed)
Transition of Care [facility]) - Initial/Assessment Note   Patient Details  Name: Charles [name] MRN: [id number] Date of Birth: Jul 22, 1955  Transition of [facility]) CM/SW Contact:    Sherie Don, LCSW Phone Number: 03/13/2020, 12:39 PM  Clinical Narrative: Patient is a 65 year old male who presented to the ED following a fall. Family is requesting SNF due to falls and increased difficulty with completing ADLs. CSW spoke with patient's brother, Charles [name]. Per brother, the family is having difficulty caring for the patient and feel he needs rehab to get stronger. Brother agreeable to SNF referrals in [place] and [place] counties.   CSW completed FL2. PASRR is pending; requested documents have been uploaded to [facility]. Initial referral faxed out. TOC awaiting bed offers.  Expected Discharge Plan: Skilled Nursing Facility Barriers to Discharge: SNF Pending bed offer  Patient Goals and CMS Choice Patient states their goals for this hospitalization and ongoing recovery are:: Go to rehab to get stronger CMS Medicare.gov Compare Post Acute Care list provided to:: Patient Represenative (must comment) [name] (brother)) Choice offered to / list presented to : [facility] POA / Guardian,Sibling  Expected Discharge Plan and Services Expected Discharge Plan: [place] In-house Referral: Clinical Social Work Discharge Planning Services: NA Post Acute Care Choice: [place] Living arrangements for the past 2 months: Single Family Home              DME Arranged: N/A DME Agency: NA HH Arranged: NA HH Agency: NA  Prior Living Arrangements/Services Living arrangements for the past 2 months: Single Family Home Lives with:: Siblings Patient language and need for interpreter reviewed:: Yes Do you feel safe going back to the place where you live?: Yes      Need for Family Participation in Patient Care: Yes (Comment) Care giver support system in place?: Yes  (comment) Criminal Activity/Legal Involvement Pertinent to Current Situation/Hospitalization: No - Comment as needed  Permission Sought/Granted Permission sought to share information with : Facility Art therapist granted to share information with : Yes, Verbal Permission Granted Permission granted to share info w AGENCY: SNFs  Emotional Assessment Appearance:: Appears stated age Attitude/Demeanor/Rapport: Unable to Assess Affect (typically observed): Unable to Assess Orientation: : Oriented to Self,Oriented to Place,Oriented to  Time Alcohol / Substance Use: Not Applicable Psych Involvement: No (comment)  Admission diagnosis:  fall/possible stroke Patient Active Problem List   Diagnosis Date Noted   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   Cerebrovascular accident (CVA) due to thrombosis of right middle cerebral artery ([place]) 03/18/2019   Chronic nonintractable headache 09/12/2018   Chronic migraine 09/12/2018   Cerebrovascular accident (CVA) ([place]) 03/01/2018   Polypharmacy 03/01/2018   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   Nonintractable headache 06/27/2017   Mixed hyperlipidemia 05/17/2017   Uncontrolled type 2 diabetes mellitus with hyperglycemia ([place]) 05/16/2017   Thrombocytopenia ([place]) 09/13/2016   Hypoglycemia    Syncope 09/12/2016   Seizure disorder as sequela of cerebrovascular accident ([place]) 09/12/2016   MDD (major depressive disorder), recurrent episode, severe ([place]) 12/30/2015   Suicidal ideation    Other pancytopenia ([place]) 12/23/2015   Loss of weight 11/12/2015   Myelopathy, spondylogenic, 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   Iron deficiency anemia 09/22/2015   Spinal stenosis in cervical region 08/21/2015   Neck pain 08/12/2015   Low back pain 08/12/2015   Abnormality of gait 08/12/2015   Neuropathy of right upper extremity 06/29/2015   Acute  rhinitis 05/05/2015   Abdominal pain, epigastric 04/23/2015   Arm pain, right 04/23/2015   Atypical chest pain 07/23/2013   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/2013   Dyspepsia 06/13/2013   Dysphagia 06/13/2013   Headache 11/30/2012   Solitary pulmonary nodule 11/20/2012   Spastic hemiplegia affecting left nondominant side ([place]) 11/08/2012   Essential hypertension, benign 10/12/2012   COPD GOLD II with reversibility  10/12/2012   Deaf mutism, 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

## 2020-03-13 NOTE — ED Notes (Signed)
Pt ambulatory around room with a cane, interpreter used, pt oriented to self, re oriented pt, pt concerned about his pants, explained that he had used the urinal and peed upon them, explained that the gray pants were the pants removed from him, pt took his wallet out of the pants and put it in the pocked of the paper scrub pant he has on.  Dinner tray given

## 2020-03-13 NOTE — NC FL2 (Signed)
[place] LEVEL OF CARE SCREENING TOOL     IDENTIFICATION  Patient Name: Charles [name] Birthdate: Apr 14, 1955 Sex: male Admission Date (Current Location): 03/12/2020  [place] and [place] Number:  [place] [facility] [id number] [place] and Address:  [facility] [address] [place]      Provider Number: [id number]  Attending Physician Name and Address:  Lorelle Gibbs, DO  Relative Name and Phone Number:  Suhaas Agena (brother) Ph: [phone]    Current Level of Care: Hospital Recommended Level of Care: [place] Prior Approval Number:    Date Approved/Denied:   PASRR Number:    Discharge Plan: SNF    Current Diagnoses: Patient Active Problem List   Diagnosis Date Noted  . Coagulation defect ([place]) 07/09/2019  . Cerebrovascular accident (CVA) due to thrombosis of right middle cerebral artery ([place]) 03/18/2019  . Chronic nonintractable headache 09/12/2018  . Chronic migraine 09/12/2018  . Cerebrovascular accident (CVA) ([place]) 03/01/2018  . Polypharmacy 03/01/2018  . 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 Nonintractable headache 06/27/2017  . Mixed hyperlipidemia 05/17/2017  . Uncontrolled type 2 diabetes mellitus with hyperglycemia ([place]) 05/16/2017  . Thrombocytopenia ([place]) 09/13/2016  . Hypoglycemia   . Syncope 09/12/2016  . Seizure disorder as sequela of cerebrovascular accident ([place]) 09/12/2016  . MDD (major depressive disorder), recurrent episode, severe ([place]) 12/30/2015  . Suicidal ideation   . Other pancytopenia ([place]) 12/23/2015  . Loss of weight 11/12/2015  . 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 Iron deficiency anemia 09/22/2015  . Spinal stenosis in cervical region 08/21/2015  . Neck pain 08/12/2015  . Low back pain 08/12/2015  . Abnormality of gait 08/12/2015  . Neuropathy of right upper extremity  06/29/2015  . Acute rhinitis 05/05/2015  . Abdominal pain, epigastric 04/23/2015  . Arm pain, right 04/23/2015  . Atypical chest pain 07/23/2013  . 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 Dyspepsia 06/13/2013  . Dysphagia 06/13/2013  . Headache 11/30/2012  . Solitary pulmonary nodule 11/20/2012  . Spastic hemiplegia affecting left nondominant side ([place]) 11/08/2012  . Essential hypertension, benign 10/12/2012  . COPD GOLD II with reversibility  10/12/2012  . 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 CVA (cerebral vascular accident) ([place]) 10/03/2012  . Diabetes ([place]) 10/03/2012  . Sarcoidosis ([place]) 10/03/2012    Orientation RESPIRATION BLADDER Height & Weight     Self,Time,Situation,Place  Normal Incontinent Weight:   Height:     BEHAVIORAL SYMPTOMS/MOOD NEUROLOGICAL BOWEL NUTRITION STATUS      Continent Diet (Regular)  AMBULATORY STATUS COMMUNICATION OF NEEDS Skin   Limited Assist Non-Verbally (Uses sign language) Normal                       Personal Care Assistance Level of Assistance  Bathing,Feeding,Dressing Bathing Assistance: Limited assistance Feeding assistance: Independent Dressing Assistance: Limited assistance     Functional Limitations Info  Sight,Hearing,Speech Sight Info: Adequate Hearing Info: Impaired (Patient is deaf.) Speech Info: Impaired (Patient is deaf and understands sign language.)    SPECIAL CARE FACTORS FREQUENCY  PT (By licensed PT)     PT Frequency: 5x's/week              Contractures Contractures Info: Not present    Additional Factors Info  Allergies   Allergies Info: Iodinated Diagnostic Agents; Iodine-131; Hydrocodone; Shellfish Allergy; Metformin; Oxycodone           Current Medications (03/13/2020):  This is the current hospital active medication list Current Facility-Administered Medications  Medication Dose Route Frequency Provider Last Rate Last Admin  . 0.9 %  sodium chloride infusion   Intravenous Continuous  Fredia Sorrow, MD   Stopped at 03/13/20 [phone]  . 0.9 %  sodium chloride infusion   Intravenous Continuous Fredia Sorrow, MD 75 mL/hr at 03/13/20 0607 New Bag at 03/13/20 8841  . amLODipine (NORVASC) tablet 5 mg  5 mg Oral Once Horton, Kristie M, DO      . incobotulinumtoxinA ([name]) 100 units injection 500 Units  500 Units Intramuscular Q90 days Marcial Pacas, MD   500 Units at 06/19/19 1459   Current Outpatient Medications  Medication Sig Dispense Refill  . albuterol (PROAIR HFA) 108 (90 Base) MCG/ACT inhaler Inhale 2 puffs into the lungs every 4 (four) hours as needed for wheezing or shortness of breath. 6.7 g 5  . amLODipine (NORVASC) 5 MG tablet Take 5 mg by mouth daily.    [name] atorvastatin (LIPITOR) 10 MG tablet Take 10 mg by mouth daily. (cholesterol)    . atropine 1 % ophthalmic solution INSTILL 1 DROP IN LEFT EYE TWICE DAILY 5 mL 1  . bacitracin-polymyxin b (POLYSPORIN) ophthalmic ointment Place 1 application into the left eye 4 (four) times daily. apply to eye every 12 hours while awake 3.5 g 6  . brimonidine (ALPHAGAN) 0.2 % ophthalmic solution Place 1 drop into the left eye 3 (three) times daily. 10 mL 3  . clopidogrel (PLAVIX) 75 MG tablet Take 75 mg by mouth daily.    . clotrimazole (LOTRIMIN) 1 % cream Apply to both feet and between toes twice daily for 4 weeks. 45 g 1  . dorzolamide-timolol (COSOPT) 22.3-6.8 MG/ML ophthalmic solution INSTILL 1 DROP IN LEFT EYE THREE TIMES DAILY 10 mL 3  . gatifloxacin (ZYMAXID) 0.5 % SOLN Place 1 drop into the left eye 4 (four) times daily.    [name] ibuprofen (ADVIL) 600 MG tablet Take 600 mg by mouth every 6 (six) hours as needed.    . IncobotulinumtoxinA ([name] IM) Inject 500 Units into the muscle every 3 (three) months.    . lamoTRIgine (LAMICTAL) 100 MG tablet Take two tablets twice daily. 360 tablet 4  . losartan (COZAAR) 100 MG tablet Take 100 mg by mouth daily.    [name] omeprazole (PRILOSEC) 20 MG capsule Take 20 mg by mouth daily.    .  prednisoLONE acetate (PRED FORTE) 1 % ophthalmic suspension Place 1 drop into the left eye 4 (four) times daily. 15 mL 0  . tamsulosin (FLOMAX) 0.4 MG CAPS capsule Take 0.4 mg by mouth daily.    . traZODone (DESYREL) 50 MG tablet Take 2 tablets (100 mg total) by mouth at bedtime. 60 tablet 11  . umeclidinium-vilanterol (ANORO ELLIPTA) 62.5-25 MCG/INH AEPB Inhale 1 puff into the lungs daily. 1 each 5     Discharge Medications: Please see discharge summary for a list of discharge medications.  Relevant Imaging Results:  Relevant Lab Results:   Additional Information SSN: [id number]  Sherie Don, LCSW

## 2020-03-13 NOTE — Evaluation (Signed)
Physical Therapy Evaluation Patient Details Name: Charles [name] MRN: [id number] DOB: 1955/11/16 Today's Date: 03/13/2020   History of Present Illness  Charles [name] is a 65 y.o. male.     Patient brought in by family members.  Triage nurse stated patient brought in by brother.  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  We used a signer to communicate with him.  Patient has a history of prior strokes.  Has weakness to his left upper extremity greater than his left lower extremity.  Patient has a history of sarcoidosis.  And type 2 diabetes.  Patient had his stroke in 2014 it was a right MCA stroke.  He has spastic hemiplegia.     Family brought him in because he has fallen multiple times this week and is not able to walk on his own like he used to be able to.  And they are no longer do appropriately be able to care for him.  They are stating he needs to go to rehab.    Clinical Impression  Patient demonstrates slow labored movement for sitting up at bedside, unsteady on feet, at high risk for falls due to frequent stumbling when taking steps due to generalized weakness and fair/poor standing balance using quad-cane.  Patient put back to bed after therapy.  Patient will benefit from continued physical therapy in hospital and recommended venue below to increase strength, balance, endurance for safe ADLs and gait.     Follow Up Recommendations SNF    Equipment Recommendations  None recommended by PT    Recommendations for Other Services       Precautions / Restrictions Precautions Precautions: Fall Restrictions Weight Bearing Restrictions: No      Mobility  Bed Mobility Overal bed mobility: Needs Assistance Bed Mobility: Supine to Sit;Sit to Supine     Supine to sit: Min guard;Min assist Sit to supine: Min guard   General bed mobility comments: slow labored movement    Transfers Overall transfer level: Needs assistance   Transfers: Sit to/from Stand;Stand Pivot  Transfers Sit to Stand: Min assist Stand pivot transfers: Min assist       General transfer comment: slow labored movement  Ambulation/Gait Ambulation/Gait assistance: Min assist;Mod assist Gait Distance (Feet): 18 Feet Assistive device: Quad cane Gait Pattern/deviations: Decreased step length - left;Decreased stance time - left;Decreased stride length;Ataxic Gait velocity: decreased   General Gait Details: slow labored cadence with frequent stumbling, leaning backwards with near fall  Stairs            Wheelchair Mobility    Modified Rankin (Stroke Patients Only)       Balance Overall balance assessment: Needs assistance Sitting-balance support: Feet supported;No upper extremity supported Sitting balance-Leahy Scale: Fair Sitting balance - Comments: fair/good seated at EOB   Standing balance support: During functional activity;Single extremity supported Standing balance-Leahy Scale: Poor Standing balance comment: fair/poor using quad-cane                             Pertinent Vitals/Pain Pain Assessment: No/denies pain    Home Living Family/patient expects to be discharged to:: Private residence Living Arrangements: Other relatives Available Help at Discharge: Family;Available PRN/intermittently Type of Home: House Home Access: Stairs to enter Entrance Stairs-Rails: Right Entrance Stairs-Number of Steps: 1 partial step Home Layout: One level Home Equipment: Wheelchair - manual;Cane - quad;Grab bars - tub/shower;Shower seat      Prior Function Level of Independence: Needs  assistance   Gait / Transfers Assistance Needed: household and short distanced community ambulator using quad-cane  ADL's / Homemaking Assistance Needed: assisted by family        Hand Dominance   Dominant Hand: Right    Extremity/Trunk Assessment   Upper Extremity Assessment Upper Extremity Assessment: RUE deficits/detail;LUE deficits/detail RUE Deficits /  Details: grossly 4/5 RUE Sensation: WNL RUE Coordination: WNL LUE: Unable to fully assess due to immobilization LUE Sensation: decreased light touch;decreased proprioception LUE Coordination: decreased fine motor;decreased gross motor    Lower Extremity Assessment Lower Extremity Assessment: RLE deficits/detail;LLE deficits/detail RLE Deficits / Details: grossly -4/5 RLE Sensation: WNL RLE Coordination: WNL LLE Deficits / Details: grossly 3+/5 LLE Sensation: decreased proprioception LLE Coordination: decreased gross motor;decreased fine motor    Cervical / Trunk Assessment Cervical / Trunk Assessment: Normal  Communication   Communication: Deaf;Interpreter utilized  Cognition Arousal/Alertness: Awake/alert Behavior During Therapy: WFL for tasks assessed/performed Overall Cognitive Status: Within Functional Limits for tasks assessed                                        General Comments      Exercises     Assessment/Plan    PT Assessment Patient needs continued PT services  PT Problem List Decreased strength;Decreased activity tolerance;Decreased balance;Decreased mobility       PT Treatment Interventions DME instruction;Gait training;Stair training;Functional mobility training;Therapeutic activities;Therapeutic exercise;Balance training;Patient/family education    PT Goals (Current goals can be found in the Care Plan section)  Acute Rehab PT Goals Patient Stated Goal: return home PT Goal Formulation: With patient Time For Goal Achievement: 03/27/20 Potential to Achieve Goals: Good    Frequency Min 2X/week   Barriers to discharge        Co-evaluation               AM-PAC PT "6 Clicks" Mobility  Outcome Measure Help needed turning from your back to your side while in a flat bed without using bedrails?: A Little Help needed moving from lying on your back to sitting on the side of a flat bed without using bedrails?: A Little Help needed  moving to and from a bed to a chair (including a wheelchair)?: A Little Help needed standing up from a chair using your arms (e.g., wheelchair or bedside chair)?: A Little Help needed to walk in hospital room?: A Lot Help needed climbing 3-5 steps with a railing? : A Lot 6 Click Score: 16    End of Session   Activity Tolerance: Patient tolerated treatment well;Patient limited by fatigue Patient left: in bed;with call bell/phone within reach Nurse Communication: Mobility status PT Visit Diagnosis: Unsteadiness on feet (R26.81);Other abnormalities of gait and mobility (R26.89);Muscle weakness (generalized) (M62.81);History of falling (Z91.81)    Time: 5366-4403 PT Time Calculation (min) (ACUTE ONLY): 21 min   Charges:   PT Evaluation $PT Eval Moderate Complexity: 1 Mod PT Treatments $Therapeutic Activity: 8-22 mins        12:22 PM, 03/13/20 Lonell Grandchild, MPT Physical Therapist with [facility] [phone] office [phone] mobile phone

## 2020-03-13 NOTE — ED Provider Notes (Signed)
Patient originally presented to the emergency room yesterday.  He was brought in for evaluations of multiple falls.  His imaging has showed no acute injuries.  MRI shows no signs of new stroke.  Physical therapy evaluated the patient and are recommending rehab placement.  Social work has been involved and he is pending placement.  Of note family was contacted and they do not feel they can safely care for the patient anymore.  Patient is deaf and uses sign Ecologist.   Lorelle Gibbs, DO 03/13/20 8546

## 2020-03-13 NOTE — ED Notes (Signed)
Pt in bed, pt requests food, pt has already had his meal tray, apple sauce and crackers given with water.

## 2020-03-13 NOTE — ED Notes (Signed)
Per Aldona Bar Oncologist) who spoke with the admissions team at [facility]. The patient can be sent tonight.

## 2020-03-14 DIAGNOSIS — R32 Unspecified urinary incontinence: Secondary | ICD-10-CM | POA: Diagnosis not present

## 2020-03-14 DIAGNOSIS — G8194 Hemiplegia, unspecified affecting left nondominant side: Secondary | ICD-10-CM | POA: Diagnosis present

## 2020-03-14 DIAGNOSIS — N401 Enlarged prostate with lower urinary tract symptoms: Secondary | ICD-10-CM | POA: Diagnosis not present

## 2020-03-14 DIAGNOSIS — H9190 Unspecified hearing loss, unspecified ear: Secondary | ICD-10-CM | POA: Diagnosis not present

## 2020-03-14 DIAGNOSIS — H913 Deaf nonspeaking, not elsewhere classified: Secondary | ICD-10-CM | POA: Diagnosis present

## 2020-03-14 DIAGNOSIS — S0001XA Abrasion of scalp, initial encounter: Secondary | ICD-10-CM | POA: Diagnosis not present

## 2020-03-14 DIAGNOSIS — E782 Mixed hyperlipidemia: Secondary | ICD-10-CM | POA: Diagnosis present

## 2020-03-14 DIAGNOSIS — G40909 Epilepsy, unspecified, not intractable, without status epilepticus: Secondary | ICD-10-CM | POA: Diagnosis not present

## 2020-03-14 DIAGNOSIS — I63411 Cerebral infarction due to embolism of right middle cerebral artery: Secondary | ICD-10-CM | POA: Diagnosis present

## 2020-03-14 DIAGNOSIS — R3915 Urgency of urination: Secondary | ICD-10-CM | POA: Diagnosis not present

## 2020-03-14 DIAGNOSIS — G4089 Other seizures: Secondary | ICD-10-CM | POA: Diagnosis present

## 2020-03-14 DIAGNOSIS — G4700 Insomnia, unspecified: Secondary | ICD-10-CM | POA: Diagnosis not present

## 2020-03-14 DIAGNOSIS — R2681 Unsteadiness on feet: Secondary | ICD-10-CM | POA: Diagnosis present

## 2020-03-14 DIAGNOSIS — Z20822 Contact with and (suspected) exposure to covid-19: Secondary | ICD-10-CM | POA: Diagnosis not present

## 2020-03-14 DIAGNOSIS — M79609 Pain in unspecified limb: Secondary | ICD-10-CM | POA: Diagnosis not present

## 2020-03-14 DIAGNOSIS — M5440 Lumbago with sciatica, unspecified side: Secondary | ICD-10-CM | POA: Diagnosis not present

## 2020-03-14 DIAGNOSIS — F339 Major depressive disorder, recurrent, unspecified: Secondary | ICD-10-CM | POA: Diagnosis present

## 2020-03-14 DIAGNOSIS — G529 Cranial nerve disorder, unspecified: Secondary | ICD-10-CM | POA: Diagnosis not present

## 2020-03-14 DIAGNOSIS — M47816 Spondylosis without myelopathy or radiculopathy, lumbar region: Secondary | ICD-10-CM | POA: Diagnosis not present

## 2020-03-14 DIAGNOSIS — M62442 Contracture of muscle, left hand: Secondary | ICD-10-CM | POA: Diagnosis present

## 2020-03-14 DIAGNOSIS — I10 Essential (primary) hypertension: Secondary | ICD-10-CM | POA: Diagnosis present

## 2020-03-14 DIAGNOSIS — E1165 Type 2 diabetes mellitus with hyperglycemia: Secondary | ICD-10-CM | POA: Diagnosis present

## 2020-03-14 DIAGNOSIS — M6281 Muscle weakness (generalized): Secondary | ICD-10-CM | POA: Diagnosis present

## 2020-03-14 DIAGNOSIS — K219 Gastro-esophageal reflux disease without esophagitis: Secondary | ICD-10-CM | POA: Diagnosis not present

## 2020-03-14 DIAGNOSIS — R296 Repeated falls: Secondary | ICD-10-CM | POA: Diagnosis present

## 2020-03-14 DIAGNOSIS — R531 Weakness: Secondary | ICD-10-CM | POA: Diagnosis not present

## 2020-03-14 DIAGNOSIS — D869 Sarcoidosis, unspecified: Secondary | ICD-10-CM | POA: Diagnosis present

## 2020-03-14 DIAGNOSIS — E785 Hyperlipidemia, unspecified: Secondary | ICD-10-CM | POA: Diagnosis not present

## 2020-03-14 DIAGNOSIS — D649 Anemia, unspecified: Secondary | ICD-10-CM | POA: Diagnosis not present

## 2020-03-14 DIAGNOSIS — H5440 Blindness, one eye, unspecified eye: Secondary | ICD-10-CM | POA: Diagnosis not present

## 2020-03-14 DIAGNOSIS — J449 Chronic obstructive pulmonary disease, unspecified: Secondary | ICD-10-CM | POA: Diagnosis not present

## 2020-03-14 DIAGNOSIS — B351 Tinea unguium: Secondary | ICD-10-CM | POA: Diagnosis not present

## 2020-03-14 DIAGNOSIS — R5381 Other malaise: Secondary | ICD-10-CM | POA: Diagnosis not present

## 2020-03-14 LAB — CBG MONITORING, ED: Glucose-Capillary: 163 mg/dL — ABNORMAL HIGH (ref 70–99)

## 2020-03-14 NOTE — ED Notes (Signed)
[facility] called and transportation for patient should arrive at 11:45 today.

## 2020-03-14 NOTE — ED Notes (Signed)
This NT went into room to obtain vital signs. Upon entering room, this NT noticed pt had defecated in the floor. Floor and trash in room cleaned up.

## 2020-03-14 NOTE — ED Notes (Signed)
Spoke with staff at [facility] to give report; informed them pt is on the way

## 2020-03-14 NOTE — ED Notes (Signed)
Charles [name] ride cancelled due to weather. [facility] aware and other accommodations for transport to be made.

## 2020-03-14 NOTE — ED Notes (Signed)
Spoke with patient's legal guardian/POA, Bettie Swavely and updated him on patient's status to be transported to [facility] in [place] understanding with approval given.

## 2020-03-14 NOTE — ED Notes (Signed)
[facility] in [place] multiple times with multiple numbers that were given, no answer. Bria, Education officer, museum, contacted. Patient was accepted yesterday and nursing home should be expecting him. Approval given to send patient via uber when it arrives. Paper work ready and IV removed. Melburn Popper estimated to arrive at 10:24.

## 2020-03-17 DIAGNOSIS — H5440 Blindness, one eye, unspecified eye: Secondary | ICD-10-CM | POA: Diagnosis not present

## 2020-03-17 DIAGNOSIS — R5381 Other malaise: Secondary | ICD-10-CM | POA: Diagnosis not present

## 2020-03-17 DIAGNOSIS — H9190 Unspecified hearing loss, unspecified ear: Secondary | ICD-10-CM | POA: Diagnosis not present

## 2020-03-17 DIAGNOSIS — D649 Anemia, unspecified: Secondary | ICD-10-CM | POA: Diagnosis not present

## 2020-03-17 DIAGNOSIS — G40909 Epilepsy, unspecified, not intractable, without status epilepticus: Secondary | ICD-10-CM | POA: Diagnosis not present

## 2020-03-17 DIAGNOSIS — G4700 Insomnia, unspecified: Secondary | ICD-10-CM | POA: Diagnosis not present

## 2020-03-17 DIAGNOSIS — R32 Unspecified urinary incontinence: Secondary | ICD-10-CM | POA: Diagnosis not present

## 2020-03-17 DIAGNOSIS — R296 Repeated falls: Secondary | ICD-10-CM | POA: Diagnosis not present

## 2020-03-19 DIAGNOSIS — E785 Hyperlipidemia, unspecified: Secondary | ICD-10-CM | POA: Diagnosis not present

## 2020-03-19 DIAGNOSIS — J449 Chronic obstructive pulmonary disease, unspecified: Secondary | ICD-10-CM | POA: Diagnosis not present

## 2020-03-19 DIAGNOSIS — R296 Repeated falls: Secondary | ICD-10-CM | POA: Diagnosis not present

## 2020-03-19 DIAGNOSIS — I10 Essential (primary) hypertension: Secondary | ICD-10-CM | POA: Diagnosis not present

## 2020-03-19 DIAGNOSIS — R5381 Other malaise: Secondary | ICD-10-CM | POA: Diagnosis not present

## 2020-03-19 DIAGNOSIS — R32 Unspecified urinary incontinence: Secondary | ICD-10-CM | POA: Diagnosis not present

## 2020-03-19 DIAGNOSIS — G4700 Insomnia, unspecified: Secondary | ICD-10-CM | POA: Diagnosis not present

## 2020-03-19 DIAGNOSIS — K219 Gastro-esophageal reflux disease without esophagitis: Secondary | ICD-10-CM | POA: Diagnosis not present

## 2020-03-25 DIAGNOSIS — R3915 Urgency of urination: Secondary | ICD-10-CM | POA: Diagnosis not present

## 2020-03-25 DIAGNOSIS — N401 Enlarged prostate with lower urinary tract symptoms: Secondary | ICD-10-CM | POA: Diagnosis not present

## 2020-03-31 DIAGNOSIS — I10 Essential (primary) hypertension: Secondary | ICD-10-CM | POA: Diagnosis not present

## 2020-03-31 DIAGNOSIS — K219 Gastro-esophageal reflux disease without esophagitis: Secondary | ICD-10-CM | POA: Diagnosis not present

## 2020-03-31 DIAGNOSIS — G4700 Insomnia, unspecified: Secondary | ICD-10-CM | POA: Diagnosis not present

## 2020-03-31 DIAGNOSIS — R32 Unspecified urinary incontinence: Secondary | ICD-10-CM | POA: Diagnosis not present

## 2020-03-31 DIAGNOSIS — R5381 Other malaise: Secondary | ICD-10-CM | POA: Diagnosis not present

## 2020-03-31 DIAGNOSIS — R296 Repeated falls: Secondary | ICD-10-CM | POA: Diagnosis not present

## 2020-03-31 DIAGNOSIS — J449 Chronic obstructive pulmonary disease, unspecified: Secondary | ICD-10-CM | POA: Diagnosis not present

## 2020-03-31 DIAGNOSIS — H9190 Unspecified hearing loss, unspecified ear: Secondary | ICD-10-CM | POA: Diagnosis not present

## 2020-03-31 DIAGNOSIS — E785 Hyperlipidemia, unspecified: Secondary | ICD-10-CM | POA: Diagnosis not present

## 2020-04-01 ENCOUNTER — Telehealth: Payer: Self-pay | Admitting: Neurology

## 2020-04-01 NOTE — Telephone Encounter (Signed)
Pt.'s brother Charles [name] is on [place]. He states that he thinks his brother needs the shots again. Please advise as far as scheduling an appt. or a referral being done first.

## 2020-04-01 NOTE — Telephone Encounter (Signed)
Per vo by Dr. Krista Blue, okay to restart [name] 500 units. I called his brother, [name] (on [place]).  Left a message letting him know we will start working with the patient's insurance for authorization. Lovena Le will call to schedule, once approved.

## 2020-04-06 ENCOUNTER — Ambulatory Visit (INDEPENDENT_AMBULATORY_CARE_PROVIDER_SITE_OTHER): Payer: Medicare Other | Admitting: Podiatry

## 2020-04-06 ENCOUNTER — Encounter: Payer: Self-pay | Admitting: Podiatry

## 2020-04-06 ENCOUNTER — Other Ambulatory Visit: Payer: Self-pay

## 2020-04-06 DIAGNOSIS — D689 Coagulation defect, unspecified: Secondary | ICD-10-CM

## 2020-04-06 DIAGNOSIS — M79609 Pain in unspecified limb: Secondary | ICD-10-CM

## 2020-04-06 DIAGNOSIS — E119 Type 2 diabetes mellitus without complications: Secondary | ICD-10-CM

## 2020-04-06 DIAGNOSIS — B351 Tinea unguium: Secondary | ICD-10-CM | POA: Diagnosis not present

## 2020-04-12 NOTE — Progress Notes (Signed)
  Subjective:  Patient ID: Charles [name], male    DOB: Jun 27, 1955,  MRN: [id number]  65 y.o. male presents with preventative diabetic foot care and painful thick toenails that are difficult to trim. Pain interferes with ambulation. Aggravating factors include wearing enclosed shoe gear. Pain is relieved with periodic professional debridement..    Patient now resides at [facility]. He is deaf and uses sign language. He is accompanied by sign language interpreter on today's visit.  He relates no new pedal problems on today's visit.  PCP is Dr. [name].   Review of Systems: Negative except as noted in the HPI.   Allergies  Allergen Reactions  . Iodinated Diagnostic Agents Itching and Anaphylaxis  . Iodine-131 Itching and Swelling  . Shellfish-Derived Products Itching  . Codeine Itching    Other reaction(s): Unknown  . Hydrocodone Other (See Comments)    Makes "looney" Other reaction(s): Other Makes "looney"  . Oxycodone Nausea And Vomiting and Itching  . Shellfish Allergy Hives and Swelling  . Metformin Diarrhea    Objective:  There were no vitals filed for this visit. Constitutional Patient is a pleasant 65 y.o. African American male in NAD. AAO x 3.  Vascular Capillary refill time to digits immediate b/l. Palpable pedal pulses b/l LE. Pedal hair absent. Lower extremity skin temperature gradient within normal limits. No cyanosis or clubbing noted.  Neurologic  Protective sensation intact 5/5 intact bilaterally with 10g monofilament b/l.  Dermatologic Pedal skin with normal turgor, texture and tone bilaterally. No open wounds bilaterally. No interdigital macerations bilaterally. Toenails 1-5 b/l elongated, discolored, dystrophic, thickened, crumbly with subungual debris and tenderness to dorsal palpation. Diffuse scaling noted peripherally and plantarly b/l feet with mild foot odor.  No interdigital macerations.  No blisters, no weeping. No signs of  secondary bacterial infection noted.  Orthopedic: Muscle strength 5/5 to all LE muscle groups of right lower extremity. Dropfoot left lower extremity. No pain crepitus or joint limitation noted with ROM b/l. No gross bony deformities bilaterally.   Hemoglobin A1C Latest Ref Rng & Units 09/12/2019  HGBA1C 4.8 - 5.6 % 7.1(H)  Some recent data might be hidden   Assessment:   1. Pain due to onychomycosis of nail   2. Coagulation defect ([place])   3. Diabetes mellitus without complication ([place])    Plan:  Patient was evaluated and treated and all questions answered.  Onychomycosis with pain -Nails palliatively debridement as below. -Educated on self-care  Procedure: Nail Debridement Rationale: Pain Type of Debridement: manual, sharp debridement. Instrumentation: Nail nipper, rotary burr. Number of Nails: 10  -Examined patient. -Continue diabetic foot care principles. -Patient to continue soft, supportive shoe gear daily. -Toenails 1-5 b/l were debrided in length and girth with sterile nail nippers and dremel without iatrogenic bleeding. -Patient to report any pedal injuries to medical professional immediately. -For tinea pedis, Rx sent to pharmacy for Clotrimazole Cream 1% to be applied to feet twice daily for 4 weeks. -Patient/POA to call should there be question/concern in the interim.  Return in about 3 months (around 07/04/2020).  Marzetta Board, DPM

## 2020-04-13 NOTE — Telephone Encounter (Signed)
Patient has Medicare as primary and will not need PA for [name]. I attempted to call patient's brother, [name], but was not able to reach him or LVM. I will try again later this afternoon or tomorrow.

## 2020-04-14 DIAGNOSIS — M5440 Lumbago with sciatica, unspecified side: Secondary | ICD-10-CM | POA: Diagnosis not present

## 2020-04-14 DIAGNOSIS — I10 Essential (primary) hypertension: Secondary | ICD-10-CM | POA: Diagnosis not present

## 2020-04-16 DIAGNOSIS — J449 Chronic obstructive pulmonary disease, unspecified: Secondary | ICD-10-CM | POA: Diagnosis not present

## 2020-04-16 DIAGNOSIS — G4700 Insomnia, unspecified: Secondary | ICD-10-CM | POA: Diagnosis not present

## 2020-04-16 DIAGNOSIS — H5440 Blindness, one eye, unspecified eye: Secondary | ICD-10-CM | POA: Diagnosis not present

## 2020-04-16 DIAGNOSIS — K219 Gastro-esophageal reflux disease without esophagitis: Secondary | ICD-10-CM | POA: Diagnosis not present

## 2020-04-16 DIAGNOSIS — R5381 Other malaise: Secondary | ICD-10-CM | POA: Diagnosis not present

## 2020-04-16 DIAGNOSIS — R296 Repeated falls: Secondary | ICD-10-CM | POA: Diagnosis not present

## 2020-04-16 DIAGNOSIS — E785 Hyperlipidemia, unspecified: Secondary | ICD-10-CM | POA: Diagnosis not present

## 2020-04-16 NOTE — Telephone Encounter (Signed)
[name] returned my call, he accepted 3/30 at 1:00. Put appointment card in the mail for [facility] [place] per [facility] request.

## 2020-04-16 NOTE — Telephone Encounter (Signed)
I attempted to call [name] again today, did not reach him but was able to LVM this time. Requested he return my call and let me know if 3/30 at 1:00 will work for them for patient's [name] appt.

## 2020-05-07 DIAGNOSIS — M47816 Spondylosis without myelopathy or radiculopathy, lumbar region: Secondary | ICD-10-CM | POA: Diagnosis not present

## 2020-05-08 DIAGNOSIS — J449 Chronic obstructive pulmonary disease, unspecified: Secondary | ICD-10-CM | POA: Diagnosis not present

## 2020-05-08 DIAGNOSIS — G4700 Insomnia, unspecified: Secondary | ICD-10-CM | POA: Diagnosis not present

## 2020-05-08 DIAGNOSIS — E785 Hyperlipidemia, unspecified: Secondary | ICD-10-CM | POA: Diagnosis not present

## 2020-05-08 DIAGNOSIS — K219 Gastro-esophageal reflux disease without esophagitis: Secondary | ICD-10-CM | POA: Diagnosis not present

## 2020-05-08 DIAGNOSIS — I10 Essential (primary) hypertension: Secondary | ICD-10-CM | POA: Diagnosis not present

## 2020-05-08 DIAGNOSIS — R296 Repeated falls: Secondary | ICD-10-CM | POA: Diagnosis not present

## 2020-05-08 DIAGNOSIS — G40909 Epilepsy, unspecified, not intractable, without status epilepticus: Secondary | ICD-10-CM | POA: Diagnosis not present

## 2020-05-08 DIAGNOSIS — R5381 Other malaise: Secondary | ICD-10-CM | POA: Diagnosis not present

## 2020-05-08 DIAGNOSIS — R32 Unspecified urinary incontinence: Secondary | ICD-10-CM | POA: Diagnosis not present

## 2020-05-15 DIAGNOSIS — R030 Elevated blood-pressure reading, without diagnosis of hypertension: Secondary | ICD-10-CM | POA: Diagnosis not present

## 2020-05-15 DIAGNOSIS — I639 Cerebral infarction, unspecified: Secondary | ICD-10-CM | POA: Diagnosis not present

## 2020-05-15 DIAGNOSIS — I10 Essential (primary) hypertension: Secondary | ICD-10-CM | POA: Diagnosis not present

## 2020-05-15 DIAGNOSIS — G8194 Hemiplegia, unspecified affecting left nondominant side: Secondary | ICD-10-CM | POA: Diagnosis not present

## 2020-05-15 DIAGNOSIS — Z6827 Body mass index (BMI) 27.0-27.9, adult: Secondary | ICD-10-CM | POA: Diagnosis not present

## 2020-05-15 DIAGNOSIS — Z299 Encounter for prophylactic measures, unspecified: Secondary | ICD-10-CM | POA: Diagnosis not present

## 2020-05-15 DIAGNOSIS — Z789 Other specified health status: Secondary | ICD-10-CM | POA: Diagnosis not present

## 2020-05-15 DIAGNOSIS — E1165 Type 2 diabetes mellitus with hyperglycemia: Secondary | ICD-10-CM | POA: Diagnosis not present

## 2020-05-20 ENCOUNTER — Ambulatory Visit (INDEPENDENT_AMBULATORY_CARE_PROVIDER_SITE_OTHER): Payer: Medicare Other | Admitting: Neurology

## 2020-05-20 ENCOUNTER — Encounter: Payer: Self-pay | Admitting: Neurology

## 2020-05-20 ENCOUNTER — Other Ambulatory Visit: Payer: Self-pay

## 2020-05-20 VITALS — BP 124/76 | HR 84

## 2020-05-20 DIAGNOSIS — G8114 Spastic hemiplegia affecting left nondominant side: Secondary | ICD-10-CM

## 2020-05-20 DIAGNOSIS — I63311 Cerebral infarction due to thrombosis of right middle cerebral artery: Secondary | ICD-10-CM

## 2020-05-20 MED ORDER — TRAZODONE HCL 50 MG PO TABS: 100.0000 mg | ORAL_TABLET | Freq: Every day | ORAL | 11 refills | Status: DC

## 2020-05-20 NOTE — Progress Notes (Signed)
**  [name] 100 units x 5 vials, NDC 0259-1610-01, Lot [id number], Exp 01/2022, office supply.//mck,rn**

## 2020-05-20 NOTE — Progress Notes (Signed)
Chief Complaint  Patient presents with  . Procedure    Left spastic hemilegia      PATIENT: Charles [name] DOB: June 17, 1955  HISTORICAL  Charles [name] is a 65 years old right-handed male, accompanied by his mother, and his brother, interpreter, for EMG guided Botox injection for his spastic left hemiparesis,I saw him first in Nov 2015, he was previously patients of Dr. Leonie Man, and Dr. Janann Colonel, who has performed EMG guided Botox injection in January, April, July 2015, for his spastic left hemiparesis, which he responded very well  He was born deaf, He had a history of pulmonary sarcoidosis, was treated with long-term low-dose steroid, also with past medical history of hypertension, diabetes, hyperlipidemia,  He suffered stroke in April 2014, was treated at [place], with residual severe left hemiparesis, per record  MRI of the brain showed remote right hemispheric infarct and small vessel disease type changes  MRA of brain abrupt  cutoff of flow at the right carotid terminus with nonvisualization of right middle cerebral artery branch vessels consistent with the patient's remote right hemispheric infarct. Fetal type origin of the posterior cerebral arteries with posterior cerebral artery supplied predominately from the anterior circulation. 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   2D Echocardiogram 60%, wall motion normal, LA normal size Carotid Doppler Bilateral: 1-39% ICA stenosis. Vertebral artery flow is antegrade  TEE no PFO, Pulm AVM  EEG in August 2014, normal  awake and asleep EEG. No focal or generalized epileptiform discharges noted   UPDATE May 8th 2017: He is with his mother and brother at today's clinical visit, last visit was November 25th 2015 for EMG guided Botox injection to his spastic left upper and lower extremity, he denies significant improvement with Botox injection.  Today he came in with a new issue, complains a year history  of intermittent right elbow discomfort, radiating pain to right arm, right hand, mainly involving right fifths, and fourth fingers, it happened with prolonged sitting, eating, or sleeping, he also noticed mild right hand weakness, difficulty to unscrew the bottle top. 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 He complains progressive worsening gait abnormality, falling few times, neck pain, low back pain, radiating pain to his right leg  UPDATE August 24 2015: He is accompanied by his mother brother and interpreter at today's clinical visit, he continue complains of low back pain radiating pain to right lower extremity, intermittent right arm hands paresthesia, worsening gait difficulty, urinary urgency,  We have personally reviewed MRI of cervical spine in June 2017: There is severe spinal stenosis at C3-C4 and at C4-C5 due to central disc protrusions/herniations, uncovertebral spurring, facet hypertrophy and congenitally short pedicles. There is subtle hyperintense signal within the spinal cord on sagittal STIR images just below the C3-C4 interspace but this is not confirmed on axial images. This could represent a mild cervical myelopathy. 2. There is moderate spinal stenosis at C5-C6 and C6-C7 mild spinal stenosis at C2-C3 to degenerative changes and congenitally short pedicles. 3. At C3-C4 there is moderately severe bilateral foraminal narrowing at could lead to impingement either of the C4 nerve roots. There does not appear to be nerve root impingement at the other cervical levels  MRI  of lumbar spine in June 2017: At L4-L5, there is broad disc bulging, moderately severe facet hypertrophy, right greater than left,  and minimal anterolisthesis. There is no nerve root compression at this level. When compared to the MRI dated 12/24/2012, the synovial cyst that was noted to the left on the prior MRI is no longer present. This relieves the left L5 nerve root impingement that was noted at that time. However, since the prior MRI there has been mild progression of the facet hypertrophy and minimal anterolisthesis . 2. There are milder degenerative changes at L3-L4 and L5-S1 that did not lead to any nerve root impingement, unchanged when compared to the previous MRI.  UPDATE Dec 03 2015: He had cervical decompression laminectomy C3-4-5 6 with foraminotomies of the C4-5 VI nerve roots by Dr. [name] on October 07 2015, patient reported significant improvement in his neck pain, family noticed he has difficulty sleeping, agitated.  I personally reviewed MRI cervical spine in September 2017: Posterior hardware fusion C3 through C7. Posterior decompression C3 through C5. Small posterior fluid collection in the soft tissues may represent postop fluid versus CSF leak. Small area of cord hyperintensity at the C4 level is unchanged compatible with chronic myelomalacia. No cord compression.  He complains of low back pain,  MRI of left femur there was no significant abnormality notice, MRI lumbar in June 2017, multilevel degenerative changes, but there was no significant canal or foraminal stenosis   UPDATE Jan 16th 2018: He complains of neck pain, using heating pad, stiff, limited range of motion, lean back to get relief, difficulty to get a comfortable position to sleep, he lost drip of his right hand,  Left leg pain when bearing weight, left leg tightness,   CAT scan of the brain in August 2017 showed large right MCA stroke  Update Jul 05 2016: He complains of worsening left-sided low back pain, unsteady gait, we have personally reviewed MRI of lumbar in June 2017, only mild degenerative changes, there is no significant canal  or foraminal stenosis.  He is taking Mobic/naproxen as needed, worsening spastic left hemiparesis, left hands in position   UPDATE June 5th 2018: He returned for a electronic stimulation guided xeomin injection for spastic left hemiparesis, was emphasize on left upper extremity today,  UPDATE Feb 08 2017: He did respond some to previous xeomin injection 500 units, still has significant left arm spasticity, weakness, left anterior thigh muscle spasm in the achy pain  UPDATE Jun 27 2017: He presented to the emergency room on April 22, 2017, complains of the headaches, apparently he was given cocktail of Compazine, Benadryl, Decadron,  CT head without contrast showed chronic right MCA stroke, no acute abnormality  Laboratory evaluation showed normal TSH, free T4, lipid panel, A1c was mildly elevated 6.0, MRI of left hip on April 26, 2017 showed mild marginal osteophytes on the femoral head, stable since 2017,  He was given right greater occipital nerve perineural steroid injection on June 19, 2017 by Dr. Rolla [name], his headache overall has much improved,  UPDATE August 30 2017: He now lives at nursing home, there is no recurrent seizure, he is not taking lower dose of lamotrigine 100 mg daily, responding well to previous EMG guided xeomin injection.  UPDATE Nov 29 2017: He was not sure about the benefit from previous injection, no recurrent seizure, was noted to have left anterior shin area swelling,  UPDATE Jan 9th 2020: He presented to emergency room on February 25, 2018 for increased aggressive  behavior at nursing home noncompliant with his medications, instead of continual lamotrigine, he was given a new list of uncollected medications Keppra 500 mg twice daily, which was known to cause increased agitation for him in the past,  We went over his polypharmacy in detail, will change him back to lamotrigine, also check the lamotrigine level for compliance, received 500 units of [name] injection  for spastic left upper extremity today  He was noted to have worsening confusion, we personally reviewed CT scan in September 2019: No acute abnormality, large right MCA stroke encephalomalacia, extensive periventricular small vessel disease  UPDATE September 12 2018: He is doing well, has no recurrent seizure, but complains of frequent headaches, over-the-counter medicine does not help, lateralized mild to moderate headache, with light noise sensitivity, but made it worse, resting was helpful, lasting half day to whole day  he is discharged from nursing home to be with his brother because of his behavior issues, he was on Keppra and lamotrigine, Keppra caused emotional outburst, will stop the Keppra, change to lamotrigine 100 mg 2 tablets twice a day,  Laboratory evaluation in January 2020, lamotrigine was not detected, Keppra was 17.9, normal B12, CMP showed elevated glucose 165, calcium of 8.5, negative troponin  UPDATE Dec 03 2018: He responded well to previous injection, there was no recurrent seizure,  Update March 18, 2019: I reviewed his ophthalmology evaluation on February 12, 2019: Elevated inferotemporal mass OS was resolved heme, 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) the decision was to continue monitoring,  He now lives with his brother at home, no recurrent seizure, taking lamotrigine 100 mg 2 tablets twice a day, continue have occasional emotional outbursts, no longer taking Keppra,  UPDATE June 19 2019: He lives with his brother Charles [name], who reported that patient has increased memory loss, confusion, angry control issues, he misplace things, often get frustrated and angry towards people, he has sleep difficulty, despite trazodone 50 mg every night, he also complains of worsening depression,  He is on polypharmacy treatment, taking lamotrigine 200 mg twice a day, there was no recurrent seizure, on multiple eyedrop frequent [facility]  eye clinic visit for his left eye issues, 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 Iodinated Diagnostic Agents Itching and Anaphylaxis  . Iodine-131 Itching and Swelling  . Shellfish-Derived Products Itching  . Codeine Itching    Other reaction(s): Unknown  . Hydrocodone Other (See Comments)    Makes "looney" Other reaction(s): Other Makes "looney"  . Oxycodone Nausea And Vomiting and Itching  . Shellfish Allergy Hives and Swelling  . Metformin Diarrhea    HOME MEDICATIONS: Current Outpatient Medications on File Prior to Visit  Medication Sig Dispense Refill  . ADVAIR HFA 230-21 MCG/ACT inhaler SMARTSIG:2 Puff(s) By Mouth Twice Daily    . albuterol (PROAIR HFA) 108 (90 Base) MCG/ACT inhaler Inhale 2 puffs into the lungs every 4 (four) hours as needed for wheezing or shortness of breath. 6.7 g 5  . amLODipine (NORVASC) 5 MG tablet Take 5 mg by mouth daily.    [name]  ascorbic acid (VITAMIN C) 500 MG tablet Take by mouth.    [name] atorvastatin (LIPITOR) 10 MG tablet Take 10 mg by mouth daily. (cholesterol)    . atorvastatin (LIPITOR) 20 MG tablet Take 20 mg by mouth at bedtime.    [name] atropine 1 % ophthalmic solution INSTILL 1 DROP IN LEFT EYE TWICE DAILY 5 mL 1  . bacitracin-polymyxin b (POLYSPORIN) ophthalmic ointment Place 1 application into the left eye 4 (four) times daily. apply to eye every 12 hours while awake 3.5 g 6  . brimonidine (ALPHAGAN) 0.2 % ophthalmic solution Place 1 drop into the left eye 3 (three)  times daily. 10 mL 3  . Cholecalciferol 25 MCG (1000 UT) tablet Take by mouth.    . clopidogrel (PLAVIX) 75 MG tablet Take 75 mg by mouth daily.    . clotrimazole (LOTRIMIN) 1 % cream Apply to both feet and between toes twice daily for 4 weeks. 45 g 1  . dorzolamide-timolol (COSOPT) 22.3-6.8 MG/ML ophthalmic solution INSTILL 1 DROP IN LEFT EYE THREE TIMES DAILY 10 mL 3  . doxycycline (VIBRAMYCIN) 100 MG capsule Take 100 mg by mouth 2 (two) times daily.    [name] escitalopram (LEXAPRO) 20 MG tablet Take by mouth.    . escitalopram (LEXAPRO) 20 MG tablet Take 20 mg by mouth daily.    . furosemide (LASIX) 20 MG tablet Take 20 mg by mouth daily.    . furosemide (LASIX) 20 MG tablet Take by mouth.    . gatifloxacin (ZYMAXID) 0.5 % SOLN Place 1 drop into the left eye 4 (four) times daily.    [name] ibuprofen (ADVIL) 600 MG tablet Take 600 mg by mouth every 6 (six) hours as needed.    . IncobotulinumtoxinA ([name] IM) Inject 500 Units into the muscle every 3 (three) months.    [name] JARDIANCE 10 MG TABS tablet Take 10 mg by mouth daily.    [name] lamoTRIgine (LAMICTAL) 100 MG tablet Take two tablets twice daily. 360 tablet 4  . levofloxacin (LEVAQUIN) 750 MG tablet Take 750 mg by mouth daily.    [name] losartan (COZAAR) 100 MG tablet Take 100 mg by mouth daily.    . metFORMIN (GLUCOPHAGE) 500 MG tablet Take by mouth.    . metFORMIN (GLUCOPHAGE) 500 MG tablet Take 500 mg by mouth 2 (two) times daily.    [name] omeprazole (PRILOSEC) 20 MG capsule Take 20 mg by mouth daily.    . prednisoLONE acetate (PRED FORTE) 1 % ophthalmic suspension Place 1 drop into the left eye 4 (four) times daily. 15 mL 0  . predniSONE (DELTASONE) 20 MG tablet Take by mouth.    . predniSONE (DELTASONE) 20 MG tablet 3 po daily for 2 days, then 2 po daily for 2 days, then 1 po daily for 2 days    . risperiDONE (RISPERDAL) 0.5 MG tablet Take by mouth.    . risperiDONE (RISPERDAL) 0.5 MG tablet Take 0.5 mg by mouth at bedtime.    . tamsulosin (FLOMAX) 0.4 MG  CAPS capsule Take 0.4 mg by mouth daily.    . traZODone (DESYREL) 50 MG tablet Take 2 tablets (100 mg total) by mouth at bedtime. 60 tablet 11  . umeclidinium-vilanterol (ANORO ELLIPTA) 62.5-25 MCG/INH AEPB Inhale 1 puff into the lungs daily. 1 each 5  . zinc sulfate 220 (50 Zn) MG capsule Take by mouth.     Current Facility-Administered Medications on File Prior to Visit  Medication Dose Route Frequency Provider Last Rate Last Admin  .  incobotulinumtoxinA ([name]) 100 units injection 500 Units  500 Units Intramuscular Q90 days Marcial Pacas, MD   500 Units at 06/19/19 1459    PAST MEDICAL HISTORY: Past Medical History:  Diagnosis Date  . Anemia   . Anxiety   . Asthma   . Deaf   . Depression   . Essential hypertension   . GERD (gastroesophageal reflux disease)   . History of migraine   . History of stroke    Spastic hemiplegia, right MCA stroke April 2014 in [place]  . Mixed hyperlipidemia   . PAD (peripheral artery disease) ([place])   . Sarcoidosis   . Seizures ([place])    Last one over 2 yrs ago in 2018, controlled with med  . Shingles   . Type 2 diabetes mellitus ([place])     PAST SURGICAL HISTORY: Past Surgical History:  Procedure Laterality Date  . AIR/FLUID EXCHANGE Left 10/04/2018   Procedure: Air/Fluid Exchange;  Surgeon: Bernarda Caffey, MD;  Location: [facility];  Service: Ophthalmology;  Laterality: Left;  . BRAVO PH STUDY N/A 06/18/2013   pH STUDY SHOWS NEXIUM TWICE DAILY CONTROLS THE ACID IN HIS STOMACH. HE HAD VERY FEWEPISODE OF REGURGITATION RECORDED IN THE 2 DAYS THE STUDY WAS PERFORMED.   [name] COLONOSCOPY  2011   IN [place]  . 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   .  ESOPHAGOGASTRODUODENOSCOPY N/A 05/05/2015   Procedure: ESOPHAGOGASTRODUODENOSCOPY (EGD);  Surgeon: Danie Binder, MD;  [facility];  Service: Endoscopy;  Laterality: N/A;  730  . INJECTION OF SILICONE OIL Left 3/47/4259   Procedure: Injection Of Silicone Oil;  Surgeon: Bernarda Caffey, MD;  Location: [facility];  Service: Ophthalmology;  Laterality: Left;  . LASER PHOTO ABLATION Left 10/04/2018   Procedure: Laser Photo Ablation;  Surgeon: Bernarda Caffey, MD;  Location: [facility];  Service: Ophthalmology;  Laterality: Left;  [name] MALONEY DILATION N/A 06/18/2013   Procedure: MALONEY DILATION;  Surgeon: Danie Binder, MD;  [facility];  Service: Endoscopy;  Laterality: N/A;  . OTHER SURGICAL HISTORY     cyst removal head and chest  . PARS PLANA VITRECTOMY Left 10/04/2018   Procedure: PARS PLANA VITRECTOMY WITH 25 GAUGE;  Surgeon: Bernarda Caffey, MD;  Location: [facility];  Service: Ophthalmology;  Laterality: Left;  . POSTERIOR CERVICAL FUSION/FORAMINOTOMY N/A 10/07/2015   Procedure: Cervical Three-Four, Cervical Four-Five, Cervical Five-Six, Cervical Six-Seven Posterior Cervical Laminectomy and Fusion;  Surgeon: Kary Kos, MD;  Location: [facility];  Service: Neurosurgery;  Laterality: N/A;  . SAVORY DILATION N/A 06/18/2013   Procedure: SAVORY DILATION;  Surgeon: Danie Binder, MD;  [facility];  Service: Endoscopy;  Laterality: N/A;  . TEE WITHOUT CARDIOVERSION N/A 10/04/2012   Procedure: TRANSESOPHAGEAL ECHOCARDIOGRAM (TEE);  Surgeon: Thayer Headings, MD;  Location: [facility];  Service: Cardiovascular;  Laterality: N/A;  . [place] accident  Both legs fractured and repaired surgically    FAMILY HISTORY: Family History  Problem Relation Age of Onset  . Diabetes Mother   . Hypertension Mother   . Glaucoma Mother   . Retinal detachment Mother   . Hypertension Father   . Diabetes Brother   . Glaucoma Brother   . Colon polyps  Neg Hx   . Colon  cancer Neg Hx     SOCIAL HISTORY:  Social History   Socioeconomic History  . Marital status: Single    Spouse name: Not on file  . Number of children: 0  . Years of education: HS  . Highest education level: Not on file  Occupational History  . Occupation: Disabled  Tobacco Use  . Smoking status: Former Smoker    Packs/day: 1.00    Years: 29.00    Pack years: 29.00    Types: Cigarettes    Quit date: 02/22/1992    Years since quitting: 28.2  . Smokeless tobacco: Never Used  Vaping Use  . Vaping Use: Never used  Substance and Sexual Activity  . Alcohol use: Not Currently    Comment: occasionally beer  . Drug use: Yes    Types: Marijuana, "Crack" cocaine    Comment: hx of crack cocaine "long time ago" per pt - hard to tell how long it is has been since he used,  . Sexual activity: Not on file  Other Topics Concern  . Not on file  Social History Narrative   Patient lives at home with his family. Brother, sister, mother    Patient does not work.    Patient has a high school education.    Patient has one step child    Social Determinants of Health   Financial Resource Strain: Not on file  Food Insecurity: Not on file  Transportation Needs: Not on file  Physical Activity: Not on file  Stress: Not on file  Social Connections: Not on file  Intimate Partner Violence: Not on file     PHYSICAL EXAM   Vitals:   05/20/20 1253  BP: 124/76  Pulse: 84   Not recorded     There is no height or weight on file to calculate BMI.   PHYSICAL EXAMNIATION:  PHYSICAL EXAMNIATION:  Gen: NAD, conversant, well nourised, well groomed                     Cardiovascular: Regular rate rhythm, no peripheral edema, warm, nontender. Eyes: Conjunctivae clear without exudates or hemorrhage Neck: Supple, no carotid bruits. Pulmonary: Clear to auscultation bilaterally   NEUROLOGICAL EXAM:  MENTAL STATUS: Speech/Cognition: Awake, alert, normal speech, oriented to history taking  and casual conversation.  CRANIAL NERVES: CN II: Visual fields are full to confrontation.  Pupils are round equal and briskly reactive to light. CN III, IV, VI: extraocular movement are normal. No ptosis. CN V: Facial sensation is intact to light touch. CN VII: Face is symmetric with normal eye closure and smile. CN VIII: Hearing is normal to casual conversation CN IX, X: Palate elevates symmetrically. Phonation is normal. CN XI: Head turning and shoulder shrug are intact  MOTOR: Spastic left hemiparesis, no antigravity movement of left upper extremity, limited range of motion of left shoulder, significant spasticity of left pectoralis major, left latissimus dorsi, complains of pain with passive movement, tendency for left arm pronation, okay range of motion of left elbow, tends to hold left thumb in position, with passive movement, he has partial extension of fingers, tight left flexor forearm muscle tendons  He is able to raise left leg against gravity, left plantar foot pain, constant left 1st toe extension   REFLEXES: Hyperreflexia of left upper and lower extremities,  SENSORY: Withdrawal to pain bilaterally  COORDINATION: There is no trunk or limb ataxia.    GAIT/STANCE: Deferred    Lab  Results  Component Value Date   WBC 4.5 03/12/2020   HGB 14.2 03/12/2020   HCT 43.1 03/12/2020   MCV 90.7 03/12/2020   PLT 116 (L) 03/12/2020      Component Value Date/Time   NA 141 03/12/2020 2230   NA 138 09/12/2019 1539   K 4.2 03/12/2020 2230   CL 103 03/12/2020 2230   CO2 29 03/12/2020 2230   GLUCOSE 121 (H) 03/12/2020 2230   BUN 13 03/12/2020 2230   BUN 13 09/12/2019 1539   CREATININE 1.18 03/12/2020 2230   CALCIUM 9.2 03/12/2020 2230   PROT 7.6 03/12/2020 2230   PROT 7.5 09/12/2019 1539   ALBUMIN 4.2 03/12/2020 2230   ALBUMIN 4.8 09/12/2019 1539   AST 24 03/12/2020 2230   ALT 22 03/12/2020 2230   ALKPHOS 63 03/12/2020 2230   BILITOT 0.3 03/12/2020 2230   BILITOT  0.6 09/12/2019 1539   GFRNONAA >60 03/12/2020 2230   GFRAA 74 09/12/2019 1539   Lab Results  Component Value Date   CHOL 160 09/13/2016   HDL 59 09/13/2016   LDLCALC 43 09/13/2016   TRIG 288 (H) 09/13/2016   CHOLHDL 2.7 09/13/2016   Lab Results  Component Value Date   HGBA1C 7.1 (H) 09/12/2019   Lab Results  Component Value Date   VZCHYIFO27 741 03/01/2018   Lab Results  Component Value Date   TSH 3.490 09/12/2019      ASSESSMENT AND PLAN  SHAYDE GERVACIO is a 65 y.o. male  Congenital deaf, use sign language, history is through interpreter   History of cervical spondylitic myelopathy, presented with worsening gait abnormality  Status post cervical decompression surgery in August 2017.  History of seizure,  Difficulty tolerating Keppra, with increaseed aggressive behavior, ED presentation on February 25, 2017,  Continue lamotrigine 100 mg 2 tablets twice a day,  Worsening memory loss  Is most consistent with his large size right MCA stroke, encephalomalacia, extensive periventricular small vessel disease  Normal B12, TSH  his worsening depression, lack of sleep likely contributed to, will increase trazodone to 50 mg 2 tablets every night, new prescription was written, stop Lexapro to avoid polypharmacy, he reported no noticeable benefit with Lexapro  New development of left retinal hemorrhage versus vascular tumor, is followed up by [facility] ophthalmologist, on multiple eyedrops,  Worsening headaches,    History of stroke, with residual spastic left hemiparesis  Vascular risk factor of hypertension, hyperlipidemia, keep Plavix daily    Eelectrical stimulation guided xeomin injection, used 500 units (400 units to spastic left upper extremity, 100 unit injected according to migraine protocol)  Left brachialis 50 units  left pronator teres 50 units Left flexor digitorum profundi 50 units Left flexor digitorum superficialis 50 units  Left opponents 50  units Left  palmaris longus 50 units Left flexor carpi ulnaris 50 units Left flexor carpi radialis 25 units Left lumbricals 25 units  Left extensor pollicis longus 50 units Left Flexor digitorum longus 25 units Left tibialis posterior 25 units     Marcial Pacas, M.D. Ph.D.  [facility] [address] [place], [place] [address] [phone]

## 2020-05-21 DIAGNOSIS — M722 Plantar fascial fibromatosis: Secondary | ICD-10-CM | POA: Diagnosis not present

## 2020-05-21 DIAGNOSIS — Z299 Encounter for prophylactic measures, unspecified: Secondary | ICD-10-CM | POA: Diagnosis not present

## 2020-05-21 DIAGNOSIS — I739 Peripheral vascular disease, unspecified: Secondary | ICD-10-CM | POA: Diagnosis not present

## 2020-05-21 DIAGNOSIS — I10 Essential (primary) hypertension: Secondary | ICD-10-CM | POA: Diagnosis not present

## 2020-05-21 DIAGNOSIS — Z87891 Personal history of nicotine dependence: Secondary | ICD-10-CM | POA: Diagnosis not present

## 2020-05-21 DIAGNOSIS — E1165 Type 2 diabetes mellitus with hyperglycemia: Secondary | ICD-10-CM | POA: Diagnosis not present

## 2020-05-21 DIAGNOSIS — G8194 Hemiplegia, unspecified affecting left nondominant side: Secondary | ICD-10-CM | POA: Diagnosis not present

## 2020-05-22 DIAGNOSIS — G8114 Spastic hemiplegia affecting left nondominant side: Secondary | ICD-10-CM | POA: Diagnosis not present

## 2020-05-22 MED ORDER — INCOBOTULINUMTOXINA 100 UNITS IM SOLR
500.0000 [IU] | INTRAMUSCULAR | Status: DC
Start: 2020-05-22 — End: 2020-08-12
  Administered 2020-05-22: 500 [IU] via INTRAMUSCULAR

## 2020-06-01 ENCOUNTER — Other Ambulatory Visit: Payer: Self-pay

## 2020-06-01 ENCOUNTER — Ambulatory Visit (INDEPENDENT_AMBULATORY_CARE_PROVIDER_SITE_OTHER): Payer: Medicare Other | Admitting: Podiatry

## 2020-06-01 DIAGNOSIS — L600 Ingrowing nail: Secondary | ICD-10-CM

## 2020-06-01 DIAGNOSIS — M79675 Pain in left toe(s): Secondary | ICD-10-CM

## 2020-06-01 NOTE — Progress Notes (Signed)
   HPI: 65 y.o. male presenting today for evaluation of pain and tenderness to the medial aspect of the left great toe.  Patient's brother who is present upon exam today states that he has been complaining of pain and tenderness to the medial aspect of the left great toe.  He comes in routinely for routine foot care.  He denies any injury.  He presents for further treatment evaluation  Past Medical History:  Diagnosis Date  . Anemia   . Anxiety   . Asthma   . Deaf   . Depression   . Essential hypertension   . GERD (gastroesophageal reflux disease)   . History of migraine   . History of stroke    Spastic hemiplegia, right MCA stroke April 2014 in [place]  . Mixed hyperlipidemia   . PAD (peripheral artery disease) ([place])   . Sarcoidosis   . Seizures ([place])    Last one over 2 yrs ago in 2018, controlled with med  . Shingles   . Type 2 diabetes mellitus ([place])      Physical Exam: General: The patient is alert and oriented x3 in no acute distress.  Dermatology: Skin is warm, dry and supple bilateral lower extremities. Negative for open lesions or macerations.  It appears that the medial border of the left great toe is incurvated and digging into the respective nail fold.  Vascular: Palpable pedal pulses bilaterally. No edema or erythema noted. Capillary refill within normal limits.  Neurological: Epicritic and protective threshold grossly intact bilaterally.   Musculoskeletal Exam: Range of motion within normal limits to all pedal and ankle joints bilateral. Muscle strength 5/5 in all groups bilateral.   Assessment: 1.  Incurvated ingrowing nail medial border left great toe   Plan of Care:  1. Patient evaluated. 2.  Mechanical debridement of the offending nail border was performed and the patient felt immediate relief. 3.  Recommend good foot hygiene and shoes that do not irritate the area 4.  Return to clinic on neck scheduled appointment for routine foot care with Dr.  April Holding, DPM [facility]  Dr. Edrick Kins, DPM    [address], [place] [address]                Office [phone]  Fax [phone]

## 2020-06-11 DIAGNOSIS — I739 Peripheral vascular disease, unspecified: Secondary | ICD-10-CM | POA: Diagnosis not present

## 2020-06-11 DIAGNOSIS — G8194 Hemiplegia, unspecified affecting left nondominant side: Secondary | ICD-10-CM | POA: Diagnosis not present

## 2020-06-11 DIAGNOSIS — I10 Essential (primary) hypertension: Secondary | ICD-10-CM | POA: Diagnosis not present

## 2020-06-11 DIAGNOSIS — Z299 Encounter for prophylactic measures, unspecified: Secondary | ICD-10-CM | POA: Diagnosis not present

## 2020-06-11 DIAGNOSIS — E1165 Type 2 diabetes mellitus with hyperglycemia: Secondary | ICD-10-CM | POA: Diagnosis not present

## 2020-06-24 DIAGNOSIS — N401 Enlarged prostate with lower urinary tract symptoms: Secondary | ICD-10-CM | POA: Diagnosis not present

## 2020-06-24 DIAGNOSIS — R351 Nocturia: Secondary | ICD-10-CM | POA: Diagnosis not present

## 2020-06-24 DIAGNOSIS — R350 Frequency of micturition: Secondary | ICD-10-CM | POA: Diagnosis not present

## 2020-06-24 DIAGNOSIS — R3915 Urgency of urination: Secondary | ICD-10-CM | POA: Diagnosis not present

## 2020-07-02 DIAGNOSIS — Z299 Encounter for prophylactic measures, unspecified: Secondary | ICD-10-CM | POA: Diagnosis not present

## 2020-07-02 DIAGNOSIS — Z6827 Body mass index (BMI) 27.0-27.9, adult: Secondary | ICD-10-CM | POA: Diagnosis not present

## 2020-07-02 DIAGNOSIS — Z713 Dietary counseling and surveillance: Secondary | ICD-10-CM | POA: Diagnosis not present

## 2020-07-02 DIAGNOSIS — H6122 Impacted cerumen, left ear: Secondary | ICD-10-CM | POA: Diagnosis not present

## 2020-07-10 DIAGNOSIS — H903 Sensorineural hearing loss, bilateral: Secondary | ICD-10-CM | POA: Diagnosis not present

## 2020-07-17 ENCOUNTER — Encounter: Payer: Self-pay | Admitting: Podiatry

## 2020-07-17 ENCOUNTER — Ambulatory Visit (INDEPENDENT_AMBULATORY_CARE_PROVIDER_SITE_OTHER): Payer: Medicare Other | Admitting: Podiatry

## 2020-07-17 ENCOUNTER — Other Ambulatory Visit: Payer: Self-pay

## 2020-07-17 DIAGNOSIS — F419 Anxiety disorder, unspecified: Secondary | ICD-10-CM | POA: Insufficient documentation

## 2020-07-17 DIAGNOSIS — J209 Acute bronchitis, unspecified: Secondary | ICD-10-CM | POA: Insufficient documentation

## 2020-07-17 DIAGNOSIS — Z6824 Body mass index (BMI) 24.0-24.9, adult: Secondary | ICD-10-CM | POA: Insufficient documentation

## 2020-07-17 DIAGNOSIS — M21372 Foot drop, left foot: Secondary | ICD-10-CM | POA: Diagnosis not present

## 2020-07-17 DIAGNOSIS — M2011 Hallux valgus (acquired), right foot: Secondary | ICD-10-CM

## 2020-07-17 DIAGNOSIS — I10 Essential (primary) hypertension: Secondary | ICD-10-CM | POA: Diagnosis not present

## 2020-07-17 DIAGNOSIS — E119 Type 2 diabetes mellitus without complications: Secondary | ICD-10-CM

## 2020-07-17 DIAGNOSIS — Z6827 Body mass index (BMI) 27.0-27.9, adult: Secondary | ICD-10-CM | POA: Insufficient documentation

## 2020-07-17 DIAGNOSIS — M2012 Hallux valgus (acquired), left foot: Secondary | ICD-10-CM

## 2020-07-17 DIAGNOSIS — B351 Tinea unguium: Secondary | ICD-10-CM | POA: Diagnosis not present

## 2020-07-17 DIAGNOSIS — M79609 Pain in unspecified limb: Secondary | ICD-10-CM | POA: Diagnosis not present

## 2020-07-17 DIAGNOSIS — Z299 Encounter for prophylactic measures, unspecified: Secondary | ICD-10-CM | POA: Diagnosis not present

## 2020-07-17 DIAGNOSIS — Z1331 Encounter for screening for depression: Secondary | ICD-10-CM | POA: Insufficient documentation

## 2020-07-17 DIAGNOSIS — E1165 Type 2 diabetes mellitus with hyperglycemia: Secondary | ICD-10-CM | POA: Diagnosis not present

## 2020-07-17 DIAGNOSIS — R059 Cough, unspecified: Secondary | ICD-10-CM | POA: Insufficient documentation

## 2020-07-20 NOTE — Progress Notes (Signed)
Charles [name]  Subjective: Charles [name] presents today for for Charles diabetic foot examination and painful thick toenails that are difficult to trim. Pain interferes with ambulation. Aggravating factors include wearing enclosed shoe gear. Pain is relieved with periodic professional debridement.   Charles [name] is deaf and is accompanied by his sign language interpreter, Charles [name], on today's visit. His brother, Charles [name], is also present during today's visit. Charles [name] states Charles [name] no longer resides at [facility]. [name] now lives with Charles [name] and his wife.  They voice no new pedal concerns on today's visit.  Patient relates >5 year h/o diabetes.  Patient denies h/o foot wounds.  Patient denies symptoms of foot numbness.  Patient denies symptoms of foot tingling.  Patient denies symptoms of burning in feet.  Patient's blood sugar was 129 mg/dl on yesterday.  Brother, Charles [name], states last A1c was around 12% and Charles [name] will be seen today by Dr. Manuella Ghazi.  [name], MD is patient's PCP. Last visit was 02/24/2020.  Past Medical History:  Diagnosis Date  . Anemia   . Anxiety   . Asthma   . Deaf   . Depression   . Essential hypertension   . GERD (gastroesophageal reflux disease)   . History of migraine   . History of stroke    Spastic hemiplegia, right MCA stroke April 2014 in [place]  . Mixed hyperlipidemia   . PAD (peripheral artery disease) ([place])   . Sarcoidosis   . Seizures ([place])    Last one over 2 yrs ago in 2018, controlled with med  . Shingles   . Type 2 diabetes mellitus [facility])    Patient Active Problem List   Diagnosis Date Noted  . Acute bronchitis 07/17/2020  . Anxiety 07/17/2020  . Body mass index (BMI) 24.0-24.9, adult 07/17/2020  . Body mass index (bmi) 27.0-27.9, adult 07/17/2020  . Cough 07/17/2020  . Screening for depression 07/17/2020  . Close exposure to COVID-19 virus 10/19/2019  . Bilateral lower extremity edema 10/17/2019  . Asthma  10/05/2019  . BPH (benign prostatic hyperplasia) 10/05/2019  . Coagulation defect ([place]) 07/09/2019  . Cerebrovascular accident (CVA) due to thrombosis of right middle cerebral artery ([place]) 03/18/2019  . Chronic nonintractable headache 09/12/2018  . Chronic migraine 09/12/2018  . Cerebrovascular accident (CVA) ([place]) 03/01/2018  . Polypharmacy 03/01/2018  . 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 Nonintractable headache 06/27/2017  . Mixed hyperlipidemia 05/17/2017  . Uncontrolled type 2 diabetes mellitus with hyperglycemia ([place]) 05/16/2017  . Thrombocytopenia ([place]) 09/13/2016  . Hypoglycemia   . Syncope 09/12/2016  . Seizure disorder as sequela of cerebrovascular accident ([place]) 09/12/2016  . MDD (major depressive disorder), recurrent episode, severe ([place]) 12/30/2015  . Suicidal ideation   . Other pancytopenia ([place]) 12/23/2015  . Loss of weight 11/12/2015  . 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 Iron deficiency anemia 09/22/2015  . Spinal stenosis in cervical region 08/21/2015  . Neck pain 08/12/2015  . Low back pain 08/12/2015  . Abnormality of gait 08/12/2015  . Neuropathy of right upper extremity 06/29/2015  . Acute rhinitis 05/05/2015  . Abdominal pain, epigastric 04/23/2015  . Arm pain, right 04/23/2015  . Atypical chest pain 07/23/2013  . 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 Dyspepsia 06/13/2013  . Dysphagia 06/13/2013  . Headache 11/30/2012  . Solitary pulmonary nodule 11/20/2012  . Spastic hemiplegia affecting left nondominant side ([place]) 11/08/2012  . Essential hypertension, benign 10/12/2012  .  COPD GOLD II with reversibility  10/12/2012  . 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 CVA (cerebral vascular accident) ([place]) 10/03/2012  . Diabetes ([place]) 10/03/2012  . Sarcoidosis ([place]) 10/03/2012   Past Surgical History:  Procedure Laterality Date  . AIR/FLUID EXCHANGE Left  10/04/2018   Procedure: Air/Fluid Exchange;  Surgeon: Bernarda Caffey, MD;  Location: [facility];  Service: Ophthalmology;  Laterality: Left;  . BRAVO PH STUDY N/A 06/18/2013   pH STUDY SHOWS NEXIUM TWICE DAILY CONTROLS THE ACID IN HIS STOMACH. HE HAD VERY FEWEPISODE OF REGURGITATION RECORDED IN THE 2 DAYS THE STUDY WAS PERFORMED.   [name] COLONOSCOPY  2011   IN [place]  . 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   . ESOPHAGOGASTRODUODENOSCOPY N/A 05/05/2015   Procedure: ESOPHAGOGASTRODUODENOSCOPY (EGD);  Surgeon: Danie Binder, MD;  [facility];  Service: Endoscopy;  Laterality: N/A;  730  . INJECTION OF SILICONE OIL Left 2/35/5732   Procedure: Injection Of Silicone Oil;  Surgeon: Bernarda Caffey, MD;  Location: [facility];  Service: Ophthalmology;  Laterality: Left;  . LASER PHOTO ABLATION Left 10/04/2018   Procedure: Laser Photo Ablation;  Surgeon: Bernarda Caffey, MD;  Location: [facility];  Service: Ophthalmology;  Laterality: Left;  [name] MALONEY DILATION N/A 06/18/2013   Procedure: MALONEY DILATION;  Surgeon: Danie Binder, MD;  [facility];  Service: Endoscopy;  Laterality: N/A;  . OTHER SURGICAL HISTORY     cyst removal head and chest  . PARS PLANA VITRECTOMY Left 10/04/2018   Procedure: PARS PLANA VITRECTOMY WITH 25 GAUGE;  Surgeon: Bernarda Caffey, MD;  Location: [facility];  Service: Ophthalmology;  Laterality: Left;  . POSTERIOR CERVICAL FUSION/FORAMINOTOMY N/A 10/07/2015   Procedure: Cervical Three-Four, Cervical Four-Five, Cervical Five-Six, Cervical Six-Seven Posterior Cervical Laminectomy and Fusion;  Surgeon: Kary Kos, MD;  Location: [facility]URO [facility]S;  Service: Neurosurgery;  Laterality: N/A;  . SAVORY  DILATION N/A 06/18/2013   Procedure: SAVORY DILATION;  Surgeon: Danie Binder, MD;  [facility];  Service: Endoscopy;  Laterality: N/A;  . TEE WITHOUT CARDIOVERSION N/A 10/04/2012   Procedure: TRANSESOPHAGEAL ECHOCARDIOGRAM (TEE);  Surgeon: Thayer Headings, MD;  Location: [facility] ENDOSCOPY;  Service: Cardiovascular;  Laterality: N/A;  . [place] accident  Both legs fractured and repaired surgically   Current Outpatient Medications on File Prior to Visit  Medication Sig Dispense Refill  . ADVAIR HFA 230-21 MCG/ACT inhaler SMARTSIG:2 Puff(s) By Mouth Twice Daily    . albuterol (PROAIR HFA) 108 (90 Base) MCG/ACT inhaler Inhale 2 puffs into the lungs every 4 (four) hours as needed for wheezing or shortness of breath. 6.7 g 5  . amLODipine (NORVASC) 5 MG tablet Take 5 mg by mouth daily.    [name] ascorbic acid (VITAMIN C) 500 MG tablet Take by mouth.    [name] atorvastatin (LIPITOR) 10 MG tablet Take 10 mg by mouth daily. (cholesterol)    . atorvastatin (LIPITOR) 20 MG tablet Take 20 mg by mouth at bedtime.    [name] atropine 1 % ophthalmic solution INSTILL 1 DROP IN LEFT EYE TWICE DAILY 5 mL 1  . bacitracin-polymyxin b (POLYSPORIN) ophthalmic ointment Place 1 application into the left eye 4 (four) times daily. apply to eye every  12 hours while awake 3.5 g 6  . brimonidine (ALPHAGAN) 0.2 % ophthalmic solution Place 1 drop into the left eye 3 (three) times daily. 10 mL 3  . Cholecalciferol 25 MCG (1000 UT) tablet Take by mouth.    . clopidogrel (PLAVIX) 75 MG tablet Take 75 mg by mouth daily.    . clotrimazole (LOTRIMIN) 1 % cream Apply to both feet and between toes twice daily for 4 weeks. 45 g 1  . dorzolamide-timolol (COSOPT) 22.3-6.8 MG/ML ophthalmic solution INSTILL 1 DROP IN LEFT EYE THREE TIMES DAILY 10 mL 3  . doxycycline (VIBRAMYCIN) 100 MG capsule Take 100 mg by mouth 2 (two) times daily.    . Emollient (EUCERIN) lotion Apply topically daily.    [name] escitalopram  (LEXAPRO) 20 MG tablet Take by mouth.    . escitalopram (LEXAPRO) 20 MG tablet Take 20 mg by mouth daily.    . furosemide (LASIX) 20 MG tablet Take 20 mg by mouth daily.    . furosemide (LASIX) 20 MG tablet Take by mouth.    . gatifloxacin (ZYMAXID) 0.5 % SOLN Place 1 drop into the left eye 4 (four) times daily.    [name] ibuprofen (ADVIL) 600 MG tablet Take 600 mg by mouth every 6 (six) hours as needed.    . IncobotulinumtoxinA ([name] IM) Inject 500 Units into the muscle every 3 (three) months.    [name] JARDIANCE 10 MG TABS tablet Take 10 mg by mouth daily.    [name] lamoTRIgine (LAMICTAL) 100 MG tablet Take two tablets twice daily. 360 tablet 4  . levofloxacin (LEVAQUIN) 750 MG tablet Take 750 mg by mouth daily.    [name] LORazepam (ATIVAN) 0.5 MG tablet Take 0.5 mg by mouth 2 (two) times daily.    [name] losartan (COZAAR) 100 MG tablet Take 100 mg by mouth daily.    . metFORMIN (GLUCOPHAGE) 500 MG tablet Take by mouth.    . metFORMIN (GLUCOPHAGE) 500 MG tablet Take 500 mg by mouth 2 (two) times daily.    . multivitamin (ONE-A-DAY MEN'S) TABS tablet Take 1 tablet by mouth daily.    [name] omeprazole (PRILOSEC) 20 MG capsule Take 20 mg by mouth daily.    [name] oxybutynin (DITROPAN XL) 15 MG 24 hr tablet Take 15 mg by mouth daily.    . prednisoLONE acetate (PRED FORTE) 1 % ophthalmic suspension Place 1 drop into the left eye 4 (four) times daily. 15 mL 0  . predniSONE (DELTASONE) 20 MG tablet Take by mouth.    . predniSONE (DELTASONE) 20 MG tablet 3 po daily for 2 days, then 2 po daily for 2 days, then 1 po daily for 2 days    . risperiDONE (RISPERDAL) 0.5 MG tablet Take by mouth.    . risperiDONE (RISPERDAL) 0.5 MG tablet Take 0.5 mg by mouth at bedtime.    . sertraline (ZOLOFT) 25 MG tablet Take 25 mg by mouth daily.    . tamsulosin (FLOMAX) 0.4 MG CAPS capsule Take 0.4 mg by mouth daily.    . traZODone (DESYREL) 50 MG tablet Take 2 tablets (100 mg total) by mouth at bedtime. 60 tablet 11  . umeclidinium-vilanterol  (ANORO ELLIPTA) 62.5-25 MCG/INH AEPB Inhale 1 puff into the lungs daily. 1 each 5  . zinc sulfate 220 (50 Zn) MG capsule Take by mouth.     Current Facility-Administered Medications on File Prior to Visit  Medication Dose Route Frequency Provider Last Rate Last Admin  . incobotulinumtoxinA ([name]) 100 units injection 500  Units  500 Units Intramuscular Q90 days Marcial Pacas, MD   500 Units at 06/19/19 1459  . incobotulinumtoxinA ([name]) 100 units injection 500 Units  500 Units Intramuscular Q90 days Marcial Pacas, MD   500 Units at 05/22/20 1404    Allergies  Allergen Reactions  . Iodinated Diagnostic Agents Itching and Anaphylaxis  . Iodine-131 Itching and Swelling  . Shellfish-Derived Products Itching  . Codeine Itching    Other reaction(s): Unknown  . Hydrocodone Other (See Comments)    Makes "looney" Other reaction(s): Other Makes "looney"  . Oxycodone Nausea And Vomiting and Itching  . Shellfish Allergy Hives and Swelling  . Metformin Diarrhea   Social History   Occupational History  . Occupation: Disabled  Tobacco Use  . Smoking status: Former Smoker    Packs/day: 1.00    Years: 29.00    Pack years: 29.00    Types: Cigarettes    Quit date: 02/22/1992    Years since quitting: 28.4  . Smokeless tobacco: Never Used  Vaping Use  . Vaping Use: Never used  Substance and Sexual Activity  . Alcohol use: Not Currently    Comment: occasionally beer  . Drug use: Yes    Types: Marijuana, "Crack" cocaine    Comment: hx of crack cocaine "long time ago" per pt - hard to tell how long it is has been since he used,  . Sexual activity: Not on file   Family History  Problem Relation Age of Onset  . Diabetes Mother   . Hypertension Mother   . Glaucoma Mother   . Retinal detachment Mother   . Hypertension Father   . Diabetes Brother   . Glaucoma Brother   . Colon polyps Neg Hx   . Colon cancer Neg Hx    Immunization History  Administered Date(s) Administered  . Influenza Inj  Mdck Quad With Preservative 11/22/2016  . Influenza Whole 11/22/2011  . Influenza, Seasonal, Injecte, Preservative Fre 01/13/2014  . Influenza,inj,Quad PF,6+ Mos 12/01/2012, 02/03/2015, 12/23/2015  . Moderna Sars-Covid-2 Vaccination 03/29/2019, 04/26/2019  . PPD Test 07/18/2017  . Pneumococcal Conjugate-13 02/22/2012  . Pneumococcal Polysaccharide-23 09/26/2012     Review of Systems: Negative except as noted in the HPI.  Objective: There were no vitals filed for this visit.  Charles [name] is a pleasant 65 y.o. male in NAD. AAO X 3.  Vascular Examination: Capillary refill time to digits immediate b/l. Palpable DP [name](s) right lower extremity Palpable PT [name](s) b/l lower extremities Faintly palpable DP [name](s) left lower extremity. Pedal hair present. Lower extremity skin temperature gradient within normal limits.  Dermatological Examination: Pedal skin with normal turgor, texture and tone bilaterally. No open wounds bilaterally. No interdigital macerations bilaterally. Toenails 1-5 b/l elongated, discolored, dystrophic, thickened, crumbly with subungual debris and tenderness to dorsal palpation. Incurvated nailplate medial border(s) L hallux.  Nail border hypertrophy minimal. There is tenderness to palpation. Sign(s) of infection: no clinical signs of infection noted on examination today..  Musculoskeletal Examination: Muscle strength 5/5 to all LE muscle groups of right lower extremity. Dropfoot left foot. Hallux valgus with bunion deformity noted b/l lower extremities.  Footwear Assessment: Does the patient wear appropriate shoes? Yes. Does the patient need inserts/orthotics? No.  Neurological Examination: Protective sensation intact 5/5 intact bilaterally with 10g monofilament b/l. Vibratory sensation intact b/l.  Hemoglobin A1C Latest Ref Rng & Units 09/12/2019  HGBA1C 4.8 - 5.6 % 7.1(H)  Some recent data might be hidden   Assessment: 1. Pain due to onychomycosis  of nail    2. Left foot drop   3. Hallux valgus, acquired, bilateral   4. Diabetes mellitus without complication ([place])   5. Encounter for diabetic foot [name] ([place])     ADA Risk Categorization: Low Risk :  Patient has all of the following: Intact protective sensation No prior foot ulcer  No severe deformity Pedal pulses present  Plan: -Examined patient. -Diabetic foot examination performed on today's visit. -Patient to continue soft, supportive shoe gear daily. -Toenails 1-5 b/l were debrided in length and girth with sterile nail nippers and dremel without iatrogenic bleeding.  -Offending nail border debrided and curretaged L hallux utilizing sterile nail nipper and currette. Border cleansed with alcohol and triple antibiotic applied. No further treatment required by patient/caregiver. -Patient to report any pedal injuries to medical professional immediately. -Patient/POA to call should there be question/concern in the interim.  Return in about 3 months (around 10/17/2020).  Marzetta Board, DPM

## 2020-08-12 ENCOUNTER — Other Ambulatory Visit: Payer: Self-pay | Admitting: Neurology

## 2020-08-12 ENCOUNTER — Encounter: Payer: Self-pay | Admitting: *Deleted

## 2020-08-12 ENCOUNTER — Telehealth: Payer: Self-pay | Admitting: Neurology

## 2020-08-12 NOTE — Telephone Encounter (Signed)
Pt's brother, Peyten Weare (on [place]) called, pharmacy said refill for traZODone (DESYREL) 50 MG tablet was refused. Would like a call from the nurse.

## 2020-08-12 NOTE — Telephone Encounter (Signed)
Trazodone refills sent 05/20/20, #60 x 11. I called the pharmacy and spoke to [facility]. She confirmed there are refills on file. They will get the rx ready for pick up. I called his brother (on [place]) and left a message with this information. Provided our number to call back if he has any further issues.

## 2020-08-26 ENCOUNTER — Ambulatory Visit (INDEPENDENT_AMBULATORY_CARE_PROVIDER_SITE_OTHER): Payer: Medicare Other | Admitting: Neurology

## 2020-08-26 ENCOUNTER — Other Ambulatory Visit: Payer: Self-pay

## 2020-08-26 VITALS — BP 124/78 | HR 67

## 2020-08-26 DIAGNOSIS — G8929 Other chronic pain: Secondary | ICD-10-CM

## 2020-08-26 DIAGNOSIS — G8114 Spastic hemiplegia affecting left nondominant side: Secondary | ICD-10-CM

## 2020-08-26 DIAGNOSIS — I69398 Other sequelae of cerebral infarction: Secondary | ICD-10-CM | POA: Diagnosis not present

## 2020-08-26 DIAGNOSIS — R519 Headache, unspecified: Secondary | ICD-10-CM

## 2020-08-26 DIAGNOSIS — G40909 Epilepsy, unspecified, not intractable, without status epilepticus: Secondary | ICD-10-CM

## 2020-08-26 DIAGNOSIS — I63311 Cerebral infarction due to thrombosis of right middle cerebral artery: Secondary | ICD-10-CM

## 2020-08-26 DIAGNOSIS — F32A Depression, unspecified: Secondary | ICD-10-CM | POA: Diagnosis not present

## 2020-08-26 MED ORDER — INCOBOTULINUMTOXINA 100 UNITS IM SOLR
500.0000 [IU] | INTRAMUSCULAR | Status: DC
  Administered 2020-08-26: 500 [IU] via INTRAMUSCULAR

## 2020-08-26 MED ORDER — LAMOTRIGINE 100 MG PO TABS: ORAL_TABLET | ORAL | 4 refills | Status: DC

## 2020-08-26 NOTE — Progress Notes (Signed)
**  [name] 100 units x 5 vials, NDC 0259-1610-01, Lot [id number], Exp 04/2022, office supply.//mck,rn**

## 2020-08-26 NOTE — Progress Notes (Signed)
Chief Complaint  Patient presents with   Procedure    [name]      PATIENT: Charles [name] DOB: [birth year]  HISTORICAL  Charles [name] is a 65 years old right-handed male, accompanied by his mother, and his brother, interpreter, for EMG guided Botox injection for his spastic left hemiparesis,I saw him first in Nov 2015, he was previously patients of Dr. Leonie Man, and Dr. Janann Colonel, who has performed EMG guided Botox injection in January, April, July 2015, for his spastic left hemiparesis, which he responded very well  He was born deaf, He had a history of pulmonary sarcoidosis, was treated with long-term low-dose steroid, also with past medical history of hypertension, diabetes, hyperlipidemia,  He suffered stroke in April 2014, was treated at [place], with residual severe left hemiparesis, per record  MRI of the brain showed remote right hemispheric infarct and small vessel disease type changes  MRA of brain abrupt  cutoff of flow at the right carotid terminus with nonvisualization of right middle cerebral artery branch vessels consistent with the patient's remote right hemispheric infarct.  Fetal type origin of the posterior cerebral arteries with posterior cerebral artery supplied predominately from the anterior circulation.  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    2D Echocardiogram 60%, wall motion normal, LA normal size Carotid Doppler  Bilateral: 1-39% ICA stenosis. Vertebral artery flow is antegrade  TEE no PFO, Pulm AVM  EEG in August 2014, normal  awake and asleep EEG. No focal or generalized epileptiform discharges noted   UPDATE May 8th 2017: He is with his mother and brother at today's clinical visit, last visit was November 25th 2015 for EMG guided Botox injection to his spastic left upper and lower extremity, he denies significant improvement with Botox injection.  Today he came in with a new issue, complains a year history of intermittent  right elbow discomfort, radiating pain to right arm, right hand, mainly involving right fifths, and fourth fingers, it happened with prolonged sitting, eating, or sleeping, he also noticed mild right hand weakness, difficulty to unscrew the bottle top. 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 He complains progressive worsening gait abnormality, falling few times, neck pain, low back pain, radiating pain to his right leg  UPDATE August 24 2015: He is accompanied by his mother brother and interpreter at today's clinical visit, he continue complains of low back pain radiating pain to right lower extremity, intermittent right arm hands paresthesia, worsening gait difficulty, urinary urgency,  We have personally reviewed MRI of cervical spine in June 2017: There is severe spinal stenosis at C3-C4 and at C4-C5 due to central disc protrusions/herniations, uncovertebral spurring, facet hypertrophy and congenitally short pedicles. There is subtle hyperintense signal within the spinal cord on sagittal STIR images just below the C3-C4 interspace but this is not confirmed on axial images. This could represent a mild cervical myelopathy. 2.    There is moderate spinal stenosis at C5-C6 and C6-C7 mild spinal stenosis at C2-C3 to degenerative changes and congenitally short pedicles. 3.   At C3-C4 there is moderately severe bilateral foraminal narrowing at could lead to impingement either of the C4 nerve roots.   There does not appear to be nerve  root impingement at the other cervical levels  MRI of lumbar spine in June 2017: At L4-L5, there is broad disc bulging, moderately severe facet hypertrophy, right greater than left, and minimal  anterolisthesis. There is no nerve root compression at this level. When compared to the MRI dated 12/24/2012, the synovial cyst that was noted to the left on the prior MRI is no longer present. This relieves the left L5 nerve root impingement that was noted at that time. However, since the prior MRI there has been mild progression of the facet hypertrophy and minimal anterolisthesis . 2.    There are milder degenerative changes at L3-L4 and L5-S1 that did not lead to any nerve root impingement, unchanged when compared to the previous MRI.  UPDATE Dec 03 2015: He had cervical decompression laminectomy C3-4-5 6 with foraminotomies of the C4-5 VI nerve roots by Dr. Saintclair Halsted on October 07 2015, patient reported significant improvement in his neck pain, family noticed he has difficulty sleeping, agitated.  I personally reviewed MRI cervical spine in September 2017: Posterior hardware fusion C3 through C7. Posterior decompression C3 through C5. Small posterior fluid collection in the soft tissues may represent postop fluid versus CSF leak.  Small area of cord hyperintensity at the C4 level is unchanged compatible with chronic myelomalacia. No cord compression.  He complains of low back pain,  MRI of left femur there was no significant abnormality notice, MRI lumbar in June 2017, multilevel degenerative changes, but there was no significant canal or foraminal stenosis   UPDATE Jan 16th 2018: He complains of neck pain, using heating pad, stiff, limited range of motion, lean back to get relief, difficulty to get a comfortable position to sleep, he lost drip of his right hand,  Left leg pain when bearing weight, left leg tightness,   CAT scan of the brain in August 2017 showed large right MCA stroke  Update Jul 05 2016: He complains of worsening left-sided low back pain, unsteady gait, we have personally reviewed MRI of lumbar in June 2017, only mild degenerative changes, there is no significant canal or foraminal  stenosis.  He is taking Mobic/naproxen as needed, worsening spastic left hemiparesis, left hands in position   UPDATE June 5th 2018: He returned for a electronic stimulation guided xeomin injection for spastic left hemiparesis, was emphasize on left upper extremity today,  UPDATE Feb 08 2017: He did respond some to previous xeomin injection 500 units, still has significant left arm spasticity, weakness, left anterior thigh muscle spasm in the achy pain  UPDATE Jun 27 2017: He presented to the emergency room on April 22, 2017, complains of the headaches, apparently he was given cocktail of Compazine, Benadryl, Decadron,  CT head without contrast showed chronic right MCA stroke, no acute abnormality  Laboratory evaluation showed normal TSH, free T4, lipid panel, A1c was mildly elevated 6.0, MRI of left hip on April 26, 2017 showed mild marginal osteophytes on the femoral head, stable since 2017,  He was given right greater occipital nerve perineural steroid injection on June 19, 2017 by Dr. Rolla Flatten, his headache overall has much improved,  UPDATE August 30 2017: He now lives at nursing home, there is no recurrent seizure, he is not taking lower dose of lamotrigine 100 mg daily, responding well to previous EMG guided xeomin injection.  UPDATE Nov 29 2017: He was not sure about the benefit from previous injection, no recurrent seizure, was noted to have left anterior shin area swelling,  UPDATE Jan 9th  2020: He presented to emergency room on February 25, 2018 for increased aggressive behavior at nursing home noncompliant with his medications, instead of continual lamotrigine, he was given a new list of uncollected medications Keppra 500 mg twice daily, which was known to cause increased agitation for him in the past,  We went over his polypharmacy in detail, will change him back to lamotrigine, also check the lamotrigine level for compliance, received 500 units of [name] injection for spastic  left upper extremity today  He was noted to have worsening confusion, we personally reviewed CT scan in September 2019: No acute abnormality, large right MCA stroke encephalomalacia, extensive periventricular small vessel disease  UPDATE September 12 2018: He is doing well, has no recurrent seizure, but complains of frequent headaches, over-the-counter medicine does not help, lateralized mild to moderate headache, with light noise sensitivity, but made it worse, resting was helpful, lasting half day to whole day  he is discharged from nursing home to be with his brother because of his behavior issues, he was on Keppra and lamotrigine, Keppra caused emotional outburst, will stop the Keppra, change to lamotrigine 100 mg 2 tablets twice a day,  Laboratory evaluation in January 2020, lamotrigine was not detected, Keppra was 17.9, normal B12, CMP showed elevated glucose 165, calcium of 8.5, negative troponin  UPDATE Dec 03 2018: He responded well to previous injection, there was no recurrent seizure,  Update March 18, 2019: I reviewed his ophthalmology evaluation on February 12, 2019: Elevated inferotemporal mass OS was resolved heme, 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) the decision was to continue monitoring,  He now lives with his brother at home, no recurrent seizure, taking lamotrigine 100 mg 2 tablets twice a day, continue have occasional emotional outbursts, no longer taking Keppra,  UPDATE June 19 2019: He lives with his brother [name], who reported that patient has increased memory loss, confusion, angry control issues, he misplace things, often get frustrated and angry towards people, he has sleep difficulty, despite trazodone 50 mg every night, he also complains of worsening depression,  He is on polypharmacy treatment, taking lamotrigine 200 mg twice a day, there was no recurrent seizure, on multiple eyedrop frequent [facility]  visit for his left eye issues, 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), secondary neovascular glaucoma, 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  He also complains of frequent headaches, bilateral frontal, more to the right side,  UPDATE May 22 2020: He is accompanied by his brother at today's clinical visit, complains of left foot pain, left first toe extension  UPDATE August 26 2020: He is overall doing better, in better mood, no recurrent seizure, every 3 months Botox injection has helped relax his left arm,  REVIEW OF SYSTEMS: Full 14 system review of systems performed and notable only for as above ALLERGIES: Allergies  Allergen Reactions   Iodinated Diagnostic Agents Itching and Anaphylaxis   Iodine-131 Itching and Swelling   Shellfish-Derived Products Itching   Codeine Itching    Other reaction(s): Unknown   Hydrocodone Other (See Comments)    Makes "looney" Other reaction(s): Other Makes "looney"   Oxycodone Nausea And Vomiting and Itching   Shellfish Allergy Hives and Swelling   Metformin Diarrhea    HOME MEDICATIONS: Current Outpatient Medications on File Prior to Visit  Medication Sig Dispense Refill   ADVAIR HFA 230-21 MCG/ACT inhaler SMARTSIG:2 Puff(s) By Mouth Twice Daily     albuterol (PROAIR HFA) 108 (90 Base)  MCG/ACT inhaler Inhale 2 puffs into the lungs every 4 (four) hours as needed for wheezing or shortness of breath. 6.7 g 5   amLODipine (NORVASC) 5 MG tablet Take 5 mg by mouth daily.     ascorbic acid (VITAMIN C) 500 MG tablet Take by mouth.     atorvastatin (LIPITOR) 20 MG tablet Take 20 mg by mouth at bedtime.     Cholecalciferol 25 MCG (1000 UT) tablet Take by mouth.     clopidogrel (PLAVIX) 75 MG tablet Take 75 mg by mouth daily.     furosemide (LASIX) 20 MG tablet Take 20 mg by mouth daily.     IncobotulinumtoxinA ([name] IM) Inject 500 Units into the muscle every 3 (three) months.     JARDIANCE 10 MG  TABS tablet Take 10 mg by mouth daily.     lamoTRIgine (LAMICTAL) 100 MG tablet Take two tablets twice daily. 360 tablet 4   LORazepam (ATIVAN) 0.5 MG tablet Take 0.5 mg by mouth 2 (two) times daily.     losartan (COZAAR) 100 MG tablet Take 100 mg by mouth daily.     metFORMIN (GLUCOPHAGE) 500 MG tablet Take 500 mg by mouth 2 (two) times daily.     multivitamin (ONE-A-DAY MEN'S) TABS tablet Take 1 tablet by mouth daily.     omeprazole (PRILOSEC) 20 MG capsule Take 20 mg by mouth daily.     oxybutynin (DITROPAN XL) 15 MG 24 hr tablet Take 15 mg by mouth daily.     risperiDONE (RISPERDAL) 0.5 MG tablet Take 0.5 mg by mouth at bedtime.     sertraline (ZOLOFT) 25 MG tablet Take 25 mg by mouth daily.     tamsulosin (FLOMAX) 0.4 MG CAPS capsule Take 0.4 mg by mouth daily.     traZODone (DESYREL) 50 MG tablet Take 2 tablets (100 mg total) by mouth at bedtime. 60 tablet 11   umeclidinium-vilanterol (ANORO ELLIPTA) 62.5-25 MCG/INH AEPB Inhale 1 puff into the lungs daily. 1 each 5   zinc sulfate 220 (50 Zn) MG capsule Take by mouth.     No current facility-administered medications on file prior to visit.    PAST MEDICAL HISTORY: Past Medical History:  Diagnosis Date   Anemia    Anxiety    Asthma    Deaf    Depression    Essential hypertension    GERD (gastroesophageal reflux disease)    History of migraine    History of stroke    Spastic hemiplegia, right MCA stroke April 2014 in [place]   Mixed hyperlipidemia    PAD (peripheral artery disease) ([place])    Sarcoidosis    Seizures ([place])    Last one over 2 yrs ago in 2018, controlled with med   Shingles    Type 2 diabetes mellitus ([place])     PAST SURGICAL HISTORY: Past Surgical History:  Procedure Laterality Date   AIR/FLUID EXCHANGE Left 10/04/2018   Procedure: Air/Fluid Exchange;  Surgeon: Bernarda Caffey, MD;  Location: [place];  Service: Ophthalmology;  Laterality: Left;   BRAVO [place] STUDY N/A 06/18/2013   pH STUDY SHOWS NEXIUM TWICE  DAILY CONTROLS THE ACID IN HIS STOMACH. HE HAD VERY FEWEPISODE OF REGURGITATION RECORDED IN THE 2 DAYS THE STUDY WAS PERFORMED.    COLONOSCOPY  2011   IN [place]   ESOPHAGOGASTRODUODENOSCOPY N/A 06/18/2013   Dr.Fields- probable proximal esophageal web,dilation performed, bravo cap placed, mild non-erosive gastritis in the gastric antrum and on the greater curvature of the gastric body  bx- granulomatous gastritis, duodenal mucosa showed no abnormalities in the bulb and second portion of the duodenum.    ESOPHAGOGASTRODUODENOSCOPY N/A 05/05/2015   Procedure: ESOPHAGOGASTRODUODENOSCOPY (EGD);  Surgeon: Danie Binder, MD;  [facility];  Service: Endoscopy;  Laterality: N/A;  730   INJECTION OF SILICONE OIL Left 5/40/0867   Procedure: Injection Of Silicone Oil;  Surgeon: Bernarda Caffey, MD;  Location: [place];  Service: Ophthalmology;  Laterality: Left;   LASER PHOTO ABLATION Left 10/04/2018   Procedure: Laser Photo Ablation;  Surgeon: Bernarda Caffey, MD;  Location: [place];  Service: Ophthalmology;  Laterality: Left;   MALONEY DILATION N/A 06/18/2013   Procedure: Venia Minks DILATION;  Surgeon: Danie Binder, MD;  [facility];  Service: Endoscopy;  Laterality: N/A;   OTHER SURGICAL HISTORY     cyst removal head and chest   PARS PLANA VITRECTOMY Left 10/04/2018   Procedure: PARS PLANA VITRECTOMY WITH 25 GAUGE;  Surgeon: Bernarda Caffey, MD;  Location: [place];  Service: Ophthalmology;  Laterality: Left;   POSTERIOR CERVICAL FUSION/FORAMINOTOMY N/A 10/07/2015   Procedure: Cervical Three-Four, Cervical Four-Five, Cervical Five-Six, Cervical Six-Seven Posterior Cervical Laminectomy and Fusion;  Surgeon: Kary Kos, MD;  Location: [place] [facility];  Service: Neurosurgery;  Laterality: N/A;   SAVORY DILATION N/A 06/18/2013   Procedure: SAVORY DILATION;  Surgeon: Danie Binder, MD;  [facility];  Service: Endoscopy;  Laterality: N/A;   TEE WITHOUT CARDIOVERSION N/A 10/04/2012   Procedure:  TRANSESOPHAGEAL ECHOCARDIOGRAM (TEE);  Surgeon: Thayer Headings, MD;  Location: [facility];  Service: Cardiovascular;  Laterality: N/A;   Cooper accident  Both legs fractured and repaired surgically    FAMILY HISTORY: Family History  Problem Relation Age of Onset   Diabetes Mother    Hypertension Mother    Glaucoma Mother    Retinal detachment Mother    Hypertension Father    Diabetes Brother    Glaucoma Brother    Colon polyps Neg Hx    Colon cancer Neg Hx     SOCIAL HISTORY:  Social History   Socioeconomic History   Marital status: Single    Spouse name: Not on file   Number of children: 0   Years of education: HS   Highest education level: Not on file  Occupational History   Occupation: Disabled  Tobacco Use   Smoking status: Former    Packs/day: 1.00    Years: 29.00    Pack years: 29.00    Types: Cigarettes    Quit date: 02/22/1992    Years since quitting: 28.5   Smokeless tobacco: Never  Vaping Use   Vaping Use: Never used  Substance and Sexual Activity   Alcohol use: Not Currently    Comment: occasionally beer   Drug use: Yes    Types: Marijuana, "Crack" cocaine    Comment: hx of crack cocaine "long time ago" per pt - hard to tell how long it is has been since he used,   Sexual activity: Not on file  Other Topics Concern   Not on file  Social History Narrative   Patient lives at home with his family. Brother, sister, mother    Patient does not work.    Patient has a high school education.    Patient has one step child    Social Determinants of Health   Financial Resource Strain: Not on file  Food Insecurity: Not on file  Transportation Needs: Not  on file  Physical Activity: Not on file  Stress: Not on file  Social Connections: Not on file  Intimate Partner Violence: Not on file     PHYSICAL EXAM   Vitals:   08/26/20 1258  BP: 124/78  Pulse: 67   Not recorded     There is no height or weight on file  to calculate BMI.   PHYSICAL EXAMNIATION:  PHYSICAL EXAMNIATION:  Gen: NAD, conversant, well nourised, well groomed           MENTAL STATUS: Speech/Cognition: Awake, alert,  deaf,   CRANIAL NERVES: CN II: Visual fields are full to confrontation.  Pupils are round equal and briskly reactive to light. CN III, IV, VI: extraocular movement are normal. No ptosis. CN V: Facial sensation is intact to light touch. CN VII:  mild left lower face weakness CN VIII: born deaft CN IX, X: Palate elevates symmetrically. Phonation is normal. CN XI: decreased left shoulder shrugging  MOTOR: Spastic left hemiparesis, no antigravity movement of left upper extremity, limited range of motion of left shoulder,  maximum 90 degree extension, improved spasticity of left pectoralis major, left latissimus dorsi, only complains of pain with maximum degree, tendency for left arm pronation, okay range of motion of left elbow, tends to hold left thumb in position, with passive movement, he has partial extension of fingers, tight left flexor forearm muscle tendons  He is able to raise left leg against gravity, left plantar foot pain, constant left 1st toe extension   REFLEXES: Hyperreflexia of left upper and lower extremities,  SENSORY: Withdrawal to pain bilaterally  COORDINATION: There is no trunk or limb ataxia.    GAIT/STANCE: Deferred    Lab Results  Component Value Date   WBC 4.5 03/12/2020   HGB 14.2 03/12/2020   HCT 43.1 03/12/2020   MCV 90.7 03/12/2020   PLT 116 (L) 03/12/2020      Component Value Date/Time   NA 141 03/12/2020 2230   NA 138 09/12/2019 1539   K 4.2 03/12/2020 2230   CL 103 03/12/2020 2230   CO2 29 03/12/2020 2230   GLUCOSE 121 (H) 03/12/2020 2230   BUN 13 03/12/2020 2230   BUN 13 09/12/2019 1539   CREATININE 1.18 03/12/2020 2230   CALCIUM 9.2 03/12/2020 2230   PROT 7.6 03/12/2020 2230   PROT 7.5 09/12/2019 1539   ALBUMIN 4.2 03/12/2020 2230   ALBUMIN 4.8  09/12/2019 1539   AST 24 03/12/2020 2230   ALT 22 03/12/2020 2230   ALKPHOS 63 03/12/2020 2230   BILITOT 0.3 03/12/2020 2230   BILITOT 0.6 09/12/2019 1539   GFRNONAA >60 03/12/2020 2230   GFRAA 74 09/12/2019 1539   Lab Results  Component Value Date   CHOL 160 09/13/2016   HDL 59 09/13/2016   LDLCALC 43 09/13/2016   TRIG 288 (H) 09/13/2016   CHOLHDL 2.7 09/13/2016   Lab Results  Component Value Date   HGBA1C 7.1 (H) 09/12/2019   Lab Results  Component Value Date   [id number] 419 03/01/2018   Lab Results  Component Value Date   TSH 3.490 09/12/2019      ASSESSMENT AND PLAN  Charles [name] is a 65 y.o. male  Congenital deaf, use sign language, history is through interpreter   History of cervical spondylitic myelopathy, presented with worsening gait abnormality  Status post cervical decompression surgery in August 2017.  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, is followed up by [place] ophthalmologist, on multiple eyedrops,  Left eye is blind now  Worsening headaches,  Improved now  History of stroke, with residual spastic left hemiparesis  Vascular risk factor of hypertension, hyperlipidemia,  keep Plavix daily  Botox injection for spastic left hemiparesis every 3 months Electrical stimulation guided xeomin injection, used 500 units   Left brachialis 50 units  Left pectoralis major 50 units left pronator teres 50  units Left flexor digitorum profundi 25x3=75 units Left flexor digitorum superficialis 50 units  Left opponents 50  units Left palmaris longus 50 units Left flexor carpi ulnaris 50 units Left flexor carpi radialis 25 units Left lumbricals 25 unitsx2=50 units      Marcial Pacas, M.D. Ph.D.  [facility] [address] [place], [place] [address] [phone]

## 2020-09-01 DIAGNOSIS — H2142 Pupillary membranes, left eye: Secondary | ICD-10-CM | POA: Diagnosis not present

## 2020-09-01 DIAGNOSIS — H4052X0 Glaucoma secondary to other eye disorders, left eye, stage unspecified: Secondary | ICD-10-CM | POA: Diagnosis not present

## 2020-09-02 DIAGNOSIS — H2142 Pupillary membranes, left eye: Secondary | ICD-10-CM | POA: Insufficient documentation

## 2020-10-23 ENCOUNTER — Other Ambulatory Visit: Payer: Self-pay

## 2020-10-23 ENCOUNTER — Ambulatory Visit (INDEPENDENT_AMBULATORY_CARE_PROVIDER_SITE_OTHER): Payer: Medicare Other | Admitting: Podiatry

## 2020-10-23 ENCOUNTER — Encounter: Payer: Self-pay | Admitting: Podiatry

## 2020-10-23 DIAGNOSIS — M79609 Pain in unspecified limb: Secondary | ICD-10-CM | POA: Diagnosis not present

## 2020-10-23 DIAGNOSIS — E119 Type 2 diabetes mellitus without complications: Secondary | ICD-10-CM | POA: Diagnosis not present

## 2020-10-23 DIAGNOSIS — B351 Tinea unguium: Secondary | ICD-10-CM | POA: Diagnosis not present

## 2020-10-27 NOTE — Progress Notes (Signed)
Subjective: Charles [name] presents today for preventative diabetic foot care and painful thick toenails that are difficult to trim. Pain interferes with ambulation. Aggravating factors include wearing enclosed shoe gear. Pain is relieved with periodic professional debridement.   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 His brother, Charles [name], is also present during today's visit.   Charles [name] relates concern of "knot" on left leg which presented recently. He nor Charles [name] note any preceding episode of trauma before appearance of the knot. Mr. Dupin notes no pain on left leg.  Patient's blood sugar was 138 mg/dl on yesterday.  Charles Blitz, MD is patient's PCP. Last visit was 02/24/2020.  Review of Systems: Negative except as noted in the HPI.   Objective: There were no vitals filed for this visit.  [name] is a pleasant 65 y.o. male in NAD. AAO X 3.  Vascular Examination: Capillary refill time to digits immediate b/l. Palpable DP pulse(s) right lower extremity Palpable PT pulse(s) b/l lower extremities Faintly palpable DP pulse(s) left lower extremity. Pedal hair present. Lower extremity skin temperature gradient within normal limits.  Dermatological Examination: Pedal skin with normal turgor, texture and tone bilaterally. No open wounds bilaterally. No interdigital macerations bilaterally. Toenails 1-5 b/l elongated, discolored, dystrophic, thickened, crumbly with subungual debris and tenderness to dorsal palpation. Incurvated nailplate medial border(s) L hallux.  Nail border hypertrophy minimal. There is tenderness to palpation. Sign(s) of infection: no clinical signs of infection noted on examination today..  Musculoskeletal Examination: Muscle strength 5/5 to all LE muscle groups of right lower extremity. Dropfoot left foot. Hallux valgus with bunion deformity noted b/l lower extremities. No palpable mass discovered on examination  of left leg today.  Neurological Examination: Protective sensation intact 5/5 intact bilaterally with 10g monofilament b/l. Vibratory sensation intact b/l.  Assessment: 1. Pain due to onychomycosis of nail   2. Diabetes mellitus without complication ([place])     Plan: -No new findings. No new orders. -No palpable mass discovered on LLE on today's examination. Brother, Charles [name], informed if it reappears, follow up with PCP for further workup. He related understanding. -Continue diabetic foot care principles: inspect feet daily, monitor glucose as recommended by PCP and/or Endocrinologist, and follow prescribed diet per PCP, Endocrinologist and/or dietician. -Patient to continue soft, supportive shoe gear daily. -Toenails 1-5 b/l were debrided in length and girth with sterile nail nippers and dremel without iatrogenic bleeding.  -Patient to report any pedal injuries to medical professional immediately. -Patient/POA to call should there be question/concern in the interim.  Return in about 3 months (around 01/22/2021).  Marzetta Board, DPM

## 2020-11-11 DIAGNOSIS — Z23 Encounter for immunization: Secondary | ICD-10-CM | POA: Diagnosis not present

## 2020-11-11 DIAGNOSIS — L0390 Cellulitis, unspecified: Secondary | ICD-10-CM | POA: Diagnosis not present

## 2020-11-11 DIAGNOSIS — J42 Unspecified chronic bronchitis: Secondary | ICD-10-CM | POA: Diagnosis not present

## 2020-11-11 DIAGNOSIS — E1165 Type 2 diabetes mellitus with hyperglycemia: Secondary | ICD-10-CM | POA: Diagnosis not present

## 2020-11-11 DIAGNOSIS — I10 Essential (primary) hypertension: Secondary | ICD-10-CM | POA: Diagnosis not present

## 2020-11-11 DIAGNOSIS — Z299 Encounter for prophylactic measures, unspecified: Secondary | ICD-10-CM | POA: Diagnosis not present

## 2020-12-02 ENCOUNTER — Ambulatory Visit (INDEPENDENT_AMBULATORY_CARE_PROVIDER_SITE_OTHER): Payer: Medicare Other | Admitting: Neurology

## 2020-12-02 ENCOUNTER — Encounter: Payer: Self-pay | Admitting: Neurology

## 2020-12-02 VITALS — BP 159/92 | HR 72

## 2020-12-02 DIAGNOSIS — F32A Depression, unspecified: Secondary | ICD-10-CM | POA: Diagnosis not present

## 2020-12-02 DIAGNOSIS — I63311 Cerebral infarction due to thrombosis of right middle cerebral artery: Secondary | ICD-10-CM

## 2020-12-02 DIAGNOSIS — G40909 Epilepsy, unspecified, not intractable, without status epilepticus: Secondary | ICD-10-CM

## 2020-12-02 DIAGNOSIS — I69398 Other sequelae of cerebral infarction: Secondary | ICD-10-CM | POA: Diagnosis not present

## 2020-12-02 DIAGNOSIS — G8114 Spastic hemiplegia affecting left nondominant side: Secondary | ICD-10-CM | POA: Diagnosis not present

## 2020-12-02 DIAGNOSIS — Z79899 Other long term (current) drug therapy: Secondary | ICD-10-CM

## 2020-12-02 DIAGNOSIS — M7989 Other specified soft tissue disorders: Secondary | ICD-10-CM

## 2020-12-02 DIAGNOSIS — G8929 Other chronic pain: Secondary | ICD-10-CM | POA: Diagnosis not present

## 2020-12-02 DIAGNOSIS — R519 Headache, unspecified: Secondary | ICD-10-CM | POA: Diagnosis not present

## 2020-12-02 MED ORDER — INCOBOTULINUMTOXINA 100 UNITS IM SOLR
500.0000 [IU] | INTRAMUSCULAR | Status: AC
  Administered 2020-12-02: 500 [IU] via INTRAMUSCULAR

## 2020-12-02 MED ORDER — TRAZODONE HCL 50 MG PO TABS: 100.0000 mg | ORAL_TABLET | Freq: Every day | ORAL | 11 refills | Status: DC

## 2020-12-02 NOTE — Progress Notes (Signed)
**  [name] 100 units x 2 vials, NDC 4210-3128-11, Lot [id number], Exp 10/2022, office supply.//mck,rn**
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
## 2020-12-07 ENCOUNTER — Ambulatory Visit (HOSPITAL_COMMUNITY)
Admission: RE | Admit: 2020-12-07 | Discharge: 2020-12-07 | Disposition: A | Discharge status: Home / Self Care | Payer: Medicare Other | Source: Ambulatory Visit | Attending: Neurology | Admitting: Neurology

## 2020-12-07 ENCOUNTER — Other Ambulatory Visit: Payer: Self-pay

## 2020-12-07 ENCOUNTER — Telehealth: Payer: Self-pay | Admitting: Neurology

## 2020-12-07 DIAGNOSIS — F32A Depression, unspecified: Secondary | ICD-10-CM | POA: Diagnosis not present

## 2020-12-07 DIAGNOSIS — I63311 Cerebral infarction due to thrombosis of right middle cerebral artery: Secondary | ICD-10-CM | POA: Insufficient documentation

## 2020-12-07 DIAGNOSIS — G40909 Epilepsy, unspecified, not intractable, without status epilepticus: Secondary | ICD-10-CM | POA: Insufficient documentation

## 2020-12-07 DIAGNOSIS — M79605 Pain in left leg: Secondary | ICD-10-CM | POA: Diagnosis not present

## 2020-12-07 DIAGNOSIS — G8929 Other chronic pain: Secondary | ICD-10-CM | POA: Insufficient documentation

## 2020-12-07 DIAGNOSIS — I69398 Other sequelae of cerebral infarction: Secondary | ICD-10-CM | POA: Diagnosis not present

## 2020-12-07 DIAGNOSIS — Z79899 Other long term (current) drug therapy: Secondary | ICD-10-CM | POA: Insufficient documentation

## 2020-12-07 DIAGNOSIS — R519 Headache, unspecified: Secondary | ICD-10-CM | POA: Insufficient documentation

## 2020-12-07 DIAGNOSIS — G8114 Spastic hemiplegia affecting left nondominant side: Secondary | ICD-10-CM | POA: Insufficient documentation

## 2020-12-07 DIAGNOSIS — M7989 Other specified soft tissue disorders: Secondary | ICD-10-CM | POA: Diagnosis not present

## 2020-12-07 DIAGNOSIS — R600 Localized edema: Secondary | ICD-10-CM | POA: Diagnosis not present

## 2020-12-07 NOTE — Telephone Encounter (Signed)
Negative for deep venous thrombosis in left lower extremity  Please call patient, Doppler study showed no evidence of left lower extremity DVT

## 2020-12-07 NOTE — Telephone Encounter (Signed)
Pt's brother called back and per the request of Sharyn Lull, RN the results were relayed.  Brother had no further questions, this is [place]

## 2020-12-07 NOTE — Telephone Encounter (Signed)
Left patient a detailed message, with results, on brother's voicemail (ok per DPR).  Provided our number to call back with any questions.

## 2021-01-22 ENCOUNTER — Ambulatory Visit: Payer: Medicare Other | Admitting: Podiatry

## 2021-02-04 DIAGNOSIS — R5383 Other fatigue: Secondary | ICD-10-CM | POA: Diagnosis not present

## 2021-02-04 DIAGNOSIS — Z125 Encounter for screening for malignant neoplasm of prostate: Secondary | ICD-10-CM | POA: Diagnosis not present

## 2021-02-04 DIAGNOSIS — Z1339 Encounter for screening examination for other mental health and behavioral disorders: Secondary | ICD-10-CM | POA: Diagnosis not present

## 2021-02-04 DIAGNOSIS — Z7189 Other specified counseling: Secondary | ICD-10-CM | POA: Diagnosis not present

## 2021-02-04 DIAGNOSIS — Z79899 Other long term (current) drug therapy: Secondary | ICD-10-CM | POA: Diagnosis not present

## 2021-02-04 DIAGNOSIS — Z6828 Body mass index (BMI) 28.0-28.9, adult: Secondary | ICD-10-CM | POA: Diagnosis not present

## 2021-02-04 DIAGNOSIS — Z1331 Encounter for screening for depression: Secondary | ICD-10-CM | POA: Diagnosis not present

## 2021-02-04 DIAGNOSIS — I10 Essential (primary) hypertension: Secondary | ICD-10-CM | POA: Diagnosis not present

## 2021-02-04 DIAGNOSIS — Z299 Encounter for prophylactic measures, unspecified: Secondary | ICD-10-CM | POA: Diagnosis not present

## 2021-02-04 DIAGNOSIS — Z0000 Encounter for general adult medical examination without abnormal findings: Secondary | ICD-10-CM | POA: Diagnosis not present

## 2021-02-04 DIAGNOSIS — E7800 Pure hypercholesterolemia, unspecified: Secondary | ICD-10-CM | POA: Diagnosis not present

## 2021-02-23 ENCOUNTER — Telehealth: Payer: Self-pay | Admitting: Neurology

## 2021-02-23 NOTE — Telephone Encounter (Signed)
Patient is scheduled for a [name] injection tomorrow. Patient now has [place] as primary and medicaid as secondary. I attempted PA via [facility] portal for 500 units of [name] for dx G81.14. Uploaded notes. Request is pending. Pending PA #[id number].

## 2021-02-24 ENCOUNTER — Ambulatory Visit (INDEPENDENT_AMBULATORY_CARE_PROVIDER_SITE_OTHER): Payer: Commercial Managed Care - HMO | Admitting: Neurology

## 2021-02-24 ENCOUNTER — Encounter: Payer: Self-pay | Admitting: Neurology

## 2021-02-24 VITALS — BP 131/76 | HR 72 | Ht 64.0 in | Wt 154.0 lb

## 2021-02-24 DIAGNOSIS — G8114 Spastic hemiplegia affecting left nondominant side: Secondary | ICD-10-CM

## 2021-02-24 DIAGNOSIS — I63311 Cerebral infarction due to thrombosis of right middle cerebral artery: Secondary | ICD-10-CM

## 2021-02-24 MED ORDER — INCOBOTULINUMTOXINA 100 UNITS IM SOLR
500.0000 [IU] | INTRAMUSCULAR | Status: AC
  Administered 2021-02-24: 500 [IU] via INTRAMUSCULAR
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
## 2021-02-24 NOTE — Progress Notes (Signed)
[name] -500 units Ndc- [id number] [phone] Exp-01/2023 B/B

## 2021-02-24 NOTE — Telephone Encounter (Signed)
Received approval from [facility]. PA #[id number] (02/23/21- 02/23/22).

## 2021-03-09 ENCOUNTER — Encounter: Payer: Self-pay | Admitting: Podiatry

## 2021-03-09 ENCOUNTER — Ambulatory Visit (INDEPENDENT_AMBULATORY_CARE_PROVIDER_SITE_OTHER): Payer: Commercial Managed Care - HMO | Admitting: Podiatry

## 2021-03-09 ENCOUNTER — Other Ambulatory Visit: Payer: Self-pay

## 2021-03-09 DIAGNOSIS — R32 Unspecified urinary incontinence: Secondary | ICD-10-CM | POA: Insufficient documentation

## 2021-03-09 DIAGNOSIS — J069 Acute upper respiratory infection, unspecified: Secondary | ICD-10-CM | POA: Insufficient documentation

## 2021-03-09 DIAGNOSIS — M79609 Pain in unspecified limb: Secondary | ICD-10-CM

## 2021-03-09 DIAGNOSIS — N281 Cyst of kidney, acquired: Secondary | ICD-10-CM | POA: Insufficient documentation

## 2021-03-09 DIAGNOSIS — B351 Tinea unguium: Secondary | ICD-10-CM | POA: Diagnosis not present

## 2021-03-09 DIAGNOSIS — E559 Vitamin D deficiency, unspecified: Secondary | ICD-10-CM | POA: Insufficient documentation

## 2021-03-09 DIAGNOSIS — K2100 Gastro-esophageal reflux disease with esophagitis, without bleeding: Secondary | ICD-10-CM | POA: Insufficient documentation

## 2021-03-09 DIAGNOSIS — R600 Localized edema: Secondary | ICD-10-CM | POA: Insufficient documentation

## 2021-03-09 DIAGNOSIS — M79605 Pain in left leg: Secondary | ICD-10-CM | POA: Insufficient documentation

## 2021-03-09 DIAGNOSIS — R682 Dry mouth, unspecified: Secondary | ICD-10-CM | POA: Insufficient documentation

## 2021-03-09 DIAGNOSIS — M79671 Pain in right foot: Secondary | ICD-10-CM | POA: Insufficient documentation

## 2021-03-09 DIAGNOSIS — G4700 Insomnia, unspecified: Secondary | ICD-10-CM | POA: Insufficient documentation

## 2021-03-09 DIAGNOSIS — M62838 Other muscle spasm: Secondary | ICD-10-CM | POA: Insufficient documentation

## 2021-03-09 DIAGNOSIS — M7500 Adhesive capsulitis of unspecified shoulder: Secondary | ICD-10-CM | POA: Insufficient documentation

## 2021-03-09 DIAGNOSIS — Z23 Encounter for immunization: Secondary | ICD-10-CM | POA: Insufficient documentation

## 2021-03-09 DIAGNOSIS — R5383 Other fatigue: Secondary | ICD-10-CM | POA: Insufficient documentation

## 2021-03-09 DIAGNOSIS — M25519 Pain in unspecified shoulder: Secondary | ICD-10-CM | POA: Insufficient documentation

## 2021-03-09 DIAGNOSIS — I679 Cerebrovascular disease, unspecified: Secondary | ICD-10-CM | POA: Insufficient documentation

## 2021-03-09 DIAGNOSIS — Z87891 Personal history of nicotine dependence: Secondary | ICD-10-CM | POA: Insufficient documentation

## 2021-03-09 DIAGNOSIS — R350 Frequency of micturition: Secondary | ICD-10-CM | POA: Insufficient documentation

## 2021-03-09 DIAGNOSIS — E1165 Type 2 diabetes mellitus with hyperglycemia: Secondary | ICD-10-CM | POA: Insufficient documentation

## 2021-03-12 DIAGNOSIS — Z789 Other specified health status: Secondary | ICD-10-CM | POA: Diagnosis not present

## 2021-03-12 DIAGNOSIS — E1165 Type 2 diabetes mellitus with hyperglycemia: Secondary | ICD-10-CM | POA: Diagnosis not present

## 2021-03-12 DIAGNOSIS — I10 Essential (primary) hypertension: Secondary | ICD-10-CM | POA: Diagnosis not present

## 2021-03-12 DIAGNOSIS — Z299 Encounter for prophylactic measures, unspecified: Secondary | ICD-10-CM | POA: Diagnosis not present

## 2021-03-12 DIAGNOSIS — I739 Peripheral vascular disease, unspecified: Secondary | ICD-10-CM | POA: Diagnosis not present

## 2021-03-14 NOTE — Progress Notes (Signed)
°  Subjective:  Patient ID: Charles [name], male    DOB: September 17, 1955,  MRN: [id number]  Charles CHOPLIN presents to clinic today for preventative diabetic foot care and painful elongated mycotic toenails 1-5 bilaterally which are tender when wearing enclosed shoe gear. Pain is relieved with periodic professional debridement.  He is accompanied by his brother, Mr. Jarrette Dehner and sign language interpreter, Ms. Henrene Hawking.  Patient relates pain of left hallux for the past couple of days. Denies any preceding injury to to. He states it is sensitive to touch. Denies any redness, swelling or drainage.   Patient did not check blood glucose today.  PCP is [name], MD , and last visit was 07/02/2020.  Allergies  Allergen Reactions   Iodinated Contrast Media Itching and Anaphylaxis   Iodine-131 Itching and Swelling   Shellfish-Derived Products Itching   Codeine Itching    Other reaction(s): Unknown   Hydrocodone Other (See Comments)    Makes "looney" Other reaction(s): Other Makes "looney"   Oxycodone Nausea And Vomiting and Itching   Shellfish Allergy Hives and Swelling   Metformin Diarrhea    Review of Systems: Negative except as noted in the HPI. Objective:   Constitutional Charles [name] is a pleasant 66 y.o. African American male, WD, WN in NAD. AAO x 3.   Vascular Palpable PT pulse(s) b/l LE. Palpable DP pulse(s) right lower extremity Faintly palpable DP pulse(s) LLE. Pedal hair present. No pain with calf compression b/l. Lower extremity skin temperature gradient within normal limits. No edema noted b/l LE.  Neurologic Normal speech. Oriented to person, place, and time. Protective sensation intact 5/5 intact bilaterally with 10g monofilament b/l. Vibratory sensation intact b/l.  Dermatologic Pedal skin warm and supple b/l.  No open wounds b/l. No interdigital macerations. Toenails 1-5 b/l elongated, thickened, discolored with subungual debris. +Tenderness with dorsal palpation of  nailplates.No hyperkeratotic nor porokeratotic lesions noted b/l. Incurvated nailplate medial  border(s) L hallux.  Nail border hypertrophy absent. There is tenderness to palpation. Sign(s) of infection: no clinical signs of infection noted on examination today..  Orthopedic: Dropfoot left lower extremity. Muscle strength 5/5 to all LE muscle groups of right lower extremity. HAV with bunion deformity noted b/l LE.   Radiographs: None Assessment:   1. Pain due to onychomycosis of nail    Plan:  Patient was evaluated and treated and all questions answered. Consent given for treatment as described below: -Continue foot and shoe inspections daily. Monitor blood glucose per PCP/Endocrinologist's recommendations. -Mycotic toenails 1-5 bilaterally were debrided in length and girth with sterile nail nippers and dremel without incident. -Offending nail border debrided and curretaged L hallux utilizing sterile nail nipper and currette. Border cleansed with alcohol and triple antibiotic applied. No further treatment required by patient/caregiver. -Patient/POA to call should there be question/concern in the interim.  Return in about 3 months (around 06/07/2021).  Marzetta Board, DPM

## 2021-03-17 DIAGNOSIS — H359 Unspecified retinal disorder: Secondary | ICD-10-CM | POA: Diagnosis not present

## 2021-03-17 DIAGNOSIS — H3589 Other specified retinal disorders: Secondary | ICD-10-CM | POA: Diagnosis not present

## 2021-04-20 DIAGNOSIS — E7800 Pure hypercholesterolemia, unspecified: Secondary | ICD-10-CM | POA: Diagnosis not present

## 2021-04-20 DIAGNOSIS — I10 Essential (primary) hypertension: Secondary | ICD-10-CM | POA: Diagnosis not present

## 2021-05-19 ENCOUNTER — Ambulatory Visit: Payer: Commercial Managed Care - HMO | Admitting: Neurology

## 2021-05-20 ENCOUNTER — Ambulatory Visit: Payer: Commercial Managed Care - HMO | Admitting: Neurology

## 2021-05-26 DIAGNOSIS — Z79899 Other long term (current) drug therapy: Secondary | ICD-10-CM | POA: Diagnosis not present

## 2021-05-26 DIAGNOSIS — I10 Essential (primary) hypertension: Secondary | ICD-10-CM | POA: Diagnosis not present

## 2021-05-26 DIAGNOSIS — E7800 Pure hypercholesterolemia, unspecified: Secondary | ICD-10-CM | POA: Diagnosis not present

## 2021-05-26 DIAGNOSIS — Z299 Encounter for prophylactic measures, unspecified: Secondary | ICD-10-CM | POA: Diagnosis not present

## 2021-05-26 DIAGNOSIS — R5383 Other fatigue: Secondary | ICD-10-CM | POA: Diagnosis not present

## 2021-05-26 DIAGNOSIS — Z7189 Other specified counseling: Secondary | ICD-10-CM | POA: Diagnosis not present

## 2021-05-26 DIAGNOSIS — Z0000 Encounter for general adult medical examination without abnormal findings: Secondary | ICD-10-CM | POA: Diagnosis not present

## 2021-05-26 DIAGNOSIS — Z789 Other specified health status: Secondary | ICD-10-CM | POA: Diagnosis not present

## 2021-05-26 DIAGNOSIS — Z1211 Encounter for screening for malignant neoplasm of colon: Secondary | ICD-10-CM | POA: Diagnosis not present

## 2021-06-02 DIAGNOSIS — R5383 Other fatigue: Secondary | ICD-10-CM | POA: Diagnosis not present

## 2021-06-02 DIAGNOSIS — E7800 Pure hypercholesterolemia, unspecified: Secondary | ICD-10-CM | POA: Diagnosis not present

## 2021-06-02 DIAGNOSIS — Z79899 Other long term (current) drug therapy: Secondary | ICD-10-CM | POA: Diagnosis not present

## 2021-06-09 DIAGNOSIS — I10 Essential (primary) hypertension: Secondary | ICD-10-CM | POA: Diagnosis not present

## 2021-06-09 DIAGNOSIS — Z299 Encounter for prophylactic measures, unspecified: Secondary | ICD-10-CM | POA: Diagnosis not present

## 2021-06-09 DIAGNOSIS — Z789 Other specified health status: Secondary | ICD-10-CM | POA: Diagnosis not present

## 2021-06-11 ENCOUNTER — Ambulatory Visit (INDEPENDENT_AMBULATORY_CARE_PROVIDER_SITE_OTHER): Payer: Medicare Other | Admitting: Podiatry

## 2021-06-11 ENCOUNTER — Encounter: Payer: Self-pay | Admitting: Podiatry

## 2021-06-11 DIAGNOSIS — M79609 Pain in unspecified limb: Secondary | ICD-10-CM | POA: Diagnosis not present

## 2021-06-11 DIAGNOSIS — B351 Tinea unguium: Secondary | ICD-10-CM

## 2021-06-20 NOTE — Progress Notes (Signed)
?  Subjective:  ?Patient ID: Charles [name], male    DOB: April 25, 1955,  MRN: [id number] ? ?Charles [name] presents to clinic today for preventative diabetic foot care and painful elongated mycotic toenails 1-5 bilaterally which are tender when wearing enclosed shoe gear. Pain is relieved with periodic professional debridement. ? ?Patient is accompanied by his brother, [name], and ASL interpreter, Ms. Charles [name] Mr. Bhat noted no new pedal problems on today's visit. ? ?Last known HgA1c was 6.7%. ? ?PCP is [name], MD , and last visit was February 04, 2021. ? ?Allergies  ?Allergen Reactions  ? Iodinated Contrast Media Itching and Anaphylaxis  ? Iodine-131 Itching and Swelling  ? Shellfish-Derived Products Itching  ? Codeine Itching  ?  Other reaction(s): Unknown  ? Hydrocodone Other (See Comments)  ?  Makes "looney" ?Other reaction(s): Other ?Makes "looney"  ? Oxycodone Nausea And Vomiting and Itching  ? Shellfish Allergy Hives and Swelling  ? Metformin Diarrhea  ? ? ?Review of Systems: Negative except as noted in the HPI. ? ?Objective: No changes noted in today's physical examination. ? ?Constitutional [name] is a pleasant 66 y.o. African American male, WD, WN in NAD. AAO x 3.   ?Vascular Palpable PT pulse(s) b/l LE. Palpable DP pulse(s) right lower extremity Faintly palpable DP pulse(s) LLE. Pedal hair present. No pain with calf compression b/l. Lower extremity skin temperature gradient within normal limits. No edema noted b/l LE.  ?Neurologic Normal speech. Oriented to person, place, and time. Protective sensation intact 5/5 intact bilaterally with 10g monofilament b/l. Vibratory sensation intact b/l.  ?Dermatologic Pedal skin warm and supple b/l.  No open wounds b/l. No interdigital macerations. Toenails 1-5 b/l elongated, thickened, discolored with subungual debris. +Tenderness with dorsal palpation of nailplates.No hyperkeratotic nor porokeratotic lesions noted b/l. Incurvated nailplate  medial  border(s) L hallux.  Nail border hypertrophy absent. There is tenderness to palpation. Sign(s) of infection: no clinical signs of infection noted on examination today.  ?Orthopedic: Dropfoot left lower extremity. Muscle strength 5/5 to all LE muscle groups of right lower extremity. HAV with bunion deformity noted b/l LE.  ? ?Radiographs: None ? ?Assessment/Plan: ?1. Pain due to onychomycosis of nail   ?-Examined patient. ?-Patient to continue soft, supportive shoe gear daily. ?-Toenails 1-5 b/l were debrided in length and girth with sterile nail nippers and dremel without iatrogenic bleeding.  ?-Patient/POA to call should there be question/concern in the interim.  ? ?Return in about 3 months (around 09/10/2021). ? ?Marzetta Board, DPM  ?

## 2021-06-29 DIAGNOSIS — R350 Frequency of micturition: Secondary | ICD-10-CM | POA: Diagnosis not present

## 2021-06-29 DIAGNOSIS — R3915 Urgency of urination: Secondary | ICD-10-CM | POA: Diagnosis not present

## 2021-08-18 ENCOUNTER — Telehealth: Payer: Self-pay | Admitting: Neurology

## 2021-08-18 NOTE — Telephone Encounter (Signed)
Pt's brother states someone called him but did not leave a message.  Pt's brother was advised by phone rep that there was an appointment that was cx for 07-03, he is asking for a call to r/s for pt.

## 2021-08-18 NOTE — Telephone Encounter (Signed)
I spoke to the patient's brother. [name] appt moved to 09/01/21 due to provider being out of the office. He is okay with the new date and time.

## 2021-08-23 ENCOUNTER — Ambulatory Visit: Payer: Medicare Other | Admitting: Neurology

## 2021-09-01 ENCOUNTER — Ambulatory Visit (INDEPENDENT_AMBULATORY_CARE_PROVIDER_SITE_OTHER): Payer: Medicare Other | Admitting: Neurology

## 2021-09-01 ENCOUNTER — Encounter: Payer: Self-pay | Admitting: Neurology

## 2021-09-01 VITALS — BP 137/102 | HR 73 | Ht 64.0 in | Wt 162.0 lb

## 2021-09-01 DIAGNOSIS — I63311 Cerebral infarction due to thrombosis of right middle cerebral artery: Secondary | ICD-10-CM

## 2021-09-01 DIAGNOSIS — G8114 Spastic hemiplegia affecting left nondominant side: Secondary | ICD-10-CM | POA: Diagnosis not present
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
## 2021-09-01 NOTE — Progress Notes (Unsigned)
[name] 100 units x 5 vials [id number] [phone]  Exp-2025-06

## 2021-09-02 DIAGNOSIS — G8114 Spastic hemiplegia affecting left nondominant side: Secondary | ICD-10-CM

## 2021-09-02 DIAGNOSIS — I63311 Cerebral infarction due to thrombosis of right middle cerebral artery: Secondary | ICD-10-CM | POA: Diagnosis not present

## 2021-09-02 MED ORDER — INCOBOTULINUMTOXINA 100 UNITS IM SOLR
500.0000 [IU] | INTRAMUSCULAR | Status: AC
Start: 2021-09-03 — End: ?
  Administered 2021-09-02: 500 [IU] via INTRAMUSCULAR

## 2021-09-22 ENCOUNTER — Ambulatory Visit (INDEPENDENT_AMBULATORY_CARE_PROVIDER_SITE_OTHER): Payer: Medicare Other | Admitting: Podiatry

## 2021-09-22 ENCOUNTER — Encounter: Payer: Self-pay | Admitting: Podiatry

## 2021-09-22 DIAGNOSIS — M79609 Pain in unspecified limb: Secondary | ICD-10-CM

## 2021-09-22 DIAGNOSIS — B351 Tinea unguium: Secondary | ICD-10-CM

## 2021-09-22 DIAGNOSIS — E119 Type 2 diabetes mellitus without complications: Secondary | ICD-10-CM | POA: Diagnosis not present

## 2021-09-27 NOTE — Progress Notes (Signed)
  Subjective:  Patient ID: Charles [name], male    DOB: 27-Mar-1955,  MRN: [id number]  Charles [name] presents to clinic today for preventative diabetic foot care and painful thick toenails that are difficult to trim. Pain interferes with ambulation. Aggravating factors include wearing enclosed shoe gear. Pain is relieved with periodic professional debridement.  Patient states blood glucose was 156 mg/dl today.  Last A1c was 6.7%.  New problem(s): None.   Patient's brother, [name], as well as an ASL interpreter were present during the visit.  PCP is [name], MD , and last visit was  June 09, 2021  Allergies  Allergen Reactions   Iodinated Contrast Media Itching and Anaphylaxis   Iodine-131 Itching and Swelling   Shellfish-Derived Products Itching   Codeine Itching    Other reaction(s): Unknown   Hydrocodone Other (See Comments)    Makes "looney" Other reaction(s): Other Makes "looney"   Oxycodone Nausea And Vomiting and Itching   Shellfish Allergy Hives and Swelling   Metformin Diarrhea    Review of Systems: Negative except as noted in the HPI.  Objective:  Constitutional Charles [name] is a pleasant 66 y.o. African American male, WD, WN in NAD. AAO x 3.   Vascular Palpable PT pulse(s) b/l LE. Palpable DP pulse(s) right lower extremity Faintly palpable DP pulse(s) LLE. Pedal hair present. No pain with calf compression b/l. Lower extremity skin temperature gradient within normal limits. No edema noted b/l LE.  Neurologic Normal speech. Oriented to person, place, and time. Protective sensation intact 5/5 intact bilaterally with 10g monofilament b/l. Vibratory sensation intact b/l.  Dermatologic Pedal skin warm and supple b/l.  No open wounds b/l. No interdigital macerations. Toenails 1-5 b/l elongated, thickened, discolored with subungual debris. +Tenderness with dorsal palpation of nailplates.No hyperkeratotic nor porokeratotic lesions noted b/l.   Orthopedic:  Dropfoot left lower extremity. Muscle strength 5/5 to all LE muscle groups of right lower extremity. HAV with bunion deformity noted b/l LE.   Radiographs: None  Assessment/Plan: 1. Pain due to onychomycosis of nail   2. Diabetes mellitus without complication ([place])      -Patient was evaluated and treated. All patient's and/or POA's questions/concerns answered on today's visit. -Patient to continue soft, supportive shoe gear daily. -Mycotic toenails 1-5 bilaterally were debrided in length and girth with sterile nail nippers and dremel without incident. -Patient/POA to call should there be question/concern in the interim.   Return in about 3 months (around 12/23/2021).  Marzetta Board, DPM

## 2021-11-22 DIAGNOSIS — Z23 Encounter for immunization: Secondary | ICD-10-CM | POA: Diagnosis not present

## 2021-12-08 ENCOUNTER — Ambulatory Visit: Payer: Medicare Other | Admitting: Neurology

## 2021-12-29 ENCOUNTER — Ambulatory Visit (INDEPENDENT_AMBULATORY_CARE_PROVIDER_SITE_OTHER): Payer: Medicare Other | Admitting: Neurology

## 2021-12-29 ENCOUNTER — Encounter: Payer: Self-pay | Admitting: Neurology

## 2021-12-29 VITALS — BP 122/85 | HR 84

## 2021-12-29 DIAGNOSIS — G8114 Spastic hemiplegia affecting left nondominant side: Secondary | ICD-10-CM | POA: Diagnosis not present

## 2021-12-29 DIAGNOSIS — I63311 Cerebral infarction due to thrombosis of right middle cerebral artery: Secondary | ICD-10-CM | POA: Diagnosis not present

## 2021-12-29 MED ORDER — INCOBOTULINUMTOXINA 100 UNITS IM SOLR
100.0000 [IU] | Freq: Once | INTRAMUSCULAR | Status: AC
  Administered 2021-12-29: 100 [IU] via INTRAMUSCULAR

## 2021-12-29 MED ORDER — INCOBOTULINUMTOXINA 100 UNITS IM SOLR: 500.0000 [IU] | INTRAMUSCULAR | Status: DC
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
## 2021-12-29 NOTE — Progress Notes (Signed)
[name] 100 units x 5 vials [id number] Exp-10/2023 B/B

## 2022-01-03 ENCOUNTER — Ambulatory Visit (INDEPENDENT_AMBULATORY_CARE_PROVIDER_SITE_OTHER): Payer: Medicare Other | Admitting: Podiatry

## 2022-01-03 DIAGNOSIS — B353 Tinea pedis: Secondary | ICD-10-CM

## 2022-01-03 DIAGNOSIS — M79609 Pain in unspecified limb: Secondary | ICD-10-CM

## 2022-01-03 DIAGNOSIS — B351 Tinea unguium: Secondary | ICD-10-CM

## 2022-01-03 DIAGNOSIS — E119 Type 2 diabetes mellitus without complications: Secondary | ICD-10-CM

## 2022-01-03 MED ORDER — KETOCONAZOLE 2 % EX CREA: TOPICAL_CREAM | CUTANEOUS | 1 refills | Status: DC

## 2022-01-03 NOTE — Progress Notes (Signed)
  Subjective:  Patient ID: Charles [name], male    DOB: 1955-06-08,  MRN: [id number]  Charles [name] presents to clinic today for preventative diabetic foot care and painful elongated mycotic toenails 1-5 bilaterally which are tender when wearing enclosed shoe gear. Pain is relieved with periodic professional debridement.  Chief Complaint  Patient presents with   Nail Problem    Diabetic foot care BS-did not check today A1C-5.6 PCP-Charles [name] PCP VST-4 Weeks ago   New problem(s): None.   PCP is Charles Blitz, MD , and last visit was June 09, 2021.  Allergies  Allergen Reactions   Iodinated Contrast Media Itching and Anaphylaxis   Iodine-131 Itching and Swelling   Shellfish-Derived Products Itching   Codeine Itching    Other reaction(s): Unknown   Hydrocodone Other (See Comments)    Makes "looney" Other reaction(s): Other Makes "looney"   Oxycodone Nausea And Vomiting and Itching   Shellfish Allergy Hives and Swelling   Metformin Diarrhea    Review of Systems: Negative except as noted in the HPI.  Objective: No changes noted in today's physical examination.  Charles [name] is a pleasant 66 y.o. male WD, WN in NAD. AAO x 3. Vascular Palpable PT pulse(s) b/l LE. Palpable DP pulse(s) right lower extremity Faintly palpable DP pulse(s) LLE. Pedal hair present. No pain with calf compression b/l. Lower extremity skin temperature gradient within normal limits. No edema noted b/l LE.  Neurologic Normal speech. Oriented to person, place, and time. Protective sensation intact 5/5 intact bilaterally with 10g monofilament b/l. Vibratory sensation intact b/l.  Dermatologic Pedal skin warm and supple b/l.  No open wounds b/l. No interdigital macerations. Toenails 1-5 b/l elongated, thickened, discolored with subungual debris. +Tenderness with dorsal palpation of nailplates.No hyperkeratotic nor porokeratotic lesions noted b/l.   Diffuse scaling noted peripherally and plantarly b/l feet.  No  interdigital macerations.  No blisters, no weeping. No signs of secondary bacterial infection noted.   Orthopedic: Dropfoot left lower extremity. Muscle strength 5/5 to all LE muscle groups of right lower extremity. HAV with bunion deformity noted b/l LE.   Radiographs: None  Assessment/Plan: 1. Pain due to onychomycosis of nail   2. Tinea pedis of both feet   3. Diabetes mellitus without complication ([place])     Meds ordered this encounter  Medications   ketoconazole (NIZORAL) 2 % cream    Sig: Apply to both feet and between toes once daily for 6 weeks.    Dispense:  60 g    Refill:  1    -Patient's family member present. All questions/concerns addressed on today's visit. -Toenails 1-5 b/l were debrided in length and girth with sterile nail nippers and dremel without iatrogenic bleeding.  -For tinea pedis, Rx sent to pharmacy for Ketoconazole Cream 2% to be applied once daily for six weeks. -Patient/POA to call should there be question/concern in the interim.   Return in about 3 months (around 04/05/2022).  Charles [name], DPM

## 2022-01-08 ENCOUNTER — Encounter: Payer: Self-pay | Admitting: Podiatry

## 2022-02-09 DIAGNOSIS — I739 Peripheral vascular disease, unspecified: Secondary | ICD-10-CM | POA: Diagnosis not present

## 2022-02-09 DIAGNOSIS — I10 Essential (primary) hypertension: Secondary | ICD-10-CM | POA: Diagnosis not present

## 2022-02-09 DIAGNOSIS — E1165 Type 2 diabetes mellitus with hyperglycemia: Secondary | ICD-10-CM | POA: Diagnosis not present

## 2022-02-09 DIAGNOSIS — Z0000 Encounter for general adult medical examination without abnormal findings: Secondary | ICD-10-CM | POA: Diagnosis not present

## 2022-02-09 DIAGNOSIS — Z299 Encounter for prophylactic measures, unspecified: Secondary | ICD-10-CM | POA: Diagnosis not present

## 2022-02-19 ENCOUNTER — Inpatient Hospital Stay: Admit: 2022-02-19 | Payer: Medicare Other | Admitting: Internal Medicine

## 2022-02-19 ENCOUNTER — Encounter (HOSPITAL_COMMUNITY): Payer: Self-pay

## 2022-02-19 DIAGNOSIS — M542 Cervicalgia: Secondary | ICD-10-CM | POA: Diagnosis not present

## 2022-02-19 DIAGNOSIS — R079 Chest pain, unspecified: Secondary | ICD-10-CM | POA: Diagnosis not present

## 2022-02-19 DIAGNOSIS — H5711 Ocular pain, right eye: Secondary | ICD-10-CM | POA: Diagnosis not present

## 2022-02-19 DIAGNOSIS — J9601 Acute respiratory failure with hypoxia: Secondary | ICD-10-CM | POA: Diagnosis not present

## 2022-02-19 DIAGNOSIS — I10 Essential (primary) hypertension: Secondary | ICD-10-CM | POA: Diagnosis not present

## 2022-02-19 DIAGNOSIS — Z885 Allergy status to narcotic agent status: Secondary | ICD-10-CM | POA: Diagnosis not present

## 2022-02-19 DIAGNOSIS — E119 Type 2 diabetes mellitus without complications: Secondary | ICD-10-CM | POA: Diagnosis not present

## 2022-02-19 DIAGNOSIS — R52 Pain, unspecified: Secondary | ICD-10-CM | POA: Diagnosis not present

## 2022-02-19 DIAGNOSIS — R7989 Other specified abnormal findings of blood chemistry: Secondary | ICD-10-CM | POA: Diagnosis not present

## 2022-02-19 DIAGNOSIS — D860 Sarcoidosis of lung: Secondary | ICD-10-CM | POA: Diagnosis not present

## 2022-02-19 DIAGNOSIS — H9190 Unspecified hearing loss, unspecified ear: Secondary | ICD-10-CM | POA: Diagnosis not present

## 2022-02-19 DIAGNOSIS — I69354 Hemiplegia and hemiparesis following cerebral infarction affecting left non-dominant side: Secondary | ICD-10-CM | POA: Diagnosis not present

## 2022-02-19 DIAGNOSIS — Z91041 Radiographic dye allergy status: Secondary | ICD-10-CM | POA: Diagnosis not present

## 2022-02-19 DIAGNOSIS — R519 Headache, unspecified: Secondary | ICD-10-CM | POA: Diagnosis not present

## 2022-02-19 DIAGNOSIS — J384 Edema of larynx: Secondary | ICD-10-CM | POA: Diagnosis not present

## 2022-02-19 DIAGNOSIS — R9431 Abnormal electrocardiogram [ECG] [EKG]: Secondary | ICD-10-CM | POA: Diagnosis not present

## 2022-02-19 DIAGNOSIS — E785 Hyperlipidemia, unspecified: Secondary | ICD-10-CM | POA: Diagnosis not present

## 2022-02-19 DIAGNOSIS — R531 Weakness: Secondary | ICD-10-CM | POA: Diagnosis not present

## 2022-02-19 DIAGNOSIS — R000 Tachycardia, unspecified: Secondary | ICD-10-CM | POA: Diagnosis not present

## 2022-02-19 DIAGNOSIS — I214 Non-ST elevation (NSTEMI) myocardial infarction: Secondary | ICD-10-CM | POA: Diagnosis not present

## 2022-02-20 DIAGNOSIS — E232 Diabetes insipidus: Secondary | ICD-10-CM | POA: Diagnosis not present

## 2022-02-20 DIAGNOSIS — I613 Nontraumatic intracerebral hemorrhage in brain stem: Secondary | ICD-10-CM | POA: Diagnosis not present

## 2022-02-20 DIAGNOSIS — J690 Pneumonitis due to inhalation of food and vomit: Secondary | ICD-10-CM | POA: Diagnosis not present

## 2022-02-20 DIAGNOSIS — J9600 Acute respiratory failure, unspecified whether with hypoxia or hypercapnia: Secondary | ICD-10-CM | POA: Diagnosis not present

## 2022-02-20 DIAGNOSIS — R29736 NIHSS score 36: Secondary | ICD-10-CM | POA: Diagnosis not present

## 2022-02-20 DIAGNOSIS — Z978 Presence of other specified devices: Secondary | ICD-10-CM | POA: Diagnosis not present

## 2022-02-20 DIAGNOSIS — Z66 Do not resuscitate: Secondary | ICD-10-CM | POA: Diagnosis not present

## 2022-02-20 DIAGNOSIS — I469 Cardiac arrest, cause unspecified: Secondary | ICD-10-CM | POA: Diagnosis not present

## 2022-02-20 DIAGNOSIS — I10 Essential (primary) hypertension: Secondary | ICD-10-CM | POA: Diagnosis not present

## 2022-02-20 DIAGNOSIS — R112 Nausea with vomiting, unspecified: Secondary | ICD-10-CM | POA: Diagnosis not present

## 2022-02-20 DIAGNOSIS — G935 Compression of brain: Secondary | ICD-10-CM | POA: Diagnosis not present

## 2022-02-20 DIAGNOSIS — N179 Acute kidney failure, unspecified: Secondary | ICD-10-CM | POA: Diagnosis not present

## 2022-02-20 DIAGNOSIS — E872 Acidosis, unspecified: Secondary | ICD-10-CM | POA: Diagnosis not present

## 2022-02-20 DIAGNOSIS — R1310 Dysphagia, unspecified: Secondary | ICD-10-CM | POA: Diagnosis not present

## 2022-02-20 DIAGNOSIS — I615 Nontraumatic intracerebral hemorrhage, intraventricular: Secondary | ICD-10-CM | POA: Diagnosis not present

## 2022-02-20 DIAGNOSIS — G919 Hydrocephalus, unspecified: Secondary | ICD-10-CM | POA: Diagnosis not present

## 2022-02-20 DIAGNOSIS — G911 Obstructive hydrocephalus: Secondary | ICD-10-CM | POA: Diagnosis not present

## 2022-02-20 DIAGNOSIS — J439 Emphysema, unspecified: Secondary | ICD-10-CM | POA: Diagnosis not present

## 2022-02-20 DIAGNOSIS — E785 Hyperlipidemia, unspecified: Secondary | ICD-10-CM | POA: Diagnosis not present

## 2022-02-20 DIAGNOSIS — Z9181 History of falling: Secondary | ICD-10-CM | POA: Diagnosis not present

## 2022-02-20 DIAGNOSIS — Z4682 Encounter for fitting and adjustment of non-vascular catheter: Secondary | ICD-10-CM | POA: Diagnosis not present

## 2022-02-20 DIAGNOSIS — Z043 Encounter for examination and observation following other accident: Secondary | ICD-10-CM | POA: Diagnosis not present

## 2022-02-20 DIAGNOSIS — J384 Edema of larynx: Secondary | ICD-10-CM | POA: Diagnosis not present

## 2022-02-20 DIAGNOSIS — H9190 Unspecified hearing loss, unspecified ear: Secondary | ICD-10-CM | POA: Diagnosis not present

## 2022-02-20 DIAGNOSIS — R001 Bradycardia, unspecified: Secondary | ICD-10-CM | POA: Diagnosis not present

## 2022-02-20 DIAGNOSIS — R7989 Other specified abnormal findings of blood chemistry: Secondary | ICD-10-CM | POA: Diagnosis not present

## 2022-02-20 DIAGNOSIS — I69354 Hemiplegia and hemiparesis following cerebral infarction affecting left non-dominant side: Secondary | ICD-10-CM | POA: Diagnosis not present

## 2022-02-20 DIAGNOSIS — I462 Cardiac arrest due to underlying cardiac condition: Secondary | ICD-10-CM | POA: Diagnosis not present

## 2022-02-20 DIAGNOSIS — D6832 Hemorrhagic disorder due to extrinsic circulating anticoagulants: Secondary | ICD-10-CM | POA: Diagnosis not present

## 2022-02-20 DIAGNOSIS — E870 Hyperosmolality and hypernatremia: Secondary | ICD-10-CM | POA: Diagnosis not present

## 2022-02-20 DIAGNOSIS — G936 Cerebral edema: Secondary | ICD-10-CM | POA: Diagnosis not present

## 2022-02-20 DIAGNOSIS — J9601 Acute respiratory failure with hypoxia: Secondary | ICD-10-CM | POA: Diagnosis not present

## 2022-02-20 DIAGNOSIS — Z91041 Radiographic dye allergy status: Secondary | ICD-10-CM | POA: Diagnosis not present

## 2022-02-20 DIAGNOSIS — Z7189 Other specified counseling: Secondary | ICD-10-CM | POA: Diagnosis not present

## 2022-02-20 DIAGNOSIS — R402433 Glasgow coma scale score 3-8, at hospital admission: Secondary | ICD-10-CM | POA: Diagnosis not present

## 2022-02-20 DIAGNOSIS — H5711 Ocular pain, right eye: Secondary | ICD-10-CM | POA: Diagnosis not present

## 2022-02-20 DIAGNOSIS — I214 Non-ST elevation (NSTEMI) myocardial infarction: Secondary | ICD-10-CM | POA: Diagnosis not present

## 2022-02-20 DIAGNOSIS — R1110 Vomiting, unspecified: Secondary | ICD-10-CM | POA: Diagnosis not present

## 2022-02-20 DIAGNOSIS — D689 Coagulation defect, unspecified: Secondary | ICD-10-CM | POA: Diagnosis not present

## 2022-02-20 DIAGNOSIS — Z7409 Other reduced mobility: Secondary | ICD-10-CM | POA: Diagnosis not present

## 2022-02-20 DIAGNOSIS — R519 Headache, unspecified: Secondary | ICD-10-CM | POA: Diagnosis not present

## 2022-02-20 DIAGNOSIS — Z452 Encounter for adjustment and management of vascular access device: Secondary | ICD-10-CM | POA: Diagnosis not present

## 2022-02-20 DIAGNOSIS — I350 Nonrheumatic aortic (valve) stenosis: Secondary | ICD-10-CM | POA: Diagnosis not present

## 2022-02-20 DIAGNOSIS — G9389 Other specified disorders of brain: Secondary | ICD-10-CM | POA: Diagnosis not present

## 2022-02-20 DIAGNOSIS — I619 Nontraumatic intracerebral hemorrhage, unspecified: Secondary | ICD-10-CM | POA: Diagnosis not present

## 2022-02-20 DIAGNOSIS — E119 Type 2 diabetes mellitus without complications: Secondary | ICD-10-CM | POA: Diagnosis not present

## 2022-02-20 DIAGNOSIS — I618 Other nontraumatic intracerebral hemorrhage: Secondary | ICD-10-CM | POA: Diagnosis not present

## 2022-02-20 DIAGNOSIS — Z885 Allergy status to narcotic agent status: Secondary | ICD-10-CM | POA: Diagnosis not present

## 2022-02-20 DIAGNOSIS — R918 Other nonspecific abnormal finding of lung field: Secondary | ICD-10-CM | POA: Diagnosis not present

## 2022-02-20 DIAGNOSIS — R9431 Abnormal electrocardiogram [ECG] [EKG]: Secondary | ICD-10-CM | POA: Diagnosis not present

## 2022-02-20 DIAGNOSIS — R531 Weakness: Secondary | ICD-10-CM | POA: Diagnosis not present

## 2022-02-20 DIAGNOSIS — G8194 Hemiplegia, unspecified affecting left nondominant side: Secondary | ICD-10-CM | POA: Diagnosis not present

## 2022-02-20 DIAGNOSIS — R0603 Acute respiratory distress: Secondary | ICD-10-CM | POA: Diagnosis not present

## 2022-02-20 DIAGNOSIS — R9082 White matter disease, unspecified: Secondary | ICD-10-CM | POA: Diagnosis not present

## 2022-02-20 DIAGNOSIS — D860 Sarcoidosis of lung: Secondary | ICD-10-CM | POA: Diagnosis not present

## 2022-02-20 DIAGNOSIS — Z515 Encounter for palliative care: Secondary | ICD-10-CM | POA: Diagnosis not present

## 2022-03-09 ENCOUNTER — Telehealth: Payer: Self-pay | Admitting: Neurology

## 2022-03-09 NOTE — Telephone Encounter (Signed)
Pt has an upcoming xeomin appointment scheduled for 04/07/22 and will need a new PA before appointment. Prior PA expired 02/23/22

## 2022-03-09 NOTE — Telephone Encounter (Signed)
PA needed for [name] 500 units, EMG guided Dx I63.311, Thanks.

## 2022-03-21 NOTE — Telephone Encounter (Signed)
Any updates on PA? Pt is scheduled for injection on 04/07/22

## 2022-03-24 ENCOUNTER — Telehealth: Payer: Self-pay | Admitting: Neurology

## 2022-03-24 NOTE — Telephone Encounter (Signed)
Per pt's brother [name], pt passed away on 2022/03/17

## 2022-03-24 NOTE — Telephone Encounter (Signed)
Patients deceased spoke to his brother Charles [name] today and he confirmed he passed away 03/14/2022.

## 2022-03-24 DEATH — deceased

## 2022-04-06 ENCOUNTER — Ambulatory Visit: Payer: Medicare Other | Admitting: Neurology

## 2022-04-07 ENCOUNTER — Ambulatory Visit: Payer: Medicare Other | Admitting: Neurology

## 2022-04-12 ENCOUNTER — Telehealth: Payer: Self-pay | Admitting: Neurology

## 2022-04-22 ENCOUNTER — Telehealth: Payer: Self-pay | Admitting: Neurology

## 2022-04-22 NOTE — Telephone Encounter (Signed)
Calling to verify dosage on 09/01/21 and 09/02/21 [name]. Would like a call back. Reference no: [id number]

## 2022-05-02 ENCOUNTER — Ambulatory Visit: Payer: Medicare Other | Admitting: Podiatry
